# Patient Record
Sex: Female | Born: 1970 | Race: White | Hispanic: No | Smoking: Current every day smoker
Health system: Southern US, Community
[De-identification: ages and names within clinical notes are randomized; demographics above are authoritative.]

## PROBLEM LIST (undated history)

## (undated) DIAGNOSIS — I10 Essential (primary) hypertension: Secondary | ICD-10-CM

## (undated) DIAGNOSIS — Z973 Presence of spectacles and contact lenses: Secondary | ICD-10-CM

## (undated) DIAGNOSIS — F1021 Alcohol dependence, in remission: Secondary | ICD-10-CM

## (undated) DIAGNOSIS — Z872 Personal history of diseases of the skin and subcutaneous tissue: Secondary | ICD-10-CM

## (undated) DIAGNOSIS — H53009 Unspecified amblyopia, unspecified eye: Secondary | ICD-10-CM

## (undated) DIAGNOSIS — M1990 Unspecified osteoarthritis, unspecified site: Secondary | ICD-10-CM

## (undated) DIAGNOSIS — E46 Unspecified protein-calorie malnutrition: Secondary | ICD-10-CM

## (undated) DIAGNOSIS — F32A Depression, unspecified: Secondary | ICD-10-CM

## (undated) DIAGNOSIS — F419 Anxiety disorder, unspecified: Secondary | ICD-10-CM

## (undated) DIAGNOSIS — Z972 Presence of dental prosthetic device (complete) (partial): Secondary | ICD-10-CM

## (undated) DIAGNOSIS — K219 Gastro-esophageal reflux disease without esophagitis: Secondary | ICD-10-CM

## (undated) DIAGNOSIS — L308 Other specified dermatitis: Secondary | ICD-10-CM

## (undated) DIAGNOSIS — N83209 Unspecified ovarian cyst, unspecified side: Secondary | ICD-10-CM

## (undated) DIAGNOSIS — G629 Polyneuropathy, unspecified: Secondary | ICD-10-CM

## (undated) DIAGNOSIS — M792 Neuralgia and neuritis, unspecified: Secondary | ICD-10-CM

## (undated) DIAGNOSIS — K7460 Unspecified cirrhosis of liver: Secondary | ICD-10-CM

## (undated) DIAGNOSIS — S92309A Fracture of unspecified metatarsal bone(s), unspecified foot, initial encounter for closed fracture: Secondary | ICD-10-CM

## (undated) HISTORY — DX: Unspecified protein-calorie malnutrition: E46

## (undated) HISTORY — DX: Anxiety disorder, unspecified: F41.9

## (undated) HISTORY — PX: ORIF FOOT FRACTURE: SHX2123

## (undated) HISTORY — PX: BREAST BIOPSY: SHX20

## (undated) HISTORY — DX: Unspecified ovarian cyst, unspecified side: N83.209

## (undated) HISTORY — PX: TUBAL LIGATION: SHX77

## (undated) HISTORY — DX: Unspecified amblyopia, unspecified eye: H53.009

## (undated) HISTORY — DX: Other specified dermatitis: L30.8

## (undated) HISTORY — DX: Essential (primary) hypertension: I10

## (undated) HISTORY — DX: Neuralgia and neuritis, unspecified: M79.2

## (undated) HISTORY — DX: Alcohol dependence, in remission: F10.21

## (undated) HISTORY — DX: Unspecified cirrhosis of liver: K74.60

---

## 2000-09-12 HISTORY — PX: ABDOMINAL HYSTERECTOMY: SHX81

## 2000-09-12 HISTORY — PX: TUBAL LIGATION: SHX77

## 2006-07-19 ENCOUNTER — Emergency Department (HOSPITAL_COMMUNITY): Admission: EM | Admit: 2006-07-19 | Discharge: 2006-07-20 | Payer: Self-pay | Admitting: Emergency Medicine

## 2006-07-28 ENCOUNTER — Emergency Department (HOSPITAL_COMMUNITY): Admission: EM | Admit: 2006-07-28 | Discharge: 2006-07-28 | Payer: Self-pay | Admitting: Emergency Medicine

## 2008-03-07 ENCOUNTER — Emergency Department (HOSPITAL_COMMUNITY): Admission: EM | Admit: 2008-03-07 | Discharge: 2008-03-07 | Payer: Self-pay | Admitting: Emergency Medicine

## 2008-03-25 ENCOUNTER — Encounter: Admission: RE | Admit: 2008-03-25 | Discharge: 2008-03-25 | Payer: Self-pay | Admitting: General Surgery

## 2013-11-19 ENCOUNTER — Emergency Department (HOSPITAL_COMMUNITY): Payer: Medicaid Other

## 2013-11-19 ENCOUNTER — Emergency Department (HOSPITAL_COMMUNITY)
Admission: EM | Admit: 2013-11-19 | Discharge: 2013-11-19 | Disposition: A | Discharge status: Home / Self Care | Payer: Medicaid Other | Attending: Emergency Medicine | Admitting: Emergency Medicine

## 2013-11-19 ENCOUNTER — Encounter (HOSPITAL_COMMUNITY): Payer: Self-pay | Admitting: Emergency Medicine

## 2013-11-19 DIAGNOSIS — M79609 Pain in unspecified limb: Secondary | ICD-10-CM | POA: Insufficient documentation

## 2013-11-19 DIAGNOSIS — M7989 Other specified soft tissue disorders: Secondary | ICD-10-CM | POA: Insufficient documentation

## 2013-11-19 DIAGNOSIS — R7309 Other abnormal glucose: Secondary | ICD-10-CM | POA: Insufficient documentation

## 2013-11-19 DIAGNOSIS — R0602 Shortness of breath: Secondary | ICD-10-CM | POA: Insufficient documentation

## 2013-11-19 DIAGNOSIS — Z79899 Other long term (current) drug therapy: Secondary | ICD-10-CM | POA: Insufficient documentation

## 2013-11-19 DIAGNOSIS — R079 Chest pain, unspecified: Secondary | ICD-10-CM | POA: Insufficient documentation

## 2013-11-19 DIAGNOSIS — R030 Elevated blood-pressure reading, without diagnosis of hypertension: Secondary | ICD-10-CM | POA: Insufficient documentation

## 2013-11-19 DIAGNOSIS — R200 Anesthesia of skin: Secondary | ICD-10-CM

## 2013-11-19 DIAGNOSIS — Z3202 Encounter for pregnancy test, result negative: Secondary | ICD-10-CM | POA: Insufficient documentation

## 2013-11-19 DIAGNOSIS — R739 Hyperglycemia, unspecified: Secondary | ICD-10-CM

## 2013-11-19 DIAGNOSIS — R209 Unspecified disturbances of skin sensation: Secondary | ICD-10-CM | POA: Insufficient documentation

## 2013-11-19 DIAGNOSIS — F17200 Nicotine dependence, unspecified, uncomplicated: Secondary | ICD-10-CM | POA: Insufficient documentation

## 2013-11-19 DIAGNOSIS — IMO0001 Reserved for inherently not codable concepts without codable children: Secondary | ICD-10-CM | POA: Insufficient documentation

## 2013-11-19 DIAGNOSIS — E876 Hypokalemia: Secondary | ICD-10-CM | POA: Insufficient documentation

## 2013-11-19 LAB — URINALYSIS, ROUTINE W REFLEX MICROSCOPIC
Bilirubin Urine: NEGATIVE
Glucose, UA: NEGATIVE mg/dL
Hgb urine dipstick: NEGATIVE
Ketones, ur: 15 mg/dL — AB
Nitrite: NEGATIVE
Protein, ur: NEGATIVE mg/dL
Specific Gravity, Urine: 1.033 — ABNORMAL HIGH (ref 1.005–1.030)
Urobilinogen, UA: 1 mg/dL (ref 0.0–1.0)
pH: 6 (ref 5.0–8.0)

## 2013-11-19 LAB — PREGNANCY, URINE: Preg Test, Ur: NEGATIVE

## 2013-11-19 LAB — BASIC METABOLIC PANEL
BUN: 9 mg/dL (ref 6–23)
CO2: 27 mEq/L (ref 19–32)
Calcium: 9 mg/dL (ref 8.4–10.5)
Chloride: 84 mEq/L — ABNORMAL LOW (ref 96–112)
Creatinine, Ser: 0.49 mg/dL — ABNORMAL LOW (ref 0.50–1.10)
GFR calc Af Amer: >90 mL/min (ref >90)
GFR calc non Af Amer: >90 mL/min (ref >90)
Glucose, Bld: 160 mg/dL — ABNORMAL HIGH (ref 70–99)
Potassium: 3.1 mEq/L — ABNORMAL LOW (ref 3.7–5.3)
Sodium: 132 mEq/L — ABNORMAL LOW (ref 137–147)

## 2013-11-19 LAB — D-DIMER, QUANTITATIVE (NOT AT ARMC): D-Dimer, Quant: 0.37 ug/mL-FEU (ref 0.00–0.48)

## 2013-11-19 LAB — URINE MICROSCOPIC-ADD ON

## 2013-11-19 LAB — CBC
HCT: 34.3 % — ABNORMAL LOW (ref 36.0–46.0)
Hemoglobin: 12 g/dL (ref 12.0–15.0)
MCH: 35.9 pg — ABNORMAL HIGH (ref 26.0–34.0)
MCHC: 35 g/dL (ref 30.0–36.0)
MCV: 102.7 fL — ABNORMAL HIGH (ref 78.0–100.0)
Platelets: 257 10*3/uL (ref 150–400)
RBC: 3.34 MIL/uL — ABNORMAL LOW (ref 3.87–5.11)
RDW: 15.6 % — ABNORMAL HIGH (ref 11.5–15.5)
WBC: 17.6 10*3/uL — ABNORMAL HIGH (ref 4.0–10.5)

## 2013-11-19 LAB — I-STAT TROPONIN, ED: Troponin i, poc: 0.01 ng/mL (ref 0.00–0.08)

## 2013-11-19 MED ORDER — GABAPENTIN (ONCE-DAILY) 300 MG PO TABS: 300.0000 mg | ORAL_TABLET | Freq: Once | ORAL | Status: DC

## 2013-11-19 MED ORDER — TRAMADOL HCL 50 MG PO TABS: 50.0000 mg | ORAL_TABLET | Freq: Four times a day (QID) | ORAL | Status: DC | PRN

## 2013-11-19 MED ORDER — POTASSIUM CHLORIDE CRYS ER 20 MEQ PO TBCR
40.0000 meq | EXTENDED_RELEASE_TABLET | Freq: Two times a day (BID) | ORAL | Status: DC
  Administered 2013-11-19: 40 meq via ORAL
  Filled 2013-11-19: qty 2

## 2013-11-19 MED ORDER — MORPHINE SULFATE 4 MG/ML IJ SOLN
6.0000 mg | Freq: Once | INTRAMUSCULAR | Status: AC
  Administered 2013-11-19: 6 mg via INTRAVENOUS
  Filled 2013-11-19: qty 2

## 2013-11-19 NOTE — ED Notes (Signed)
Pt presents with intermittent mid-sternum chest pain x2 weeks, SOB and bilateral leg and foot burning x3 weeks, increase SOB with activity

## 2013-11-19 NOTE — ED Provider Notes (Signed)
CSN: [id number]     Arrival date & time 11/19/13  1225 History   First MD Initiated Contact with Patient 11/19/13 1520     Chief Complaint  Patient presents with  . Chest Pain  . Leg Pain  . Shortness of Breath     (Consider location/radiation/quality/duration/timing/severity/associated sxs/prior Treatment) HPI Pt is a 43yo female with no known significant PMH, however, pt has not seen PCP in 10 years, presenting to ED c/o chest pain, leg pain, and SOB.  Reports leg pain is constant, burning, numb aching sensation, 10/10 in severity, in both feet.  Reports associated swelling of ankles bilaterally over last 2 weeks. Pt also c/o centralized chest pain that is sharp in nature, worse with deep breathing, 6/10, but only when she takes a deep breath.  Pt has also noticed increased SOB over last week with exertion. Denies hx of same. Pt states she is a smoker, 1ppd.  She has tried Aleve for leg pain that provided minimal relief. Denies SOB at rest. Denies hx of asthma as a child. Denies diaphoresis, fever, n/v/d. Denies abdominal pain, urinary or vaginal symptoms. Reports having her tubes tied, pt is not on birth control. Denies hx of blood clots or long travel. Denies unilateral leg pain or swelling, symptoms are equal in both legs.  Reports family hx of heart disease, her uncle had an MI before age of 37yo.  No hx of DM. Pt does not take any daily medications or supplements.   History reviewed. No pertinent past medical history. History reviewed. No pertinent past surgical history. History reviewed. No pertinent family history. History  Substance Use Topics  . Smoking status: Current Every Day Smoker  . Smokeless tobacco: Not on file  . Alcohol Use: Yes   OB History   Grav Para Term Preterm Abortions TAB SAB Ect Mult Living                 Review of Systems  Constitutional: Negative for fever and chills.  Respiratory: Positive for shortness of breath. Negative for wheezing and stridor.    Cardiovascular: Positive for chest pain and leg swelling. Negative for palpitations.  Gastrointestinal: Negative for nausea, vomiting and abdominal pain.  Musculoskeletal: Positive for myalgias. Negative for back pain, neck pain and neck stiffness.  Skin: Negative for color change.  Neurological: Positive for numbness ( bottom of both feet).  All other systems reviewed and are negative.      Allergies  Review of patient's allergies indicates no known allergies.  Home Medications   Current Outpatient Rx  Name  Route  Sig  Dispense  Refill  . Multiple Vitamins-Minerals (MULTIVITAMIN WITH MINERALS) tablet   Oral   Take 1 tablet by mouth daily.         . naproxen sodium (ANAPROX) 220 MG tablet   Oral   Take 220 mg by mouth 2 (two) times daily as needed (for pain).         . Gabapentin, PHN, 300 MG TABS   Oral   Take 300 mg by mouth once. Day 1: 300 mg (1 tab), Day 2: 300 mg twice daily, Day 3: 300 mg 3 times daily; dose may be titrated as needed for pain relief (range: 1,800 to 3,600 mg/day)   20 tablet   0   . traMADol (ULTRAM) 50 MG tablet   Oral   Take 1 tablet (50 mg total) by mouth every 6 (six) hours as needed.   15 tablet   0  BP 163/102  Pulse 89  Temp(Src) 97.7 F (36.5 C) (Oral)  Resp 13  Wt 123 lb 1.6 oz (55.838 kg)  SpO2 100%  LMP 10/22/2013 Physical Exam  Nursing note and vitals reviewed. Constitutional: She is oriented to person, place, and time. She appears well-developed and well-nourished. No distress.  Pt lying comfortably in exam bed, NAD.   HENT:  Head: Normocephalic and atraumatic.  Eyes: Conjunctivae and EOM are normal. Pupils are equal, round, and reactive to light. No scleral icterus.  Neck: Normal range of motion. Neck supple.  Cardiovascular: Normal rate, regular rhythm and normal heart sounds.   Pedal pulse 2+   Pulmonary/Chest: Effort normal and breath sounds normal. No respiratory distress. She has no wheezes. She has no rales.  She exhibits no tenderness.  No respiratory distress, able to speak in full sentences w/o difficulty. Lungs: CTAB. No chest wall tenderness  Abdominal: Soft. Bowel sounds are normal. She exhibits no distension and no mass. There is no tenderness. There is no rebound and no guarding.  Musculoskeletal: Normal range of motion. She exhibits tenderness. She exhibits no edema.  Neurological: She is alert and oriented to person, place, and time. She has normal strength. No cranial nerve deficit. She displays a negative Romberg sign. Coordination normal.  CN II-XII grossly in tact, negative romberg. Normal sensation to light and sharp touch on face.  Feet-hypersensitivity to bottom of feet bilaterally.  No erythema, warmth, or edema.   Skin: Skin is warm and dry. She is not diaphoretic. No erythema.    ED Course  Procedures (including critical care time) Labs Review Labs Reviewed  CBC - Abnormal; Notable for the following:    WBC 17.6 (*)    RBC 3.34 (*)    HCT 34.3 (*)    MCV 102.7 (*)    MCH 35.9 (*)    RDW 15.6 (*)    All other components within normal limits  BASIC METABOLIC PANEL - Abnormal; Notable for the following:    Sodium 132 (*)    Potassium 3.1 (*)    Chloride 84 (*)    Glucose, Bld 160 (*)    Creatinine, Ser 0.49 (*)    All other components within normal limits  URINALYSIS, ROUTINE W REFLEX MICROSCOPIC - Abnormal; Notable for the following:    Color, Urine AMBER (*)    APPearance TURBID (*)    Specific Gravity, Urine 1.033 (*)    Ketones, ur 15 (*)    Leukocytes, UA SMALL (*)    All other components within normal limits  URINE MICROSCOPIC-ADD ON - Abnormal; Notable for the following:    Squamous Epithelial / LPF FEW (*)    All other components within normal limits  D-DIMER, QUANTITATIVE  PREGNANCY, URINE  I-STAT TROPOININ, ED   Imaging Review Dg Chest 2 View  11/19/2013   CLINICAL DATA Chest pain.  EXAM CHEST  2 VIEW  COMPARISON None.  FINDINGS No cardiomegaly.  Unremarkable mediastinal contours. No infiltrate, edema, effusion, or pneumothorax. There is an apparent 22 mm long lucency in the posterior right fifth rib. This could be related to a true expansile bone lesion or summation of lung markings.  IMPRESSION 1. No active cardiopulmonary disease. 2. Questionable expansile bone lesion in the posterior right fifth rib. Recommend dedicated rib series for confirmation (would like to exclude CT radiation due to young age).  SIGNATURE  Electronically Signed   By: Jorje Guild M.D.   On: 11/19/2013 13:27     EKG  Interpretation None      MDM   Final diagnoses:  Elevated blood pressure  Hyperglycemia  Numbness in feet  Chest pain  SOB (shortness of breath)  Hypokalemia    Pt is a 43yo female presenting with multiple complaints, specifically chest pain, SOB, and bilateral leg pain x2-3 weeks. Pt states she has not seen a PCP in 10 years. Denies known significant PMH. Does not take any daily medication. Denies recent illness including fever, n/v/d. Pt is a smoker, denies hx of asthma.  DDx: ACS, PE, Pneumonia, HTN, hyperglycemia due to c/o bilateral foot numbness. Vitals: afebrile, elevated BP, O2-100% on RA, HR and RR normal.  Labs-CBC: elevated WBC-17.6 BMP-hypokalemia: 3.1, hyperglycemia-160 (pt has not eaten today) UA: unremarkable CXR: no active cardiopulmonary disease  D-dimer: unremarkable.  Do not believe emergent process taking place at this time. Discussed importance of f/u with PCP, resource guide provided.  Advised pt needs to discuss with PCP elevated BP and hyperglycemia.  All labs/imaging/findings discussed with patient. All questions answered and concerns addressed. Will discharge pt home and have pt f/u with PCP. Return precautions given. Rx: gabapentin and tramadol given for foot pain. Pt verbalized understanding and agreement with tx plan. Vitals: unremarkable. Discharged in stable condition.    Discussed pt with attending during  ED encounter and agrees with plan.        Noland Fordyce, PA-C 11/20/13 [phone]

## 2013-11-19 NOTE — ED Notes (Signed)
Patient transported to X-ray 

## 2013-11-19 NOTE — Discharge Instructions (Signed)
Emergency Department Resource Guide 1) Find a Doctor and Pay Out of Pocket Although you won't have to find out who is covered by your insurance plan, it is a good idea to ask around and get recommendations. You will then need to call the office and see if the doctor you have chosen will accept you as a new patient and what types of options they offer for patients who are self-pay. Some doctors offer discounts or will set up payment plans for their patients who do not have insurance, but you will need to ask so you aren't surprised when you get to your appointment.  2) Contact Your Local Health Department Not all health departments have doctors that can see patients for sick visits, but many do, so it is worth a call to see if yours does. If you don't know where your local health department is, you can check in your phone book. The CDC also has a tool to help you locate your state's health department, and many state websites also have listings of all of their local health departments.  3) Find a [facility] If your illness is not likely to be very severe or complicated, you may want to try a walk in clinic. These are popping up all over the country in pharmacies, drugstores, and shopping centers. They're usually staffed by nurse practitioners or physician assistants that have been trained to treat common illnesses and complaints. They're usually fairly quick and inexpensive. However, if you have serious medical issues or chronic medical problems, these are probably not your best option.  No Primary Care Doctor: - Call Health Connect at  [phone] - they can help you locate a primary care doctor that  accepts your insurance, provides certain services, etc. - Physician Referral Service- [phone]  Chronic Pain Problems: Organization         Address  Phone   Notes  [facility]  [phone] Patients need to be referred by their primary care doctor.   Medication  Assistance: Organization         Address  Phone   Notes  [facility] Medication [facility] [address] [place], [place] [address] [phone] --Must be a resident of [facility] -- Must have NO insurance coverage whatsoever (no Medicaid/ Medicare, etc.) -- The pt. MUST have a primary care doctor that directs their care regularly and follows them in the community   [facility]  [phone]   [employer]  [phone]    Agencies that provide inexpensive medical care: Organization         Address                                                       Phone                                                                            Notes  [place]  [phone]   [facility]    [phone])  [facility] [place], [place] [address] [phone]   [place]. [address], [place] [phone]   [facility]    [phone]   [facility]    [phone]   [place] and [place] [address], [place] Phone:  [phone], Fax:  [phone] Hours of Operation:  9 am - 6 pm, M-F.  Also accepts Medicaid/Medicare and self-pay.  [facility] for [address] [place], [address], [place] Phone: [phone], Fax: [phone]. Hours of Operation:  8:30 am - 5:30 pm, M-F.  Also accepts Medicaid and self-pay.  [facility] [address], [place] Phone: [phone]   [place], [place], [place] [phone], Ext. [phone] Mondays & Thursdays: 7-9 AM.  First 15 patients are seen on a first come, first serve basis.    [place] Providers:  Organization         Address                                                                       Phone                               Notes  [place] [facility] [address], [place] [phone] Also accepts self-pay patients.  [facility] [address], [place]  [phone]   [place], [address], [place] [phone]   [facility] [address] [place] [phone]   Lucianne Lei [address], [place]   [phone] Only accepts [place] Access [place] patients after they have their name applied to their card.   Self-Pay (no insurance) in [facility]:   Organization         Address                                                     Phone               Notes  Sickle Cell Patients, [facility] [place] [phone]   [facility] [place] [phone]   [facility] [place]  [place], [place], [place] [phone]   [facility]/Dr. Osei-Bonsu  [address] [place] or [address], [place] [phone] Phone number for both [place] and [place] locations is the same.  [facility]gent [facility] and [facility] [address] [place] [phone]   [facility] [address] [place] or [address] [phone]   [facility] [place], [place] [phone], phone; [phone], fax Sees patients 1st and 3rd Saturday of  every month.  Must not qualify for public or private insurance (i.e. Medicaid, Medicare, [place] Health Choice, Veterans' Benefits)  Household income should be no more than 200% of the poverty level The clinic cannot treat you if you are pregnant or think you are pregnant  Sexually transmitted diseases are not treated at the clinic.    Dental Care: Organization         Address                                  Phone                       Notes  [facility] Department of [facility] [place] [phone] Accepts children up to age 9 who are enrolled in [place] or [place]; pregnant women with a Medicaid card; and children who have applied for Medicaid or [place] Health Choice, but were declined, whose parents can pay a reduced fee at time of service.  [facility] Department of [facility]  [address], [place] [phone] Accepts children up to age 24 who are enrolled in [place] or [place]; pregnant women with a Medicaid card; and children who have applied for Medicaid or [place] Health Choice, but were declined, whose parents can pay a reduced fee at time of service.  [place] [facility] Access PRO[facility]  [place] [phone] Patients are seen by appointment only. Walk-ins are not accepted. [place] will see patients 45 years of age and older. Monday - Tuesday (8am-5pm) Most Wednesdays (8:30-5pm) $30 per visit, cash only  [facility] Adult Dental Access PROGRAM  [address], [facility] [phone] Patients are seen by appointment only. Walk-ins are not accepted. [place] will see patients 27 years of age and older. One Wednesday Evening (Monthly: Volunteer Based).  $30 per visit, cash only  [place]  [phone] for adults; Children under age 15, call Graduate Pediatric Dentistry at [phone]. Children aged 75-14, please call [phone] to request a pediatric application.  Dental services are provided in all areas of dental care including fillings, crowns and bridges, complete and partial dentures, implants, gum treatment, root canals, and extractions. Preventive care is also provided. Treatment is provided to both adults and children. Patients are selected via a lottery and there is often a waiting list.   [facility] [address], [place]  [phone] [email or web address]   [facility] [address] [place], [place] [phone], Ext. [phone] Second and Fourth Thursday of each month, opens at 6:30 AM; Clinic ends at 9 AM.  Patients are seen on a first-come first-served basis, and a limited number are seen during each clinic.   [facility]  [address] [place], [place] [phone]   Eligibility Requirements You must have lived in [place], [place], or [place] counties for at least the last three months.   You cannot be eligible for state or federal sponsored [employer], including [employer], [place], or [employer].   You generally cannot be eligible for healthcare insurance through your employer.    How to apply: Eligibility screenings are held every Tuesday and Wednesday afternoon from 1:00 pm until 4:00 pm. You do not need an appointment for the interview!  [facility] [address], [place], [place] [phone]   °[facility]  [phone]   °[facility]  [phone]   ° [facility]  [phone]   ° °

## 2013-11-19 NOTE — ED Notes (Signed)
Erin, PA at bedside. 

## 2013-11-24 NOTE — ED Provider Notes (Signed)
Medical screening examination/treatment/procedure(s) were performed by non-physician practitioner and as supervising physician I was immediately available for consultation/collaboration.   EKG Interpretation   Date/Time:  Tuesday November 19 2013 12:34:00 EDT Ventricular Rate:  89 PR Interval:  114 QRS Duration: 86 QT Interval:  404 QTC Calculation: 491 R Axis:   95 Text Interpretation:  Normal sinus rhythm Rightward axis ST \\T \ T wave  abnormality, consider inferior ischemia ST \\T \ T wave abnormality,  consider anterolateral ischemia Prolonged QT Reconfirmed by Alvino Chapel  MD,  Evolet Salminen [phone]) on 11/23/2013 7:05:36 AM       Jasper Riling. Alvino Chapel, MD 11/24/13 1517

## 2013-11-29 ENCOUNTER — Encounter (HOSPITAL_COMMUNITY): Payer: Self-pay | Admitting: Emergency Medicine

## 2013-11-29 ENCOUNTER — Emergency Department (HOSPITAL_COMMUNITY)
Admission: EM | Admit: 2013-11-29 | Discharge: 2013-11-29 | Disposition: A | Discharge status: Home / Self Care | Payer: No Typology Code available for payment source | Source: Home / Self Care | Attending: Family Medicine | Admitting: Family Medicine

## 2013-11-29 DIAGNOSIS — M79609 Pain in unspecified limb: Secondary | ICD-10-CM

## 2013-11-29 DIAGNOSIS — M79606 Pain in leg, unspecified: Secondary | ICD-10-CM

## 2013-11-29 LAB — POCT I-STAT, CHEM 8
BUN: 4 mg/dL — ABNORMAL LOW (ref 6–23)
Calcium, Ion: 1.12 mmol/L (ref 1.12–1.23)
Chloride: 89 mEq/L — ABNORMAL LOW (ref 96–112)
Creatinine, Ser: 0.6 mg/dL (ref 0.50–1.10)
Glucose, Bld: 116 mg/dL — ABNORMAL HIGH (ref 70–99)
HCT: 43 % (ref 36.0–46.0)
Hemoglobin: 14.6 g/dL (ref 12.0–15.0)
Potassium: 3.7 mEq/L (ref 3.7–5.3)
Sodium: 132 mEq/L — ABNORMAL LOW (ref 137–147)
TCO2: 30 mmol/L (ref 0–100)

## 2013-11-29 MED ORDER — HYDROCODONE-ACETAMINOPHEN 5-325 MG PO TABS: 2.0000 | ORAL_TABLET | ORAL | Status: DC | PRN

## 2013-11-29 NOTE — ED Provider Notes (Signed)
CSN: [id number]     Arrival date & time 11/29/13  1516 History   First MD Initiated Contact with Patient 11/29/13 1714     Chief Complaint  Patient presents with  . Leg Pain   (Consider location/radiation/quality/duration/timing/severity/associated sxs/prior Treatment) Patient is a 43 y.o. female presenting with leg pain. The history is provided by the patient. No language interpreter was used.  Leg Pain Location:  Leg Injury: no   Leg location:  L leg and R leg Pain details:    Quality:  Aching   Radiates to:  Does not radiate   Severity:  No pain   Onset quality:  Gradual   Duration:  3 weeks   Timing:  Constant   Progression:  Worsening Chronicity:  New Dislocation: no   Tetanus status:  Out of date Prior injury to area:  No Worsened by:  Nothing tried Ineffective treatments:  None tried Associated symptoms: swelling and tingling     History reviewed. No pertinent past medical history. History reviewed. No pertinent past surgical history. History reviewed. No pertinent family history. History  Substance Use Topics  . Smoking status: Current Every Day Smoker  . Smokeless tobacco: Not on file  . Alcohol Use: Yes   OB History   Grav Para Term Preterm Abortions TAB SAB Ect Mult Living                 Review of Systems  Musculoskeletal: Positive for joint swelling and myalgias.  All other systems reviewed and are negative.    Allergies  Review of patient's allergies indicates no known allergies.  Home Medications   Current Outpatient Rx  Name  Route  Sig  Dispense  Refill  . Gabapentin, PHN, 300 MG TABS   Oral   Take 300 mg by mouth once. Day 1: 300 mg (1 tab), Day 2: 300 mg twice daily, Day 3: 300 mg 3 times daily; dose may be titrated as needed for pain relief (range: 1,800 to 3,600 mg/day)   20 tablet   0   . HYDROcodone-acetaminophen (NORCO/VICODIN) 5-325 MG per tablet   Oral   Take 2 tablets by mouth every 4 (four) hours as needed for moderate  pain.   20 tablet   0   . Multiple Vitamins-Minerals (MULTIVITAMIN WITH MINERALS) tablet   Oral   Take 1 tablet by mouth daily.         . naproxen sodium (ANAPROX) 220 MG tablet   Oral   Take 220 mg by mouth 2 (two) times daily as needed (for pain).         . traMADol (ULTRAM) 50 MG tablet   Oral   Take 1 tablet (50 mg total) by mouth every 6 (six) hours as needed.   15 tablet   0    BP 171/101  Pulse 95  Temp(Src) 98.2 F (36.8 C) (Oral)  Resp 16  SpO2 100%  LMP 11/03/2013 Physical Exam  Constitutional: She appears well-developed and well-nourished.  HENT:  Head: Normocephalic and atraumatic.  Eyes: Pupils are equal, round, and reactive to light.  Neck: Normal range of motion.  Cardiovascular: Normal rate and normal heart sounds.   Pulmonary/Chest: Effort normal.  Abdominal: Soft.  Musculoskeletal: She exhibits tenderness.  Neurological: She is alert.  Skin: Skin is warm.    ED Course  Procedures (including critical care time) Labs Review Labs Reviewed  POCT I-STAT, CHEM 8 - Abnormal; Notable for the following:    Sodium 132 (*)  Chloride 89 (*)    BUN 4 (*)    Glucose, Bld 116 (*)    All other components within normal limits   Imaging Review No results found.   MDM   1. Leg pain    Pt given rx for hydrocodone for pain.   Pt advised to follow up with Wellness center    Fransico Meadow, [place] 11/29/13 1827   Medical screening examination/treatment/procedure(s) were performed by a resident physician or non-physician practitioner and as the supervising physician I was immediately available for consultation/collaboration.  Lynne Leader, MD    Gregor Hams, MD 11/30/13 [phone]

## 2013-11-29 NOTE — ED Notes (Signed)
Pt  Reports  Symptoms  Of  Leg    Pain         And   Swelling  Of  Both feet  As  Well   As  Pain  When  She  Bears  Weight            She  Was  Seen  Er  10  Days  Ago  And  Had  A    Work  Up         Pt  Reports  Feet  Continue  To        Cause  Her  Pain pt  States  She  Received  ornga  Card    Today  And   Her plan is  To  followup  With  [employer]

## 2013-11-29 NOTE — Discharge Instructions (Signed)

## 2013-12-18 ENCOUNTER — Inpatient Hospital Stay (HOSPITAL_COMMUNITY): Payer: Medicaid Other

## 2013-12-18 ENCOUNTER — Inpatient Hospital Stay (HOSPITAL_COMMUNITY)
Admission: EM | Admit: 2013-12-18 | Discharge: 2013-12-27 | DRG: 041 | Disposition: A | Discharge status: Skilled Nursing Facility | Payer: Medicaid Other | Attending: Internal Medicine | Admitting: Internal Medicine

## 2013-12-18 ENCOUNTER — Encounter: Payer: Self-pay | Admitting: Internal Medicine

## 2013-12-18 ENCOUNTER — Ambulatory Visit: Payer: No Typology Code available for payment source | Attending: Internal Medicine | Admitting: Internal Medicine

## 2013-12-18 ENCOUNTER — Encounter (HOSPITAL_COMMUNITY): Payer: Self-pay | Admitting: Emergency Medicine

## 2013-12-18 VITALS — BP 160/103 | HR 88 | Temp 97.5°F | Resp 16

## 2013-12-18 DIAGNOSIS — G629 Polyneuropathy, unspecified: Secondary | ICD-10-CM

## 2013-12-18 DIAGNOSIS — I252 Old myocardial infarction: Secondary | ICD-10-CM

## 2013-12-18 DIAGNOSIS — K0530 Chronic periodontitis, unspecified: Secondary | ICD-10-CM | POA: Diagnosis present

## 2013-12-18 DIAGNOSIS — M79604 Pain in right leg: Secondary | ICD-10-CM

## 2013-12-18 DIAGNOSIS — M270 Developmental disorders of jaws: Secondary | ICD-10-CM | POA: Diagnosis present

## 2013-12-18 DIAGNOSIS — I2789 Other specified pulmonary heart diseases: Secondary | ICD-10-CM | POA: Diagnosis present

## 2013-12-18 DIAGNOSIS — L0201 Cutaneous abscess of face: Secondary | ICD-10-CM | POA: Diagnosis present

## 2013-12-18 DIAGNOSIS — K0401 Reversible pulpitis: Secondary | ICD-10-CM | POA: Diagnosis present

## 2013-12-18 DIAGNOSIS — M79605 Pain in left leg: Secondary | ICD-10-CM

## 2013-12-18 DIAGNOSIS — K045 Chronic apical periodontitis: Secondary | ICD-10-CM | POA: Diagnosis present

## 2013-12-18 DIAGNOSIS — D72829 Elevated white blood cell count, unspecified: Secondary | ICD-10-CM | POA: Diagnosis present

## 2013-12-18 DIAGNOSIS — I10 Essential (primary) hypertension: Secondary | ICD-10-CM | POA: Diagnosis present

## 2013-12-18 DIAGNOSIS — G609 Hereditary and idiopathic neuropathy, unspecified: Secondary | ICD-10-CM

## 2013-12-18 DIAGNOSIS — G608 Other hereditary and idiopathic neuropathies: Secondary | ICD-10-CM | POA: Insufficient documentation

## 2013-12-18 DIAGNOSIS — K029 Dental caries, unspecified: Secondary | ICD-10-CM | POA: Diagnosis present

## 2013-12-18 DIAGNOSIS — Z79899 Other long term (current) drug therapy: Secondary | ICD-10-CM

## 2013-12-18 DIAGNOSIS — M79609 Pain in unspecified limb: Secondary | ICD-10-CM

## 2013-12-18 DIAGNOSIS — R6510 Systemic inflammatory response syndrome (SIRS) of non-infectious origin without acute organ dysfunction: Secondary | ICD-10-CM | POA: Diagnosis present

## 2013-12-18 DIAGNOSIS — E860 Dehydration: Secondary | ICD-10-CM

## 2013-12-18 DIAGNOSIS — M264 Malocclusion, unspecified: Secondary | ICD-10-CM | POA: Diagnosis present

## 2013-12-18 DIAGNOSIS — E871 Hypo-osmolality and hyponatremia: Secondary | ICD-10-CM

## 2013-12-18 DIAGNOSIS — F17200 Nicotine dependence, unspecified, uncomplicated: Secondary | ICD-10-CM | POA: Diagnosis present

## 2013-12-18 DIAGNOSIS — L03211 Cellulitis of face: Secondary | ICD-10-CM

## 2013-12-18 DIAGNOSIS — K7689 Other specified diseases of liver: Secondary | ICD-10-CM | POA: Diagnosis present

## 2013-12-18 DIAGNOSIS — F1010 Alcohol abuse, uncomplicated: Secondary | ICD-10-CM | POA: Diagnosis present

## 2013-12-18 DIAGNOSIS — K083 Retained dental root: Secondary | ICD-10-CM | POA: Diagnosis present

## 2013-12-18 DIAGNOSIS — Z23 Encounter for immunization: Secondary | ICD-10-CM

## 2013-12-18 DIAGNOSIS — I509 Heart failure, unspecified: Secondary | ICD-10-CM | POA: Diagnosis present

## 2013-12-18 DIAGNOSIS — K047 Periapical abscess without sinus: Secondary | ICD-10-CM | POA: Diagnosis present

## 2013-12-18 DIAGNOSIS — D638 Anemia in other chronic diseases classified elsewhere: Secondary | ICD-10-CM | POA: Diagnosis present

## 2013-12-18 LAB — CBC
HCT: 25.8 % — ABNORMAL LOW (ref 36.0–46.0)
Hemoglobin: 8.8 g/dL — ABNORMAL LOW (ref 12.0–15.0)
MCH: 33.7 pg (ref 26.0–34.0)
MCHC: 34.1 g/dL (ref 30.0–36.0)
MCV: 98.9 fL (ref 78.0–100.0)
Platelets: 366 10*3/uL (ref 150–400)
RBC: 2.61 MIL/uL — ABNORMAL LOW (ref 3.87–5.11)
RDW: 15.3 % (ref 11.5–15.5)
WBC: 22.2 10*3/uL — ABNORMAL HIGH (ref 4.0–10.5)

## 2013-12-18 LAB — COMPREHENSIVE METABOLIC PANEL
ALT: 8 U/L (ref 0–35)
AST: 49 U/L — ABNORMAL HIGH (ref 0–37)
Albumin: 2.6 g/dL — ABNORMAL LOW (ref 3.5–5.2)
Alkaline Phosphatase: 183 U/L — ABNORMAL HIGH (ref 39–117)
BUN: 9 mg/dL (ref 6–23)
CO2: 24 mEq/L (ref 19–32)
Calcium: 8.4 mg/dL (ref 8.4–10.5)
Chloride: 86 mEq/L — ABNORMAL LOW (ref 96–112)
Creatinine, Ser: 0.67 mg/dL (ref 0.50–1.10)
GFR calc Af Amer: >90 mL/min (ref >90)
GFR calc non Af Amer: >90 mL/min (ref >90)
Glucose, Bld: 104 mg/dL — ABNORMAL HIGH (ref 70–99)
Potassium: 3.7 mEq/L (ref 3.7–5.3)
Sodium: 129 mEq/L — ABNORMAL LOW (ref 137–147)
Total Bilirubin: 0.6 mg/dL (ref 0.3–1.2)
Total Protein: 6.1 g/dL (ref 6.0–8.3)

## 2013-12-18 LAB — CBC WITH DIFFERENTIAL/PLATELET
Basophils Absolute: 0.1 10*3/uL (ref 0.0–0.1)
Basophils Relative: 0 % (ref 0–1)
Eosinophils Absolute: 0.1 10*3/uL (ref 0.0–0.7)
Eosinophils Relative: 0 % (ref 0–5)
HCT: 28.7 % — ABNORMAL LOW (ref 36.0–46.0)
Hemoglobin: 10.2 g/dL — ABNORMAL LOW (ref 12.0–15.0)
Lymphocytes Relative: 11 % — ABNORMAL LOW (ref 12–46)
Lymphs Abs: 2.8 10*3/uL (ref 0.7–4.0)
MCH: 35.1 pg — ABNORMAL HIGH (ref 26.0–34.0)
MCHC: 35.5 g/dL (ref 30.0–36.0)
MCV: 98.6 fL (ref 78.0–100.0)
Monocytes Absolute: 2.1 10*3/uL — ABNORMAL HIGH (ref 0.1–1.0)
Monocytes Relative: 9 % (ref 3–12)
Neutro Abs: 19.2 10*3/uL — ABNORMAL HIGH (ref 1.7–7.7)
Neutrophils Relative %: 80 % — ABNORMAL HIGH (ref 43–77)
Platelets: 397 10*3/uL (ref 150–400)
RBC: 2.91 MIL/uL — ABNORMAL LOW (ref 3.87–5.11)
RDW: 15.4 % (ref 11.5–15.5)
WBC: 24.2 10*3/uL — ABNORMAL HIGH (ref 4.0–10.5)

## 2013-12-18 LAB — SEDIMENTATION RATE: Sed Rate: 5 mm/hr (ref 0–22)

## 2013-12-18 MED ORDER — CLINDAMYCIN PHOSPHATE 600 MG/50ML IV SOLN
600.0000 mg | Freq: Once | INTRAVENOUS | Status: AC
  Administered 2013-12-18: 600 mg via INTRAVENOUS
  Filled 2013-12-18: qty 50

## 2013-12-18 MED ORDER — HEPARIN SODIUM (PORCINE) 5000 UNIT/ML IJ SOLN
5000.0000 [IU] | Freq: Three times a day (TID) | INTRAMUSCULAR | Status: DC
  Administered 2013-12-18 – 2013-12-22 (×12): 5000 [IU] via SUBCUTANEOUS
  Filled 2013-12-18 (×14): qty 1

## 2013-12-18 MED ORDER — PNEUMOCOCCAL VAC POLYVALENT 25 MCG/0.5ML IJ INJ
0.5000 mL | INJECTION | INTRAMUSCULAR | Status: AC
  Administered 2013-12-19: 0.5 mL via INTRAMUSCULAR
  Filled 2013-12-18: qty 0.5

## 2013-12-18 MED ORDER — SODIUM CHLORIDE 0.9 % IV SOLN
INTRAVENOUS | Status: AC
  Administered 2013-12-18 – 2013-12-19 (×2): via INTRAVENOUS

## 2013-12-18 MED ORDER — IOHEXOL 300 MG/ML  SOLN
80.0000 mL | Freq: Once | INTRAMUSCULAR | Status: AC | PRN
  Administered 2013-12-18: 80 mL via INTRAVENOUS

## 2013-12-18 MED ORDER — MORPHINE SULFATE 2 MG/ML IJ SOLN
2.0000 mg | INTRAMUSCULAR | Status: DC | PRN
  Administered 2013-12-18 – 2013-12-22 (×19): 2 mg via INTRAVENOUS
  Filled 2013-12-18 (×19): qty 1

## 2013-12-18 MED ORDER — HYDROCODONE-ACETAMINOPHEN 5-325 MG PO TABS
2.0000 | ORAL_TABLET | ORAL | Status: DC | PRN
  Administered 2013-12-18 – 2013-12-27 (×27): 2 via ORAL
  Filled 2013-12-18 (×29): qty 2

## 2013-12-18 MED ORDER — ONDANSETRON HCL 4 MG/2ML IJ SOLN: 4.0000 mg | Freq: Four times a day (QID) | INTRAMUSCULAR | Status: DC | PRN

## 2013-12-18 MED ORDER — POLYETHYLENE GLYCOL 3350 17 G PO PACK
17.0000 g | PACK | Freq: Every day | ORAL | Status: DC | PRN
  Filled 2013-12-18: qty 1

## 2013-12-18 MED ORDER — VANCOMYCIN HCL IN DEXTROSE 750-5 MG/150ML-% IV SOLN
750.0000 mg | Freq: Two times a day (BID) | INTRAVENOUS | Status: DC
  Administered 2013-12-19 – 2013-12-22 (×6): 750 mg via INTRAVENOUS
  Filled 2013-12-18 (×9): qty 150

## 2013-12-18 MED ORDER — ONDANSETRON HCL 4 MG/2ML IJ SOLN
4.0000 mg | Freq: Once | INTRAMUSCULAR | Status: AC
  Administered 2013-12-18: 4 mg via INTRAVENOUS
  Filled 2013-12-18: qty 2

## 2013-12-18 MED ORDER — WHITE PETROLATUM GEL
Status: AC
  Administered 2013-12-18: 0.2
  Filled 2013-12-18: qty 5

## 2013-12-18 MED ORDER — GABAPENTIN 300 MG PO CAPS
300.0000 mg | ORAL_CAPSULE | Freq: Once | ORAL | Status: AC
Start: 2013-12-18 — End: 2013-12-18
  Administered 2013-12-18: 300 mg via ORAL
  Filled 2013-12-18: qty 1

## 2013-12-18 MED ORDER — ONDANSETRON HCL 4 MG PO TABS
4.0000 mg | ORAL_TABLET | Freq: Four times a day (QID) | ORAL | Status: DC | PRN
  Administered 2013-12-19: 4 mg via ORAL
  Filled 2013-12-18: qty 1

## 2013-12-18 MED ORDER — ACETAMINOPHEN 650 MG RE SUPP: 650.0000 mg | Freq: Four times a day (QID) | RECTAL | Status: DC | PRN

## 2013-12-18 MED ORDER — SODIUM CHLORIDE 0.9 % IV BOLUS (SEPSIS)
1000.0000 mL | Freq: Once | INTRAVENOUS | Status: AC
  Administered 2013-12-18: 1000 mL via INTRAVENOUS

## 2013-12-18 MED ORDER — MORPHINE SULFATE 4 MG/ML IJ SOLN
6.0000 mg | Freq: Once | INTRAMUSCULAR | Status: AC
  Administered 2013-12-18: 6 mg via INTRAVENOUS
  Filled 2013-12-18: qty 2

## 2013-12-18 MED ORDER — VANCOMYCIN HCL IN DEXTROSE 1-5 GM/200ML-% IV SOLN
1000.0000 mg | INTRAVENOUS | Status: AC
  Administered 2013-12-18: 1000 mg via INTRAVENOUS
  Filled 2013-12-18: qty 200

## 2013-12-18 MED ORDER — ACETAMINOPHEN 325 MG PO TABS: 650.0000 mg | ORAL_TABLET | Freq: Four times a day (QID) | ORAL | Status: DC | PRN

## 2013-12-18 NOTE — Patient Instructions (Signed)
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

## 2013-12-18 NOTE — Progress Notes (Signed)
Pt is here to establish care. Pt was admitted to the hospital last month with swelling in her legs and feet causing her to be unable to walk.

## 2013-12-18 NOTE — ED Provider Notes (Signed)
CSN: [id number]     Arrival date & time 12/18/13  1248 History   First MD Initiated Contact with Patient 12/18/13 1514     Chief Complaint  Patient presents with  . Abscess  . Leg Swelling     (Consider location/radiation/quality/duration/timing/severity/associated sxs/prior Treatment) HPI Comments: Pt with 2 complaints. Abscess on R cheek started 3 days ago, worsening. Tender, swollen, red. Denies f/c, but has some nausea  Also complaining of b/l foot pain. States has foot burning, feels "like I have no skin on my feet." Denies numbness, weakness. Pain is so bad that she cannot walk. Has been going on for a month and progressing. Denies abd pain, chest pain, SOB, HA, numbness, weakenss, back pain, b/b incontinence.  Pt saw PCP today who recommend she get admitted  Patient is a 43 y.o. female presenting with abscess. The history is provided by the patient.  Abscess Location:  Face Facial abscess location:  R cheek Size:  2 cm Abscess quality: painful and redness   Red streaking: yes   Duration:  3 days Progression:  Worsening Pain details:    Quality:  Dull   Severity:  Mild   Duration:  3 days   Timing:  Constant   Progression:  Worsening Chronicity:  New Context: not diabetes and not insect bite/sting   Relieved by:  Nothing Worsened by:  Draining/squeezing Ineffective treatments:  None tried Associated symptoms: nausea   Associated symptoms: no fatigue, no fever, no headaches and no vomiting   Risk factors: no prior abscess     History reviewed. No pertinent past medical history. Past Surgical History  Procedure Laterality Date  . Tubal ligation     Family History  Problem Relation Age of Onset  . Diabetes Father   . Diabetes Maternal Aunt   . Heart disease Maternal Aunt   . Diabetes Maternal Uncle   . Heart disease Maternal Uncle   . Drug abuse Maternal Grandmother   . Cancer Maternal Grandfather   . Hypertension Mother   . Raynaud syndrome Mother     History  Substance Use Topics  . Smoking status: Current Every Day Smoker  . Smokeless tobacco: Not on file  . Alcohol Use: Yes   OB History   Grav Para Term Preterm Abortions TAB SAB Ect Mult Living                 Review of Systems  Constitutional: Negative for fever, activity change, appetite change and fatigue.  HENT: Negative for congestion and rhinorrhea.   Eyes: Negative for discharge, redness and itching.  Respiratory: Negative for cough, shortness of breath and wheezing.   Cardiovascular: Negative for chest pain and palpitations.  Gastrointestinal: Positive for nausea. Negative for vomiting.  Genitourinary: Negative for dysuria and decreased urine volume.  Musculoskeletal: Positive for gait problem.  Skin: Positive for wound. Negative for rash.  Neurological: Negative for dizziness, syncope, weakness, numbness and headaches.      Allergies  Review of patient's allergies indicates no known allergies.  Home Medications   No current outpatient prescriptions on file. BP 130/80  Pulse 95  Temp(Src) 98.3 F (36.8 C) (Oral)  Resp 16  Ht 5\' 5"  (1.651 m)  Wt 127 lb 4.8 oz (57.743 kg)  BMI 21.18 kg/m2  SpO2 99%  LMP 11/03/2013 Physical Exam  Constitutional: She is oriented to person, place, and time. She appears well-developed and well-nourished. No distress.  HENT:  Head: Normocephalic and atraumatic.  Mouth/Throat: Oropharynx is clear and  moist.  2 cm abscess just lateral to R corner of mouth with erythema, ttp, mild fluctuance, some red streaking laterally  Poor dentition throughout with no ttp along gingiva or alveolar ridge  Eyes: Conjunctivae and EOM are normal. Pupils are equal, round, and reactive to light. Right eye exhibits no discharge. Left eye exhibits no discharge. No scleral icterus.  Neck: Normal range of motion. Neck supple.  Cardiovascular: Normal rate, regular rhythm and intact distal pulses.  Exam reveals no gallop and no friction rub.   No  murmur heard. Pulmonary/Chest: Effort normal and breath sounds normal. No respiratory distress. She has no wheezes. She has no rales.  Abdominal: Soft. She exhibits no distension and no mass. There is no tenderness.  Musculoskeletal: Normal range of motion.  Neurological: She is alert and oriented to person, place, and time. She has normal reflexes. No cranial nerve deficit. She exhibits normal muscle tone. Coordination normal.  5/5 strength in all exts, sensation present in all extremities, 2+ DTRs patella and brachioradilias, CN II-XII intact, hypersthesia in b/l feet. Cannot ambulate d/t pain  Skin: She is not diaphoretic.    ED Course  Procedures (including critical care time) Labs Review Labs Reviewed  CBC WITH DIFFERENTIAL - Abnormal; Notable for the following:    WBC 24.2 (*)    RBC 2.91 (*)    Hemoglobin 10.2 (*)    HCT 28.7 (*)    MCH 35.1 (*)    Neutrophils Relative % 80 (*)    Neutro Abs 19.2 (*)    Lymphocytes Relative 11 (*)    Monocytes Absolute 2.1 (*)    All other components within normal limits  COMPREHENSIVE METABOLIC PANEL - Abnormal; Notable for the following:    Sodium 129 (*)    Chloride 86 (*)    Glucose, Bld 104 (*)    Albumin 2.6 (*)    AST 49 (*)    Alkaline Phosphatase 183 (*)    All other components within normal limits  CBC - Abnormal; Notable for the following:    WBC 22.2 (*)    RBC 2.61 (*)    Hemoglobin 8.8 (*)    HCT 25.8 (*)    All other components within normal limits  CULTURE, BLOOD (ROUTINE X 2)  CULTURE, BLOOD (ROUTINE X 2)  RHEUMATOID FACTOR  SEDIMENTATION RATE  RPR  HIV ANTIBODY (ROUTINE TESTING)  CREATININE, SERUM  URINE RAPID DRUG SCREEN (HOSP PERFORMED)  SODIUM, URINE, RANDOM  ANA  VITAMIN B12  COMPREHENSIVE METABOLIC PANEL  CBC  PROTIME-INR  CREATININE, URINE, RANDOM   Imaging Review Ct Maxillofacial W/cm  12/18/2013   CLINICAL DATA:  Right-sided facial pain and swelling.  EXAM: CT MAXILLOFACIAL WITH CONTRAST   TECHNIQUE: Multidetector CT imaging of the maxillofacial structures was performed with intravenous contrast. Multiplanar CT image reconstructions were also generated. A small metallic BB was placed on the right temple in order to reliably differentiate right from left.  CONTRAST:  14mL OMNIPAQUE IOHEXOL 300 MG/ML  SOLN  COMPARISON:  None.  FINDINGS: There is a focal area of subcutaneous soft tissue swelling/ edema involving the right facial area overlying the right mandible and maxilla. No discrete drainable soft tissue abscess.  There is extensive dental disease with dental caries and right-sided periapical lucency involving the mandibular molars. No abnormality in the floor of the mouth. The tongue base is normal.  The parotid and submandibular glands are normal. The epiglottis is normal. The paraglottic fat planes are maintained. The piriform sinus and  vallecular air spaces are normal.  Small scattered bilateral neck nodes but no mass or adenopathy.  The visualized portion of the brain is unremarkable. The paranasal sinuses and mastoid air cells are clear. Globes are intact.  IMPRESSION: Right facial cellulitis without discrete abscess. This is due to periapical abscess involving the right mandibular molar.   Electronically Signed   By: Kalman Jewels M.D.   On: 12/18/2013 18:03     EKG Interpretation None      MDM   MDM: 43 y.o. WF w/ no PMHx, w/ 2 complaints, facial abscess and b/l leg pain. Pt with facial abscess for 3 days, Corner of R mouth. Appears superficial but is TTP, mildly fluctuant, has some associated redness. Leg pain is localized to feet. Has hypersthesias of feet, no neuro deficits. Initially reluctant to ambulate but eventually does but appears to be unsteady. Pt HDS, no neuro deficits on exam. Does not appear septic from abscess. No systemic signs. Will give dose of IV Clinda. Labs checked show leukocytosis, no significant lyte abnormalities. Will admit to hospitalist for further  evaluation and treatment.  Final diagnoses:  Neuropathy  Facial cellulitis  Dehydration   Admit   Sol Passer, MD 12/19/13 [phone]

## 2013-12-18 NOTE — ED Provider Notes (Signed)
Patient seen/examined in the Emergency Department in conjunction with Resident Physician Provider [facility] Patient reports facial pain from abscess and bilateral LE pain for over a month Exam : awake/alert, facial abscess noted, pt with difficulty walking due to significant pain in her feet Plan: labs/imaging ordered.  IV clindamycin ordered   Sharyon Cable, MD 12/18/13 [phone]

## 2013-12-18 NOTE — ED Notes (Signed)
MD got pt to stand. Pt reported that she broke out in a sweat. IV dressing reinforced afterward.

## 2013-12-18 NOTE — ED Notes (Signed)
Pt sent from [facility] for further eval of facial abscess that started Monday and R foot swelling that started 1 1/2-2 months ago.  Pt rates pain 10/10.  Pt alert and oriented.

## 2013-12-18 NOTE — H&P (Addendum)
[facility] History and Physical  Orville Widmann KGM:[id number] DOB: 02-01-1971 DOA: 12/18/2013  Referring physician: Dr. Manuella Ghazi PCP: No PCP Per Patient   Chief Complaint: Neuropathic pain  HPI: [name] is a 43 y.o. female  With no significant past medical history that comes in for lower extremity pain this started 2 months prior to admission progressively getting worst as its ascending. She relates some lower extremity weakness. No recent viral infections, no fever chills nausea vomiting or diarrhea. No new medications. No recent travel history. She relates she's had unprotected sex with only one partner. No recent travel history. No IV drug abuse Or drug use at all. She also started developing some numbness in her upper extremities phalanges. And some numbness in the mouth. She said that 3 days prior to admission she also developed like redness in her face and the small pimple directed causing facial swelling redness and no tenderness.  In the ED: She is hyponatremic hypochloremic with a white count of 24 and left shift, Chest x-ray is most we are consulted for further evaluation.   Review of Systems:  Constitutional:  No weight loss, night sweats, Fevers, chills, fatigue.  HEENT:  No headaches, Difficulty swallowing,Tooth/dental problems,Sore throat,  No sneezing, itching, ear ache, nasal congestion, post nasal drip,  Cardio-vascular:  No chest pain, Orthopnea, PND, swelling in lower extremities, anasarca, dizziness, palpitations  GI:  No heartburn, indigestion, abdominal pain, nausea, vomiting, diarrhea, change in bowel habits, loss of appetite  Resp:  No shortness of breath with exertion or at rest. No excess mucus, no productive cough, No non-productive cough, No coughing up of blood.No change in color of mucus.No wheezing.No chest wall deformity  GU:  no dysuria, change in color of urine, no urgency or frequency. No flank pain.  Musculoskeletal:  No joint pain or  swelling. No decreased range of motion. No back pain.  Psych:  No change in mood or affect. No depression or anxiety. No memory loss.   History reviewed. No pertinent past medical history. Past Surgical History  Procedure Laterality Date  . Tubal ligation     Social History:  reports that she has been smoking.  She does not have any smokeless tobacco history on file. She reports that she drinks alcohol. She reports that she does not use illicit drugs.  No Known Allergies  Family History  Problem Relation Age of Onset  . Diabetes Father   . Diabetes Maternal Aunt   . Heart disease Maternal Aunt   . Diabetes Maternal Uncle   . Heart disease Maternal Uncle   . Drug abuse Maternal Grandmother   . Cancer Maternal Grandfather   . Hypertension Mother   . Raynaud syndrome Mother      Prior to Admission medications   Medication Sig Start Date End Date Taking? Authorizing Provider  Multiple Vitamins-Minerals (MULTIVITAMIN WITH MINERALS) tablet Take 1 tablet by mouth daily.   Yes Historical Provider, MD  Gabapentin, PHN, 300 MG TABS Take 300 mg by mouth once. Day 1: 300 mg (1 tab), Day 2: 300 mg twice daily, Day 3: 300 mg 3 times daily; dose may be titrated as needed for pain relief (range: 1,800 to 3,600 mg/day) 11/19/13   Noland Fordyce, PA-C  HYDROcodone-acetaminophen (NORCO/VICODIN) 5-325 MG per tablet Take 2 tablets by mouth every 4 (four) hours as needed for moderate pain. 11/29/13   Fransico Meadow, PA-C  traMADol (ULTRAM) 50 MG tablet Take 1 tablet (50 mg total) by mouth every 6 (six) hours  as needed. 11/19/13   Noland Fordyce, PA-C   Physical Exam: Filed Vitals:   12/18/13 1715  BP: 146/80  Pulse: 92  Temp:   Resp:     BP 146/80  Pulse 92  Temp(Src) 97.9 F (36.6 C) (Oral)  Resp 18  SpO2 100%  LMP 11/03/2013  General:  Appears calm and comfortable. Eyes: PERRL, normal lids, irises & conjunctiva. ENT: She has erythema over per labial fold with a pustule not tender to touch  but swelling of the right side of her face. Neck: no LAD, masses or thyromegaly Cardiovascular: RRR, no m/r/g. Lower extremity edema. Respiratory: CTA bilaterally, no w/r/r. Normal respiratory effort. Abdomen: soft, ntnd Skin: Livedo reticularis on her lower extremities. Psychiatric: grossly normal mood and affect, speech fluent and appropriate Neurologic: 3-12 are grossly intact sensation is intact in her upper extremity she does have hyperesthesia of her lower extremities I could not elicit deep tendon reflexes.Muscle strength is 5 over 5 in upper extremities and in her lower extremities is 4/5- due to some restriction secondary to pain.          Labs on Admission:  Basic Metabolic Panel:  Recent Labs Lab 12/18/13 1540  NA 129*  K 3.7  CL 86*  CO2 24  GLUCOSE 104*  BUN 9  CREATININE 0.67  CALCIUM 8.4   Liver Function Tests:  Recent Labs Lab 12/18/13 1540  AST 49*  ALT 8  ALKPHOS 183*  BILITOT 0.6  PROT 6.1  ALBUMIN 2.6*   No results found for this basename: LIPASE, AMYLASE,  in the last 168 hours No results found for this basename: AMMONIA,  in the last 168 hours CBC:  Recent Labs Lab 12/18/13 1540  WBC 24.2*  NEUTROABS 19.2*  HGB 10.2*  HCT 28.7*  MCV 98.6  PLT 397   Cardiac Enzymes: No results found for this basename: CKTOTAL, CKMB, CKMBINDEX, TROPONINI,  in the last 168 hours  BNP (last 3 results) No results found for this basename: PROBNP,  in the last 8760 hours CBG: No results found for this basename: GLUCAP,  in the last 168 hours  Radiological Exams on Admission: Ct Maxillofacial W/cm  12/18/2013   CLINICAL DATA:  Right-sided facial pain and swelling.  EXAM: CT MAXILLOFACIAL WITH CONTRAST  TECHNIQUE: Multidetector CT imaging of the maxillofacial structures was performed with intravenous contrast. Multiplanar CT image reconstructions were also generated. A small metallic BB was placed on the right temple in order to reliably differentiate right  from left.  CONTRAST:  78m OMNIPAQUE IOHEXOL 300 MG/ML  SOLN  COMPARISON:  None.  FINDINGS: There is a focal area of subcutaneous soft tissue swelling/ edema involving the right facial area overlying the right mandible and maxilla. No discrete drainable soft tissue abscess.  There is extensive dental disease with dental caries and right-sided periapical lucency involving the mandibular molars. No abnormality in the floor of the mouth. The tongue base is normal.  The parotid and submandibular glands are normal. The epiglottis is normal. The paraglottic fat planes are maintained. The piriform sinus and vallecular air spaces are normal.  Small scattered bilateral neck nodes but no mass or adenopathy.  The visualized portion of the brain is unremarkable. The paranasal sinuses and mastoid air cells are clear. Globes are intact.  IMPRESSION: Right facial cellulitis without discrete abscess. This is due to periapical abscess involving the right mandibular molar.   Electronically Signed   By: MKalman JewelsM.D.   On: 12/18/2013 18:03  EKG: Independently reviewed.  Assessment/Plan  Facial cellulitis; - Admit to med-surg. - Get blood cultures x2 start IV vancomycin.  - CT scan of her face pending at this point.  Lower extremity pain, bilateral(neuropatic pain): - Check an MRI of the cervical C-spine with contrast however a consult to neurology. - Get a TSH, HIV, RPR, ANA, ESR and B12. - Check UDS. She denies any IV drugs abuse. No track marks. - Also in the differential to include a due to her age Leukemia and lymphoma she does have a leukocytosis but this probably was related to her facial cellulitis.  Hyponatremia: - Check urinary sodium urinary creatinine her specific gravity is elevated started on IV fluids for 24 hours recheck a basic metabolic panel in the morning.   Code Status: full Family Communication: mother Disposition Plan: inpatient  Time spent: 8 minutes  [facility] Pager [phone]

## 2013-12-18 NOTE — Progress Notes (Signed)
Patient ID: [name], female   DOB: [birth year], 43 y.o.   MRN: [id number]   CC: numbness and tingling   HPI: Pt is 43 yo female who comes to clinic with main concern of 2 months duration of progressively worsening LE swelling, associated with constant and throbbing pain, numbness and tingling, pain is rated 10/10 in severity wit no specific alleviating factors, worse with standing up and weight bearing. Pt explains that currently she is wheelchair bound as she is unable to stand up. Further history reveals that pt's neuropathy started in the feet and has been slowly and progressively ascending to bilateral thigh area. She also reports right facial swelling and has small open blister. It is painful and red, she has difficulty eating and chewing. She reports subjective fevers, chills, malaise, poor oral intake.    No Known Allergies No known specific past medical history   Current Outpatient Prescriptions on File Prior to Visit  Medication Sig Dispense Refill  . Multiple Vitamins-Minerals (MULTIVITAMIN WITH MINERALS) tablet Take 1 tablet by mouth daily.      . naproxen sodium (ANAPROX) 220 MG tablet Take 220 mg by mouth 2 (two) times daily as needed (for pain).      . Gabapentin, PHN, 300 MG TABS Take 300 mg by mouth once. Day 1: 300 mg (1 tab), Day 2: 300 mg twice daily, Day 3: 300 mg 3 times daily; dose may be titrated as needed for pain relief (range: 1,800 to 3,600 mg/day)  20 tablet  0  . HYDROcodone-acetaminophen (NORCO/VICODIN) 5-325 MG per tablet Take 2 tablets by mouth every 4 (four) hours as needed for moderate pain.  20 tablet  0  . traMADol (ULTRAM) 50 MG tablet Take 1 tablet (50 mg total) by mouth every 6 (six) hours as needed.  15 tablet  0   No current facility-administered medications on file prior to visit.   Family History  Problem Relation Age of Onset  . Diabetes Father   . Diabetes Maternal Aunt   . Heart disease Maternal Aunt   . Diabetes Maternal Uncle   . Heart  disease Maternal Uncle   . Drug abuse Maternal Grandmother   . Cancer Maternal Grandfather    History   Social History  . Marital Status: Single    Spouse Name: N/A    Number of Children: N/A  . Years of Education: N/A   Occupational History  . Not on file.   Social History Main Topics  . Smoking status: Current Every Day Smoker  . Smokeless tobacco: Not on file  . Alcohol Use: Yes  . Drug Use: No  . Sexual Activity: No   Other Topics Concern  . Not on file   Social History Narrative  . No narrative on file    Review of Systems  Constitutional: Per HPI.  HENT: Negative for ear pain, nosebleeds, rhinorrhea, neck pain, neck stiffness and ear discharge.   Eyes: Negative for pain, discharge, redness, itching and visual disturbance.  Respiratory: Negative for cough, choking, chest tightness, shortness of breath, wheezing and stridor.   Cardiovascular: Negative for chest pain, palpitations.  Gastrointestinal: Negative for abdominal distention.  Genitourinary: Negative for dysuria, urgency, frequency, hematuria, flank pain, decreased urine volume, difficulty urinating and dyspareunia.  Musculoskeletal: Negative for back pain, joint swelling. Neurological: Negative for dizziness, tremors, seizures, syncope, facial asymmetry, and headaches.  Hematological: Negative for adenopathy. Does not bruise/bleed easily.  Psychiatric/Behavioral: Negative for hallucinations, behavioral problems, confusion, dysphoric mood, decreased concentration and agitation.  Objective:   Filed Vitals:   12/18/13 1122  BP: 160/103  Pulse: 88  Temp: 97.5 F (36.4 C)  Resp: 16    Physical Exam  Constitutional: Appears well-developed and well-nourished. In mild distress due to pain.  HENT: Normocephalic. External right and left ear normal. Oropharynx is clear and moist. right facial area swelling 1 cm in size, very TTP, erythematous and with ? Underlying abscess formation  Eyes: Conjunctivae and  EOM are normal. PERRLA, no scleral icterus.  Neck: Normal ROM. Neck supple. No JVD. No tracheal deviation. No thyromegaly.  CVS: RRR, S1/S2 +, no murmurs, no gallops, no carotid bruit.  Pulmonary: Effort and breath sounds normal, no stridor, rhonchi, wheezes, rales.  Abdominal: Soft. BS +,  no distension, tenderness, rebound or guarding.  Musculoskeletal: Normal range of motion. Trace bilateral LE edema with significant hyperesthesia and TTP. Extending to bilateral upper thigh area.  Lymphadenopathy: No lymphadenopathy noted, cervical, inguinal. Neuro: Alert. Significant hyperesthesia on exam, unable to touch the skin due to sensitivity and starting form bilateral feet and up to inner thigh area bilaterally   Skin: Skin is warm and dry. No rash noted. Not diaphoretic. No erythema. No pallor.  Psychiatric: Normal mood and affect. Behavior, judgment, thought content normal.   Lab Results  Component Value Date   WBC 17.6* 11/19/2013   HGB 14.6 11/29/2013   HCT 43.0 11/29/2013   MCV 102.7* 11/19/2013   PLT 257 11/19/2013   Lab Results  Component Value Date   CREATININE 0.60 11/29/2013   BUN 4* 11/29/2013   NA 132* 11/29/2013   K 3.7 11/29/2013   CL 89* 11/29/2013   CO2 27 11/19/2013    No results found for this basename: HGBA1C   Lipid Panel  No results found for this basename: chol, trig, hdl, cholhdl, vldl, ldlcalc       Assessment and plan:   Facial abscess - not sure that it can be drained but pt would benefit from IV ABX and possibly needs an admission overnight - she also needs xary or CT scan of the area to ensure no underlying abscess - need repeat CBC as well due to recent leukocytosis and to ensure WBC is trending down - Wound start with IV Vancomycin  - due to complexity and necessity of medical care, will refer pt to the ED for further evaluation and management  Lower extremity swelling and numbness, tingling - significant tenderness on exam, unable to stand up without  assistance, witness by myself  - per pts reports appears to be an ascending neuropathy, started in feet and now up to bilateral thighs  - etiology unclear - will need neurology consultation to evaluate etiology of this acute ascending neuropathy

## 2013-12-18 NOTE — Progress Notes (Signed)
ANTIBIOTIC CONSULT NOTE - INITIAL  Pharmacy Consult for Vancomycin Indication: R cheek abscess  No Known Allergies  Patient Measurements:    Ht: 65 in   Wt 123 lb (stated)  Vital Signs: Temp: 97.9 F (36.6 C) (04/08 1659) Temp src: Oral (04/08 1659) BP: 146/80 mmHg (04/08 1715) Pulse Rate: 92 (04/08 1715) Intake/Output from previous day:   Intake/Output from this shift:    Labs:  Recent Labs  12/18/13 1540  WBC 24.2*  HGB 10.2*  PLT 397  CREATININE 0.67   CrCl is unknown because there is no height on file for the current visit. No results found for this basename: VANCOTROUGH, VANCOPEAK, VANCORANDOM, GENTTROUGH, GENTPEAK, GENTRANDOM, TOBRATROUGH, TOBRAPEAK, TOBRARND, AMIKACINPEAK, AMIKACINTROU, AMIKACIN,  in the last 72 hours   Microbiology: No results found for this or any previous visit (from the past 720 hour(s)).  Medical History: History reviewed. No pertinent past medical history.  Medications:  See electronic med rec  Assessment: 43 y.o. presents with R cheek abscess and b/l foot pain. To begin Vancomycin for abscess. Estimated CrCl 80 ml/min. Clindamycin 600mg  given in ED ~1600  Goal of Therapy:  Vancomycin trough level 10-15 mcg/ml  Plan:  1. Vancomycin 1gm now then 750mg  IV q12h. 2. Will f/u renal function, pt's clinical condition, micro data, trough prn 3. F/u wt - pt thinks she may have lost wt since she weighed herself ~2 wks ago  Sherlon Handing, PharmD, BCPS Clinical pharmacist, pager [phone] 12/18/2013,5:35 PM

## 2013-12-18 NOTE — Hospital Discharge Follow-Up (Signed)
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 Patient will be connected with  case management services through [facility]. My contact information was provided for any future questions or concerns.

## 2013-12-18 NOTE — Progress Notes (Signed)
Pt admitted to the unit. Pt is alert and oriented. Pt oriented to room, staff, and call bell. Bed in lowest position. Full assessment to Epic. Call bell with in reach. Told to call for assists. Will continue to monitor.

## 2013-12-19 ENCOUNTER — Inpatient Hospital Stay (HOSPITAL_COMMUNITY): Payer: Medicaid Other

## 2013-12-19 DIAGNOSIS — E871 Hypo-osmolality and hyponatremia: Secondary | ICD-10-CM

## 2013-12-19 LAB — COMPREHENSIVE METABOLIC PANEL
ALT: 5 U/L (ref 0–35)
AST: 44 U/L — ABNORMAL HIGH (ref 0–37)
Albumin: 2.2 g/dL — ABNORMAL LOW (ref 3.5–5.2)
Alkaline Phosphatase: 150 U/L — ABNORMAL HIGH (ref 39–117)
BUN: 8 mg/dL (ref 6–23)
CO2: 21 mEq/L (ref 19–32)
Calcium: 7.8 mg/dL — ABNORMAL LOW (ref 8.4–10.5)
Chloride: 92 mEq/L — ABNORMAL LOW (ref 96–112)
Creatinine, Ser: 0.59 mg/dL (ref 0.50–1.10)
GFR calc Af Amer: >90 mL/min (ref >90)
GFR calc non Af Amer: >90 mL/min (ref >90)
Glucose, Bld: 90 mg/dL (ref 70–99)
Potassium: 3.9 mEq/L (ref 3.7–5.3)
Sodium: 133 mEq/L — ABNORMAL LOW (ref 137–147)
Total Bilirubin: 0.4 mg/dL (ref 0.3–1.2)
Total Protein: 5.4 g/dL — ABNORMAL LOW (ref 6.0–8.3)

## 2013-12-19 LAB — CBC
HCT: 24.1 % — ABNORMAL LOW (ref 36.0–46.0)
Hemoglobin: 8.3 g/dL — ABNORMAL LOW (ref 12.0–15.0)
MCH: 33.9 pg (ref 26.0–34.0)
MCHC: 34.4 g/dL (ref 30.0–36.0)
MCV: 98.4 fL (ref 78.0–100.0)
Platelets: 336 10*3/uL (ref 150–400)
RBC: 2.45 MIL/uL — ABNORMAL LOW (ref 3.87–5.11)
RDW: 15.2 % (ref 11.5–15.5)
WBC: 19.3 10*3/uL — ABNORMAL HIGH (ref 4.0–10.5)

## 2013-12-19 LAB — HIV ANTIBODY (ROUTINE TESTING W REFLEX): HIV 1&2 Ab, 4th Generation: NONREACTIVE

## 2013-12-19 LAB — RAPID URINE DRUG SCREEN, HOSP PERFORMED
Amphetamines: NOT DETECTED
Barbiturates: NOT DETECTED
Benzodiazepines: NOT DETECTED
Cocaine: NOT DETECTED
Opiates: POSITIVE — AB
Tetrahydrocannabinol: POSITIVE — AB

## 2013-12-19 LAB — SODIUM, URINE, RANDOM: Sodium, Ur: <20 mEq/L

## 2013-12-19 LAB — RHEUMATOID FACTOR: Rhuematoid fact SerPl-aCnc: <10 IU/mL (ref <=14)

## 2013-12-19 LAB — VITAMIN B12: Vitamin B-12: 381 pg/mL (ref 211–911)

## 2013-12-19 LAB — CREATININE, SERUM
Creatinine, Ser: 0.59 mg/dL (ref 0.50–1.10)
GFR calc Af Amer: >90 mL/min (ref >90)
GFR calc non Af Amer: >90 mL/min (ref >90)

## 2013-12-19 LAB — RPR

## 2013-12-19 LAB — CREATININE, URINE, RANDOM: Creatinine, Urine: 69.7 mg/dL

## 2013-12-19 LAB — PROTIME-INR
INR: 1.06 (ref 0.00–1.49)
Prothrombin Time: 13.6 seconds (ref 11.6–15.2)

## 2013-12-19 MED ORDER — SODIUM CHLORIDE 0.9 % IV SOLN
3.0000 g | Freq: Four times a day (QID) | INTRAVENOUS | Status: DC
  Administered 2013-12-19 – 2013-12-26 (×28): 3 g via INTRAVENOUS
  Filled 2013-12-19 (×33): qty 3

## 2013-12-19 MED ORDER — VITAMIN B-1 100 MG PO TABS
100.0000 mg | ORAL_TABLET | Freq: Every day | ORAL | Status: DC
Start: 2013-12-19 — End: 2013-12-27
  Administered 2013-12-20 – 2013-12-27 (×7): 100 mg via ORAL
  Filled 2013-12-19 (×9): qty 1

## 2013-12-19 MED ORDER — LORAZEPAM 2 MG/ML IJ SOLN: 1.0000 mg | Freq: Four times a day (QID) | INTRAMUSCULAR | Status: AC | PRN

## 2013-12-19 MED ORDER — LORAZEPAM 1 MG PO TABS
1.0000 mg | ORAL_TABLET | Freq: Four times a day (QID) | ORAL | Status: AC | PRN
  Administered 2013-12-22: 1 mg via ORAL
  Filled 2013-12-19: qty 1

## 2013-12-19 MED ORDER — THIAMINE HCL 100 MG/ML IJ SOLN
100.0000 mg | Freq: Every day | INTRAMUSCULAR | Status: DC
  Administered 2013-12-19: 100 mg via INTRAVENOUS
  Filled 2013-12-19 (×5): qty 1

## 2013-12-19 MED ORDER — FOLIC ACID 1 MG PO TABS
1.0000 mg | ORAL_TABLET | Freq: Every day | ORAL | Status: DC
  Administered 2013-12-19 – 2013-12-27 (×8): 1 mg via ORAL
  Filled 2013-12-19 (×9): qty 1

## 2013-12-19 MED ORDER — GABAPENTIN 300 MG PO CAPS
300.0000 mg | ORAL_CAPSULE | Freq: Two times a day (BID) | ORAL | Status: DC
  Administered 2013-12-19 (×2): 300 mg via ORAL
  Filled 2013-12-19 (×4): qty 1

## 2013-12-19 MED ORDER — ADULT MULTIVITAMIN W/MINERALS CH
1.0000 | ORAL_TABLET | Freq: Every day | ORAL | Status: DC
  Administered 2013-12-19 – 2013-12-27 (×8): 1 via ORAL
  Filled 2013-12-19 (×9): qty 1

## 2013-12-19 NOTE — Progress Notes (Signed)
ANTIBIOTIC CONSULT NOTE - INITIAL  Pharmacy Consult for Vancomycin Indication: R cheek abscess  No Known Allergies  Patient Measurements: Height: 5\' 5"  (165.1 cm) Weight: 127 lb 4.8 oz (57.743 kg) IBW/kg (Calculated) : 57  Ht: 65 in   Wt 123 lb (stated)  Vital Signs: Temp: 98 F (36.7 C) (04/09 0611) Temp src: Oral (04/09 0611) BP: 153/87 mmHg (04/09 0611) Pulse Rate: 94 (04/09 0611) Intake/Output from previous day: 04/08 0701 - 04/09 0700 In: 120 [P.O.:120] Out: 300 [Urine:300] Intake/Output from this shift:    Labs:  Recent Labs  12/18/13 1540 12/18/13 2331 12/19/13 0530 12/19/13 0625  WBC 24.2* 22.2*  --   --   HGB 10.2* 8.8*  --   --   PLT 397 366  --   --   LABCREA  --   --   --  69.70  CREATININE 0.67 0.59 0.59  --    Estimated Creatinine Clearance: 82.4 ml/min (by C-G formula based on Cr of 0.59). No results found for this basename: VANCOTROUGH, VANCOPEAK, VANCORANDOM, GENTTROUGH, GENTPEAK, GENTRANDOM, TOBRATROUGH, TOBRAPEAK, TOBRARND, AMIKACINPEAK, AMIKACINTROU, AMIKACIN,  in the last 72 hours   Microbiology: No results found for this or any previous visit (from the past 720 hour(s)).  Medical History: History reviewed. No pertinent past medical history.  Medications:  See electronic med rec  Assessment: 43 y.o. presents with R cheek abscess and b/l foot pain. To begin Vancomycin for abscess. Estimated CrCl 80 ml/min. Clindamycin 600mg  given in ED ~1600  Goal of Therapy:  Vancomycin trough level 10-15 mcg/ml  Plan:  - Continue vancomycin 750mg  IV q12h. - Will f/u renal function, pt's clinical condition, micro data, trough prn - F/u wt - pt thinks she may have lost wt since she weighed herself ~2 wks ago - Start Unasyn 3 g IV q6h   Hughes Better, PharmD, BCPS Clinical Pharmacist Pager: [phone] 12/19/2013 8:27 AM

## 2013-12-19 NOTE — Clinical Social Work Psychosocial (Signed)
Clinical Social Work Department BRIEF PSYCHOSOCIAL ASSESSMENT 12/19/2013  Patient:  [name], [name]     Account Number:  [id number]     Admit date:  12/18/2013  Clinical Social Worker:  Lovey Newcomer  Date/Time:  12/19/2013 02:00 PM  Referred by:  Physician  Date Referred:  12/19/2013 Referred for  SNF Placement   Other Referral:   Interview type:  Patient Other interview type:   Patient alert and oriented at time of assessment.    PSYCHOSOCIAL DATA Living Status:  FAMILY Admitted from facility:   Level of care:   Primary support name:  Santiago Glad (mother) Primary support relationship to patient:  PARENT Degree of support available:   Support is adequate. Patient has a "friend" that she lives with and her 43 year old daughter.    CURRENT CONCERNS Current Concerns  Post-Acute Placement   Other Concerns:    SOCIAL WORK ASSESSMENT / PLAN CSW met with patient and patient's mother at bedside to complete assessment. At first patient was apprehensive about going to SNF at discharge as she has responsibility for her 34 year old daughter. Patient and her mother discussed the patient's daughter possibly staying with another family member if she has to go to SNF. CSW explained the SNF search process and explained that finding an LOG bed cannot be guaranteed as many facilities do not accept LOGs. Patient understands this. Patient states that she lives with a "friend" and her 43 year old daughter. CSW inquired about patient's ability to manage at home. Patient states, "Well I can't walk right now so I don't think I would really be able to get around at home, unless that changes before I discharge." Patient states that she would prefer a SNF in [place] if possible.   Assessment/plan status:  Psychosocial Support/Ongoing Assessment of Needs Other assessment/ plan:   Complete FL2, Fax, PASRR   Information/referral to community resources:   CSW contact information and SNF list  given to patient.    PATIENT'S/FAMILY'S RESPONSE TO PLAN OF CARE: Patient is agreeable to SNF placement if necessary, but CSW senses that patient would like to return home if at [name] possbile. Patient states, "This is [name] new to me, but I'm willing to do what it takes to get better." Patient and mother were pleasant, appropriate, and appreciative of CSW visit. CSW will assist with DC if appropriate.       Liz Beach MSW, [place], [place], [phone]

## 2013-12-19 NOTE — Progress Notes (Signed)
PATIENT DETAILS Name: [name] Age: 43 y.o. Sex: female Date of Birth: Oct 17, 1970 Admit Date: 12/18/2013 Admitting Physician Charlynne Cousins, MD PCP:No PCP Per Patient  Subjective: Minimal decrease in left facial swelling.  Assessment/Plan: Active Problems:   Facial cellulitis - Admitted, and started on vancomycin on 4/8, since foci is likely dental caries, would add Unasyn for anaerobic coverage. - Still with significant leukocytosis, however slightly better than on admission. However is afebrile. CT of the facial region.admit on admission was negative for abscess   Systemic inflammatory response syndrome - Secondary to above, treated with antibiotics.  Bilateral lower extremity neuropathy - Neurology consulted, workup in progress. Await MRI of the cervical/thoracic spine, ANA. Etiology unknown, however suspicion for alcoholic neuropathy. - RPR nonreactive, HIV nonreactive, ESR only at 5, rheumatoid factor negative. Vitamin B12 at 381. - Will start Neurontin - PT evaluation  Hyponatremia - Likely secondary to dehydration, better with IV fluids.  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    MEDICATIONS: Scheduled Meds: . ampicillin-sulbactam (UNASYN) IV  3 g Intravenous Q6H  . heparin  5,000 Units Subcutaneous 3 times per day  . vancomycin  750 mg Intravenous Q12H   Continuous Infusions: . sodium chloride 100 mL/hr at 12/19/13 0331   PRN Meds:.acetaminophen, acetaminophen, HYDROcodone-acetaminophen, morphine injection, ondansetron (ZOFRAN) IV,  ondansetron, polyethylene glycol  Antibiotics: Anti-infectives   Start     Dose/Rate Route Frequency Ordered Stop   12/19/13 0930  Ampicillin-Sulbactam (UNASYN) 3 g in sodium chloride 0.9 % 100 mL IVPB     3 g 100 mL/hr over 60 Minutes Intravenous Every 6 hours 12/19/13 0826     12/19/13 0600  vancomycin (VANCOCIN) IVPB 750 mg/150 ml premix     750 mg 150 mL/hr over 60 Minutes Intravenous Every 12 hours 12/18/13 1808     12/18/13 1815  vancomycin (VANCOCIN) IVPB 1000 mg/200 mL premix     1,000 mg 200 mL/hr over 60 Minutes Intravenous NOW 12/18/13 1807 12/18/13 2006   12/18/13 1545  clindamycin (CLEOCIN) IVPB 600 mg     600 mg 100 mL/hr over 30 Minutes Intravenous  Once 12/18/13 1539 12/18/13 1632       PHYSICAL EXAM: Vital signs in last 24 hours: Filed Vitals:   12/18/13 1853 12/18/13 1930 12/18/13 2016 12/19/13 0611  BP: 115/63 118/68 130/80 153/87  Pulse: 92 86 95 94  Temp:   98.3 F (36.8 C) 98 F (36.7 C)  TempSrc:   Oral Oral  Resp: $Remo'16  16 20  'cGBet$ Height:   '5\' 5"'$  (1.651 m)   Weight:   57.743 kg (127 lb 4.8 oz)   SpO2: 100% 97% 99% 99%    Weight change:  Filed Weights   12/18/13 2016  Weight: 57.743 kg (127 lb 4.8 oz)   Body mass index is 21.18 kg/(m^2).   Gen Exam: Awake and alert with clear speech.  Neck: Supple, No JVD.   Chest: B/L Clear.   CVS: S1 S2 Regular, no murmurs.  Abdomen: soft, BS +, non tender, non distended.  Extremities: no edema, lower extremities warm  to touch. Neurologic: Non Focal.   Skin: No Rash.  Wounds: N/A.    Intake/Output from previous day:  Intake/Output Summary (Last 24 hours) at 12/19/13 1159 Last data filed at 12/19/13 1011  Gross per 24 hour  Intake    320 ml  Output    300 ml  Net     20 ml     LAB RESULTS: CBC  Recent Labs Lab 12/18/13 1540 12/18/13 2331 12/19/13 0530  WBC 24.2* 22.2* 19.3*  HGB 10.2* 8.8* 8.3*  HCT 28.7* 25.8* 24.1*  PLT 397 366 336  MCV 98.6 98.9 98.4  MCH 35.1* 33.7 33.9  MCHC 35.5  34.1 34.4  RDW 15.4 15.3 15.2  LYMPHSABS 2.8  --   --   MONOABS 2.1*  --   --   EOSABS 0.1  --   --   BASOSABS 0.1  --   --     Chemistries   Recent Labs Lab 12/18/13 1540 12/18/13 2331 12/19/13 0530  NA 129*  --  133*  K 3.7  --  3.9  CL 86*  --  92*  CO2 24  --  21  GLUCOSE 104*  --  90  BUN 9  --  8  CREATININE 0.67 0.59 0.59  CALCIUM 8.4  --  7.8*    CBG: No results found for this basename: GLUCAP,  in the last 168 hours  GFR Estimated Creatinine Clearance: 82.4 ml/min (by C-G formula based on Cr of 0.59).  Coagulation profile  Recent Labs Lab 12/19/13 0530  INR 1.06    Cardiac Enzymes No results found for this basename: CK, CKMB, TROPONINI, MYOGLOBIN,  in the last 168 hours  No components found with this basename: POCBNP,  No results found for this basename: DDIMER,  in the last 72 hours No results found for this basename: HGBA1C,  in the last 72 hours No results found for this basename: CHOL, HDL, LDLCALC, TRIG, CHOLHDL, LDLDIRECT,  in the last 72 hours No results found for this basename: TSH, T4TOTAL, FREET3, T3FREE, THYROIDAB,  in the last 72 hours  Recent Labs  12/18/13 2331  VITAMINB12 381   No results found for this basename: LIPASE, AMYLASE,  in the last 72 hours  Urine Studies No results found for this basename: UACOL, UAPR, USPG, UPH, UTP, UGL, UKET, UBIL, UHGB, UNIT, UROB, ULEU, UEPI, UWBC, URBC, UBAC, CAST, CRYS, UCOM, BILUA,  in the last 72 hours  MICROBIOLOGY: No results found for this or any previous visit (from the past 240 hour(s)).  RADIOLOGY STUDIES/RESULTS: Dg Orthopantogram  12/19/2013   CLINICAL DATA:  Facial cellulitis  EXAM: ORTHOPANTOGRAM/PANORAMIC  COMPARISON:  CT 12/18/2013  FINDINGS: Multiple caries especially right lower molars and left upper third molar.  Periapical lucency around two right lower molars consistent with infection. This is noted on the CT.  Negative for fracture or mass.  IMPRESSION: Dental caries.   Periapical lucency around two right lower molars consistent with infection.   Electronically Signed   By: Franchot Gallo M.D.   On: 12/19/2013 09:09   Dg Chest 2 View  11/19/2013   CLINICAL DATA Chest pain.  EXAM CHEST  2 VIEW  COMPARISON None.  FINDINGS No cardiomegaly. Unremarkable mediastinal contours. No infiltrate, edema, effusion, or pneumothorax. There is an apparent 22 mm long lucency in the posterior right fifth rib. This could be related to a true expansile bone lesion or summation of lung markings.  IMPRESSION 1. No active cardiopulmonary disease. 2. Questionable  expansile bone lesion in the posterior right fifth rib. Recommend dedicated rib series for confirmation (would like to exclude CT radiation due to young age).  SIGNATURE  Electronically Signed   By: Jorje Guild M.D.   On: 11/19/2013 13:27   Dg Ribs Unilateral Right  11/19/2013   CLINICAL DATA Lower rib pain, fall 3 weeks ago  EXAM RIGHT RIBS - 2 VIEW  COMPARISON 11/19/2013  FINDINGS Radiopaque marker over the right lower ribs. No displaced fracture or definite focal rib abnormality. Inferior right ribs appear intact. Right lung clear. No effusion or pneumothorax. No chest wall asymmetry or subcutaneous emphysema.  IMPRESSION No acute osseous finding  SIGNATURE  Electronically Signed   By: Daryll Brod M.D.   On: 11/19/2013 16:57   Ct Maxillofacial W/cm  12/18/2013   CLINICAL DATA:  Right-sided facial pain and swelling.  EXAM: CT MAXILLOFACIAL WITH CONTRAST  TECHNIQUE: Multidetector CT imaging of the maxillofacial structures was performed with intravenous contrast. Multiplanar CT image reconstructions were also generated. A small metallic BB was placed on the right temple in order to reliably differentiate right from left.  CONTRAST:  18mL OMNIPAQUE IOHEXOL 300 MG/ML  SOLN  COMPARISON:  None.  FINDINGS: There is a focal area of subcutaneous soft tissue swelling/ edema involving the right facial area overlying the right mandible and  maxilla. No discrete drainable soft tissue abscess.  There is extensive dental disease with dental caries and right-sided periapical lucency involving the mandibular molars. No abnormality in the floor of the mouth. The tongue base is normal.  The parotid and submandibular glands are normal. The epiglottis is normal. The paraglottic fat planes are maintained. The piriform sinus and vallecular air spaces are normal.  Small scattered bilateral neck nodes but no mass or adenopathy.  The visualized portion of the brain is unremarkable. The paranasal sinuses and mastoid air cells are clear. Globes are intact.  IMPRESSION: Right facial cellulitis without discrete abscess. This is due to periapical abscess involving the right mandibular molar.   Electronically Signed   By: Kalman Jewels M.D.   On: 12/18/2013 18:03    Shanker Kristeen Mans, MD  [facility] Pager:[phone]  If 7PM-7AM, please contact night-coverage [email or web address] Password [email or web address] 12/19/2013, 11:59 AM   LOS: 1 day

## 2013-12-19 NOTE — Consult Note (Addendum)
NEURO HOSPITALIST CONSULT NOTE    Reason for Consult: BLE weakness/pain/numbness.   HPI:                                                                                                                                          [name] is an 43 y.o. female with a past medical history significant for heavy smoking, daily ETOH consumption, who provides a history of gradual but worsening paresthesias/dysesthesias, and weakness of her bilateral lower extremities since approximately 2 months ago. She recalls having an initial sensation of tingling in her feet going to below her knees, followed by constant sharp-burning pain in her feet, numbness traveling up to her chest and mouth, as well as weakness in her legs. Some tingling and numbness also started in her arm few days ago.  Stated that the above described symptoms have progressed to the point that she can not stand up and walk. Anything that touches her feet and legs provokes significant pain. She thinks she is losing a lot of muscle bulk in her legs. Denies bladder or bowel impairment. No lower back or neck pain. In addition, denies recent fever, chills, but said that 3 days prior to admission she also developed like redness in her face and the small pimple directed causing facial swelling redness and no tenderness. 10-15 pounds weight loss in the past couple of months. Denies HA, vertigo, double vision, difficulty swallowing, slurred speech, language or visual disturbances. Work up so far significant for leukocytosis >24,000 with a left shift. Hyponatremia 129.  History reviewed. No pertinent past medical history.  Past Surgical History  Procedure Laterality Date  . Tubal ligation      Family History  Problem Relation Age of Onset  . Diabetes Father   . Diabetes Maternal Aunt   . Heart disease Maternal Aunt   . Diabetes Maternal Uncle   . Heart disease Maternal Uncle   . Drug abuse Maternal Grandmother   .  Cancer Maternal Grandfather   . Hypertension Mother   . Raynaud syndrome Mother     Family History: no neurological disease.   Social History:  reports that she has been smoking.  She does not have any smokeless tobacco history on file. She reports that she drinks alcohol. She reports that she does not use illicit drugs.  No Known Allergies  MEDICATIONS:  I have reviewed the patient's current medications.   ROS:                                                                                                                                       History obtained from the patient and chart review.  General ROS: significant for weight loss Psychological ROS: negative for - behavioral disorder, hallucinations, memory difficulties, mood swings or suicidal ideation Ophthalmic ROS: negative for - blurry vision, double vision, eye pain or loss of vision ENT ROS: negative for - epistaxis, nasal discharge, oral lesions, sore throat, tinnitus or vertigo Allergy and Immunology ROS: negative for - hives or itchy/watery eyes Hematological and Lymphatic ROS: negative for - bleeding problems, bruising or swollen lymph nodes Endocrine ROS: negative for - galactorrhea, hair pattern changes, polydipsia/polyuria or temperature intolerance Respiratory ROS: negative for - cough, hemoptysis, shortness of breath or wheezing Cardiovascular ROS: negative for - chest pain, dyspnea on exertion, edema or irregular heartbeat Gastrointestinal ROS: negative for - abdominal pain, diarrhea, hematemesis, nausea/vomiting or stool incontinence Genito-Urinary ROS: negative for - dysuria, hematuria, incontinence or urinary frequency/urgency Musculoskeletal ROS: negative for - joint swelling or muscular weakness Neurological ROS: as noted in HPI Dermatological ROS: significant for rash and skin lesion right  lower face  Physical exam: pleasant female in no distress. Blood pressure 130/80, pulse 95, temperature 98.3 F (36.8 C), temperature source Oral, resp. rate 16, height 5' 5" (1.651 m), weight 57.743 kg (127 lb 4.8 oz), last menstrual period 11/03/2013, SpO2 99.00%. Head: normocephalic. Neck: supple, no bruits, no JVD. Cardiac: no murmurs. Lungs: clear. Abdomen: soft, no tender, no mass. Extremities: mild edema right foot. Has bilateral pes cavus. CV: pulses palpable throughout  Neurologic Examination:                                                                                                      Mental Status: Alert, oriented, thought content appropriate.  Speech fluent without evidence of aphasia.  Able to follow 3 step commands without difficulty. Cranial Nerves: II: Discs flat bilaterally; Visual fields grossly normal, pupils equal, round, reactive to light and accommodation III,IV, VI: ptosis not present, extra-ocular motions intact bilaterally V,VII: smile symmetric, facial light touch sensation normal bilaterally VIII: hearing normal bilaterally IX,X: gag reflex present XI: bilateral shoulder shrug XII: midline tongue extension without atrophy or fasciculations  Motor: There is pain involved which precludes comprehensive and reliable muscle testing, but she seems to have some degree of weakness distally  greater than proximal bilateral LE. Tone: normal tone throughout; no atrophy noted Sensory: Joint position sense, vibratory, pinprick, and light touch seem to be impaired bilaterally. A probable sensory level at T6-T7?. Prominent allodynia feet>legs. Deep Tendon Reflexes:  Couldn't elicit knee and ankle jerks bilaterally. 1+ upper extremities. Plantars: Right: downgoing   Left: downgoing Cerebellar: normal finger-to-nose,  normal heel-to-shin test Gait:  Unable to test.    No results found for this basename: cbc, bmp, coags, chol, tri, ldl, hga1c    Results for  orders placed during the hospital encounter of 12/18/13 (from the past 48 hour(s))  CBC WITH DIFFERENTIAL     Status: Abnormal   Collection Time    12/18/13  3:40 PM      Result Value Ref Range   WBC 24.2 (*) 4.0 - 10.5 K/uL   RBC 2.91 (*) 3.87 - 5.11 MIL/uL   Hemoglobin 10.2 (*) 12.0 - 15.0 g/dL   HCT 28.7 (*) 36.0 - 46.0 %   MCV 98.6  78.0 - 100.0 fL   MCH 35.1 (*) 26.0 - 34.0 pg   MCHC 35.5  30.0 - 36.0 g/dL   RDW 15.4  11.5 - 15.5 %   Platelets 397  150 - 400 K/uL   Neutrophils Relative % 80 (*) 43 - 77 %   Neutro Abs 19.2 (*) 1.7 - 7.7 K/uL   Lymphocytes Relative 11 (*) 12 - 46 %   Lymphs Abs 2.8  0.7 - 4.0 K/uL   Monocytes Relative 9  3 - 12 %   Monocytes Absolute 2.1 (*) 0.1 - 1.0 K/uL   Eosinophils Relative 0  0 - 5 %   Eosinophils Absolute 0.1  0.0 - 0.7 K/uL   Basophils Relative 0  0 - 1 %   Basophils Absolute 0.1  0.0 - 0.1 K/uL  COMPREHENSIVE METABOLIC PANEL     Status: Abnormal   Collection Time    12/18/13  3:40 PM      Result Value Ref Range   Sodium 129 (*) 137 - 147 mEq/L   Potassium 3.7  3.7 - 5.3 mEq/L   Chloride 86 (*) 96 - 112 mEq/L   CO2 24  19 - 32 mEq/L   Glucose, Bld 104 (*) 70 - 99 mg/dL   BUN 9  6 - 23 mg/dL   Creatinine, Ser 0.67  0.50 - 1.10 mg/dL   Calcium 8.4  8.4 - 10.5 mg/dL   Total Protein 6.1  6.0 - 8.3 g/dL   Albumin 2.6 (*) 3.5 - 5.2 g/dL   AST 49 (*) 0 - 37 U/L   ALT 8  0 - 35 U/L   Alkaline Phosphatase 183 (*) 39 - 117 U/L   Total Bilirubin 0.6  0.3 - 1.2 mg/dL   GFR calc non Af Amer >90  >90 mL/min   GFR calc Af Amer >90  >90 mL/min   Comment: (NOTE)     The eGFR has been calculated using the CKD EPI equation.     This calculation has not been validated in all clinical situations.     eGFR's persistently <90 mL/min signify possible Chronic Kidney     Disease.  SEDIMENTATION RATE     Status: None   Collection Time    12/18/13  7:15 PM      Result Value Ref Range   Sed Rate 5  0 - 22 mm/hr  CBC     Status: Abnormal    Collection Time      12/18/13 11:31 PM      Result Value Ref Range   WBC 22.2 (*) 4.0 - 10.5 K/uL   RBC 2.61 (*) 3.87 - 5.11 MIL/uL   Hemoglobin 8.8 (*) 12.0 - 15.0 g/dL   HCT 25.8 (*) 36.0 - 46.0 %   MCV 98.9  78.0 - 100.0 fL   MCH 33.7  26.0 - 34.0 pg   MCHC 34.1  30.0 - 36.0 g/dL   RDW 15.3  11.5 - 15.5 %   Platelets 366  150 - 400 K/uL    Ct Maxillofacial W/cm  12/18/2013   CLINICAL DATA:  Right-sided facial pain and swelling.  EXAM: CT MAXILLOFACIAL WITH CONTRAST  TECHNIQUE: Multidetector CT imaging of the maxillofacial structures was performed with intravenous contrast. Multiplanar CT image reconstructions were also generated. A small metallic BB was placed on the right temple in order to reliably differentiate right from left.  CONTRAST:  79m OMNIPAQUE IOHEXOL 300 MG/ML  SOLN  COMPARISON:  None.  FINDINGS: There is a focal area of subcutaneous soft tissue swelling/ edema involving the right facial area overlying the right mandible and maxilla. No discrete drainable soft tissue abscess.  There is extensive dental disease with dental caries and right-sided periapical lucency involving the mandibular molars. No abnormality in the floor of the mouth. The tongue base is normal.  The parotid and submandibular glands are normal. The epiglottis is normal. The paraglottic fat planes are maintained. The piriform sinus and vallecular air spaces are normal.  Small scattered bilateral neck nodes but no mass or adenopathy.  The visualized portion of the brain is unremarkable. The paranasal sinuses and mastoid air cells are clear. Globes are intact.  IMPRESSION: Right facial cellulitis without discrete abscess. This is due to periapical abscess involving the right mandibular molar.   Electronically Signed   By: MKalman JewelsM.D.   On: 12/18/2013 18:03   Assessment/Plan: 43y/o with a history of heavy smoking and daily ETOH consumption, admitted with right face cellulitis and progressive bilateral LE  paresthesias/dysethesias/weakness over the past 2 months. Neuro-exam is compromised by severe pain and allodynia BLE. Differential includes a painful symmetrical distal sensory-motor symmetrical verus a subacute myelopathic process (seems to have a sensory level at T6-7 but preserved bladder-bowel function and lack of corticospinal signs on exam speak against it). She has right facial cellulitis with elevated white count that did not precede patient's neurological syndrome.' Will suggest starting the work up with MRI cervical-thoracic spine with and without contrast. If negative, will necessitate extensive testing for painful neuropathy including serologies, NCS/EMG, and quite likely LP. Neurology will follow up   ODorian Pod MD 12/19/2013, 12:15 AM [facility]

## 2013-12-19 NOTE — Progress Notes (Signed)
Utilization review completed. Rakin Lemelle, RN, BSN. 

## 2013-12-19 NOTE — Progress Notes (Signed)
Nutrition Brief Note  Patient identified on the Malnutrition Screening Tool (MST) Report. Pt reports that "several months ago" she weighed 135 lb. She states that she has had unintentional weight loss 2/2 decreased oral intake from mouth pain. She is currently eating 100% of meals and denies any need for softer foods. Denies any nutrition questions/concerns at this time. Appears well-nourished.  Wt Readings from Last 15 Encounters:  12/18/13 127 lb 4.8 oz (57.743 kg)  11/19/13 123 lb 1.6 oz (55.838 kg)    Body mass index is 21.18 kg/(m^2). Patient meets criteria for WNL based on current BMI.   Current diet order is Regular, patient is consuming approximately 100% of meals at this time. Labs and medications reviewed.   No nutrition interventions warranted at this time. If nutrition issues arise, please consult RD.   Inda Coke MS, RD, LDN Inpatient Registered Dietitian Pager: [phone] After-hours pager: [phone]

## 2013-12-19 NOTE — ED Provider Notes (Signed)
I have personally seen and examined the patient.  I have discussed the plan of care with the resident.  I have reviewed the documentation on PMH/FH/Soc. History.  I have reviewed the documentation of the resident and agree.   Sharyon Cable, MD 12/19/13 2026

## 2013-12-19 NOTE — Clinical Social Work Placement (Signed)
Clinical Social Work Department CLINICAL SOCIAL WORK PLACEMENT NOTE 12/19/2013  Patient:  ALERIA, MAHEU  Account Number:  [id number] Admit date:  12/18/2013  Clinical Social Worker:  Kemper Durie, [place]  Date/time:  12/19/2013 03:40 PM  Clinical Social Work is seeking post-discharge placement for this patient at the following level of care:   SKILLED NURSING   (*CSW will update this form in Epic as items are completed)   12/19/2013  Patient/family provided with [facility] Department of Clinical Social Work's list of facilities offering this level of care within the geographic area requested by the patient (or if unable, by the patient's family).  12/19/2013  Patient/family informed of their freedom to choose among providers that offer the needed level of care, that participate in Medicare, Medicaid or managed care program needed by the patient, have an available bed and are willing to accept the patient.  12/19/2013  Patient/family informed of [facility]' ownership interest in [facility], as well as of the fact that they are under no obligation to receive care at this facility.  PASARR submitted to EDS on 12/19/2013 PASARR number received from EDS on 12/19/2013  FL2 transmitted to all facilities in geographic area requested by pt/family on  12/19/2013 FL2 transmitted to all facilities within larger geographic area on 12/19/2013  Patient informed that his/her managed care company has contracts with or will negotiate with  certain facilities, including the following:     Patient/family informed of bed offers received:   Patient chooses bed at  Physician recommends and patient chooses bed at    Patient to be transferred to  on   Patient to be transferred to facility by   The following physician request were entered in Epic:   Additional Comments:    Liz Beach MSW, [place], [place], [phone]

## 2013-12-20 ENCOUNTER — Inpatient Hospital Stay (HOSPITAL_COMMUNITY): Payer: Medicaid Other

## 2013-12-20 LAB — BASIC METABOLIC PANEL
BUN: 5 mg/dL — ABNORMAL LOW (ref 6–23)
CO2: 26 mEq/L (ref 19–32)
Calcium: 7.9 mg/dL — ABNORMAL LOW (ref 8.4–10.5)
Chloride: 95 mEq/L — ABNORMAL LOW (ref 96–112)
Creatinine, Ser: 0.54 mg/dL (ref 0.50–1.10)
GFR calc Af Amer: >90 mL/min (ref >90)
GFR calc non Af Amer: >90 mL/min (ref >90)
Glucose, Bld: 93 mg/dL (ref 70–99)
Potassium: 3.2 mEq/L — ABNORMAL LOW (ref 3.7–5.3)
Sodium: 136 mEq/L — ABNORMAL LOW (ref 137–147)

## 2013-12-20 LAB — VITAMIN B12: Vitamin B-12: 394 pg/mL (ref 211–911)

## 2013-12-20 LAB — IRON AND TIBC
Iron: 79 ug/dL (ref 42–135)
Saturation Ratios: 51 % (ref 20–55)
TIBC: 155 ug/dL — ABNORMAL LOW (ref 250–470)
UIBC: 76 ug/dL — ABNORMAL LOW (ref 125–400)

## 2013-12-20 LAB — ANA: Anti Nuclear Antibody(ANA): NEGATIVE

## 2013-12-20 LAB — RETICULOCYTES
RBC.: 2.43 MIL/uL — ABNORMAL LOW (ref 3.87–5.11)
Retic Count, Absolute: 150.7 10*3/uL (ref 19.0–186.0)
Retic Ct Pct: 6.2 % — ABNORMAL HIGH (ref 0.4–3.1)

## 2013-12-20 LAB — CBC
HCT: 24.9 % — ABNORMAL LOW (ref 36.0–46.0)
Hemoglobin: 8.5 g/dL — ABNORMAL LOW (ref 12.0–15.0)
MCH: 34.4 pg — ABNORMAL HIGH (ref 26.0–34.0)
MCHC: 34.1 g/dL (ref 30.0–36.0)
MCV: 100.8 fL — ABNORMAL HIGH (ref 78.0–100.0)
Platelets: 309 10*3/uL (ref 150–400)
RBC: 2.47 MIL/uL — ABNORMAL LOW (ref 3.87–5.11)
RDW: 15.6 % — ABNORMAL HIGH (ref 11.5–15.5)
WBC: 15.1 10*3/uL — ABNORMAL HIGH (ref 4.0–10.5)

## 2013-12-20 LAB — FERRITIN: Ferritin: 656 ng/mL — ABNORMAL HIGH (ref 10–291)

## 2013-12-20 LAB — HEMOGLOBIN A1C
Hgb A1c MFr Bld: 4.8 % (ref <5.7)
Mean Plasma Glucose: 91 mg/dL (ref <117)

## 2013-12-20 LAB — TSH: TSH: 3.57 u[IU]/mL (ref 0.350–4.500)

## 2013-12-20 LAB — MAGNESIUM: Magnesium: 1.6 mg/dL (ref 1.5–2.5)

## 2013-12-20 LAB — FOLATE: Folate: >20 ng/mL

## 2013-12-20 LAB — RPR

## 2013-12-20 MED ORDER — POLYETHYLENE GLYCOL 3350 17 G PO PACK
17.0000 g | PACK | Freq: Every day | ORAL | Status: DC
  Administered 2013-12-20 – 2013-12-26 (×2): 17 g via ORAL
  Filled 2013-12-20 (×8): qty 1

## 2013-12-20 MED ORDER — LORAZEPAM 0.5 MG PO TABS
0.5000 mg | ORAL_TABLET | Freq: Once | ORAL | Status: AC
  Administered 2013-12-20: 0.5 mg via ORAL
  Filled 2013-12-20: qty 1

## 2013-12-20 MED ORDER — GADOBENATE DIMEGLUMINE 529 MG/ML IV SOLN
15.0000 mL | Freq: Once | INTRAVENOUS | Status: AC | PRN
  Administered 2013-12-20: 12 mL via INTRAVENOUS

## 2013-12-20 MED ORDER — POTASSIUM CHLORIDE CRYS ER 20 MEQ PO TBCR
40.0000 meq | EXTENDED_RELEASE_TABLET | Freq: Once | ORAL | Status: AC
  Administered 2013-12-20: 40 meq via ORAL
  Filled 2013-12-20: qty 2

## 2013-12-20 MED ORDER — GABAPENTIN 300 MG PO CAPS
300.0000 mg | ORAL_CAPSULE | Freq: Three times a day (TID) | ORAL | Status: DC
  Administered 2013-12-20 – 2013-12-21 (×5): 300 mg via ORAL
  Filled 2013-12-20 (×8): qty 1

## 2013-12-20 MED ORDER — DULOXETINE HCL 30 MG PO CPEP
30.0000 mg | ORAL_CAPSULE | Freq: Every day | ORAL | Status: DC
  Administered 2013-12-20 – 2013-12-27 (×7): 30 mg via ORAL
  Filled 2013-12-20 (×8): qty 1

## 2013-12-20 NOTE — Progress Notes (Signed)
PT Cancellation Note  Patient Details Name: [name] MRN: [id number] DOB: 1971-08-18   Cancelled Treatment:    Reason Eval/Treat Not Completed: Patient at procedure or test/unavailable;Other (comment)  Attempted x 3 this am--pt at MRI most of morning and now just beginning to eat lunch. Will attemt PT eval again as schedule permits   Neil Crouch 12/20/2013, 10:54 AM

## 2013-12-20 NOTE — Progress Notes (Signed)
Rehab Admissions Coordinator Note:  Patient was screened by Cleatrice Burke for appropriateness for an Inpatient Acute Rehab Consult per PT recommendations. At this time, we are recommending Inpatient Rehab consult. Please place order if you feel appropriate.  Audelia Acton [facility] 12/20/2013, 4:02 PM  I can be reached at [phone].

## 2013-12-20 NOTE — Evaluation (Signed)
Physical Therapy Evaluation Patient Details Name: [name] MRN: [id number] DOB: 08/23/1971 Today's Date: 12/20/2013   History of Present Illness  Pt adm with facial cellulitis and bil lower extremity neuropathy. Work up of neuropathy in progress.  Clinical Impression  Pt admitted with above. Pt currently with functional limitations due to the deficits listed below (see PT Problem List).  Pt will benefit from skilled PT to increase their independence and safety with mobility to allow discharge to the venue listed below. Feel pt can benefit from CIR in order to be able to return home.      Follow Up Recommendations CIR    Equipment Recommendations  Rolling walker with 5" wheels    Recommendations for Other Services       Precautions / Restrictions Precautions Precautions: Fall      Mobility  Bed Mobility Overal bed mobility: Needs Assistance Bed Mobility: Supine to Sit;Sit to Supine     Supine to sit: Supervision Sit to supine: Min assist   General bed mobility comments: Incr time to come to sitting and assist to bring legs back up into bed to return to supine.  Transfers Overall transfer level: Needs assistance Equipment used: Rolling walker (2 wheeled) Transfers: Sit to/from Stand Sit to Stand: Mod assist;+2 safety/equipment         General transfer comment: Assist to bring hips up.  Ambulation/Gait Ambulation/Gait assistance: +2 physical assistance;Mod assist Ambulation Distance (Feet): 2 Feet (forward and backward) Assistive device: Rolling walker (2 wheeled) Gait Pattern/deviations: Ataxic   Gait velocity interpretation: Below normal speed for age/gender General Gait Details: Assist for balance and to control ataxia. Pt with heavy reliance on arms due to pain in feet.  Stairs            Wheelchair Mobility    Modified Rankin (Stroke Patients Only)       Balance Overall balance assessment: Needs assistance Sitting-balance support: No upper  extremity supported;Feet unsupported Sitting balance-Leahy Scale: Good     Standing balance support: Bilateral upper extremity supported Standing balance-Leahy Scale: Poor                               Pertinent Vitals/Pain Severe pain in feet especially with change in position. Pt reports pain as burning.     Home Living Family/patient expects to be discharged to:: Private residence Living Arrangements: Children Available Help at Discharge: Family;Available PRN/intermittently         Home Layout: Two level;Able to live on main level with bedroom/bathroom Home Equipment: Wheelchair - manual      Prior Function Level of Independence: Independent         Comments: Until feet began bothering her 2 months ago pt independent. Mobility has been declining as pain incr and just prior to hospitalization pt using w/c to get from her bed to the bathroom.     Hand Dominance        Extremity/Trunk Assessment   Upper Extremity Assessment: Overall WFL for tasks assessed           Lower Extremity Assessment: RLE deficits/detail;LLE deficits/detail RLE Deficits / Details: Pt at least 3/5 in hip and knee but unable to put resistance due to pain. Dorsiflexion limited by pain. LLE Deficits / Details: Pt at least 3/5 in hip and knee but unable to put resistance due to pain. Dorsiflexion limited by pain.     Communication   Communication: No difficulties  Cognition  Arousal/Alertness: Awake/alert Behavior During Therapy: WFL for tasks assessed/performed Overall Cognitive Status: Within Functional Limits for tasks assessed                      General Comments      Exercises        Assessment/Plan    PT Assessment Patient needs continued PT services  PT Diagnosis Difficulty walking;Abnormality of gait   PT Problem List Decreased activity tolerance;Decreased balance;Decreased mobility;Decreased coordination;Decreased knowledge of use of DME;Impaired  sensation;Pain  PT Treatment Interventions DME instruction;Gait training;Functional mobility training;Therapeutic activities;Therapeutic exercise;Balance training;Patient/family education   PT Goals (Current goals can be found in the Care Plan section) Acute Rehab PT Goals Patient Stated Goal: to be able to walk PT Goal Formulation: With patient Time For Goal Achievement: 12/27/13 Potential to Achieve Goals: Good    Frequency Min 3X/week   Barriers to discharge Decreased caregiver support      Co-evaluation               End of Session   Activity Tolerance: Patient limited by pain Patient left: in bed;with call bell/phone within reach;with nursing/sitter in room Nurse Communication: Mobility status         Time: 1445-1500 PT Time Calculation (min): 15 min   Charges:   PT Evaluation $Initial PT Evaluation Tier I: 1 Procedure PT Treatments $Gait Training: 8-22 mins   PT G CodesShary Decamp Chancy Smigiel 12/20/2013, 3:46 PM  [employer] PT [phone]

## 2013-12-20 NOTE — Progress Notes (Signed)
Subjective: No changes over night with sensation or pain.  She does not that the inside of her mouth is "numb" but feels it was that way when she was seen by first neurologist.   Objective: Current vital signs: BP 145/91  Pulse 95  Temp(Src) 98.4 F (36.9 C) (Oral)  Resp 18  Ht $R'5\' 5"'es$  (1.651 m)  Wt 57.743 kg (127 lb 4.8 oz)  BMI 21.18 kg/m2  SpO2 96%  LMP 11/03/2013 Vital signs in last 24 hours: Temp:  [97.7 F (36.5 C)-98.4 F (36.9 C)] 98.4 F (36.9 C) (04/10 0524) Pulse Rate:  [95] 95 (04/10 0524) Resp:  [18-20] 18 (04/10 0524) BP: (145-155)/(91-94) 145/91 mmHg (04/10 0524) SpO2:  [95 %-96 %] 96 % (04/10 0524)  Intake/Output from previous day: 04/09 0701 - 04/10 0700 In: 200 [P.O.:200] Out: -  Intake/Output this shift:   Nutritional status: General  Neurologic Exam: Mental Status: Alert, oriented, thought content appropriate.  Speech fluent without evidence of aphasia.  Able to follow 3 step commands without difficulty. Cranial Nerves: II:  Visual fields grossly normal, pupils equal, round, reactive to light and accommodation III,IV, VI: ptosis not present, extra-ocular motions intact bilaterally V,VII: smile asymmetric on the right due to welling of face secondary to infection, facial light touch sensation normal bilaterally VIII: hearing normal bilaterally IX,X: gag reflex present XI: bilateral shoulder shrug XII: midline tongue extension without atrophy or fasciculations  Motor: Right : Upper extremity   5/5    Left:     Upper extremity   5/5  Lower extremity   4/5     Lower extremity   5/5 --distal leg strength difficult to test as patient is in significant pain. Allodynia is present in both feet up to ankle.   Tone and bulk:normal tone throughout; no atrophy noted Sensory: continues to have decreased sensation up to T4 in the anterior trunk but when testing sensation on her back I did not get a level  Deep Tendon Reflexes:  Right: Upper Extremity   Left:  Upper extremity   biceps (C-5 to C-6) 2/4   biceps (C-5 to C-6) 2/4 tricep (C7) 2/4    triceps (C7) 2/4 Brachioradialis (C6) 2/4  Brachioradialis (C6) 2/4  Lower Extremity Lower Extremity  quadriceps (L-2 to L-4) 0/4   quadriceps (L-2 to L-4) 0/4 Achilles (S1) 0/4   Achilles (S1) 0/4  Plantars: Right: downgoing   Left: downgoing Cerebellar: normal finger-to-nose,  Unable to test heel-to-shin due to pain    Lab Results: Basic Metabolic Panel:  Recent Labs Lab 12/18/13 1540 12/18/13 2331 12/19/13 0530 12/20/13 0653  NA 129*  --  133* 136*  K 3.7  --  3.9 3.2*  CL 86*  --  92* 95*  CO2 24  --  21 26  GLUCOSE 104*  --  90 93  BUN 9  --  8 5*  CREATININE 0.67 0.59 0.59 0.54  CALCIUM 8.4  --  7.8* 7.9*    Liver Function Tests:  Recent Labs Lab 12/18/13 1540 12/19/13 0530  AST 49* 44*  ALT 8 5  ALKPHOS 183* 150*  BILITOT 0.6 0.4  PROT 6.1 5.4*  ALBUMIN 2.6* 2.2*   No results found for this basename: LIPASE, AMYLASE,  in the last 168 hours No results found for this basename: AMMONIA,  in the last 168 hours  CBC:  Recent Labs Lab 12/18/13 1540 12/18/13 2331 12/19/13 0530 12/20/13 0653  WBC 24.2* 22.2* 19.3* 15.1*  NEUTROABS 19.2*  --   --   --  HGB 10.2* 8.8* 8.3* 8.5*  HCT 28.7* 25.8* 24.1* 24.9*  MCV 98.6 98.9 98.4 100.8*  PLT 397 366 336 309    Cardiac Enzymes: No results found for this basename: CKTOTAL, CKMB, CKMBINDEX, TROPONINI,  in the last 168 hours  Lipid Panel: No results found for this basename: CHOL, TRIG, HDL, CHOLHDL, VLDL, LDLCALC,  in the last 168 hours  CBG: No results found for this basename: GLUCAP,  in the last 168 hours  Microbiology: Results for orders placed during the hospital encounter of 12/18/13  CULTURE, BLOOD (ROUTINE X 2)     Status: None   Collection Time    12/18/13  7:48 PM      Result Value Ref Range Status   Specimen Description BLOOD ARM LEFT   Final   Special Requests BOTTLES DRAWN AEROBIC AND ANAEROBIC  10CC   Final   Culture  Setup Time     Final   Value: 12/19/2013 00:57     Performed at [facility]   Culture     Final   Value:        BLOOD CULTURE RECEIVED NO GROWTH TO DATE CULTURE WILL BE HELD FOR 5 DAYS BEFORE ISSUING A FINAL NEGATIVE REPORT     Performed at [facility]   Report Status PENDING   Incomplete  CULTURE, BLOOD (ROUTINE X 2)     Status: None   Collection Time    12/18/13  7:53 PM      Result Value Ref Range Status   Specimen Description BLOOD HAND LEFT   Final   Special Requests BOTTLES DRAWN AEROBIC ONLY 10CC   Final   Culture  Setup Time     Final   Value: 12/19/2013 00:58     Performed at [facility]   Culture     Final   Value:        BLOOD CULTURE RECEIVED NO GROWTH TO DATE CULTURE WILL BE HELD FOR 5 DAYS BEFORE ISSUING A FINAL NEGATIVE REPORT     Performed at [facility]   Report Status PENDING   Incomplete    Coagulation Studies:  Recent Labs  12/19/13 0530  LABPROT 13.6  INR 1.06    Imaging: Dg Orthopantogram  12/19/2013   CLINICAL DATA:  Facial cellulitis  EXAM: ORTHOPANTOGRAM/PANORAMIC  COMPARISON:  CT 12/18/2013  FINDINGS: Multiple caries especially right lower molars and left upper third molar.  Periapical lucency around two right lower molars consistent with infection. This is noted on the CT.  Negative for fracture or mass.  IMPRESSION: Dental caries.  Periapical lucency around two right lower molars consistent with infection.   Electronically Signed   By: Franchot Gallo M.D.   On: 12/19/2013 09:09   Mr Cervical Spine W Wo Contrast  12/20/2013   CLINICAL DATA:  Lower extremity pain this started 2 months prior to admission progressively getting worse. She relates some lower extremity weakness. No recent viral infections, no fever chills nausea vomiting or diarrhea. She also started developing some numbness in her upper extremities phalanges. And some numbness in the mouth. She said that 3 days prior to admission she  also developed redness in her face and the small pimple directed causing facial swelling redness and no tenderness.  EXAM: MRI CERVICAL SPINE WITHOUT AND WITH CONTRAST  TECHNIQUE: Multiplanar and multiecho pulse sequences of the cervical spine, to include the craniocervical junction and cervicothoracic junction, were obtained according to standard protocol without and with intravenous contrast.  CONTRAST:  21mL MULTIHANCE GADOBENATE DIMEGLUMINE 529 MG/ML IV SOLN  COMPARISON:  None.  FINDINGS: There is significant patient motion degrading image quality limiting evaluation.  The cervical cord is normal in size and signal. Vertebral body heights are maintained. The disc spaces are preserved. The cervical spine is normal in lordotic alignment. No static listhesis. Bone marrow signal is normal. Cerebellar tonsils are normal in position. No areas of abnormal enhancement.  C2-3: No significant disc bulge. No neural foraminal stenosis. No central canal stenosis.  C3-4: No significant disc bulge. No neural foraminal stenosis. No central canal stenosis.  C4-5: No significant disc bulge. No neural foraminal stenosis. No central canal stenosis.  C5-6: No significant disc bulge. No neural foraminal stenosis. No central canal stenosis.  C6-7: No significant disc bulge. No neural foraminal stenosis. No central canal stenosis.  C7-T1: No significant disc bulge. No neural foraminal stenosis. No central canal stenosis.  IMPRESSION: Normal MRI of the cervical spine.   Electronically Signed   By: Kathreen Devoid   On: 12/20/2013 12:42   Mr Thoracic Spine W Wo Contrast  12/20/2013   CLINICAL DATA:  Lower extremity pain this started 2 months prior to admission progressively getting worse. She relates some lower extremity weakness. No recent viral infections, no fever chills nausea vomiting or diarrhea. She also started developing some numbness in her upper extremities phalanges. And some numbness in the mouth. She said that 3 days prior  to admission she also developed redness in her face and the small pimple directed causing facial swelling redness and no tenderness.  EXAM: MRI THORACIC SPINE WITHOUT AND WITH CONTRAST  TECHNIQUE: Multiplanar and multiecho pulse sequences of the thoracic spine were obtained without and with intravenous contrast.  CONTRAST:  47mL MULTIHANCE GADOBENATE DIMEGLUMINE 529 MG/ML IV SOLN  COMPARISON:  None.  FINDINGS: The vertebral body heights are maintained. The alignment is anatomic. There is no vertebral body compression fracture. The thoracic spinal cord is normal in size and signal. The conus medullaris terminates at T12. There are no areas of abnormal enhancement. The disc spaces are maintained. There is no disc protrusion or foraminal stenosis. There is no central canal stenosis. There is a mild right paracentral broad-based disc bulge at T8-9.  IMPRESSION: 1. The thoracic spinal cord is normal in size and signal. No abnormal enhancement 2. Mild right paracentral disc bulge at T8-9.   Electronically Signed   By: Kathreen Devoid   On: 12/20/2013 12:46   Ct Maxillofacial W/cm  12/18/2013   CLINICAL DATA:  Right-sided facial pain and swelling.  EXAM: CT MAXILLOFACIAL WITH CONTRAST  TECHNIQUE: Multidetector CT imaging of the maxillofacial structures was performed with intravenous contrast. Multiplanar CT image reconstructions were also generated. A small metallic BB was placed on the right temple in order to reliably differentiate right from left.  CONTRAST:  25mL OMNIPAQUE IOHEXOL 300 MG/ML  SOLN  COMPARISON:  None.  FINDINGS: There is a focal area of subcutaneous soft tissue swelling/ edema involving the right facial area overlying the right mandible and maxilla. No discrete drainable soft tissue abscess.  There is extensive dental disease with dental caries and right-sided periapical lucency involving the mandibular molars. No abnormality in the floor of the mouth. The tongue base is normal.  The parotid and  submandibular glands are normal. The epiglottis is normal. The paraglottic fat planes are maintained. The piriform sinus and vallecular air spaces are normal.  Small scattered bilateral neck nodes but no mass or adenopathy.  The  visualized portion of the brain is unremarkable. The paranasal sinuses and mastoid air cells are clear. Globes are intact.  IMPRESSION: Right facial cellulitis without discrete abscess. This is due to periapical abscess involving the right mandibular molar.   Electronically Signed   By: Kalman Jewels M.D.   On: 12/18/2013 18:03    Medications:  Scheduled: . ampicillin-sulbactam (UNASYN) IV  3 g Intravenous Q6H  . DULoxetine  30 mg Oral Daily  . folic acid  1 mg Oral Daily  . gabapentin  300 mg Oral TID  . heparin  5,000 Units Subcutaneous 3 times per day  . multivitamin with minerals  1 tablet Oral Daily  . polyethylene glycol  17 g Oral Daily  . thiamine  100 mg Oral Daily   Or  . thiamine  100 mg Intravenous Daily  . vancomycin  750 mg Intravenous Q12H    Assessment/Plan: Patient has remained stable.  MRI of the cervical and thoracic spine is unremarkable.  Patient also describes a change in taste though and perioral numbness.  Has had no brain imaging to rule out possibilities such as MS.  Further metabolic causes should be considered as well.  ESR, B12, TSH are unremarkable.    Recommendations: 1.  MRI of the brain with and without contrast 2.  A1c, SPEP, anti-Hu, heavy metal screen, RPR, B1.   3.  Muscle and nerve biopsy  Case discussed with Dr. Sloan Leiter    LOS: 2 days   Alexis Goodell, MD [facility] [phone]  12/20/2013  1:35 PM

## 2013-12-20 NOTE — Progress Notes (Addendum)
PATIENT DETAILS Name: [name] Age: 43 y.o. Sex: female Date of Birth: 08/06/1971 Admit Date: 12/18/2013 Admitting Physician Charlynne Cousins, MD PCP:No PCP Per Patient  Subjective: Left facial swelling has decreased.  Assessment/Plan: Active Problems:   Facial cellulitis - Admitted, and started on vancomycin on 4/8, since foci is likely dental caries, added Unasyn for anaerobic coverage on 4/9-continue -Leukocytosis improving,however is afebrile. CT of the facial region.admit on admission was negative for abscess. Suspect will need to see a dentist to have some of her culprit tooth removed. Since clincally improving, will hold off on consulting oral surgery at present   Systemic inflammatory response syndrome - Secondary to above, treated with antibiotics.  Bilateral lower extremity neuropathy - Neurology consulted, workup in progress. Await MRI of the cervical/thoracic spine, ANA. Etiology unknown, however suspicion for alcoholic neuropathy. - RPR nonreactive, HIV nonreactive, ESR only at 5, rheumatoid factor negative. Vitamin B12 at 381. - Increase Neurontin to $RemoveBefo'300mg'fyxNLTOyhEz$  TID and start Cymbalta, will need to minimize/taper narcotics slowly - PT evaluation  Hyponatremia - Likely secondary to dehydration, better with IV fluids.  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 Claims to drink on weekends and one or twice in the weekdays  Anemia -check Anemia panel  Disposition: Remain inpatient  DVT Prophylaxis: Prophylactic Heparin  Code Status: Full code   Family Communication Mother at bedside  Procedures:  None  CONSULTS:  neurology  Time spent 40 minutes-which includes 50% of the time with face-to-face with patient/ family and coordinating care related to the above assessment and plan.  MEDICATIONS: Scheduled Meds: . ampicillin-sulbactam (UNASYN) IV  3 g Intravenous X8V  . folic acid  1 mg  Oral Daily  . gabapentin  300 mg Oral BID  . heparin  5,000 Units Subcutaneous 3 times per day  . multivitamin with minerals  1 tablet Oral Daily  . potassium chloride  40 mEq Oral Once  . thiamine  100 mg Oral Daily   Or  . thiamine  100 mg Intravenous Daily  . vancomycin  750 mg Intravenous Q12H   Continuous Infusions:   PRN Meds:.acetaminophen, acetaminophen, HYDROcodone-acetaminophen, LORazepam, LORazepam, morphine injection, ondansetron (ZOFRAN) IV, ondansetron, polyethylene glycol  Antibiotics: Anti-infectives   Start     Dose/Rate Route Frequency Ordered Stop   12/19/13 0930  Ampicillin-Sulbactam (UNASYN) 3 g in sodium chloride 0.9 % 100 mL IVPB     3 g 100 mL/hr over 60 Minutes Intravenous Every 6 hours 12/19/13 0826     12/19/13 0600  vancomycin (VANCOCIN) IVPB 750 mg/150 ml premix     750 mg 150 mL/hr over 60 Minutes Intravenous Every 12 hours 12/18/13 1808     12/18/13 1815  vancomycin (VANCOCIN) IVPB 1000 mg/200 mL premix     1,000 mg 200 mL/hr over 60 Minutes Intravenous NOW 12/18/13 1807 12/18/13 2006   12/18/13 1545  clindamycin (CLEOCIN) IVPB 600 mg     600 mg 100 mL/hr over 30 Minutes Intravenous  Once 12/18/13 1539 12/18/13 1632       PHYSICAL EXAM: Vital signs in last 24 hours: Filed Vitals:   12/19/13 0611 12/19/13 1337 12/20/13 0050 12/20/13 0524  BP: 153/87 148/94 155/94 145/91  Pulse: 94 95 95 95  Temp: 98 F (36.7 C) 97.7 F (36.5 C) 97.7 F (36.5 C) 98.4 F (36.9 C)  TempSrc: Oral Oral Oral Oral  Resp: $Remo'20 20 18 'VgwKo$ 18  Height:      Weight:      SpO2: 99% 95% 95% 96%    Weight change:  Filed Weights   12/18/13 2016  Weight: 57.743 kg (127 lb 4.8 oz)   Body mass index is 21.18 kg/(m^2).   Gen Exam: Awake and alert with clear speech. Right facial swelling significantly decreased, +dental carries Neck: Supple, No JVD.   Chest: B/L Clear.   CVS: S1 S2 Regular, no murmurs.  Abdomen: soft, BS +, non tender, non distended.  Extremities: no  edema, lower extremities warm to touch. Neurologic: Non Focal.   Skin: No Rash.  Wounds: N/A.    Intake/Output from previous day: No intake or output data in the 24 hours ending 12/20/13 1013   LAB RESULTS: CBC  Recent Labs Lab 12/18/13 1540 12/18/13 2331 12/19/13 0530 12/20/13 0653  WBC 24.2* 22.2* 19.3* 15.1*  HGB 10.2* 8.8* 8.3* 8.5*  HCT 28.7* 25.8* 24.1* 24.9*  PLT 397 366 336 309  MCV 98.6 98.9 98.4 100.8*  MCH 35.1* 33.7 33.9 34.4*  MCHC 35.5 34.1 34.4 34.1  RDW 15.4 15.3 15.2 15.6*  LYMPHSABS 2.8  --   --   --   MONOABS 2.1*  --   --   --   EOSABS 0.1  --   --   --   BASOSABS 0.1  --   --   --     Chemistries   Recent Labs Lab 12/18/13 1540 12/18/13 2331 12/19/13 0530 12/20/13 0653  NA 129*  --  133* 136*  K 3.7  --  3.9 3.2*  CL 86*  --  92* 95*  CO2 24  --  21 26  GLUCOSE 104*  --  90 93  BUN 9  --  8 5*  CREATININE 0.67 0.59 0.59 0.54  CALCIUM 8.4  --  7.8* 7.9*    CBG: No results found for this basename: GLUCAP,  in the last 168 hours  GFR Estimated Creatinine Clearance: 82.4 ml/min (by C-G formula based on Cr of 0.54).  Coagulation profile  Recent Labs Lab 12/19/13 0530  INR 1.06    Cardiac Enzymes No results found for this basename: CK, CKMB, TROPONINI, MYOGLOBIN,  in the last 168 hours  No components found with this basename: POCBNP,  No results found for this basename: DDIMER,  in the last 72 hours No results found for this basename: HGBA1C,  in the last 72 hours No results found for this basename: CHOL, HDL, LDLCALC, TRIG, CHOLHDL, LDLDIRECT,  in the last 72 hours No results found for this basename: TSH, T4TOTAL, FREET3, T3FREE, THYROIDAB,  in the last 72 hours  Recent Labs  12/18/13 2331  VITAMINB12 381   No results found for this basename: LIPASE, AMYLASE,  in the last 72 hours  Urine Studies No results found for this basename: UACOL, UAPR, USPG, UPH, UTP, UGL, UKET, UBIL, UHGB, UNIT, UROB, ULEU, UEPI, UWBC, URBC,  UBAC, CAST, CRYS, UCOM, BILUA,  in the last 72 hours  MICROBIOLOGY: Recent Results (from the past 240 hour(s))  CULTURE, BLOOD (ROUTINE X 2)     Status: None   Collection Time    12/18/13  7:48 PM      Result Value Ref Range Status   Specimen Description BLOOD ARM LEFT   Final   Special Requests BOTTLES DRAWN AEROBIC AND ANAEROBIC 10CC   Final   Culture  Setup Time     Final   Value: 12/19/2013 00:57  Performed at [facility]     Final   Value:        BLOOD CULTURE RECEIVED NO GROWTH TO DATE CULTURE WILL BE HELD FOR 5 DAYS BEFORE ISSUING A FINAL NEGATIVE REPORT     Performed at [facility]   Report Status PENDING   Incomplete  CULTURE, BLOOD (ROUTINE X 2)     Status: None   Collection Time    12/18/13  7:53 PM      Result Value Ref Range Status   Specimen Description BLOOD HAND LEFT   Final   Special Requests BOTTLES DRAWN AEROBIC ONLY 10CC   Final   Culture  Setup Time     Final   Value: 12/19/2013 00:58     Performed at [facility]   Culture     Final   Value:        BLOOD CULTURE RECEIVED NO GROWTH TO DATE CULTURE WILL BE HELD FOR 5 DAYS BEFORE ISSUING A FINAL NEGATIVE REPORT     Performed at [facility]   Report Status PENDING   Incomplete    RADIOLOGY STUDIES/RESULTS: Dg Orthopantogram  12/19/2013   CLINICAL DATA:  Facial cellulitis  EXAM: ORTHOPANTOGRAM/PANORAMIC  COMPARISON:  CT 12/18/2013  FINDINGS: Multiple caries especially right lower molars and left upper third molar.  Periapical lucency around two right lower molars consistent with infection. This is noted on the CT.  Negative for fracture or mass.  IMPRESSION: Dental caries.  Periapical lucency around two right lower molars consistent with infection.   Electronically Signed   By: Franchot Gallo M.D.   On: 12/19/2013 09:09   Dg Chest 2 View  11/19/2013   CLINICAL DATA Chest pain.  EXAM CHEST  2 VIEW  COMPARISON None.  FINDINGS No cardiomegaly. Unremarkable mediastinal  contours. No infiltrate, edema, effusion, or pneumothorax. There is an apparent 22 mm long lucency in the posterior right fifth rib. This could be related to a true expansile bone lesion or summation of lung markings.  IMPRESSION 1. No active cardiopulmonary disease. 2. Questionable expansile bone lesion in the posterior right fifth rib. Recommend dedicated rib series for confirmation (would like to exclude CT radiation due to young age).  SIGNATURE  Electronically Signed   By: Jorje Guild M.D.   On: 11/19/2013 13:27   Dg Ribs Unilateral Right  11/19/2013   CLINICAL DATA Lower rib pain, fall 3 weeks ago  EXAM RIGHT RIBS - 2 VIEW  COMPARISON 11/19/2013  FINDINGS Radiopaque marker over the right lower ribs. No displaced fracture or definite focal rib abnormality. Inferior right ribs appear intact. Right lung clear. No effusion or pneumothorax. No chest wall asymmetry or subcutaneous emphysema.  IMPRESSION No acute osseous finding  SIGNATURE  Electronically Signed   By: Daryll Brod M.D.   On: 11/19/2013 16:57   Ct Maxillofacial W/cm  12/18/2013   CLINICAL DATA:  Right-sided facial pain and swelling.  EXAM: CT MAXILLOFACIAL WITH CONTRAST  TECHNIQUE: Multidetector CT imaging of the maxillofacial structures was performed with intravenous contrast. Multiplanar CT image reconstructions were also generated. A small metallic BB was placed on the right temple in order to reliably differentiate right from left.  CONTRAST:  50mL OMNIPAQUE IOHEXOL 300 MG/ML  SOLN  COMPARISON:  None.  FINDINGS: There is a focal area of subcutaneous soft tissue swelling/ edema involving the right facial area overlying the right mandible and maxilla. No discrete drainable soft tissue abscess.  There is  extensive dental disease with dental caries and right-sided periapical lucency involving the mandibular molars. No abnormality in the floor of the mouth. The tongue base is normal.  The parotid and submandibular glands are normal. The  epiglottis is normal. The paraglottic fat planes are maintained. The piriform sinus and vallecular air spaces are normal.  Small scattered bilateral neck nodes but no mass or adenopathy.  The visualized portion of the brain is unremarkable. The paranasal sinuses and mastoid air cells are clear. Globes are intact.  IMPRESSION: Right facial cellulitis without discrete abscess. This is due to periapical abscess involving the right mandibular molar.   Electronically Signed   By: Kalman Jewels M.D.   On: 12/18/2013 18:03    Christoffer Currier Kristeen Mans, MD  [facility] Pager:[phone]  If 7PM-7AM, please contact night-coverage [email or web address] Password [email or web address] 12/20/2013, 10:13 AM   LOS: 2 days

## 2013-12-20 NOTE — Clinical Social Work Note (Signed)
CSW met with patient at bedside to update on LOG bed search. CSW informed patient that [facility] in [place] is strongly considering patient for placement. Facility states that they will want to "cost out" the patient's medications with their pharmacy. CSW has updated MD.  Liz Beach MSW, [place], [place], [phone]

## 2013-12-20 NOTE — Progress Notes (Signed)
Pt had a dime size blood clot come out of nose while blowing her nose. MD aware.

## 2013-12-21 DIAGNOSIS — F1010 Alcohol abuse, uncomplicated: Secondary | ICD-10-CM

## 2013-12-21 LAB — BASIC METABOLIC PANEL
BUN: 3 mg/dL — ABNORMAL LOW (ref 6–23)
CO2: 27 mEq/L (ref 19–32)
Calcium: 8.1 mg/dL — ABNORMAL LOW (ref 8.4–10.5)
Chloride: 95 mEq/L — ABNORMAL LOW (ref 96–112)
Creatinine, Ser: 0.45 mg/dL — ABNORMAL LOW (ref 0.50–1.10)
GFR calc Af Amer: >90 mL/min (ref >90)
GFR calc non Af Amer: >90 mL/min (ref >90)
Glucose, Bld: 89 mg/dL (ref 70–99)
Potassium: 3.2 mEq/L — ABNORMAL LOW (ref 3.7–5.3)
Sodium: 135 mEq/L — ABNORMAL LOW (ref 137–147)

## 2013-12-21 LAB — CBC
HCT: 24.1 % — ABNORMAL LOW (ref 36.0–46.0)
Hemoglobin: 8.1 g/dL — ABNORMAL LOW (ref 12.0–15.0)
MCH: 34 pg (ref 26.0–34.0)
MCHC: 33.6 g/dL (ref 30.0–36.0)
MCV: 101.3 fL — ABNORMAL HIGH (ref 78.0–100.0)
Platelets: 304 10*3/uL (ref 150–400)
RBC: 2.38 MIL/uL — ABNORMAL LOW (ref 3.87–5.11)
RDW: 15.6 % — ABNORMAL HIGH (ref 11.5–15.5)
WBC: 13.7 10*3/uL — ABNORMAL HIGH (ref 4.0–10.5)

## 2013-12-21 MED ORDER — POTASSIUM CHLORIDE CRYS ER 20 MEQ PO TBCR
40.0000 meq | EXTENDED_RELEASE_TABLET | Freq: Once | ORAL | Status: AC
  Administered 2013-12-21: 40 meq via ORAL
  Filled 2013-12-21: qty 2

## 2013-12-21 MED ORDER — VITAMIN B-12 1000 MCG PO TABS
1000.0000 ug | ORAL_TABLET | Freq: Every day | ORAL | Status: DC
  Administered 2013-12-21 – 2013-12-27 (×6): 1000 ug via ORAL
  Filled 2013-12-21 (×7): qty 1

## 2013-12-21 NOTE — Progress Notes (Signed)
While RN helping pt to [facility], pt lost feeling in legs and legs gave out. Pt was slowly lowered to knees to floor by RN at 0430. NT, CN, and another RN called to help assist pt off knees and to [facility]. After pt finished using bathroom, 3 RNs helped pt back to bed. VS taken & on call physician Donnal Debar) notified of [phone]. Pt reported no injury and felt that there was nothing that could have been done to prevent the fall. Pt was reeducated on fall prevention. Pt currently resting comfortably in bed.

## 2013-12-21 NOTE — Progress Notes (Signed)
Aware of patient. Unfortunately, we do not have time for a biopsy this weekend.  We will see later this weekend or Monday and try to schedule something then.  [name] 10:54 AM 12/21/2013

## 2013-12-21 NOTE — Progress Notes (Signed)
Physical Therapy Treatment Patient Details Name: [name] MRN: [id number] DOB: 07-Aug-1971 Today's Date: 12/21/2013    History of Present Illness Pt adm with facial cellulitis and bil lower extremity neuropathy. Work up of neuropathy in progress.    PT Comments    Pt very motivated to progress mobility but is greatly limited due to pain and numbness in bil feet. Pt continues to be great candidate for CIR to maximize independence with mobility prior to returning home. Surgery planning to consult at next available time.   Follow Up Recommendations  CIR     Equipment Recommendations  Rolling walker with 5" wheels    Recommendations for Other Services OT consult     Precautions / Restrictions Precautions Precautions: Fall Restrictions Weight Bearing Restrictions: No    Mobility  Bed Mobility Overal bed mobility: Modified Independent Bed Mobility: Supine to Sit     Supine to sit: Modified independent (Device/Increase time);HOB elevated     General bed mobility comments: relied on handrails; required incr time due to pain   Transfers Overall transfer level: Needs assistance Equipment used: Rolling walker (2 wheeled) Transfers: Sit to/from Stand Sit to Stand: Min assist;+2 physical assistance         General transfer comment: 2 person (A) for safety and to maintain balance; cues for hand placment and sequencing with RW  Ambulation/Gait Ambulation/Gait assistance: +2 physical assistance;Min assist Ambulation Distance (Feet): 16 Feet (4 feet forward and backward; then 8 feet after rest) Assistive device: Rolling walker (2 wheeled) Gait Pattern/deviations: Step-to pattern;Ataxic;Narrow base of support;Trunk flexed (decr foot flat on Rt LE ) Gait velocity: very decr due to pain Gait velocity interpretation: Below normal speed for age/gender General Gait Details: pt requires 2 person (A) to maintain balance and manage RW; pt with decr foot flat on Rt LE and ataxic  secondary to numbness and decreased sensation on bil feet; able to rest and increase ambulation this session    Stairs            Wheelchair Mobility    Modified Rankin (Stroke Patients Only)       Balance Overall balance assessment: Needs assistance Sitting-balance support: Feet supported;No upper extremity supported Sitting balance-Leahy Scale: Good     Standing balance support: During functional activity;Bilateral upper extremity supported Standing balance-Leahy Scale: Poor Standing balance comment: pt requires bil UE support frow RW              High level balance activites: Direction changes High Level Balance Comments: requires (A) to manage RW     Cognition Arousal/Alertness: Awake/alert Behavior During Therapy: WFL for tasks assessed/performed Overall Cognitive Status: Within Functional Limits for tasks assessed                      Exercises      General Comments        Pertinent Vitals/Pain Denies any pain; c/o complete numbness in bil feet.     Home Living                      Prior Function            PT Goals (current goals can now be found in the care plan section) Acute Rehab PT Goals Patient Stated Goal: to be able to walk PT Goal Formulation: With patient Time For Goal Achievement: 12/27/13 Potential to Achieve Goals: Good Progress towards PT goals: Progressing toward goals    Frequency  Min 3X/week  PT Plan Current plan remains appropriate    Co-evaluation             End of Session Equipment Utilized During Treatment: Gait belt Activity Tolerance: Patient limited by pain Patient left: in chair;with call bell/phone within reach     Time: 1041-1058 PT Time Calculation (min): 17 min  Charges:  $Gait Training: 8-22 mins                    G CodesKennis Carina [place], [place] [phone] 12/21/2013, 1:05 PM

## 2013-12-21 NOTE — Progress Notes (Signed)
PATIENT DETAILS Name: [name] Age: 43 y.o. Sex: female Date of Birth: [birth year] Admit Date: 12/18/2013 Admitting Physician Charlynne Cousins, MD PCP:No PCP Per Patient  Subjective: Left facial swelling has decreased.  Assessment/Plan: Active Problems:   Facial cellulitis- Right - Admitted, and started on vancomycin on 4/8, since foci is likely dental caries, added Unasyn for anaerobic coverage on 4/9-continue. Clinically much improved, leukocytoosis is improving, and patient is afebrile. Significantly decreased in the right sided swelling and erythema, now has to small indurated areas in the right upper lip, that is intermittently draining purulent material. Curbsided ENT-Dr Janace Hoard on 4/11, who quickly eyeballed the patient, and suggested that we c/w Abx, he did not think a I&D was warranted at this time.CT of the facial region done on admission was negative for abscess -. Suspect will need to see a dentist to have some of her culprit tooth removed. Since clincally improving, will hold off on consulting oral surgery at present   Systemic inflammatory response syndrome - Secondary to above, treated with antibiotics.  Bilateral lower extremity neuropathy - Neurology consulted, workup in progress.Etiology unknown, MRI of the cervical/thoracic spine negative for acute abnormalities, ANA negative.  RPR nonreactive, HIV nonreactive, ESR only at 5, rheumatoid factor negative. Vitamin B12 at 381. - Increased Neurontin to $RemoveBefo'300mg'sLnlnOnTfgC$  TID and started Cymbalta, will need to minimize/taper narcotics slowly - PT evaluation done-CIR recommended-consultation placed. -Neuro now recommending Muscle/Nerve Bx-will consult [facility]  Hyponatremia - Likely secondary to dehydration, resolved with IV fluids.  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 Claims to drink on weekends and one or twice in the weekdays  Anemia -Anemia panel consistent  with Anemia of Chronic disease,likely worsened by acute illness -No indication for transfusion at this time  Disposition: Remain inpatient  DVT Prophylaxis: Prophylactic Heparin  Code Status: Full code   Family Communication Mother at bedside  Procedures:  None  CONSULTS:  neurology  Time spent 40 minutes-which includes 50% of the time with face-to-face with patient/ family and coordinating care related to the above assessment and plan.  MEDICATIONS: Scheduled Meds: . ampicillin-sulbactam (UNASYN) IV  3 g Intravenous Q6H  . DULoxetine  30 mg Oral Daily  . folic acid  1 mg Oral Daily  . gabapentin  300 mg Oral TID  . heparin  5,000 Units Subcutaneous 3 times per day  . multivitamin with minerals  1 tablet Oral Daily  . polyethylene glycol  17 g Oral Daily  . potassium chloride  40 mEq Oral Once  . thiamine  100 mg Oral Daily   Or  . thiamine  100 mg Intravenous Daily  . vancomycin  750 mg Intravenous Q12H   Continuous Infusions:   PRN Meds:.acetaminophen, acetaminophen, HYDROcodone-acetaminophen, LORazepam, LORazepam, morphine injection, ondansetron (ZOFRAN) IV, ondansetron  Antibiotics: Anti-infectives   Start     Dose/Rate Route Frequency Ordered Stop   12/19/13 0930  Ampicillin-Sulbactam (UNASYN) 3 g in sodium chloride 0.9 % 100 mL IVPB     3 g 100 mL/hr over 60 Minutes Intravenous Every 6 hours 12/19/13 0826     12/19/13 0600  vancomycin (VANCOCIN) IVPB 750 mg/150 ml premix     750 mg 150 mL/hr over 60 Minutes Intravenous Every 12 hours 12/18/13 1808     12/18/13 1815  vancomycin (VANCOCIN) IVPB 1000 mg/200 mL premix     1,000 mg 200 mL/hr over 60 Minutes Intravenous NOW  12/18/13 1807 12/18/13 2006   12/18/13 1545  clindamycin (CLEOCIN) IVPB 600 mg     600 mg 100 mL/hr over 30 Minutes Intravenous  Once 12/18/13 1539 12/18/13 1632       PHYSICAL EXAM: Vital signs in last 24 hours: Filed Vitals:   12/20/13 1512 12/20/13 2340 12/21/13 0435 12/21/13  0637  BP: 155/90 150/94 182/106 159/91  Pulse:  84 88 81  Temp:  97.6 F (36.4 C) 97.5 F (36.4 C) 97.4 F (36.3 C)  TempSrc:  Oral Oral Oral  Resp:  $Remo'18 16 18  'wwaoU$ Height:      Weight:      SpO2:  96% 96% 96%    Weight change:  Filed Weights   12/18/13 2016  Weight: 57.743 kg (127 lb 4.8 oz)   Body mass index is 21.18 kg/(m^2).   Gen Exam: Awake and alert with clear speech. Right facial swelling significantly decreased, +dental carries Neck: Supple, No JVD.   Chest: B/L Clear.   CVS: S1 S2 Regular, no murmurs.  Abdomen: soft, BS +, non tender, non distended.  Extremities: no edema, lower extremities warm to touch. Neurologic: Non Focal.   Skin: No Rash.  Wounds: N/A.    Intake/Output from previous day:  Intake/Output Summary (Last 24 hours) at 12/21/13 0949 Last data filed at 12/20/13 1357  Gross per 24 hour  Intake    340 ml  Output      0 ml  Net    340 ml     LAB RESULTS: CBC  Recent Labs Lab 12/18/13 1540 12/18/13 2331 12/19/13 0530 12/20/13 0653 12/21/13 0750  WBC 24.2* 22.2* 19.3* 15.1* 13.7*  HGB 10.2* 8.8* 8.3* 8.5* 8.1*  HCT 28.7* 25.8* 24.1* 24.9* 24.1*  PLT 397 366 336 309 304  MCV 98.6 98.9 98.4 100.8* 101.3*  MCH 35.1* 33.7 33.9 34.4* 34.0  MCHC 35.5 34.1 34.4 34.1 33.6  RDW 15.4 15.3 15.2 15.6* 15.6*  LYMPHSABS 2.8  --   --   --   --   MONOABS 2.1*  --   --   --   --   EOSABS 0.1  --   --   --   --   BASOSABS 0.1  --   --   --   --     Chemistries   Recent Labs Lab 12/18/13 1540 12/18/13 2331 12/19/13 0530 12/20/13 0653 12/20/13 1238 12/21/13 0750  NA 129*  --  133* 136*  --  135*  K 3.7  --  3.9 3.2*  --  3.2*  CL 86*  --  92* 95*  --  95*  CO2 24  --  21 26  --  27  GLUCOSE 104*  --  90 93  --  89  BUN 9  --  8 5*  --  3*  CREATININE 0.67 0.59 0.59 0.54  --  0.45*  CALCIUM 8.4  --  7.8* 7.9*  --  8.1*  MG  --   --   --   --  1.6  --     CBG: No results found for this basename: GLUCAP,  in the last 168  hours  GFR Estimated Creatinine Clearance: 82.4 ml/min (by C-G formula based on Cr of 0.45).  Coagulation profile  Recent Labs Lab 12/19/13 0530  INR 1.06    Cardiac Enzymes No results found for this basename: CK, CKMB, TROPONINI, MYOGLOBIN,  in the last 168 hours  No components found with this basename:  POCBNP,  No results found for this basename: DDIMER,  in the last 72 hours  Recent Labs  12/20/13 1238  HGBA1C 4.8   No results found for this basename: CHOL, HDL, LDLCALC, TRIG, CHOLHDL, LDLDIRECT,  in the last 72 hours  Recent Labs  12/20/13 1238  TSH 3.570    Recent Labs  12/18/13 2331 12/20/13 1238  VITAMINB12 381 394  FOLATE  --  >20.0  FERRITIN  --  656*  TIBC  --  155*  IRON  --  79  RETICCTPCT  --  6.2*   No results found for this basename: LIPASE, AMYLASE,  in the last 72 hours  Urine Studies No results found for this basename: UACOL, UAPR, USPG, UPH, UTP, UGL, UKET, UBIL, UHGB, UNIT, UROB, ULEU, UEPI, UWBC, URBC, UBAC, CAST, CRYS, UCOM, BILUA,  in the last 72 hours  MICROBIOLOGY: Recent Results (from the past 240 hour(s))  CULTURE, BLOOD (ROUTINE X 2)     Status: None   Collection Time    12/18/13  7:48 PM      Result Value Ref Range Status   Specimen Description BLOOD ARM LEFT   Final   Special Requests BOTTLES DRAWN AEROBIC AND ANAEROBIC 10CC   Final   Culture  Setup Time     Final   Value: 12/19/2013 00:57     Performed at [facility]   Culture     Final   Value:        BLOOD CULTURE RECEIVED NO GROWTH TO DATE CULTURE WILL BE HELD FOR 5 DAYS BEFORE ISSUING A FINAL NEGATIVE REPORT     Performed at [facility]   Report Status PENDING   Incomplete  CULTURE, BLOOD (ROUTINE X 2)     Status: None   Collection Time    12/18/13  7:53 PM      Result Value Ref Range Status   Specimen Description BLOOD HAND LEFT   Final   Special Requests BOTTLES DRAWN AEROBIC ONLY 10CC   Final   Culture  Setup Time     Final   Value:  12/19/2013 00:58     Performed at [facility]   Culture     Final   Value:        BLOOD CULTURE RECEIVED NO GROWTH TO DATE CULTURE WILL BE HELD FOR 5 DAYS BEFORE ISSUING A FINAL NEGATIVE REPORT     Performed at [facility]   Report Status PENDING   Incomplete    RADIOLOGY STUDIES/RESULTS: Dg Orthopantogram  12/19/2013   CLINICAL DATA:  Facial cellulitis  EXAM: ORTHOPANTOGRAM/PANORAMIC  COMPARISON:  CT 12/18/2013  FINDINGS: Multiple caries especially right lower molars and left upper third molar.  Periapical lucency around two right lower molars consistent with infection. This is noted on the CT.  Negative for fracture or mass.  IMPRESSION: Dental caries.  Periapical lucency around two right lower molars consistent with infection.   Electronically Signed   By: Franchot Gallo M.D.   On: 12/19/2013 09:09   Dg Chest 2 View  11/19/2013   CLINICAL DATA Chest pain.  EXAM CHEST  2 VIEW  COMPARISON None.  FINDINGS No cardiomegaly. Unremarkable mediastinal contours. No infiltrate, edema, effusion, or pneumothorax. There is an apparent 22 mm long lucency in the posterior right fifth rib. This could be related to a true expansile bone lesion or summation of lung markings.  IMPRESSION 1. No active cardiopulmonary disease. 2. Questionable expansile bone lesion in the posterior  right fifth rib. Recommend dedicated rib series for confirmation (would like to exclude CT radiation due to young age).  SIGNATURE  Electronically Signed   By: Jorje Guild M.D.   On: 11/19/2013 13:27   Dg Ribs Unilateral Right  11/19/2013   CLINICAL DATA Lower rib pain, fall 3 weeks ago  EXAM RIGHT RIBS - 2 VIEW  COMPARISON 11/19/2013  FINDINGS Radiopaque marker over the right lower ribs. No displaced fracture or definite focal rib abnormality. Inferior right ribs appear intact. Right lung clear. No effusion or pneumothorax. No chest wall asymmetry or subcutaneous emphysema.  IMPRESSION No acute osseous finding   SIGNATURE  Electronically Signed   By: Daryll Brod M.D.   On: 11/19/2013 16:57   Ct Maxillofacial W/cm  12/18/2013   CLINICAL DATA:  Right-sided facial pain and swelling.  EXAM: CT MAXILLOFACIAL WITH CONTRAST  TECHNIQUE: Multidetector CT imaging of the maxillofacial structures was performed with intravenous contrast. Multiplanar CT image reconstructions were also generated. A small metallic BB was placed on the right temple in order to reliably differentiate right from left.  CONTRAST:  72mL OMNIPAQUE IOHEXOL 300 MG/ML  SOLN  COMPARISON:  None.  FINDINGS: There is a focal area of subcutaneous soft tissue swelling/ edema involving the right facial area overlying the right mandible and maxilla. No discrete drainable soft tissue abscess.  There is extensive dental disease with dental caries and right-sided periapical lucency involving the mandibular molars. No abnormality in the floor of the mouth. The tongue base is normal.  The parotid and submandibular glands are normal. The epiglottis is normal. The paraglottic fat planes are maintained. The piriform sinus and vallecular air spaces are normal.  Small scattered bilateral neck nodes but no mass or adenopathy.  The visualized portion of the brain is unremarkable. The paranasal sinuses and mastoid air cells are clear. Globes are intact.  IMPRESSION: Right facial cellulitis without discrete abscess. This is due to periapical abscess involving the right mandibular molar.   Electronically Signed   By: Kalman Jewels M.D.   On: 12/18/2013 18:03    Myanna Ziesmer Kristeen Mans, MD  [facility] Pager:[phone]  If 7PM-7AM, please contact night-coverage [email or web address] Password [email or web address] 12/21/2013, 9:49 AM   LOS: 3 days

## 2013-12-22 ENCOUNTER — Inpatient Hospital Stay (HOSPITAL_COMMUNITY): Payer: Medicaid Other

## 2013-12-22 LAB — BASIC METABOLIC PANEL
BUN: <3 mg/dL — ABNORMAL LOW (ref 6–23)
CO2: 26 mEq/L (ref 19–32)
Calcium: 8.3 mg/dL — ABNORMAL LOW (ref 8.4–10.5)
Chloride: 100 mEq/L (ref 96–112)
Creatinine, Ser: 0.43 mg/dL — ABNORMAL LOW (ref 0.50–1.10)
GFR calc Af Amer: >90 mL/min (ref >90)
GFR calc non Af Amer: >90 mL/min (ref >90)
Glucose, Bld: 100 mg/dL — ABNORMAL HIGH (ref 70–99)
Potassium: 3.3 mEq/L — ABNORMAL LOW (ref 3.7–5.3)
Sodium: 139 mEq/L (ref 137–147)

## 2013-12-22 LAB — CBC
HCT: 23.7 % — ABNORMAL LOW (ref 36.0–46.0)
Hemoglobin: 7.8 g/dL — ABNORMAL LOW (ref 12.0–15.0)
MCH: 33.6 pg (ref 26.0–34.0)
MCHC: 32.9 g/dL (ref 30.0–36.0)
MCV: 102.2 fL — ABNORMAL HIGH (ref 78.0–100.0)
Platelets: 309 10*3/uL (ref 150–400)
RBC: 2.32 MIL/uL — ABNORMAL LOW (ref 3.87–5.11)
RDW: 15.4 % (ref 11.5–15.5)
WBC: 16.7 10*3/uL — ABNORMAL HIGH (ref 4.0–10.5)

## 2013-12-22 MED ORDER — GABAPENTIN 400 MG PO CAPS
400.0000 mg | ORAL_CAPSULE | Freq: Three times a day (TID) | ORAL | Status: DC
  Administered 2013-12-22 – 2013-12-27 (×15): 400 mg via ORAL
  Filled 2013-12-22 (×18): qty 1

## 2013-12-22 MED ORDER — POTASSIUM CHLORIDE CRYS ER 20 MEQ PO TBCR
20.0000 meq | EXTENDED_RELEASE_TABLET | Freq: Every day | ORAL | Status: DC
  Administered 2013-12-22 – 2013-12-27 (×5): 20 meq via ORAL
  Filled 2013-12-22 (×6): qty 1

## 2013-12-22 MED ORDER — GADOBENATE DIMEGLUMINE 529 MG/ML IV SOLN
10.0000 mL | Freq: Once | INTRAVENOUS | Status: AC | PRN
  Administered 2013-12-22: 10 mL via INTRAVENOUS

## 2013-12-22 MED ORDER — VANCOMYCIN HCL IN DEXTROSE 750-5 MG/150ML-% IV SOLN
750.0000 mg | Freq: Two times a day (BID) | INTRAVENOUS | Status: DC
Start: 2013-12-22 — End: 2013-12-23
  Administered 2013-12-22 – 2013-12-23 (×2): 750 mg via INTRAVENOUS
  Filled 2013-12-22 (×3): qty 150

## 2013-12-22 MED ORDER — FUROSEMIDE 40 MG PO TABS
40.0000 mg | ORAL_TABLET | Freq: Every day | ORAL | Status: DC
  Administered 2013-12-22 – 2013-12-26 (×4): 40 mg via ORAL
  Filled 2013-12-22 (×5): qty 1

## 2013-12-22 MED ORDER — MORPHINE SULFATE 2 MG/ML IJ SOLN
1.0000 mg | INTRAMUSCULAR | Status: DC | PRN
  Administered 2013-12-22 – 2013-12-25 (×20): 1 mg via INTRAVENOUS
  Filled 2013-12-22 (×22): qty 1

## 2013-12-22 MED ORDER — HEPARIN SODIUM (PORCINE) 5000 UNIT/ML IJ SOLN
5000.0000 [IU] | Freq: Three times a day (TID) | INTRAMUSCULAR | Status: AC
  Administered 2013-12-22: 5000 [IU] via SUBCUTANEOUS
  Filled 2013-12-22 (×2): qty 1

## 2013-12-22 MED ORDER — HYDRALAZINE HCL 20 MG/ML IJ SOLN
10.0000 mg | Freq: Once | INTRAMUSCULAR | Status: AC
  Administered 2013-12-22: 10 mg via INTRAVENOUS
  Filled 2013-12-22: qty 1

## 2013-12-22 MED ORDER — HEPARIN SODIUM (PORCINE) 5000 UNIT/ML IJ SOLN
5000.0000 [IU] | Freq: Three times a day (TID) | INTRAMUSCULAR | Status: DC
  Filled 2013-12-22: qty 1

## 2013-12-22 NOTE — Progress Notes (Addendum)
ANTIBIOTIC CONSULT NOTE - INITIAL  Pharmacy Consult for Vancomycin Indication: facial cellulitis  No Known Allergies  Patient Measurements: Height: 5\' 5"  (165.1 cm) Weight: 127 lb 4.8 oz (57.743 kg) IBW/kg (Calculated) : 57  Ht: 65 in   Wt 123 lb (stated)  Vital Signs: Temp: 97.4 F (36.3 C) (04/12 0416) Temp src: Oral (04/12 0416) BP: 150/95 mmHg (04/12 1209) Pulse Rate: 87 (04/12 0416) Intake/Output from previous day: 04/11 0701 - 04/12 0700 In: 1580 [P.O.:480; IV Piggyback:1100] Out: -  Intake/Output from this shift: Total I/O In: 240 [P.O.:240] Out: -   Labs:  Recent Labs  12/20/13 0653 12/21/13 0750 12/22/13 0650  WBC 15.1* 13.7* 16.7*  HGB 8.5* 8.1* 7.8*  PLT 309 304 309  CREATININE 0.54 0.45* 0.43*   Medical History: History reviewed. No pertinent past medical history.  Medications:  See electronic med rec  Assessment: 43 y.o. presents with R facial cellulitis and b/l foot pain. To begin Vancomycin for abscess. Estimated CrCl 80 ml/min. WBC continue to be elevated at 16.7.  Pt is afebrile.  Blood cx x2 ngtd.  Today is day #5 Vanc, day #4 Unasyn.    Goal of Therapy:  Vancomycin trough level 10-15 mcg/ml  Plan:  - Continue Vancomycin 750mg  IV q12h. - Continue Unasyn 3 g IV q6h - Vanc trough as inidicated - F/u transition to oral abx  Hughes Better, PharmD, BCPS Clinical Pharmacist Pager: [phone] 12/22/2013 2:47 PM

## 2013-12-22 NOTE — Progress Notes (Signed)
PATIENT DETAILS Name: [name] Age: 43 y.o. Sex: female Date of Birth: 05/29/1971 Admit Date: 12/18/2013 Admitting Physician Charlynne Cousins, MD PCP:No PCP Per Patient  Subjective: Left facial swelling has decreased.  Assessment/Plan: Active Problems:   Facial cellulitis- Right - Admitted, and started on vancomycin on 4/8, since foci is likely dental caries, added Unasyn for anaerobic coverage on 4/9-continue. Clinically much improved, leukocytoosis is improving, and patient is afebrile. Significantly decreased in the right sided swelling and erythema, now has to small indurated areas in the right upper lip, that is intermittently draining purulent material. Curbsided ENT-Dr Janace Hoard on 4/11, who quickly eyeballed the patient, and suggested that we c/w Abx, he did not think a I&D was warranted at this time.CT of the facial region done on admission was negative for abscess - Suspect will need to see a dentist to have some of her culprit tooth removed.   Systemic inflammatory response syndrome - Secondary to above, treated with antibiotics.  Bilateral lower extremity neuropathy - Neurology consulted, workup in progress.Etiology unknown, MRI of the cervical/thoracic spine negative for acute abnormalities, ANA negative.  RPR nonreactive, HIV nonreactive, ESR only at 5, rheumatoid factor negative. Vitamin B12 at 381. -Heavy metal screen, SPEP pending - Increased Neurontin to $RemoveBefo'400mg'seGlpYNifsf$  TID and started Cymbalta, will need to minimize/taper narcotics slowly - PT evaluation done-CIR recommended-consultation placed. -Neuro now recommending Muscle/Nerve Bx-have consulted [facility]  Hyponatremia - Likely secondary to dehydration, resolved with IV fluids.  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 Claims to drink on weekends and one or twice in the weekdays  Anemia -Anemia panel consistent with Anemia of Chronic disease,likely  worsened by acute illness -No indication for transfusion at this time  B/L lower extremity swelling -?secondary to hypoalbuminemia-has hx of ETOH use-but LFT's not that deranged. UA negative for Proteinuria, will check Liver Ultrasound and Echo. Will also get a doppler ultrasound of lower legs. Could be from the neuropathy as well. -start lasix, with K.  Disposition: Remain inpatient  DVT Prophylaxis: Prophylactic Heparin  Code Status: Full code   Family Communication Mother at bedside  Procedures:  None  CONSULTS:  neurology  MEDICATIONS: Scheduled Meds: . ampicillin-sulbactam (UNASYN) IV  3 g Intravenous Q6H  . DULoxetine  30 mg Oral Daily  . folic acid  1 mg Oral Daily  . furosemide  40 mg Oral Daily  . gabapentin  400 mg Oral TID  . heparin  5,000 Units Subcutaneous 3 times per day  . multivitamin with minerals  1 tablet Oral Daily  . polyethylene glycol  17 g Oral Daily  . potassium chloride  20 mEq Oral Daily  . thiamine  100 mg Oral Daily   Or  . thiamine  100 mg Intravenous Daily  . vancomycin  750 mg Intravenous Q12H  . vitamin B-12  1,000 mcg Oral Daily   Continuous Infusions:   PRN Meds:.acetaminophen, acetaminophen, HYDROcodone-acetaminophen, LORazepam, LORazepam, morphine injection, ondansetron (ZOFRAN) IV, ondansetron  Antibiotics: Anti-infectives   Start     Dose/Rate Route Frequency Ordered Stop   12/19/13 0930  Ampicillin-Sulbactam (UNASYN) 3 g in sodium chloride 0.9 % 100 mL IVPB     3 g 100 mL/hr over 60 Minutes Intravenous Every 6 hours 12/19/13 0826     12/19/13 0600  vancomycin (VANCOCIN) IVPB 750 mg/150 ml premix     750 mg 150 mL/hr over 60 Minutes Intravenous Every  12 hours 12/18/13 1808     12/18/13 1815  vancomycin (VANCOCIN) IVPB 1000 mg/200 mL premix     1,000 mg 200 mL/hr over 60 Minutes Intravenous NOW 12/18/13 1807 12/18/13 2006   12/18/13 1545  clindamycin (CLEOCIN) IVPB 600 mg     600 mg 100 mL/hr over 30 Minutes  Intravenous  Once 12/18/13 1539 12/18/13 1632       PHYSICAL EXAM: Vital signs in last 24 hours: Filed Vitals:   12/21/13 2113 12/22/13 0013 12/22/13 0024 12/22/13 0416  BP: 170/103 193/118 165/100 164/101  Pulse: 90 84  87  Temp: 98.2 F (36.8 C) 97.4 F (36.3 C)  97.4 F (36.3 C)  TempSrc: Oral Oral  Oral  Resp: $Remo'18 18  18  'eXZAx$ Height:      Weight:      SpO2: 94% 94%  93%    Weight change:  Filed Weights   12/18/13 2016  Weight: 57.743 kg (127 lb 4.8 oz)   Body mass index is 21.18 kg/(m^2).   Gen Exam: Awake and alert with clear speech. Right facial swelling significantly decreased, 2 small indurated areas in the right upper lip-not draining this am, +dental carries Neck: Supple, No JVD.   Chest: B/L Clear.   CVS: S1 S2 Regular, no murmurs.  Abdomen: soft, BS +, non tender, non distended.  Extremities: + edema, lower extremities warm to touch. Neurologic: Non Focal.   Skin: No Rash.  Wounds: N/A.    Intake/Output from previous day:  Intake/Output Summary (Last 24 hours) at 12/22/13 0945 Last data filed at 12/22/13 0601  Gross per 24 hour  Intake   1580 ml  Output      0 ml  Net   1580 ml     LAB RESULTS: CBC  Recent Labs Lab 12/18/13 1540 12/18/13 2331 12/19/13 0530 12/20/13 0653 12/21/13 0750 12/22/13 0650  WBC 24.2* 22.2* 19.3* 15.1* 13.7* 16.7*  HGB 10.2* 8.8* 8.3* 8.5* 8.1* 7.8*  HCT 28.7* 25.8* 24.1* 24.9* 24.1* 23.7*  PLT 397 366 336 309 304 309  MCV 98.6 98.9 98.4 100.8* 101.3* 102.2*  MCH 35.1* 33.7 33.9 34.4* 34.0 33.6  MCHC 35.5 34.1 34.4 34.1 33.6 32.9  RDW 15.4 15.3 15.2 15.6* 15.6* 15.4  LYMPHSABS 2.8  --   --   --   --   --   MONOABS 2.1*  --   --   --   --   --   EOSABS 0.1  --   --   --   --   --   BASOSABS 0.1  --   --   --   --   --     Chemistries   Recent Labs Lab 12/18/13 1540 12/18/13 2331 12/19/13 0530 12/20/13 0653 12/20/13 1238 12/21/13 0750 12/22/13 0650  NA 129*  --  133* 136*  --  135* 139  K 3.7  --  3.9  3.2*  --  3.2* 3.3*  CL 86*  --  92* 95*  --  95* 100  CO2 24  --  21 26  --  27 26  GLUCOSE 104*  --  90 93  --  89 100*  BUN 9  --  8 5*  --  3* <3*  CREATININE 0.67 0.59 0.59 0.54  --  0.45* 0.43*  CALCIUM 8.4  --  7.8* 7.9*  --  8.1* 8.3*  MG  --   --   --   --  1.6  --   --  CBG: No results found for this basename: GLUCAP,  in the last 168 hours  GFR Estimated Creatinine Clearance: 82.4 ml/min (by C-G formula based on Cr of 0.43).  Coagulation profile  Recent Labs Lab 12/19/13 0530  INR 1.06    Cardiac Enzymes No results found for this basename: CK, CKMB, TROPONINI, MYOGLOBIN,  in the last 168 hours  No components found with this basename: POCBNP,  No results found for this basename: DDIMER,  in the last 72 hours  Recent Labs  12/20/13 1238  HGBA1C 4.8   No results found for this basename: CHOL, HDL, LDLCALC, TRIG, CHOLHDL, LDLDIRECT,  in the last 72 hours  Recent Labs  12/20/13 1238  TSH 3.570    Recent Labs  12/20/13 1238  VITAMINB12 394  FOLATE >20.0  FERRITIN 656*  TIBC 155*  IRON 79  RETICCTPCT 6.2*   No results found for this basename: LIPASE, AMYLASE,  in the last 72 hours  Urine Studies No results found for this basename: UACOL, UAPR, USPG, UPH, UTP, UGL, UKET, UBIL, UHGB, UNIT, UROB, ULEU, UEPI, UWBC, URBC, UBAC, CAST, CRYS, UCOM, BILUA,  in the last 72 hours  MICROBIOLOGY: Recent Results (from the past 240 hour(s))  CULTURE, BLOOD (ROUTINE X 2)     Status: None   Collection Time    12/18/13  7:48 PM      Result Value Ref Range Status   Specimen Description BLOOD ARM LEFT   Final   Special Requests BOTTLES DRAWN AEROBIC AND ANAEROBIC 10CC   Final   Culture  Setup Time     Final   Value: 12/19/2013 00:57     Performed at [facility]   Culture     Final   Value:        BLOOD CULTURE RECEIVED NO GROWTH TO DATE CULTURE WILL BE HELD FOR 5 DAYS BEFORE ISSUING A FINAL NEGATIVE REPORT     Performed at [facility]    Report Status PENDING   Incomplete  CULTURE, BLOOD (ROUTINE X 2)     Status: None   Collection Time    12/18/13  7:53 PM      Result Value Ref Range Status   Specimen Description BLOOD HAND LEFT   Final   Special Requests BOTTLES DRAWN AEROBIC ONLY 10CC   Final   Culture  Setup Time     Final   Value: 12/19/2013 00:58     Performed at [facility]   Culture     Final   Value:        BLOOD CULTURE RECEIVED NO GROWTH TO DATE CULTURE WILL BE HELD FOR 5 DAYS BEFORE ISSUING A FINAL NEGATIVE REPORT     Performed at [facility]   Report Status PENDING   Incomplete    RADIOLOGY STUDIES/RESULTS: Dg Orthopantogram  12/19/2013   CLINICAL DATA:  Facial cellulitis  EXAM: ORTHOPANTOGRAM/PANORAMIC  COMPARISON:  CT 12/18/2013  FINDINGS: Multiple caries especially right lower molars and left upper third molar.  Periapical lucency around two right lower molars consistent with infection. This is noted on the CT.  Negative for fracture or mass.  IMPRESSION: Dental caries.  Periapical lucency around two right lower molars consistent with infection.   Electronically Signed   By: Marlan Palau M.D.   On: 12/19/2013 09:09   Dg Chest 2 View  11/19/2013   CLINICAL DATA Chest pain.  EXAM CHEST  2 VIEW  COMPARISON None.  FINDINGS No cardiomegaly. Unremarkable mediastinal  contours. No infiltrate, edema, effusion, or pneumothorax. There is an apparent 22 mm long lucency in the posterior right fifth rib. This could be related to a true expansile bone lesion or summation of lung markings.  IMPRESSION 1. No active cardiopulmonary disease. 2. Questionable expansile bone lesion in the posterior right fifth rib. Recommend dedicated rib series for confirmation (would like to exclude CT radiation due to young age).  SIGNATURE  Electronically Signed   By: Jorje Guild M.D.   On: 11/19/2013 13:27   Dg Ribs Unilateral Right  11/19/2013   CLINICAL DATA Lower rib pain, fall 3 weeks ago  EXAM RIGHT RIBS - 2 VIEW   COMPARISON 11/19/2013  FINDINGS Radiopaque marker over the right lower ribs. No displaced fracture or definite focal rib abnormality. Inferior right ribs appear intact. Right lung clear. No effusion or pneumothorax. No chest wall asymmetry or subcutaneous emphysema.  IMPRESSION No acute osseous finding  SIGNATURE  Electronically Signed   By: Daryll Brod M.D.   On: 11/19/2013 16:57   Ct Maxillofacial W/cm  12/18/2013   CLINICAL DATA:  Right-sided facial pain and swelling.  EXAM: CT MAXILLOFACIAL WITH CONTRAST  TECHNIQUE: Multidetector CT imaging of the maxillofacial structures was performed with intravenous contrast. Multiplanar CT image reconstructions were also generated. A small metallic BB was placed on the right temple in order to reliably differentiate right from left.  CONTRAST:  43mL OMNIPAQUE IOHEXOL 300 MG/ML  SOLN  COMPARISON:  None.  FINDINGS: There is a focal area of subcutaneous soft tissue swelling/ edema involving the right facial area overlying the right mandible and maxilla. No discrete drainable soft tissue abscess.  There is extensive dental disease with dental caries and right-sided periapical lucency involving the mandibular molars. No abnormality in the floor of the mouth. The tongue base is normal.  The parotid and submandibular glands are normal. The epiglottis is normal. The paraglottic fat planes are maintained. The piriform sinus and vallecular air spaces are normal.  Small scattered bilateral neck nodes but no mass or adenopathy.  The visualized portion of the brain is unremarkable. The paranasal sinuses and mastoid air cells are clear. Globes are intact.  IMPRESSION: Right facial cellulitis without discrete abscess. This is due to periapical abscess involving the right mandibular molar.   Electronically Signed   By: Kalman Jewels M.D.   On: 12/18/2013 18:03    Shanker Kristeen Mans, MD  [facility] Pager:[phone]  If 7PM-7AM, please contact  night-coverage [email or web address] Password [email or web address] 12/22/2013, 9:45 AM   LOS: 4 days

## 2013-12-23 ENCOUNTER — Encounter (HOSPITAL_COMMUNITY): Payer: Self-pay | Admitting: Dentistry

## 2013-12-23 ENCOUNTER — Inpatient Hospital Stay (HOSPITAL_COMMUNITY): Payer: Medicaid Other

## 2013-12-23 ENCOUNTER — Encounter (HOSPITAL_COMMUNITY)
Admission: EM | Disposition: A | Discharge status: Skilled Nursing Facility | Payer: Self-pay | Source: Home / Self Care | Attending: Internal Medicine

## 2013-12-23 DIAGNOSIS — R221 Localized swelling, mass and lump, neck: Secondary | ICD-10-CM

## 2013-12-23 DIAGNOSIS — M7989 Other specified soft tissue disorders: Secondary | ICD-10-CM

## 2013-12-23 DIAGNOSIS — K0401 Reversible pulpitis: Secondary | ICD-10-CM

## 2013-12-23 DIAGNOSIS — R220 Localized swelling, mass and lump, head: Secondary | ICD-10-CM

## 2013-12-23 DIAGNOSIS — K045 Chronic apical periodontitis: Secondary | ICD-10-CM

## 2013-12-23 DIAGNOSIS — I369 Nonrheumatic tricuspid valve disorder, unspecified: Secondary | ICD-10-CM

## 2013-12-23 LAB — COMPREHENSIVE METABOLIC PANEL
ALT: 13 U/L (ref 0–35)
AST: 80 U/L — ABNORMAL HIGH (ref 0–37)
Albumin: 2.7 g/dL — ABNORMAL LOW (ref 3.5–5.2)
Alkaline Phosphatase: 197 U/L — ABNORMAL HIGH (ref 39–117)
BUN: <3 mg/dL — ABNORMAL LOW (ref 6–23)
CO2: 25 mEq/L (ref 19–32)
Calcium: 8.4 mg/dL (ref 8.4–10.5)
Chloride: 97 mEq/L (ref 96–112)
Creatinine, Ser: 0.47 mg/dL — ABNORMAL LOW (ref 0.50–1.10)
GFR calc Af Amer: >90 mL/min (ref >90)
GFR calc non Af Amer: >90 mL/min (ref >90)
Glucose, Bld: 93 mg/dL (ref 70–99)
Potassium: 3.5 mEq/L — ABNORMAL LOW (ref 3.7–5.3)
Sodium: 139 mEq/L (ref 137–147)
Total Bilirubin: 0.4 mg/dL (ref 0.3–1.2)
Total Protein: 6.2 g/dL (ref 6.0–8.3)

## 2013-12-23 LAB — CBC
HCT: 23.9 % — ABNORMAL LOW (ref 36.0–46.0)
Hemoglobin: 8 g/dL — ABNORMAL LOW (ref 12.0–15.0)
MCH: 34.2 pg — ABNORMAL HIGH (ref 26.0–34.0)
MCHC: 33.5 g/dL (ref 30.0–36.0)
MCV: 102.1 fL — ABNORMAL HIGH (ref 78.0–100.0)
Platelets: 333 10*3/uL (ref 150–400)
RBC: 2.34 MIL/uL — ABNORMAL LOW (ref 3.87–5.11)
RDW: 15.8 % — ABNORMAL HIGH (ref 11.5–15.5)
WBC: 19.5 10*3/uL — ABNORMAL HIGH (ref 4.0–10.5)

## 2013-12-23 LAB — PRO B NATRIURETIC PEPTIDE: Pro B Natriuretic peptide (BNP): 4017 pg/mL — ABNORMAL HIGH (ref 0–125)

## 2013-12-23 SURGERY — MUSCLE BIOPSY
Anesthesia: General

## 2013-12-23 MED ORDER — HYDRALAZINE HCL 20 MG/ML IJ SOLN
10.0000 mg | Freq: Once | INTRAMUSCULAR | Status: AC
  Administered 2013-12-23: 10 mg via INTRAVENOUS
  Filled 2013-12-23: qty 1

## 2013-12-23 MED ORDER — CARVEDILOL 6.25 MG PO TABS
6.2500 mg | ORAL_TABLET | Freq: Two times a day (BID) | ORAL | Status: DC
  Administered 2013-12-23: 6.25 mg via ORAL
  Filled 2013-12-23 (×4): qty 1

## 2013-12-23 MED ORDER — MORPHINE SULFATE 2 MG/ML IJ SOLN
2.0000 mg | Freq: Once | INTRAMUSCULAR | Status: AC
  Administered 2013-12-23: 2 mg via INTRAVENOUS

## 2013-12-23 MED ORDER — OXYMETAZOLINE HCL 0.05 % NA SOLN
2.0000 | Freq: Two times a day (BID) | NASAL | Status: DC | PRN
  Filled 2013-12-23: qty 15

## 2013-12-23 MED ORDER — BIOTENE DRY MOUTH MT LIQD
15.0000 mL | Freq: Two times a day (BID) | OROMUCOSAL | Status: DC
  Administered 2013-12-23 – 2013-12-24 (×2): 15 mL via OROMUCOSAL

## 2013-12-23 MED ORDER — VANCOMYCIN HCL IN DEXTROSE 750-5 MG/150ML-% IV SOLN
750.0000 mg | Freq: Two times a day (BID) | INTRAVENOUS | Status: DC
  Administered 2013-12-23 – 2013-12-26 (×7): 750 mg via INTRAVENOUS
  Filled 2013-12-23 (×7): qty 150

## 2013-12-23 NOTE — Progress Notes (Signed)
PATIENT DETAILS Name: [name] Age: 43 y.o. Sex: female Date of Birth: 10-13-1970 Admit Date: 12/18/2013 Admitting Physician Charlynne Cousins, MD PCP:No PCP Per Patient  Subjective: Left facial swelling continues to decrease  Assessment/Plan: Active Problems:   Facial cellulitis- Right - Admitted, and started on vancomycin on 4/8, since foci is likely dental caries, added Unasyn for anaerobic coverage on 4/9-continue. Clinically much improved,  and patient is afebrile. Significantly decreased in the right sided swelling and erythema, now has to small indurated areas in the right upper lip, that is intermittently draining purulent material. Curbsided ENT-Dr Janace Hoard on 4/11, who quickly eyeballed the patient, and suggested that we c/w Abx, he did not think a I&D was warranted at this time.CT of the facial region done on admission was negative for abscess. Although clinically improved, continues to have leukocytosis. Will stop Vanco on 4/13, c/w Unasyn - Have consulted Dentistry to evaluate her teeth.   Systemic inflammatory response syndrome - Secondary to above, treated with antibiotics.  Bilateral lower extremity neuropathy - Neurology consulted, workup in progress.Etiology unknown, MRI of the cervical/thoracic spine negative for acute abnormalities, ANA negative.  RPR nonreactive, HIV nonreactive, ESR only at 5, rheumatoid factor negative. Vitamin B12 at 381. -Heavy metal screen, SPEP pending - Increased Neurontin to $RemoveBefo'400mg'qRLwYLwFbcd$  TID and started Cymbalta, will need to minimize/taper narcotics slowly - PT evaluation done-CIR recommended-recommended HHPT -Neuro now recommending Muscle/Nerve Bx-have consulted [facility], and LP as well.  Hyponatremia - Likely secondary to dehydration, resolved with IV fluids.  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 Claims to drink on weekends and one or twice in the weekdays  Anemia -Anemia  panel consistent with Anemia of Chronic disease,likely worsened by acute illness -No indication for transfusion at this time -continue to monitor Hb periodically  B/L lower extremity swelling -?secondary to hypoalbuminemia-has hx of ETOH use-but LFT's not that deranged. UA negative for Proteinuria, will check Liver Ultrasound and Echo. Will also get a doppler ultrasound of lower legs. Could be from the neuropathy as well. -start lasix, with K.  Disposition: Remain inpatient  DVT Prophylaxis: Prophylactic Heparin  Code Status: Full code   Family Communication Mother at bedside  Procedures:  None  CONSULTS:  neurology  MEDICATIONS: Scheduled Meds: . ampicillin-sulbactam (UNASYN) IV  3 g Intravenous Q6H  . antiseptic oral rinse  15 mL Mouth Rinse BID  . DULoxetine  30 mg Oral Daily  . folic acid  1 mg Oral Daily  . furosemide  40 mg Oral Daily  . gabapentin  400 mg Oral TID  . multivitamin with minerals  1 tablet Oral Daily  . polyethylene glycol  17 g Oral Daily  . potassium chloride  20 mEq Oral Daily  . thiamine  100 mg Oral Daily   Or  . thiamine  100 mg Intravenous Daily  . vitamin B-12  1,000 mcg Oral Daily   Continuous Infusions:   PRN Meds:.acetaminophen, acetaminophen, HYDROcodone-acetaminophen, morphine injection, ondansetron (ZOFRAN) IV, ondansetron, oxymetazoline  Antibiotics: Anti-infectives   Start     Dose/Rate Route Frequency Ordered Stop   12/22/13 1930  vancomycin (VANCOCIN) IVPB 750 mg/150 ml premix  Status:  Discontinued     750 mg 150 mL/hr over 60 Minutes Intravenous Every 12 hours 12/22/13 1856 12/23/13 0909   12/19/13 0930  Ampicillin-Sulbactam (UNASYN) 3 g in sodium chloride 0.9 % 100 mL IVPB  3 g 100 mL/hr over 60 Minutes Intravenous Every 6 hours 12/19/13 0826     12/19/13 0600  vancomycin (VANCOCIN) IVPB 750 mg/150 ml premix  Status:  Discontinued     750 mg 150 mL/hr over 60 Minutes Intravenous Every 12 hours 12/18/13 1808  12/22/13 1856   12/18/13 1815  vancomycin (VANCOCIN) IVPB 1000 mg/200 mL premix     1,000 mg 200 mL/hr over 60 Minutes Intravenous NOW 12/18/13 1807 12/18/13 2006   12/18/13 1545  clindamycin (CLEOCIN) IVPB 600 mg     600 mg 100 mL/hr over 30 Minutes Intravenous  Once 12/18/13 1539 12/18/13 1632       PHYSICAL EXAM: Vital signs in last 24 hours: Filed Vitals:   12/23/13 0100 12/23/13 0207 12/23/13 0322 12/23/13 0452  BP: 178/102 178/114 160/97 164/102  Pulse: 98 103 98 106  Temp:    98.3 F (36.8 C)  TempSrc:    Oral  Resp:    18  Height:      Weight:    58.922 kg (129 lb 14.4 oz)  SpO2:    92%    Weight change:  Filed Weights   12/18/13 2016 12/23/13 0452  Weight: 57.743 kg (127 lb 4.8 oz) 58.922 kg (129 lb 14.4 oz)   Body mass index is 21.62 kg/(m^2).   Gen Exam: Awake and alert with clear speech. Right facial swelling significantly decreased, 2 small indurated areas in the right upper lip-not draining this am, +dental carries Neck: Supple, No JVD.   Chest: B/L Clear.   CVS: S1 S2 Regular, no murmurs.  Abdomen: soft, BS +, non tender, non distended.  Extremities: + edema, lower extremities warm to touch. Neurologic: Non Focal.   Skin: No Rash.  Wounds: N/A.    Intake/Output from previous day:  Intake/Output Summary (Last 24 hours) at 12/23/13 1034 Last data filed at 12/23/13 0516  Gross per 24 hour  Intake    972 ml  Output      0 ml  Net    972 ml     LAB RESULTS: CBC  Recent Labs Lab 12/18/13 1540  12/19/13 0530 12/20/13 0653 12/21/13 0750 12/22/13 0650 12/23/13 0715  WBC 24.2*  < > 19.3* 15.1* 13.7* 16.7* 19.5*  HGB 10.2*  < > 8.3* 8.5* 8.1* 7.8* 8.0*  HCT 28.7*  < > 24.1* 24.9* 24.1* 23.7* 23.9*  PLT 397  < > 336 309 304 309 333  MCV 98.6  < > 98.4 100.8* 101.3* 102.2* 102.1*  MCH 35.1*  < > 33.9 34.4* 34.0 33.6 34.2*  MCHC 35.5  < > 34.4 34.1 33.6 32.9 33.5  RDW 15.4  < > 15.2 15.6* 15.6* 15.4 15.8*  LYMPHSABS 2.8  --   --   --   --   --    --   MONOABS 2.1*  --   --   --   --   --   --   EOSABS 0.1  --   --   --   --   --   --   BASOSABS 0.1  --   --   --   --   --   --   < > = values in this interval not displayed.  Chemistries   Recent Labs Lab 12/19/13 0530 12/20/13 0653 12/20/13 1238 12/21/13 0750 12/22/13 0650 12/23/13 0715  NA 133* 136*  --  135* 139 139  K 3.9 3.2*  --  3.2* 3.3* 3.5*  CL 92*  95*  --  95* 100 97  CO2 21 26  --  $R'27 26 25  'us$ GLUCOSE 90 93  --  89 100* 93  BUN 8 5*  --  3* <3* <3*  CREATININE 0.59 0.54  --  0.45* 0.43* 0.47*  CALCIUM 7.8* 7.9*  --  8.1* 8.3* 8.4  MG  --   --  1.6  --   --   --     CBG: No results found for this basename: GLUCAP,  in the last 168 hours  GFR Estimated Creatinine Clearance: 82.4 ml/min (by C-G formula based on Cr of 0.47).  Coagulation profile  Recent Labs Lab 12/19/13 0530  INR 1.06    Cardiac Enzymes No results found for this basename: CK, CKMB, TROPONINI, MYOGLOBIN,  in the last 168 hours  No components found with this basename: POCBNP,  No results found for this basename: DDIMER,  in the last 72 hours  Recent Labs  12/20/13 1238  HGBA1C 4.8   No results found for this basename: CHOL, HDL, LDLCALC, TRIG, CHOLHDL, LDLDIRECT,  in the last 72 hours  Recent Labs  12/20/13 1238  TSH 3.570    Recent Labs  12/20/13 1238  VITAMINB12 394  FOLATE >20.0  FERRITIN 656*  TIBC 155*  IRON 79  RETICCTPCT 6.2*   No results found for this basename: LIPASE, AMYLASE,  in the last 72 hours  Urine Studies No results found for this basename: UACOL, UAPR, USPG, UPH, UTP, UGL, UKET, UBIL, UHGB, UNIT, UROB, ULEU, UEPI, UWBC, URBC, UBAC, CAST, CRYS, UCOM, BILUA,  in the last 72 hours  MICROBIOLOGY: Recent Results (from the past 240 hour(s))  CULTURE, BLOOD (ROUTINE X 2)     Status: None   Collection Time    12/18/13  7:48 PM      Result Value Ref Range Status   Specimen Description BLOOD ARM LEFT   Final   Special Requests BOTTLES DRAWN  AEROBIC AND ANAEROBIC 10CC   Final   Culture  Setup Time     Final   Value: 12/19/2013 00:57     Performed at [facility]   Culture     Final   Value:        BLOOD CULTURE RECEIVED NO GROWTH TO DATE CULTURE WILL BE HELD FOR 5 DAYS BEFORE ISSUING A FINAL NEGATIVE REPORT     Performed at [facility]   Report Status PENDING   Incomplete  CULTURE, BLOOD (ROUTINE X 2)     Status: None   Collection Time    12/18/13  7:53 PM      Result Value Ref Range Status   Specimen Description BLOOD HAND LEFT   Final   Special Requests BOTTLES DRAWN AEROBIC ONLY 10CC   Final   Culture  Setup Time     Final   Value: 12/19/2013 00:58     Performed at [facility]   Culture     Final   Value:        BLOOD CULTURE RECEIVED NO GROWTH TO DATE CULTURE WILL BE HELD FOR 5 DAYS BEFORE ISSUING A FINAL NEGATIVE REPORT     Performed at [facility]   Report Status PENDING   Incomplete    RADIOLOGY STUDIES/RESULTS: Dg Orthopantogram  12/19/2013   CLINICAL DATA:  Facial cellulitis  EXAM: ORTHOPANTOGRAM/PANORAMIC  COMPARISON:  CT 12/18/2013  FINDINGS: Multiple caries especially right lower molars and left upper third molar.  Periapical lucency around two  right lower molars consistent with infection. This is noted on the CT.  Negative for fracture or mass.  IMPRESSION: Dental caries.  Periapical lucency around two right lower molars consistent with infection.   Electronically Signed   By: Franchot Gallo M.D.   On: 12/19/2013 09:09   Dg Chest 2 View  11/19/2013   CLINICAL DATA Chest pain.  EXAM CHEST  2 VIEW  COMPARISON None.  FINDINGS No cardiomegaly. Unremarkable mediastinal contours. No infiltrate, edema, effusion, or pneumothorax. There is an apparent 22 mm long lucency in the posterior right fifth rib. This could be related to a true expansile bone lesion or summation of lung markings.  IMPRESSION 1. No active cardiopulmonary disease. 2. Questionable expansile bone lesion in the  posterior right fifth rib. Recommend dedicated rib series for confirmation (would like to exclude CT radiation due to young age).  SIGNATURE  Electronically Signed   By: Jorje Guild M.D.   On: 11/19/2013 13:27   Dg Ribs Unilateral Right  11/19/2013   CLINICAL DATA Lower rib pain, fall 3 weeks ago  EXAM RIGHT RIBS - 2 VIEW  COMPARISON 11/19/2013  FINDINGS Radiopaque marker over the right lower ribs. No displaced fracture or definite focal rib abnormality. Inferior right ribs appear intact. Right lung clear. No effusion or pneumothorax. No chest wall asymmetry or subcutaneous emphysema.  IMPRESSION No acute osseous finding  SIGNATURE  Electronically Signed   By: Daryll Brod M.D.   On: 11/19/2013 16:57   Ct Maxillofacial W/cm  12/18/2013   CLINICAL DATA:  Right-sided facial pain and swelling.  EXAM: CT MAXILLOFACIAL WITH CONTRAST  TECHNIQUE: Multidetector CT imaging of the maxillofacial structures was performed with intravenous contrast. Multiplanar CT image reconstructions were also generated. A small metallic BB was placed on the right temple in order to reliably differentiate right from left.  CONTRAST:  58mL OMNIPAQUE IOHEXOL 300 MG/ML  SOLN  COMPARISON:  None.  FINDINGS: There is a focal area of subcutaneous soft tissue swelling/ edema involving the right facial area overlying the right mandible and maxilla. No discrete drainable soft tissue abscess.  There is extensive dental disease with dental caries and right-sided periapical lucency involving the mandibular molars. No abnormality in the floor of the mouth. The tongue base is normal.  The parotid and submandibular glands are normal. The epiglottis is normal. The paraglottic fat planes are maintained. The piriform sinus and vallecular air spaces are normal.  Small scattered bilateral neck nodes but no mass or adenopathy.  The visualized portion of the brain is unremarkable. The paranasal sinuses and mastoid air cells are clear. Globes are intact.   IMPRESSION: Right facial cellulitis without discrete abscess. This is due to periapical abscess involving the right mandibular molar.   Electronically Signed   By: Kalman Jewels M.D.   On: 12/18/2013 18:03    Muranda Coye Kristeen Mans, MD  [facility] Pager:[phone]  If 7PM-7AM, please contact night-coverage [email or web address] Password [email or web address] 12/23/2013, 10:34 AM   LOS: 5 days

## 2013-12-23 NOTE — Consult Note (Signed)
DENTAL CONSULTATION  Date of Consultation:  12/23/2013 Patient Name:   [name] Date of Birth:   September 02, 1971 Medical Record Number: [id number]  VITALS: BP 164/102  Pulse 106  Temp(Src) 98.3 F (36.8 C) (Oral)  Resp 18  Ht 5\' 5"  (1.651 m)  Wt 129 lb 14.4 oz (58.922 kg)  BMI 21.62 kg/m2  SpO2 92%  LMP 11/03/2013   CHIEF COMPLAINT: Patient was referred for evaluation of right facial swelling.  HPI: [name] is a 43 year old female with history of right facial swelling. Patient presented the emergency room and was admitted and placed on IV antibiotic therapy. Patient has had some significant resolution of the right facial swelling. A dental consultation was requested to evaluate right facial swelling, to rule out dental etiology, and provide treatment is indicated.    The patient has a history of intermittent toothache symptoms involving the lower right quadrant and upper left quadrant. The patient describes the pain as being dull at times and sharp at times. Patient indicates that she did have some significant facial swelling that led her to seek treatment at the emergency room. She has not seen a dentist in over 10 years by her report. Patient does not have a regular primary dentist.   PROBLEM LIST: Patient Active Problem List   Diagnosis Date Noted  . Facial cellulitis 12/18/2013  . Lower extremity pain, bilateral 12/18/2013    PMH: History reviewed. No pertinent past medical history.  PSH: Past Surgical History  Procedure Laterality Date  . Tubal ligation      ALLERGIES: No Known Allergies  MEDICATIONS: Current Facility-Administered Medications  Medication Dose Route Frequency Provider Last Rate Last Dose  . acetaminophen (TYLENOL) tablet 650 mg  650 mg Oral Q6H PRN Charlynne Cousins, MD       Or  . acetaminophen (TYLENOL) suppository 650 mg  650 mg Rectal Q6H PRN Charlynne Cousins, MD      . Ampicillin-Sulbactam (UNASYN) 3 g in sodium chloride 0.9 % 100 mL  IVPB  3 g Intravenous Q6H Shanker Kristeen Mans, MD   3 g at 12/23/13 1146  . antiseptic oral rinse (BIOTENE) solution 15 mL  15 mL Mouth Rinse BID Jonetta Osgood, MD      . DULoxetine (CYMBALTA) DR capsule 30 mg  30 mg Oral Daily Jonetta Osgood, MD   30 mg at 12/23/13 1012  . folic acid (FOLVITE) tablet 1 mg  1 mg Oral Daily Jonetta Osgood, MD   1 mg at 12/23/13 1013  . furosemide (LASIX) tablet 40 mg  40 mg Oral Daily Jonetta Osgood, MD   40 mg at 12/23/13 1012  . gabapentin (NEURONTIN) capsule 400 mg  400 mg Oral TID Jonetta Osgood, MD   400 mg at 12/23/13 1012  . HYDROcodone-acetaminophen (NORCO/VICODIN) 5-325 MG per tablet 2 tablet  2 tablet Oral Q4H PRN Charlynne Cousins, MD   2 tablet at 12/23/13 1012  . morphine 2 MG/ML injection 1 mg  1 mg Intravenous Q2H PRN Jonetta Osgood, MD   1 mg at 12/23/13 1215  . multivitamin with minerals tablet 1 tablet  1 tablet Oral Daily Jonetta Osgood, MD   1 tablet at 12/23/13 1012  . ondansetron (ZOFRAN) tablet 4 mg  4 mg Oral Q6H PRN Charlynne Cousins, MD   4 mg at 12/19/13 9357   Or  . ondansetron [facility]) injection 4 mg  4 mg Intravenous Q6H PRN Charlynne Cousins, MD      .  oxymetazoline (AFRIN) 0.05 % nasal spray 2 spray  2 spray Each Nare BID PRN Rhetta Mura Schorr, NP      . polyethylene glycol (MIRALAX / GLYCOLAX) packet 17 g  17 g Oral Daily Jonetta Osgood, MD   17 g at 12/20/13 1115  . potassium chloride SA (K-DUR,KLOR-CON) CR tablet 20 mEq  20 mEq Oral Daily Jonetta Osgood, MD   20 mEq at 12/23/13 1013  . thiamine (VITAMIN B-1) tablet 100 mg  100 mg Oral Daily Jonetta Osgood, MD   100 mg at 12/23/13 1012  . vitamin B-12 (CYANOCOBALAMIN) tablet 1,000 mcg  1,000 mcg Oral Daily Jonetta Osgood, MD   1,000 mcg at 12/23/13 1012    LABS: Lab Results  Component Value Date   WBC 19.5* 12/23/2013   HGB 8.0* 12/23/2013   HCT 23.9* 12/23/2013   MCV 102.1* 12/23/2013   PLT 333 12/23/2013      Component Value Date/Time    NA 139 12/23/2013 0715   K 3.5* 12/23/2013 0715   CL 97 12/23/2013 0715   CO2 25 12/23/2013 0715   GLUCOSE 93 12/23/2013 0715   BUN <3* 12/23/2013 0715   CREATININE 0.47* 12/23/2013 0715   CALCIUM 8.4 12/23/2013 0715   GFRNONAA >90 12/23/2013 0715   GFRAA >90 12/23/2013 0715   Lab Results  Component Value Date   INR 1.06 12/19/2013   No results found for this basename: PTT    SOCIAL HISTORY: History   Social History  . Marital Status: Single    Spouse Name: N/A    Number of Children: N/A  . Years of Education: N/A   Occupational History  . Not on file.   Social History Main Topics  . Smoking status: Current Every Day Smoker  . Smokeless tobacco: Not on file  . Alcohol Use: Yes  . Drug Use: No  . Sexual Activity: No   Other Topics Concern  . Not on file   Social History Narrative  . No narrative on file    FAMILY HISTORY: Family History  Problem Relation Age of Onset  . Diabetes Father   . Diabetes Maternal Aunt   . Heart disease Maternal Aunt   . Diabetes Maternal Uncle   . Heart disease Maternal Uncle   . Drug abuse Maternal Grandmother   . Cancer Maternal Grandfather   . Hypertension Mother   . Raynaud syndrome Mother      REVIEW OF SYSTEMS: Reviewed from chart for this admission.  DENTAL HISTORY: CHIEF COMPLAINT: Patient was referred for evaluation of right facial swelling.  HPI: [name] is a 43 year old female with history of right facial swelling. Patient presented the emergency room and was admitted and placed on IV antibiotic therapy. Patient has had some significant resolution of the right facial swelling. A dental consultation was requested to evaluate right facial swelling, to rule out dental etiology, and provide treatment is indicated.    The patient has a history of intermittent toothache symptoms involving the lower right quadrant and upper left quadrant. The patient describes the pain as being dull at times and sharp at times. Patient  indicates that she did have some significant facial swelling that led her to seek treatment at the emergency room. She has not seen a dentist in over 10 years by her report. Patient does not have a regular primary dentist.  DENTAL EXAMINATION:  GENERAL: The patient is a well-developed, slightly built female in no acute distress HEAD AND NECK:  There is right submandibular lymphadenopathy. This is tender to palpation. There is no left neck lymphadenopathy. Patient does have some areas of redness and swelling and  "pimple-like" lesions involving the right cheek and right upper lip. This lesion appears to resemble a skin lesion of dermatological origin versus dental origin. INTRAORAL EXAM: The patient has normal saliva. I do not see any evidence of intraoral abscess formation. DENTITION: The patient is missing tooth numbers 1, 16, 17, 18, 19, and 32.  Tooth numbers 2, 4, 13, 15, 30, and 31 are present as retained root segments. Multiple malpositioned and rotated teeth are noted. PERIODONTAL: Patient has chronic periodontitis with plaque and calculus relations, is gingival recession, and tooth mobility. There is moderate bone loss noted. DENTAL CARIES/SUBOPTIMAL RESTORATIONS: Patient has multiple dental caries noted. ENDODONTIC: Patient with a history of intermittent toothache symptoms. Patient has multiple areas of periapical pathology and radiolucency. I do not see any evidence of intraoral abscess formation. Patient has had previous root canal therapy associated with tooth #9. CROWN AND BRIDGE: There is a crown on tooth #20. PROSTHODONTIC: Patient denies having any partial dentures. OCCLUSION: Patient has a poor occlusal scheme secondary to multiple missing teeth, multiple retained root segments, multiple rotated teeth, supra-eruption and drifting of the unopposed teeth into the edentulous areas, and lack of replacement of the missing teeth with dental prostheses.  RADIOGRAPHIC INTERPRETATION: An  orthopantogram was taken on 12/23/2013. There are multiple missing teeth. There are multiple retained root segments. There is moderate bone loss. There are multiple areas of periapical pathology and radiolucency. Dental caries are noted. There are multiple dental restorations noted. Multiple malposition teeth are noted. There is supra-eruption and drifting of the unopposed teeth into the edentulous areas.   ASSESSMENTS: 1. History of right facial swelling with dental etiology 2. Multiple skin lesions involving the right upper lip and right cheek most likely related to either bacterial or possible viral etiology. Consider infectious disease input or dermatology input as indicated. 3. Chronic apical periodontitis 4. History of acute pulpitis symptoms 5. Multiple dental caries 6. Chronic periodontitis with bone loss 7. Multiple malpositioned teeth 8. Multiple missing teeth 9. Multiple retained root segments 10. History of root canal therapy #9. 11. Supra-eruption and drifting of the unopposed teeth into the edentulous areas 12. No history of partial dentures 13. History of oral neglect 14. Malocclusion   PLAN/RECOMMENDATIONS: 1. I discussed the risks, benefits, and complications of various treatment options with the patient in relationship to her medical and dental conditions. We discussed various treatment options to include no treatment, multiple extractions with alveoloplasty, pre-prosthetic surgery as indicated, periodontal therapy, dental restorations, root canal therapy, crown and bridge therapy, implant therapy, and replacement of missing teeth as indicated. The patient currently wishes to proceed with extraction of indicated teeth with alveoloplasty as needed, but is also considering extraction of all remaining teeth with alveoloplasty and pre-prosthetic surgery as indicated in the operating room with general anesthesia.  The operating room procedure has been scheduled for Wednesday,  12/25/2013 at 8:30 AM and patient will provide decision on teeth that she wants extracted prior to that time. Patient was informed that multiple teeth could be salvaged at this time, but would require additional dental followup to restore and maintain the dental health of these teeth. Due to the economic concerns, the patient is considering extraction of all remaining teeth at this time with subsequent fabrication of upper and lower complete dentures after adequate healing.  I did discuss the presence of the  skin lesions involving the right cheek and right upper lip with Dr. Nigel Bridgeman.  He will continue vancomycin IV antibiotic therapy and consider infectious disease or dermatology input as indicated.   2. Discussion of findings with medical team and coordination of future medical and dental care as needed.   Lenn Cal, DDS

## 2013-12-23 NOTE — Progress Notes (Signed)
VASCULAR LAB PRELIMINARY  PRELIMINARY  PRELIMINARY  PRELIMINARY  Bilateral lower extremity venous duplex completed.    Preliminary report:  Bilateral:  No evidence of DVT, superficial thrombosis, or Baker's Cyst.   Elody Kleinsasser D Rickardo Brinegar, RVS 12/23/2013, 1:40 PM

## 2013-12-23 NOTE — Progress Notes (Signed)
Patients BP 182/102 manually checked. MD notified. Orders recieved

## 2013-12-23 NOTE — Progress Notes (Signed)
Patient not a candidate for an inpt rehab admission. See Dr. Charm Barges consult today.[phone]

## 2013-12-23 NOTE — Progress Notes (Addendum)
Subjective: Symptoms continue.  Patient complains of significant pain this morning.  Now has edema in both lower extremities.    Objective: Current vital signs: BP 164/102  Pulse 106  Temp(Src) 98.3 F (36.8 C) (Oral)  Resp 18  Ht 5\' 5"  (1.651 m)  Wt 58.922 kg (129 lb 14.4 oz)  BMI 21.62 kg/m2  SpO2 92%  LMP 11/03/2013 Vital signs in last 24 hours: Temp:  [98.1 F (36.7 C)-98.3 F (36.8 C)] 98.3 F (36.8 C) (04/13 0452) Pulse Rate:  [92-106] 106 (04/13 0452) Resp:  [18] 18 (04/13 0452) BP: (150-198)/(95-118) 164/102 mmHg (04/13 0452) SpO2:  [92 %-97 %] 92 % (04/13 0452) Weight:  [58.922 kg (129 lb 14.4 oz)] 58.922 kg (129 lb 14.4 oz) (04/13 0452)  Intake/Output from previous day: 04/12 0701 - 04/13 0700 In: 1212 [P.O.:462; IV Piggyback:750] Out: -  Intake/Output this shift:   Nutritional status: General  Neurologic Exam: Mental Status:  Alert, oriented, thought content appropriate. Speech fluent without evidence of aphasia. Able to follow 3 step commands without difficulty.  Cranial Nerves:  II: Visual fields grossly normal, pupils equal, round, reactive to light and accommodation  III,IV, VI: ptosis not present, extra-ocular motions intact bilaterally  V,VII: smile asymmetric on the right due to welling of face secondary to infection, facial light touch sensation normal bilaterally  VIII: hearing normal bilaterally  IX,X: gag reflex present  XI: bilateral shoulder shrug  XII: midline tongue extension without atrophy or fasciculations  Motor:  Right : Upper extremity 5/5        Left: Upper extremity 5/5   Lower extremity 4/5     Lower extremity 5/5  --distal leg strength difficult to test as patient is in significant pain. Allodynia is present in both feet up to ankle.  Tone and bulk:normal tone throughout; no atrophy noted  Sensory: continues to have decreased sensation up to T4 in the anterior trunk but when testing sensation on her back I did not get a level   Deep Tendon Reflexes:  2+ in the upper extremities and absent in lower extremities Plantars:  Right: downgoing   Left: downgoing  Cerebellar:  normal finger-to-nose, Unable to test heel-to-shin due to pain   Lab Results: Basic Metabolic Panel:  Recent Labs Lab 12/19/13 0530 12/20/13 0653 12/20/13 1238 12/21/13 0750 12/22/13 0650 12/23/13 0715  NA 133* 136*  --  135* 139 139  K 3.9 3.2*  --  3.2* 3.3* 3.5*  CL 92* 95*  --  95* 100 97  CO2 21 26  --  27 26 25   GLUCOSE 90 93  --  89 100* 93  BUN 8 5*  --  3* <3* <3*  CREATININE 0.59 0.54  --  0.45* 0.43* 0.47*  CALCIUM 7.8* 7.9*  --  8.1* 8.3* 8.4  MG  --   --  1.6  --   --   --     Liver Function Tests:  Recent Labs Lab 12/18/13 1540 12/19/13 0530 12/23/13 0715  AST 49* 44* 80*  ALT 8 5 13   ALKPHOS 183* 150* 197*  BILITOT 0.6 0.4 0.4  PROT 6.1 5.4* 6.2  ALBUMIN 2.6* 2.2* 2.7*   No results found for this basename: LIPASE, AMYLASE,  in the last 168 hours No results found for this basename: AMMONIA,  in the last 168 hours  CBC:  Recent Labs Lab 12/18/13 1540  12/19/13 0530 12/20/13 0653 12/21/13 0750 12/22/13 0650 12/23/13 0715  WBC 24.2*  < > 19.3*  15.1* 13.7* 16.7* 19.5*  NEUTROABS 19.2*  --   --   --   --   --   --   HGB 10.2*  < > 8.3* 8.5* 8.1* 7.8* 8.0*  HCT 28.7*  < > 24.1* 24.9* 24.1* 23.7* 23.9*  MCV 98.6  < > 98.4 100.8* 101.3* 102.2* 102.1*  PLT 397  < > 336 309 304 309 333  < > = values in this interval not displayed.  Cardiac Enzymes: No results found for this basename: CKTOTAL, CKMB, CKMBINDEX, TROPONINI,  in the last 168 hours  Lipid Panel: No results found for this basename: CHOL, TRIG, HDL, CHOLHDL, VLDL, LDLCALC,  in the last 168 hours  CBG: No results found for this basename: GLUCAP,  in the last 168 hours  Microbiology: Results for orders placed during the hospital encounter of 12/18/13  CULTURE, BLOOD (ROUTINE X 2)     Status: None   Collection Time    12/18/13  7:48 PM       Result Value Ref Range Status   Specimen Description BLOOD ARM LEFT   Final   Special Requests BOTTLES DRAWN AEROBIC AND ANAEROBIC 10CC   Final   Culture  Setup Time     Final   Value: 12/19/2013 00:57     Performed at [facility]   Culture     Final   Value:        BLOOD CULTURE RECEIVED NO GROWTH TO DATE CULTURE WILL BE HELD FOR 5 DAYS BEFORE ISSUING A FINAL NEGATIVE REPORT     Performed at [facility]   Report Status PENDING   Incomplete  CULTURE, BLOOD (ROUTINE X 2)     Status: None   Collection Time    12/18/13  7:53 PM      Result Value Ref Range Status   Specimen Description BLOOD HAND LEFT   Final   Special Requests BOTTLES DRAWN AEROBIC ONLY 10CC   Final   Culture  Setup Time     Final   Value: 12/19/2013 00:58     Performed at [facility]   Culture     Final   Value:        BLOOD CULTURE RECEIVED NO GROWTH TO DATE CULTURE WILL BE HELD FOR 5 DAYS BEFORE ISSUING A FINAL NEGATIVE REPORT     Performed at [facility]   Report Status PENDING   Incomplete    Coagulation Studies: No results found for this basename: LABPROT, INR,  in the last 72 hours  Imaging: Mr Jeri Cos Wo Contrast  12/22/2013   CLINICAL DATA:  43 year old female with history of heavy smoking and alcohol use presenting with history of gradual but worsening paresthesias and weakness in lower extremities over the past 2 months. Right face cellulitis.  EXAM: MRI HEAD WITHOUT AND WITH CONTRAST  TECHNIQUE: Multiplanar, multiecho pulse sequences of the brain and surrounding structures were obtained without and with intravenous contrast.  CONTRAST:  42mL MULTIHANCE GADOBENATE DIMEGLUMINE 529 MG/ML IV SOLN  COMPARISON:  12/20/2013 cervical spine MR. 12/18/2013 facial CT. 07/19/2006 head CT. No comparison brain MR.  FINDINGS: Exam is motion degraded.  No acute infarct.  No intracranial hemorrhage.  No intracranial mass or abnormal enhancement.  No hydrocephalus.  No white matter  abnormality noted to suggest presence of underlying demyelinating process.  Decreased signal intensity of bone marrow may be related to patient's anemia.  Cervical medullary junction, pituitary region, pineal region and orbital structures unremarkable.  Patient's right facial cellulitis not as well delineated on the present MR. Scattered lymph nodes in the neck are noted. These may be reactive in origin.  IMPRESSION: Exam is motion degraded.  No acute infarct.  No intracranial mass or abnormal enhancement.  No white matter abnormality noted to suggest presence of underlying demyelinating process.  Decreased signal intensity of bone marrow may be related to patient's anemia.  Patient's right facial cellulitis not as well delineated on the present MR. Scattered lymph nodes in the neck are noted. These may be reactive in origin.   Electronically Signed   By: Chauncey Cruel M.D.   On: 12/22/2013 10:03   US Abdomen Limited  12/23/2013   CLINICAL DATA:  Lower extremity swelling. The patient is currently undergoing treatment for facial cellulitis.  EXAM: US ABDOMEN LIMITED - RIGHT UPPER QUADRANT  COMPARISON:  No similar prior exam is available at this institution for comparison or on [facility] PACS.  FINDINGS: Gallbladder:  Tiny dependent mobile stones or sludge noted without gallbladder wall thickening, pericholecystic fluid, or sonographic Murphy sign.  Common bile duct:  Diameter: 2 mm  Liver:  Diffusely inhomogeneously echogenic without focal mass.  Small amount of ascites.  IMPRESSION: Tiny gallstones or sludge without other sonographic evidence for acute cholecystitis.  Inhomogeneous liver echotexture, globally increased, likely due to steatosis. Given the degree of inhomogeneity, if further evaluation for possible underlying hepatic pathology is needed, outpatient nonemergent abdominal MRI with contrast could be helpful.   Electronically Signed   By: Conchita Paris M.D.   On: 12/23/2013 08:59    Medications:  I  have reviewed the patient's current medications. Scheduled: . ampicillin-sulbactam (UNASYN) IV  3 g Intravenous Q6H  . antiseptic oral rinse  15 mL Mouth Rinse BID  . DULoxetine  30 mg Oral Daily  . folic acid  1 mg Oral Daily  . furosemide  40 mg Oral Daily  . gabapentin  400 mg Oral TID  . multivitamin with minerals  1 tablet Oral Daily  . polyethylene glycol  17 g Oral Daily  . potassium chloride  20 mEq Oral Daily  . thiamine  100 mg Oral Daily   Or  . thiamine  100 mg Intravenous Daily  . vitamin B-12  1,000 mcg Oral Daily    Assessment/Plan: Patient without improvement.  Heavy metal screen, B1, SPEP and anti-Hu remain pending.  MRI of the brain reviewed and shows no evidence of demyelinating disease.  Recommendations: 1.  LP in the AM to rule out a paraneoplastic process.  Procedure explained and consent signed.  SQ heparin discontinued.  SCD's to be placed.  LP tray to the room. 2.  Will continue to follow up lab work 3.  Sural nerve biopsy pending    LOS: 5 days   Alexis Goodell, MD [facility] [phone] 12/23/2013  9:36 AM

## 2013-12-23 NOTE — Consult Note (Signed)
Physical Medicine and Rehabilitation Consult  Reason for Consult: Paraesthesias BLE as well as facial cellulitis.  Referring Physician: Dr. Sloan Leiter   HPI: [name] is a 43 y.o. female with history of tobacco use, daily ETOH use; who was admitted on 12/18/13 with 2 month history of ascending BLE pain with weakness with numbness BUE as well as redness of face three days PTA. Patient with decline in mobility with inability to walk and 10/10 pain. Patient noted to have leucocytosis with WBC 24,000 as well as electrolyte abnormalities. CT maxillofacial area with right facial cellulitis and periapical abscess involving right mandibular molar. She was started on IV antibiotics and neurology consulted for input on paraesthesias. MRI cervical spine, thoracic spine as well as brain negative for acute changes or pathology. UDS positive for opiates and THC. Hgb A1c-4.8. Vit B 12-381. Urine for heavy metals pending. BC x 2 negative so far. Has had a drop in H/H from 10.2-->7.8.  CCS consulted for muscle and nerve biopsy.  PT evaluation done and patient limited by pain. Therapy team and MD recommending CIR.    ROS  History reviewed. No pertinent past medical history.  Past Surgical History  Procedure Laterality Date  . Tubal ligation     Family History  Problem Relation Age of Onset  . Diabetes Father   . Diabetes Maternal Aunt   . Heart disease Maternal Aunt   . Diabetes Maternal Uncle   . Heart disease Maternal Uncle   . Drug abuse Maternal Grandmother   . Cancer Maternal Grandfather   . Hypertension Mother   . Raynaud syndrome Mother    Social History:  reports that she has been smoking.  She does not have any smokeless tobacco history on file. She reports that she drinks alcohol. She reports that she does not use illicit drugs.  Allergies: No Known Allergies  Medications Prior to Admission  Medication Sig Dispense Refill  . Multiple Vitamins-Minerals (MULTIVITAMIN WITH MINERALS)  tablet Take 1 tablet by mouth daily.      . Gabapentin, PHN, 300 MG TABS Take 300 mg by mouth once. Day 1: 300 mg (1 tab), Day 2: 300 mg twice daily, Day 3: 300 mg 3 times daily; dose may be titrated as needed for pain relief (range: 1,800 to 3,600 mg/day)  20 tablet  0  . HYDROcodone-acetaminophen (NORCO/VICODIN) 5-325 MG per tablet Take 2 tablets by mouth every 4 (four) hours as needed for moderate pain.  20 tablet  0  . traMADol (ULTRAM) 50 MG tablet Take 1 tablet (50 mg total) by mouth every 6 (six) hours as needed.  15 tablet  0    Home: Home Living Family/patient expects to be discharged to:: Private residence Living Arrangements: Children Available Help at Discharge: Family;Available PRN/intermittently Home Layout: Two level;Able to live on main level with bedroom/bathroom Home Equipment: Wheelchair - manual  Functional History: Prior Function Level of Independence: Independent Comments: Until feet began bothering her 2 months ago pt independent. Mobility has been declining as pain incr and just prior to hospitalization pt using w/c to get from her bed to the bathroom. Functional Status:  Mobility: Bed Mobility Overal bed mobility: Modified Independent Bed Mobility: Supine to Sit Supine to sit: Modified independent (Device/Increase time);HOB elevated Sit to supine: Min assist General bed mobility comments: relied on handrails; required incr time due to pain  Transfers Overall transfer level: Needs assistance Equipment used: Rolling walker (2 wheeled) Transfers: Sit to/from Stand Sit to Stand:  Min assist;+2 physical assistance General transfer comment: 2 person (A) for safety and to maintain balance; cues for hand placment and sequencing with RW Ambulation/Gait Ambulation/Gait assistance: +2 physical assistance;Min assist Ambulation Distance (Feet): 16 Feet (4 feet forward and backward; then 8 feet after rest) Assistive device: Rolling walker (2 wheeled) Gait  Pattern/deviations: Step-to pattern;Ataxic;Narrow base of support;Trunk flexed (decr foot flat on Rt LE ) Gait velocity: very decr due to pain Gait velocity interpretation: Below normal speed for age/gender General Gait Details: pt requires 2 person (A) to maintain balance and manage RW; pt with decr foot flat on Rt LE and ataxic secondary to numbness and decreased sensation on bil feet; able to rest and increase ambulation this session     ADL:    Cognition: Cognition Overall Cognitive Status: Within Functional Limits for tasks assessed Orientation Level: Oriented X4 Cognition Arousal/Alertness: Awake/alert Behavior During Therapy: WFL for tasks assessed/performed Overall Cognitive Status: Within Functional Limits for tasks assessed  Blood pressure 164/102, pulse 106, temperature 98.3 F (36.8 C), temperature source Oral, resp. rate 18, height 5\' 5"  (1.651 m), weight 58.922 kg (129 lb 14.4 oz), last menstrual period 11/03/2013, SpO2 92.00%. Physical Exam  Nursing note and vitals reviewed.   No results found for this or any previous visit (from the past 24 hour(s)). Mr Jeri Cos Wo Contrast  12/22/2013   CLINICAL DATA:  43 year old female with history of heavy smoking and alcohol use presenting with history of gradual but worsening paresthesias and weakness in lower extremities over the past 2 months. Right face cellulitis.  EXAM: MRI HEAD WITHOUT AND WITH CONTRAST  TECHNIQUE: Multiplanar, multiecho pulse sequences of the brain and surrounding structures were obtained without and with intravenous contrast.  CONTRAST:  10mL MULTIHANCE GADOBENATE DIMEGLUMINE 529 MG/ML IV SOLN  COMPARISON:  12/20/2013 cervical spine MR. 12/18/2013 facial CT. 07/19/2006 head CT. No comparison brain MR.  FINDINGS: Exam is motion degraded.  No acute infarct.  No intracranial hemorrhage.  No intracranial mass or abnormal enhancement.  No hydrocephalus.  No white matter abnormality noted to suggest presence of  underlying demyelinating process.  Decreased signal intensity of bone marrow may be related to patient's anemia.  Cervical medullary junction, pituitary region, pineal region and orbital structures unremarkable.  Patient's right facial cellulitis not as well delineated on the present MR. Scattered lymph nodes in the neck are noted. These may be reactive in origin.  IMPRESSION: Exam is motion degraded.  No acute infarct.  No intracranial mass or abnormal enhancement.  No white matter abnormality noted to suggest presence of underlying demyelinating process.  Decreased signal intensity of bone marrow may be related to patient's anemia.  Patient's right facial cellulitis not as well delineated on the present MR. Scattered lymph nodes in the neck are noted. These may be reactive in origin.   Electronically Signed   By: Chauncey Cruel M.D.   On: 12/22/2013 10:03    Assessment/Plan: 1. Diagnosis: lower extremity paresthesias  RECOMMENDATIONS: This patient's condition is appropriate for continued rehabilitative care in the following setting: HH Therapy  Comment: No medical necessity for inpatient rehab. At min assist level already as well.  Meredith Staggers, MD, [facility]     12/23/2013

## 2013-12-23 NOTE — Progress Notes (Signed)
  Echocardiogram 2D Echocardiogram has been performed.  Donata Clay 12/23/2013, 10:17 AM

## 2013-12-23 NOTE — Consult Note (Addendum)
Reason for Consult: Nerve biopsy Referring Physician: Ziya Coonrod is an 43 y.o. female.  HPI: Patient is a 43 year old individual who's had a progressive history of numbness and with burning dysesthesias in lower extremities combined with weakness she presented with a high white count 24,000 and hyponatremia and hypochloremia. No etiology can be identified further difficulties with her some burning dysesthesias and weakness and a sural nerve biopsy is now being requested.  History reviewed. No pertinent past medical history.  Past Surgical History  Procedure Laterality Date  . Tubal ligation      Family History  Problem Relation Age of Onset  . Diabetes Father   . Diabetes Maternal Aunt   . Heart disease Maternal Aunt   . Diabetes Maternal Uncle   . Heart disease Maternal Uncle   . Drug abuse Maternal Grandmother   . Cancer Maternal Grandfather   . Hypertension Mother   . Raynaud syndrome Mother     Social History:  reports that she has been smoking.  She does not have any smokeless tobacco history on file. She reports that she drinks alcohol. She reports that she does not use illicit drugs.  Allergies: No Known Allergies  Medications: I have reviewed the patient's current medications.  Results for orders placed during the hospital encounter of 12/18/13 (from the past 48 hour(s))  CBC     Status: Abnormal   Collection Time    12/22/13  6:50 AM      Result Value Ref Range   WBC 16.7 (*) 4.0 - 10.5 K/uL   RBC 2.32 (*) 3.87 - 5.11 MIL/uL   Hemoglobin 7.8 (*) 12.0 - 15.0 g/dL   HCT 23.7 (*) 36.0 - 46.0 %   MCV 102.2 (*) 78.0 - 100.0 fL   MCH 33.6  26.0 - 34.0 pg   MCHC 32.9  30.0 - 36.0 g/dL   RDW 15.4  11.5 - 15.5 %   Platelets 309  150 - 400 K/uL  BASIC METABOLIC PANEL     Status: Abnormal   Collection Time    12/22/13  6:50 AM      Result Value Ref Range   Sodium 139  137 - 147 mEq/L   Potassium 3.3 (*) 3.7 - 5.3 mEq/L   Chloride 100  96 - 112 mEq/L   CO2  26  19 - 32 mEq/L   Glucose, Bld 100 (*) 70 - 99 mg/dL   BUN <3 (*) 6 - 23 mg/dL   Creatinine, Ser 0.43 (*) 0.50 - 1.10 mg/dL   Calcium 8.3 (*) 8.4 - 10.5 mg/dL   GFR calc non Af Amer >90  >90 mL/min   GFR calc Af Amer >90  >90 mL/min   Comment: (NOTE)     The eGFR has been calculated using the CKD EPI equation.     This calculation has not been validated in all clinical situations.     eGFR's persistently <90 mL/min signify possible Chronic Kidney     Disease.  CBC     Status: Abnormal   Collection Time    12/23/13  7:15 AM      Result Value Ref Range   WBC 19.5 (*) 4.0 - 10.5 K/uL   RBC 2.34 (*) 3.87 - 5.11 MIL/uL   Hemoglobin 8.0 (*) 12.0 - 15.0 g/dL   HCT 23.9 (*) 36.0 - 46.0 %   MCV 102.1 (*) 78.0 - 100.0 fL   MCH 34.2 (*) 26.0 - 34.0 pg   MCHC  33.5  30.0 - 36.0 g/dL   RDW 15.8 (*) 11.5 - 15.5 %   Platelets 333  150 - 400 K/uL  COMPREHENSIVE METABOLIC PANEL     Status: Abnormal   Collection Time    12/23/13  7:15 AM      Result Value Ref Range   Sodium 139  137 - 147 mEq/L   Potassium 3.5 (*) 3.7 - 5.3 mEq/L   Chloride 97  96 - 112 mEq/L   CO2 25  19 - 32 mEq/L   Glucose, Bld 93  70 - 99 mg/dL   BUN <3 (*) 6 - 23 mg/dL   Creatinine, Ser 0.47 (*) 0.50 - 1.10 mg/dL   Calcium 8.4  8.4 - 10.5 mg/dL   Total Protein 6.2  6.0 - 8.3 g/dL   Albumin 2.7 (*) 3.5 - 5.2 g/dL   AST 80 (*) 0 - 37 U/L   ALT 13  0 - 35 U/L   Alkaline Phosphatase 197 (*) 39 - 117 U/L   Total Bilirubin 0.4  0.3 - 1.2 mg/dL   GFR calc non Af Amer >90  >90 mL/min   GFR calc Af Amer >90  >90 mL/min   Comment: (NOTE)     The eGFR has been calculated using the CKD EPI equation.     This calculation has not been validated in all clinical situations.     eGFR's persistently <90 mL/min signify possible Chronic Kidney     Disease.  PRO B NATRIURETIC PEPTIDE     Status: Abnormal   Collection Time    12/23/13  7:15 AM      Result Value Ref Range   Pro B Natriuretic peptide (BNP) 4017.0 (*) 0 - 125 pg/mL     Mr Jeri Cos Wo Contrast  12/22/2013   CLINICAL DATA:  43 year old female with history of heavy smoking and alcohol use presenting with history of gradual but worsening paresthesias and weakness in lower extremities over the past 2 months. Right face cellulitis.  EXAM: MRI HEAD WITHOUT AND WITH CONTRAST  TECHNIQUE: Multiplanar, multiecho pulse sequences of the brain and surrounding structures were obtained without and with intravenous contrast.  CONTRAST:  73mL MULTIHANCE GADOBENATE DIMEGLUMINE 529 MG/ML IV SOLN  COMPARISON:  12/20/2013 cervical spine MR. 12/18/2013 facial CT. 07/19/2006 head CT. No comparison brain MR.  FINDINGS: Exam is motion degraded.  No acute infarct.  No intracranial hemorrhage.  No intracranial mass or abnormal enhancement.  No hydrocephalus.  No white matter abnormality noted to suggest presence of underlying demyelinating process.  Decreased signal intensity of bone marrow may be related to patient's anemia.  Cervical medullary junction, pituitary region, pineal region and orbital structures unremarkable.  Patient's right facial cellulitis not as well delineated on the present MR. Scattered lymph nodes in the neck are noted. These may be reactive in origin.  IMPRESSION: Exam is motion degraded.  No acute infarct.  No intracranial mass or abnormal enhancement.  No white matter abnormality noted to suggest presence of underlying demyelinating process.  Decreased signal intensity of bone marrow may be related to patient's anemia.  Patient's right facial cellulitis not as well delineated on the present MR. Scattered lymph nodes in the neck are noted. These may be reactive in origin.   Electronically Signed   By: Chauncey Cruel M.D.   On: 12/22/2013 10:03   US Abdomen Limited  12/23/2013   CLINICAL DATA:  Lower extremity swelling. The patient is currently undergoing treatment for facial cellulitis.  EXAM: US ABDOMEN LIMITED - RIGHT UPPER QUADRANT  COMPARISON:  No similar prior exam is  available at this institution for comparison or on [facility] PACS.  FINDINGS: Gallbladder:  Tiny dependent mobile stones or sludge noted without gallbladder wall thickening, pericholecystic fluid, or sonographic Murphy sign.  Common bile duct:  Diameter: 2 mm  Liver:  Diffusely inhomogeneously echogenic without focal mass.  Small amount of ascites.  IMPRESSION: Tiny gallstones or sludge without other sonographic evidence for acute cholecystitis.  Inhomogeneous liver echotexture, globally increased, likely due to steatosis. Given the degree of inhomogeneity, if further evaluation for possible underlying hepatic pathology is needed, outpatient nonemergent abdominal MRI with contrast could be helpful.   Electronically Signed   By: Conchita Paris M.D.   On: 12/23/2013 08:59    Review of Systems  Constitutional: Positive for malaise/fatigue.  Neurological: Positive for tingling, sensory change and weakness.   Blood pressure 164/102, pulse 106, temperature 98.3 F (36.8 C), temperature source Oral, resp. rate 18, height $RemoveBe'5\' 5"'ypPmkVvCR$  (1.651 m), weight 58.922 kg (129 lb 14.4 oz), last menstrual period 11/03/2013, SpO2 92.00%. Physical Exam  Constitutional: She appears well-developed and well-nourished.  HENT:  Head: Normocephalic and atraumatic.  Eyes: Conjunctivae and EOM are normal. Pupils are equal, round, and reactive to light.  Neck: Normal range of motion. Neck supple.  Musculoskeletal:  Discretely tender regions on the legs and the feet particularly. Very tender to palpation with burning dysesthetic pain with light touch. There is 3+ pitting edema at both ankles. Her function appears to be 4/5 in iliopsoas quadricep tibialis anterior and gastrocs with difficulty testing secondary to the pain component the reflexes cannot be elicited in the patellae are the Achilles.  Skin: Skin is dry.  Psychiatric: She has a normal mood and affect. Her behavior is normal. Judgment and thought content normal.     Assessment/Plan: Neuropathic deterioration of function.  Plan: Sural nerve biopsy Kristeen Miss 12/23/2013, 12:42 PM

## 2013-12-23 NOTE — Progress Notes (Signed)
ANTIBIOTIC CONSULT NOTE - INITIAL  Pharmacy Consult for vancomycin Indication: facial cellutlits  No Known Allergies  Patient Measurements: Height: 5\' 5"  (165.1 cm) Weight: 129 lb 14.4 oz (58.922 kg) IBW/kg (Calculated) : 57 Adjusted Body Weight:   Vital Signs: Temp: 98 F (36.7 C) (04/13 1431) Temp src: Oral (04/13 1431) BP: 180/100 mmHg (04/13 1530) Pulse Rate: 99 (04/13 1431) Intake/Output from previous day: 04/12 0701 - 04/13 0700 In: 1212 [P.O.:462; IV Piggyback:750] Out: -  Intake/Output from this shift: Total I/O In: 544 [P.O.:444; IV Piggyback:100] Out: -   Labs:  Recent Labs  12/21/13 0750 12/22/13 0650 12/23/13 0715  WBC 13.7* 16.7* 19.5*  HGB 8.1* 7.8* 8.0*  PLT 304 309 333  CREATININE 0.45* 0.43* 0.47*   Estimated Creatinine Clearance: 82.4 ml/min (by C-G formula based on Cr of 0.47). No results found for this basename: VANCOTROUGH, VANCOPEAK, VANCORANDOM, [place], GENTPEAK, GENTRANDOM, TOBRATROUGH, TOBRAPEAK, TOBRARND, AMIKACINPEAK, AMIKACINTROU, AMIKACIN,  in the last 72 hours   Microbiology: Recent Results (from the past 720 hour(s))  CULTURE, BLOOD (ROUTINE X 2)     Status: None   Collection Time    12/18/13  7:48 PM      Result Value Ref Range Status   Specimen Description BLOOD ARM LEFT   Final   Special Requests BOTTLES DRAWN AEROBIC AND ANAEROBIC 10CC   Final   Culture  Setup Time     Final   Value: 12/19/2013 00:57     Performed at [facility]   Culture     Final   Value:        BLOOD CULTURE RECEIVED NO GROWTH TO DATE CULTURE WILL BE HELD FOR 5 DAYS BEFORE ISSUING A FINAL NEGATIVE REPORT     Performed at [facility]   Report Status PENDING   Incomplete  CULTURE, BLOOD (ROUTINE X 2)     Status: None   Collection Time    12/18/13  7:53 PM      Result Value Ref Range Status   Specimen Description BLOOD HAND LEFT   Final   Special Requests BOTTLES DRAWN AEROBIC ONLY 10CC   Final   Culture  Setup Time     Final   Value: 12/19/2013 00:58     Performed at [facility]   Culture     Final   Value:        BLOOD CULTURE RECEIVED NO GROWTH TO DATE CULTURE WILL BE HELD FOR 5 DAYS BEFORE ISSUING A FINAL NEGATIVE REPORT     Performed at [facility]   Report Status PENDING   Incomplete    Medical History: History reviewed. No pertinent past medical history.  Medications:  Scheduled:  . ampicillin-sulbactam (UNASYN) IV  3 g Intravenous Q6H  . antiseptic oral rinse  15 mL Mouth Rinse BID  . carvedilol  6.25 mg Oral BID WC  . DULoxetine  30 mg Oral Daily  . folic acid  1 mg Oral Daily  . furosemide  40 mg Oral Daily  . gabapentin  400 mg Oral TID  . multivitamin with minerals  1 tablet Oral Daily  . polyethylene glycol  17 g Oral Daily  . potassium chloride  20 mEq Oral Daily  . thiamine  100 mg Oral Daily  . vitamin B-12  1,000 mcg Oral Daily   Infusions:   Assessment: 43 yo female with facial cellulitis who was on vancomcyin at one point will be put back on this antibiotic per MD's  request.  Patient's last vancomycin dose was 750mg  at 0711 on 12/23/13  Goal of Therapy:  Vancomycin trough level 10-15 mcg/ml  Plan:  1) Continue vancomycin 750mg  iv q12h, next dose at 1900 tonight  Tsz-Yin Madisen Ludvigsen 12/23/2013,3:41 PM

## 2013-12-23 NOTE — Progress Notes (Signed)
Physical Therapy Treatment Patient Details Name: [name] MRN: [id number] DOB: Feb 01, 1971 Today's Date: 12/23/2013    History of Present Illness Pt adm with facial cellulitis and bil lower extremity neuropathy. Work up of neuropathy in progress.  No significant PMHx listed in chart.     PT Comments    Pt is progressing very slowly with mobility.  She is very painful in standing and has quite a bit of edema in bil legs.  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  She is unable to walk unassisted and has had an acute fall.    Follow Up Recommendations  SNF ([facility] did not accept and pt does not have 24/7 assist)     Equipment Recommendations  Rolling walker with 5" wheels    Recommendations for Other Services   None     Precautions / Restrictions Precautions Precautions: Fall Precaution Comments: h/o falls    Mobility  Bed Mobility Overal bed mobility: Modified Independent             General bed mobility comments: uses railing for leverage, sat up from flat bed.   Transfers Overall transfer level: Needs assistance Equipment used: Rolling walker (2 wheeled) Transfers: Sit to/from Stand Sit to Stand: Min assist         General transfer comment: Min assist to stand from bed to support trunk during transitions.  Verbal cues for safe hand placement.   Ambulation/Gait Ambulation/Gait assistance: +2 safety/equipment;Mod assist Ambulation Distance (Feet): 30 Feet (15' x 2) Assistive device: Rolling walker (2 wheeled) Gait Pattern/deviations: Step-to pattern;Decreased stride length;Antalgic Gait velocity: decreased Gait velocity interpretation: <1.8 ft/sec, indicative of risk for recurrent falls General Gait Details: Pt with very painful gait pattern.  Pt unable to wear socks due to pain, so stayed in room.  Chair to follow for safety.  Up to mod assist to  support trunk as pain increased with each step.  Pt watching her feet because she cannot tell where they are unless she looks down at them.  Continues significant bil leg edema (thigh down to ankles)       Balance Overall balance assessment: Needs assistance Sitting-balance support: Feet supported Sitting balance-Leahy Scale: Good     Standing balance support: Bilateral upper extremity supported Standing balance-Leahy Scale: Poor Standing balance comment: needs assist to stand due to painful feet                    Cognition Arousal/Alertness: Awake/alert Behavior During Therapy: WFL for tasks assessed/performed Overall Cognitive Status: Within Functional Limits for tasks assessed                      Exercises General Exercises - Lower Extremity Ankle Circles/Pumps: AROM;Both;10 reps;Seated Long Arc Quad: AROM;Both;10 reps;Seated Hip ABduction/ADduction: AROM;Both;10 reps;Seated (adduction only against pillow for resistance.) Hip Flexion/Marching: AROM;Both;10 reps;Seated Other Exercises Other Exercises: HEP handout given on above.          Pertinent Vitals/Pain See vitals flow sheet.            PT Goals (current goals can now be found in the care plan section) Acute Rehab PT Goals Patient Stated Goal: to be able to walk Progress towards PT goals: Progressing toward goals    Frequency  Min 2X/week    PT Plan Discharge plan needs to be updated;Frequency needs  to be updated       End of Session Equipment Utilized During Treatment: Gait belt Activity Tolerance: Patient limited by pain Patient left: in chair;with call bell/phone within reach     Time: 1124-1149 PT Time Calculation (min): 25 min  Charges:  $Gait Training: 8-22 mins $Therapeutic Exercise: 8-22 mins                      Sanai Frick B. [place], [place], DPT [phone]   12/23/2013, 2:31 PM

## 2013-12-24 ENCOUNTER — Inpatient Hospital Stay (HOSPITAL_COMMUNITY): Payer: Medicaid Other | Admitting: Certified Registered Nurse Anesthetist

## 2013-12-24 ENCOUNTER — Encounter (HOSPITAL_COMMUNITY)
Admission: EM | Disposition: A | Discharge status: Skilled Nursing Facility | Payer: Medicaid Other | Source: Home / Self Care | Attending: Internal Medicine

## 2013-12-24 ENCOUNTER — Encounter (HOSPITAL_COMMUNITY): Payer: Medicaid Other | Admitting: Certified Registered Nurse Anesthetist

## 2013-12-24 DIAGNOSIS — K045 Chronic apical periodontitis: Secondary | ICD-10-CM

## 2013-12-24 DIAGNOSIS — L0201 Cutaneous abscess of face: Secondary | ICD-10-CM | POA: Diagnosis not present

## 2013-12-24 DIAGNOSIS — R6510 Systemic inflammatory response syndrome (SIRS) of non-infectious origin without acute organ dysfunction: Secondary | ICD-10-CM | POA: Diagnosis not present

## 2013-12-24 DIAGNOSIS — I2789 Other specified pulmonary heart diseases: Secondary | ICD-10-CM | POA: Diagnosis not present

## 2013-12-24 DIAGNOSIS — G629 Polyneuropathy, unspecified: Secondary | ICD-10-CM | POA: Diagnosis present

## 2013-12-24 DIAGNOSIS — R220 Localized swelling, mass and lump, head: Secondary | ICD-10-CM

## 2013-12-24 DIAGNOSIS — G609 Hereditary and idiopathic neuropathy, unspecified: Secondary | ICD-10-CM | POA: Diagnosis not present

## 2013-12-24 DIAGNOSIS — L03211 Cellulitis of face: Secondary | ICD-10-CM | POA: Diagnosis not present

## 2013-12-24 DIAGNOSIS — K0401 Reversible pulpitis: Secondary | ICD-10-CM

## 2013-12-24 DIAGNOSIS — R221 Localized swelling, mass and lump, neck: Secondary | ICD-10-CM

## 2013-12-24 HISTORY — PX: SURAL NERVE BX: SHX2476

## 2013-12-24 LAB — BASIC METABOLIC PANEL
BUN: 3 mg/dL — ABNORMAL LOW (ref 6–23)
CO2: 25 mEq/L (ref 19–32)
Calcium: 8.4 mg/dL (ref 8.4–10.5)
Chloride: 97 mEq/L (ref 96–112)
Creatinine, Ser: 0.47 mg/dL — ABNORMAL LOW (ref 0.50–1.10)
GFR calc Af Amer: >90 mL/min (ref >90)
GFR calc non Af Amer: >90 mL/min (ref >90)
Glucose, Bld: 108 mg/dL — ABNORMAL HIGH (ref 70–99)
Potassium: 3.7 mEq/L (ref 3.7–5.3)
Sodium: 138 mEq/L (ref 137–147)

## 2013-12-24 LAB — CSF CELL COUNT WITH DIFFERENTIAL
RBC Count, CSF: 1 /mm3 — ABNORMAL HIGH
Tube #: 1
WBC, CSF: 0 /mm3 (ref 0–5)

## 2013-12-24 LAB — CBC
HCT: 23.9 % — ABNORMAL LOW (ref 36.0–46.0)
Hemoglobin: 8.1 g/dL — ABNORMAL LOW (ref 12.0–15.0)
MCH: 34.6 pg — ABNORMAL HIGH (ref 26.0–34.0)
MCHC: 33.9 g/dL (ref 30.0–36.0)
MCV: 102.1 fL — ABNORMAL HIGH (ref 78.0–100.0)
Platelets: 332 10*3/uL (ref 150–400)
RBC: 2.34 MIL/uL — ABNORMAL LOW (ref 3.87–5.11)
RDW: 16.4 % — ABNORMAL HIGH (ref 11.5–15.5)
WBC: 18.9 10*3/uL — ABNORMAL HIGH (ref 4.0–10.5)

## 2013-12-24 LAB — PROTEIN ELECTROPHORESIS, SERUM
Albumin ELP: 49.6 % — ABNORMAL LOW (ref 55.8–66.1)
Alpha-1-Globulin: 8.4 % — ABNORMAL HIGH (ref 2.9–4.9)
Alpha-2-Globulin: 13.2 % — ABNORMAL HIGH (ref 7.1–11.8)
Beta 2: 7.2 % — ABNORMAL HIGH (ref 3.2–6.5)
Beta Globulin: 6.5 % (ref 4.7–7.2)
Gamma Globulin: 15.1 % (ref 11.1–18.8)
M-Spike, %: NOT DETECTED g/dL
Total Protein ELP: 5.4 g/dL — ABNORMAL LOW (ref 6.0–8.3)

## 2013-12-24 LAB — HEPATITIS PANEL, ACUTE
HCV Ab: NEGATIVE
Hep A IgM: NONREACTIVE
Hep B C IgM: NONREACTIVE
Hepatitis B Surface Ag: NEGATIVE

## 2013-12-24 LAB — GRAM STAIN

## 2013-12-24 LAB — PROTEIN AND GLUCOSE, CSF
Glucose, CSF: 60 mg/dL (ref 43–76)
Total  Protein, CSF: 34 mg/dL (ref 15–45)

## 2013-12-24 LAB — SURGICAL PCR SCREEN
MRSA, PCR: NEGATIVE
Staphylococcus aureus: NEGATIVE

## 2013-12-24 SURGERY — SURAL NERVE BIOPSY
Anesthesia: Monitor Anesthesia Care | Site: Leg Lower | Laterality: Right

## 2013-12-24 SURGERY — SURAL NERVE BIOPSY
Anesthesia: General | Laterality: Right

## 2013-12-24 MED ORDER — FENTANYL CITRATE 0.05 MG/ML IJ SOLN
INTRAMUSCULAR | Status: AC
  Filled 2013-12-24: qty 5

## 2013-12-24 MED ORDER — OXYCODONE HCL 5 MG PO TABS: 5.0000 mg | ORAL_TABLET | Freq: Once | ORAL | Status: DC | PRN

## 2013-12-24 MED ORDER — HYDROMORPHONE HCL PF 1 MG/ML IJ SOLN
0.5000 mg | INTRAMUSCULAR | Status: DC | PRN
  Administered 2013-12-25: 0.5 mg via INTRAVENOUS
  Administered 2013-12-25 – 2013-12-27 (×16): 1 mg via INTRAVENOUS
  Filled 2013-12-24 (×16): qty 1

## 2013-12-24 MED ORDER — MIDAZOLAM HCL 5 MG/5ML IJ SOLN
INTRAMUSCULAR | Status: DC | PRN
  Administered 2013-12-24: 1 mg via INTRAVENOUS
  Administered 2013-12-24: 2 mg via INTRAVENOUS
  Administered 2013-12-24: 1 mg via INTRAVENOUS

## 2013-12-24 MED ORDER — FENTANYL CITRATE 0.05 MG/ML IJ SOLN
INTRAMUSCULAR | Status: DC | PRN
  Administered 2013-12-24: 100 ug via INTRAVENOUS
  Administered 2013-12-24 (×3): 50 ug via INTRAVENOUS

## 2013-12-24 MED ORDER — ARTIFICIAL TEARS OP OINT
TOPICAL_OINTMENT | OPHTHALMIC | Status: AC
  Filled 2013-12-24: qty 3.5

## 2013-12-24 MED ORDER — CARVEDILOL 12.5 MG PO TABS
12.5000 mg | ORAL_TABLET | Freq: Two times a day (BID) | ORAL | Status: DC
  Administered 2013-12-24 – 2013-12-27 (×6): 12.5 mg via ORAL
  Filled 2013-12-24 (×9): qty 1

## 2013-12-24 MED ORDER — SODIUM CHLORIDE 0.9 % IJ SOLN
3.0000 mL | Freq: Two times a day (BID) | INTRAMUSCULAR | Status: DC
  Administered 2013-12-24 – 2013-12-26 (×2): 3 mL via INTRAVENOUS

## 2013-12-24 MED ORDER — MEPERIDINE HCL 25 MG/ML IJ SOLN: 6.2500 mg | INTRAMUSCULAR | Status: DC | PRN

## 2013-12-24 MED ORDER — ACETAMINOPHEN 650 MG RE SUPP: 650.0000 mg | RECTAL | Status: DC | PRN

## 2013-12-24 MED ORDER — ONDANSETRON HCL 4 MG/2ML IJ SOLN
INTRAMUSCULAR | Status: AC
  Filled 2013-12-24: qty 2

## 2013-12-24 MED ORDER — LIDOCAINE HCL (CARDIAC) 20 MG/ML IV SOLN
INTRAVENOUS | Status: AC
  Filled 2013-12-24: qty 5

## 2013-12-24 MED ORDER — PROPOFOL 10 MG/ML IV BOLUS
INTRAVENOUS | Status: AC
  Filled 2013-12-24: qty 20

## 2013-12-24 MED ORDER — ACETAMINOPHEN 325 MG PO TABS: 650.0000 mg | ORAL_TABLET | ORAL | Status: DC | PRN

## 2013-12-24 MED ORDER — PHENOL 1.4 % MT LIQD
1.0000 | OROMUCOSAL | Status: DC | PRN
  Filled 2013-12-24: qty 177

## 2013-12-24 MED ORDER — ESMOLOL HCL 10 MG/ML IV SOLN
INTRAVENOUS | Status: AC
  Filled 2013-12-24: qty 10

## 2013-12-24 MED ORDER — LIDOCAINE-EPINEPHRINE 1 %-1:100000 IJ SOLN
INTRAMUSCULAR | Status: DC | PRN
  Administered 2013-12-24: 9 mL

## 2013-12-24 MED ORDER — HYDRALAZINE HCL 20 MG/ML IJ SOLN: 20.0000 mg | Freq: Once | INTRAMUSCULAR | Status: DC

## 2013-12-24 MED ORDER — SODIUM CHLORIDE 0.9 % IV SOLN
250.0000 mL | INTRAVENOUS | Status: DC
  Administered 2013-12-25 (×3): via INTRAVENOUS

## 2013-12-24 MED ORDER — ALUM & MAG HYDROXIDE-SIMETH 200-200-20 MG/5ML PO SUSP: 30.0000 mL | Freq: Four times a day (QID) | ORAL | Status: DC | PRN

## 2013-12-24 MED ORDER — HYDROMORPHONE HCL PF 1 MG/ML IJ SOLN: 0.2500 mg | INTRAMUSCULAR | Status: DC | PRN

## 2013-12-24 MED ORDER — HYDRALAZINE HCL 20 MG/ML IJ SOLN
10.0000 mg | INTRAMUSCULAR | Status: DC | PRN
  Administered 2013-12-24 (×2): 10 mg via INTRAVENOUS
  Filled 2013-12-24 (×2): qty 1

## 2013-12-24 MED ORDER — MENTHOL 3 MG MT LOZG
1.0000 | LOZENGE | OROMUCOSAL | Status: DC | PRN
  Filled 2013-12-24: qty 9

## 2013-12-24 MED ORDER — ONDANSETRON HCL 4 MG/2ML IJ SOLN: 4.0000 mg | INTRAMUSCULAR | Status: DC | PRN

## 2013-12-24 MED ORDER — OXYCODONE HCL 5 MG/5ML PO SOLN: 5.0000 mg | Freq: Once | ORAL | Status: DC | PRN

## 2013-12-24 MED ORDER — PROMETHAZINE HCL 25 MG/ML IJ SOLN: 6.2500 mg | INTRAMUSCULAR | Status: DC | PRN

## 2013-12-24 MED ORDER — LACTATED RINGERS IV SOLN
INTRAVENOUS | Status: DC | PRN
  Administered 2013-12-24: 19:00:00 via INTRAVENOUS

## 2013-12-24 MED ORDER — LIDOCAINE HCL (CARDIAC) 20 MG/ML IV SOLN
INTRAVENOUS | Status: DC | PRN
  Administered 2013-12-24: 30 mg via INTRAVENOUS

## 2013-12-24 MED ORDER — BACITRACIN ZINC 500 UNIT/GM EX OINT
TOPICAL_OINTMENT | CUTANEOUS | Status: DC | PRN
  Administered 2013-12-24: 1 via TOPICAL

## 2013-12-24 MED ORDER — BUPIVACAINE HCL (PF) 0.25 % IJ SOLN
INTRAMUSCULAR | Status: DC | PRN
  Administered 2013-12-24: 9 mL

## 2013-12-24 MED ORDER — ROCURONIUM BROMIDE 50 MG/5ML IV SOLN
INTRAVENOUS | Status: AC
  Filled 2013-12-24: qty 1

## 2013-12-24 MED ORDER — PROPOFOL 10 MG/ML IV BOLUS
INTRAVENOUS | Status: DC | PRN
  Administered 2013-12-24 (×3): 20 mg via INTRAVENOUS
  Administered 2013-12-24: 30 mg via INTRAVENOUS
  Administered 2013-12-24 (×4): 10 mg via INTRAVENOUS
  Administered 2013-12-24: 50 mg via INTRAVENOUS
  Administered 2013-12-24: 20 mg via INTRAVENOUS

## 2013-12-24 MED ORDER — MIDAZOLAM HCL 2 MG/2ML IJ SOLN
INTRAMUSCULAR | Status: AC
  Filled 2013-12-24: qty 2

## 2013-12-24 MED ORDER — 0.9 % SODIUM CHLORIDE (POUR BTL) OPTIME
TOPICAL | Status: DC | PRN
  Administered 2013-12-24: 1000 mL

## 2013-12-24 MED ORDER — MIDAZOLAM HCL 2 MG/2ML IJ SOLN: 0.5000 mg | Freq: Once | INTRAMUSCULAR | Status: DC | PRN

## 2013-12-24 MED ORDER — SODIUM CHLORIDE 0.9 % IJ SOLN: 3.0000 mL | INTRAMUSCULAR | Status: DC | PRN

## 2013-12-24 SURGICAL SUPPLY — 61 items
BAG DECANTER FOR FLEXI CONT (MISCELLANEOUS) IMPLANT
BANDAGE ELASTIC 3 VELCRO ST LF (GAUZE/BANDAGES/DRESSINGS) ×2 IMPLANT
BENZOIN TINCTURE PRP APPL 2/3 (GAUZE/BANDAGES/DRESSINGS) IMPLANT
BLADE SURG 10 STRL SS (BLADE) ×4 IMPLANT
BLADE SURG 11 STRL SS (BLADE) IMPLANT
BLADE SURG 15 STRL LF DISP TIS (BLADE) ×1 IMPLANT
BLADE SURG 15 STRL SS (BLADE) ×1
BLADE SURG ROTATE 9660 (MISCELLANEOUS) IMPLANT
BNDG GAUZE ELAST 4 BULKY (GAUZE/BANDAGES/DRESSINGS) ×2 IMPLANT
CANISTER SUCT 3000ML (MISCELLANEOUS) ×2 IMPLANT
CONT SPEC 4OZ CLIKSEAL STRL BL (MISCELLANEOUS) ×2 IMPLANT
CORDS BIPOLAR (ELECTRODE) ×2 IMPLANT
DEPRESSOR TONGUE BLADE STERILE (MISCELLANEOUS) IMPLANT
DERMABOND ADVANCED (GAUZE/BANDAGES/DRESSINGS)
DERMABOND ADVANCED .7 DNX12 (GAUZE/BANDAGES/DRESSINGS) IMPLANT
DRAPE EXTREMITY T 121X128X90 (DRAPE) ×2 IMPLANT
DRAPE INCISE IOBAN 66X45 STRL (DRAPES) IMPLANT
ELECT REM PT RETURN 9FT ADLT (ELECTROSURGICAL) ×2
ELECTRODE REM PT RTRN 9FT ADLT (ELECTROSURGICAL) ×1 IMPLANT
GAUZE SPONGE 4X4 16PLY XRAY LF (GAUZE/BANDAGES/DRESSINGS) ×2 IMPLANT
GLOVE BIO SURGEON STRL SZ7 (GLOVE) ×2 IMPLANT
GLOVE BIOGEL PI IND STRL 7.0 (GLOVE) ×1 IMPLANT
GLOVE BIOGEL PI IND STRL 7.5 (GLOVE) ×1 IMPLANT
GLOVE BIOGEL PI IND STRL 8.5 (GLOVE) ×1 IMPLANT
GLOVE BIOGEL PI INDICATOR 7.0 (GLOVE) ×1
GLOVE BIOGEL PI INDICATOR 7.5 (GLOVE) ×1
GLOVE BIOGEL PI INDICATOR 8.5 (GLOVE) ×1
GLOVE ECLIPSE 8.0 STRL XLNG CF (GLOVE) ×2 IMPLANT
GLOVE EXAM NITRILE LRG STRL (GLOVE) IMPLANT
GLOVE EXAM NITRILE MD LF STRL (GLOVE) ×2 IMPLANT
GLOVE EXAM NITRILE XL STR (GLOVE) IMPLANT
GLOVE EXAM NITRILE XS STR PU (GLOVE) IMPLANT
GLOVE OPTIFIT SS 6.5 STRL BRWN (GLOVE) ×2 IMPLANT
GOWN BRE IMP SLV AUR LG STRL (GOWN DISPOSABLE) IMPLANT
GOWN BRE IMP SLV AUR XL STRL (GOWN DISPOSABLE) IMPLANT
GOWN STRL REIN 2XL LVL4 (GOWN DISPOSABLE) IMPLANT
GOWN STRL REUS W/ TWL LRG LVL3 (GOWN DISPOSABLE) ×2 IMPLANT
GOWN STRL REUS W/TWL 2XL LVL3 (GOWN DISPOSABLE) ×2 IMPLANT
GOWN STRL REUS W/TWL LRG LVL3 (GOWN DISPOSABLE) ×2
KIT BASIN OR (CUSTOM PROCEDURE TRAY) ×2 IMPLANT
KIT ROOM TURNOVER OR (KITS) ×2 IMPLANT
LOCATOR NERVE 3 VOLT (DISPOSABLE) IMPLANT
LOOP VESSEL MAXI BLUE (MISCELLANEOUS) ×2 IMPLANT
MARKER SKIN DUAL TIP RULER LAB (MISCELLANEOUS) ×2 IMPLANT
NEEDLE HYPO 25X1 1.5 SAFETY (NEEDLE) ×2 IMPLANT
NS IRRIG 1000ML POUR BTL (IV SOLUTION) ×2 IMPLANT
PACK SURGICAL SETUP 50X90 (CUSTOM PROCEDURE TRAY) ×2 IMPLANT
SPECIMEN JAR SMALL (MISCELLANEOUS) IMPLANT
SPONGE GAUZE 4X4 12PLY (GAUZE/BANDAGES/DRESSINGS) ×2 IMPLANT
STOCKINETTE 4X48 STRL (DRAPES) ×2 IMPLANT
STRIP CLOSURE SKIN 1/4X4 (GAUZE/BANDAGES/DRESSINGS) IMPLANT
SUT ETHILON 4 0 PS 2 18 (SUTURE) ×4 IMPLANT
SUT VIC AB 2-0 CP2 18 (SUTURE) ×2 IMPLANT
SUT VIC AB 3-0 SH 8-18 (SUTURE) ×2 IMPLANT
SYR BULB 3OZ (MISCELLANEOUS) ×2 IMPLANT
SYR CONTROL 10ML LL (SYRINGE) ×2 IMPLANT
SYR TB 1ML 25GX5/8 (SYRINGE) IMPLANT
TOWEL OR 17X24 6PK STRL BLUE (TOWEL DISPOSABLE) ×2 IMPLANT
TOWEL OR 17X26 10 PK STRL BLUE (TOWEL DISPOSABLE) ×2 IMPLANT
TUBE CONNECTING 12X1/4 (SUCTIONS) ×2 IMPLANT
WATER STERILE IRR 1000ML POUR (IV SOLUTION) IMPLANT

## 2013-12-24 NOTE — Procedures (Addendum)
LP Procedure Note:  Patient has been seen and examined.  Neurological examination unchanged. Chart has been reviewed.  LP is being performed to rule out a paraneoplastic syndrome .  Procedure has been explained to patient/family including risks and benefits.  Consent has been signed by patient and witnessed.   Blood pressure 184/111, pulse 100, temperature 98.1 F (36.7 C), temperature source Oral, resp. rate 18, height 5\' 5"  (1.651 m), weight 58.922 kg (129 lb 14.4 oz), last menstrual period 11/03/2013, SpO2 94.00%.  Current facility-administered medications:acetaminophen (TYLENOL) suppository 650 mg, 650 mg, Rectal, Q6H PRN, Charlynne Cousins, MD;  acetaminophen (TYLENOL) tablet 650 mg, 650 mg, Oral, Q6H PRN, Charlynne Cousins, MD;  Ampicillin-Sulbactam (UNASYN) 3 g in sodium chloride 0.9 % 100 mL IVPB, 3 g, Intravenous, Q6H, Jonetta Osgood, MD, 3 g at 12/24/13 0510 antiseptic oral rinse (BIOTENE) solution 15 mL, 15 mL, Mouth Rinse, BID, Jonetta Osgood, MD, 15 mL at 12/23/13 1939;  carvedilol (COREG) tablet 12.5 mg, 12.5 mg, Oral, BID WC, Shanker Kristeen Mans, MD;  DULoxetine (CYMBALTA) DR capsule 30 mg, 30 mg, Oral, Daily, Jonetta Osgood, MD, 30 mg at 84/13/24 4010;  folic acid (FOLVITE) tablet 1 mg, 1 mg, Oral, Daily, Jonetta Osgood, MD, 1 mg at 12/23/13 1013 furosemide (LASIX) tablet 40 mg, 40 mg, Oral, Daily, Jonetta Osgood, MD, 40 mg at 12/23/13 1012;  gabapentin (NEURONTIN) capsule 400 mg, 400 mg, Oral, TID, Jonetta Osgood, MD, 400 mg at 12/23/13 2146;  hydrALAZINE (APRESOLINE) injection 10 mg, 10 mg, Intravenous, Q4H PRN, Gardiner Barefoot, NP, 10 mg at 12/24/13 0645 HYDROcodone-acetaminophen (NORCO/VICODIN) 5-325 MG per tablet 2 tablet, 2 tablet, Oral, Q4H PRN, Charlynne Cousins, MD, 2 tablet at 12/23/13 2259;  morphine 2 MG/ML injection 1 mg, 1 mg, Intravenous, Q2H PRN, Jonetta Osgood, MD, 1 mg at 12/24/13 0709;  multivitamin with minerals tablet 1 tablet, 1 tablet,  Oral, Daily, Jonetta Osgood, MD, 1 tablet at 12/23/13 1012 ondansetron [facility]) injection 4 mg, 4 mg, Intravenous, Q6H PRN, Charlynne Cousins, MD;  ondansetron [facility]) tablet 4 mg, 4 mg, Oral, Q6H PRN, Charlynne Cousins, MD, 4 mg at 12/19/13 2725;  oxymetazoline (AFRIN) 0.05 % nasal spray 2 spray, 2 spray, Each Nare, BID PRN, Rhetta Mura Schorr, NP;  polyethylene glycol (MIRALAX / GLYCOLAX) packet 17 g, 17 g, Oral, Daily, Jonetta Osgood, MD, 17 g at 12/20/13 1115 potassium chloride SA (K-DUR,KLOR-CON) CR tablet 20 mEq, 20 mEq, Oral, Daily, Jonetta Osgood, MD, 20 mEq at 12/23/13 1013;  thiamine (VITAMIN B-1) tablet 100 mg, 100 mg, Oral, Daily, Jonetta Osgood, MD, 100 mg at 12/23/13 1012;  vancomycin (VANCOCIN) IVPB 750 mg/150 ml premix, 750 mg, Intravenous, Q12H, Jonetta Osgood, MD, 750 mg at 12/24/13 3664 vitamin B-12 (CYANOCOBALAMIN) tablet 1,000 mcg, 1,000 mcg, Oral, Daily, Jonetta Osgood, MD, 1,000 mcg at 12/23/13 1012   Recent Labs  12/23/13 0715 12/24/13 0708  WBC 19.5* 18.9*  HGB 8.0* 8.1*  HCT 23.9* 23.9*  PLT 333 332    MRI of brain (12/22/2013):No acute infarct. No intracranial mass or abnormal enhancement.    Patient was placed in the lateral decub position.  Area was cleaned with betadine and anesthetized with lidocaine.  Under sterile conditions 20G LP needle was placed at approximately L3-4 without difficulty.  Opening pressure was documented at 25.  Approximately 20cc of clear and colorless fluid was obtained and sent for studies.  No complications were noted.  Alexis Goodell, MD [facility] [phone] 12/24/2013  8:12 AM

## 2013-12-24 NOTE — Anesthesia Postprocedure Evaluation (Signed)
  Anesthesia Post-op Note  Patient: [name]  Procedure(s) Performed: Procedure(s): SURAL NERVE BIOPSY (Right)  Patient Location: PACU  Anesthesia Type:MAC  Level of Consciousness: awake, alert , oriented and patient cooperative  Airway and Oxygen Therapy: Patient Spontanous Breathing  Post-op Pain: none  Post-op Assessment: Post-op Vital signs reviewed, Patient's Cardiovascular Status Stable, Respiratory Function Stable, Patent Airway, No signs of Nausea or vomiting and Pain level controlled  Post-op Vital Signs: Reviewed and stable  Last Vitals:  Filed Vitals:   12/24/13 2050  BP: 134/69  Pulse: 89  Temp: 36.6 C  Resp: 17    Complications: No apparent anesthesia complications

## 2013-12-24 NOTE — Anesthesia Preprocedure Evaluation (Addendum)
Anesthesia Evaluation  Patient identified by MRN, date of birth, ID band Patient awake    Reviewed: Allergy & Precautions, H&P , NPO status , Patient's Chart, lab work & pertinent test results  History of Anesthesia Complications Negative for: history of anesthetic complications  Airway Mallampati: II TM Distance: >3 FB Neck ROM: Full    Dental  (+) Poor Dentition, Loose   Pulmonary neg shortness of breath, neg sleep apnea, neg COPDneg recent URI, Current Smoker,  breath sounds clear to auscultation        Cardiovascular hypertension, - angina- Past MI and - CHF - dysrhythmias Rhythm:Regular Rate:Normal  Mod PHtn, Mos TR, normal ef   Neuro/Psych Difficulty with ambulation, underwent sural nerve biopsy last night  Neuromuscular disease negative psych ROS   GI/Hepatic negative GI ROS, Neg liver ROS,   Endo/Other  negative endocrine ROS  Renal/GU negative Renal ROS     Musculoskeletal   Abdominal   Peds  Hematology  (+) anemia ,   Anesthesia Other Findings   Reproductive/Obstetrics                          Anesthesia Physical Anesthesia Plan  ASA: III  Anesthesia Plan: General   Post-op Pain Management:    Induction: Intravenous  Airway Management Planned: Nasal ETT  Additional Equipment: None  Intra-op Plan:   Post-operative Plan: Extubation in OR  Informed Consent: I have reviewed the patients History and Physical, chart, labs and discussed the procedure including the risks, benefits and alternatives for the proposed anesthesia with the patient or authorized representative who has indicated his/her understanding and acceptance.   Dental advisory given  Plan Discussed with: CRNA and Surgeon  Anesthesia Plan Comments:         Anesthesia Quick Evaluation

## 2013-12-24 NOTE — Op Note (Signed)
Date of surgery: 12/24/2013  Preoperative diagnosis: Peripheral neuropathy Postoperative diagnosis: Peripheral neuropathy Procedure: Right sural nerve biopsy Surgeon: Kristeen Miss Anesthesia: IV sedation plus local MAC Indications: Ms. [name] is a 43 year old individual who presented with febrile illness is had progressive neurologic deterioration with peripheral nerve phenomenon including weakness numbness and severe burning dysesthesias after workup by neurology was felt that him nerve biopsy should be performed.  Procedure: Patient was brought to the operating room and placed on the table in supine position. After some induction of analgesia using propofol the patient's right leg was prepped with alcohol and DuraPrep and then draped in a stockinette. The leg was flexed slightly so as to expose the right lateral portion leg in the region of the lateral malleolus. The patient had 3+ pitting edema in this region. The landmarks between the posterior border of the lateral malleolus and the Achilles tendon was marked. An incision measuring 8 cm was created in this region. This was after infiltrating with 10 cc of 1% lidocaine mixed 50-50 with half percent Marcaine and 1-100,000 epinephrine. A vertical incision was carried down through the superficial tissue and the nerve was identified. It was dissected free of some surrounding vessels and a roller tissue both proximally and distally when it is felt that an adequate length was secured after inducing some further analgesia with propofol the proximal end was cut. The distal end was then traced as far distal possible and it was cut. The nerve was then cut into pieces and sent as 2 separate specimens. He was placed in the fixative is provided in the kit for pathologic processing. The wound was inspected. Significant edema continued to ooze throughout the case hemostasis was meticulously obtained and the skin was closed with interrupted vertical mattress sutures  of 4-0 nylon. A compression dressing of a number of gauze Kerlix and then a light Ace wrap was placed over the foot and ankle. Blood loss was none.

## 2013-12-24 NOTE — Anesthesia Preprocedure Evaluation (Addendum)
Anesthesia Evaluation  Patient identified by MRN, date of birth, ID band Patient awake    Reviewed: Allergy & Precautions, H&P , NPO status , Patient's Chart, lab work & pertinent test results  History of Anesthesia Complications Negative for: history of anesthetic complications  Airway Mallampati: II TM Distance: >3 FB Neck ROM: Full    Dental  (+) Poor Dentition, Loose, Chipped, Dental Advisory Given, Missing   Pulmonary Current Smoker,  breath sounds clear to auscultation  Pulmonary exam normal       Cardiovascular negative cardio ROS  Rhythm:Regular Rate:Normal     Neuro/Psych Ascending lower extremity neuropathy: undiagnosed, narcotic dependent    GI/Hepatic negative GI ROS, (+)     substance abuse (heavy alcohol use on weeends)  alcohol use, LFTs elevated   Endo/Other  negative endocrine ROS  Renal/GU negative Renal ROS     Musculoskeletal   Abdominal   Peds  Hematology  (+) Blood dyscrasia (Hb 8.1), anemia ,   Anesthesia Other Findings   Reproductive/Obstetrics S/p BTL                         Anesthesia Physical Anesthesia Plan  ASA: III  Anesthesia Plan: MAC   Post-op Pain Management:    Induction:   Airway Management Planned: Natural Airway and Simple Face Mask  Additional Equipment:   Intra-op Plan:   Post-operative Plan:   Informed Consent: I have reviewed the patients History and Physical, chart, labs and discussed the procedure including the risks, benefits and alternatives for the proposed anesthesia with the patient or authorized representative who has indicated his/her understanding and acceptance.   Dental advisory given  Plan Discussed with: CRNA and Surgeon  Anesthesia Plan Comments: (Plan routine monitors, MAC)        Anesthesia Quick Evaluation

## 2013-12-24 NOTE — Progress Notes (Signed)
Patient is back on floor. Awake and alert. Will continue to monitor

## 2013-12-24 NOTE — Transfer of Care (Signed)
Immediate Anesthesia Transfer of Care Note  Patient: [name]  Procedure(s) Performed: Procedure(s): SURAL NERVE BIOPSY (Right)  Patient Location: PACU  Anesthesia Type:MAC  Level of Consciousness: awake, alert  and oriented  Airway & Oxygen Therapy: Patient Spontanous Breathing and Patient connected to nasal cannula oxygen  Post-op Assessment: Report given to PACU RN, Post -op Vital signs reviewed and stable and Patient moving all extremities X 4  Post vital signs: Reviewed and stable  Complications: No apparent anesthesia complications

## 2013-12-24 NOTE — Progress Notes (Signed)
Patient ID: [name], female   DOB: 05-01-1971, 43 y.o.   MRN: [id number] Patient underwent lumbar puncture today. At this time after discussion with Dr. Doy Mince it is still advised that we should obtain a sural nerve biopsy. This will be done from the right sternal nerve. I discussed the procedure with the patient's mother and patient plan would be to do this this afternoon. She'll now be made n.p.o.

## 2013-12-24 NOTE — Progress Notes (Signed)
PATIENT DETAILS Name: [name] Age: 43 y.o. Sex: female Date of Birth: Jan 26, 1971 Admit Date: 12/18/2013 Admitting Physician Charlynne Cousins, MD PCP:No PCP Per Patient  Brief summary: Patient is a 43 year old female with a history of alcohol use who presented to the hospital with right facial swelling and cellulitis. A CT of the face on admission was negative for abscess, she was then admitted for further evaluation and treatment, and started on empiric antibiotic therapy. She also has a 2 month history of lower extremity weakness, paresthesias and significant hyperesthesia. Hospital course has been uneventful, cellulitis of the face is almost resolved, she is currently undergoing elaborate workup at the discretion of neurology for lower extremity neuropathy.  Subjective: Left facial swelling continues to decrease  Assessment/Plan: Active Problems:   Facial cellulitis- Right - Admitted, and started on vancomycin on 4/8, since foci is likely dental caries, added Unasyn for anaerobic coverage on 4/9-continue. Clinically much improved, is afebrile, leukocytosis persistent but stable. Significantly decreased in the right sided swelling and erythema, now has to small indurated areas in the right upper lip, that is intermittently draining purulent material. Curbsided ENT-Dr Janace Hoard on 4/11, who quickly eyeballed the patient, and suggested that we c/w Abx, he did not think a I&D was warranted at this time.CT of the facial region done on admission was negative for abscess. Continue with IV vancomycin and Unasyn.  -  Dentistry on board, for teeth extraction on 4/15.  Systemic inflammatory response syndrome - Secondary to above, treated with antibiotics.  Bilateral lower extremity neuropathy - Neurology consulted, workup in progress.Etiology unknown, MRI of the cervical/thoracic spine negative for acute abnormalities, ANA negative.  RPR nonreactive, HIV nonreactive, ESR only at 5,  rheumatoid factor negative. Vitamin B12 at 381.Heavy metal screen, SPEP pending - Increased Neurontin to $RemoveBefo'400mg'SXONNLmDjku$  TID and started Cymbalta, will need to minimize/taper narcotics slowly - PT evaluation done-CIR recommended-recommended HHPT, but suspect will need SNF on discharge  -Neuro now recommending Sural Nerve Bx-have consulted neurosurgery  - LP done by neurology on 4/14, pending results  Hyponatremia - Likely secondary to dehydration, resolved with IV fluids.  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 Claims to drink on weekends and one or twice in the weekdays  Anemia -Anemia panel consistent with Anemia of Chronic disease,likely worsened by acute illness -No indication for transfusion at this time -continue to monitor Hb periodically  B/L lower extremity swelling -?secondary to hypoalbuminemia-has hx of ETOH use-but LFT's not that deranged. UA negative for Proteinuria, will check Liver Ultrasound and Echo. Will also get a doppler ultrasound of lower legs. Could be from the neuropathy as well. -start lasix, with K.  Hypertension - Likely worsened by peripheral neuropathy/pain - Continue Coreg, increased to 25 mg twice a day. Continue Lasix. We'll continue to monitor and optimize antihypertensive regimen.  Disposition: Remain inpatient  DVT Prophylaxis: Prophylactic Heparin  Code Status: Full code   Family Communication Mother at bedside  Procedures:   lumbar puncture- 4/14  CONSULTS:  neurology  Neurosurgery  Dentistry  MEDICATIONS: Scheduled Meds: . ampicillin-sulbactam (UNASYN) IV  3 g Intravenous Q6H  . antiseptic oral rinse  15 mL Mouth Rinse BID  . carvedilol  12.5 mg Oral BID WC  . DULoxetine  30 mg Oral Daily  . folic acid  1 mg Oral Daily  . furosemide  40 mg Oral Daily  . gabapentin  400 mg  Oral TID  . multivitamin with minerals  1 tablet Oral Daily  . polyethylene glycol  17 g Oral Daily    . potassium chloride  20 mEq Oral Daily  . thiamine  100 mg Oral Daily  . vancomycin  750 mg Intravenous Q12H  . vitamin B-12  1,000 mcg Oral Daily   Continuous Infusions:   PRN Meds:.acetaminophen, acetaminophen, hydrALAZINE, HYDROcodone-acetaminophen, morphine injection, ondansetron (ZOFRAN) IV, ondansetron, oxymetazoline  Antibiotics: Anti-infectives   Start     Dose/Rate Route Frequency Ordered Stop   12/23/13 1900  vancomycin (VANCOCIN) IVPB 750 mg/150 ml premix     750 mg 150 mL/hr over 60 Minutes Intravenous Every 12 hours 12/23/13 1543     12/22/13 1930  vancomycin (VANCOCIN) IVPB 750 mg/150 ml premix  Status:  Discontinued     750 mg 150 mL/hr over 60 Minutes Intravenous Every 12 hours 12/22/13 1856 12/23/13 0909   12/19/13 0930  Ampicillin-Sulbactam (UNASYN) 3 g in sodium chloride 0.9 % 100 mL IVPB     3 g 100 mL/hr over 60 Minutes Intravenous Every 6 hours 12/19/13 0826     12/19/13 0600  vancomycin (VANCOCIN) IVPB 750 mg/150 ml premix  Status:  Discontinued     750 mg 150 mL/hr over 60 Minutes Intravenous Every 12 hours 12/18/13 1808 12/22/13 1856   12/18/13 1815  vancomycin (VANCOCIN) IVPB 1000 mg/200 mL premix     1,000 mg 200 mL/hr over 60 Minutes Intravenous NOW 12/18/13 1807 12/18/13 2006   12/18/13 1545  clindamycin (CLEOCIN) IVPB 600 mg     600 mg 100 mL/hr over 30 Minutes Intravenous  Once 12/18/13 1539 12/18/13 1632       PHYSICAL EXAM: Vital signs in last 24 hours: Filed Vitals:   12/23/13 1938 12/24/13 0458 12/24/13 0552 12/24/13 0708  BP: 150/91  184/111 140/92  Pulse: 79  100   Temp: 98.1 F (36.7 C) 98.1 F (36.7 C)    TempSrc: Oral Oral    Resp: 18 18    Height:      Weight:  58.922 kg (129 lb 14.4 oz)    SpO2: 95% 94%      Weight change: 0 kg (0 lb) Filed Weights   12/18/13 2016 12/23/13 0452 12/24/13 0458  Weight: 57.743 kg (127 lb 4.8 oz) 58.922 kg (129 lb 14.4 oz) 58.922 kg (129 lb 14.4 oz)   Body mass index is 21.62 kg/(m^2).    Gen Exam: Awake and alert with clear speech. Right facial swelling significantly decreased, 2 small indurated areas in the right upper lip-not draining this am, +dental carries Neck: Supple, No JVD.   Chest: B/L Clear.   CVS: S1 S2 Regular, no murmurs.  Abdomen: soft, BS +, non tender, non distended.  Extremities: + edema, lower extremities warm to touch. Neurologic: Non Focal.   Skin: No Rash.  Wounds: N/A.    Intake/Output from previous day:  Intake/Output Summary (Last 24 hours) at 12/24/13 1033 Last data filed at 12/23/13 1600  Gross per 24 hour  Intake    766 ml  Output      0 ml  Net    766 ml     LAB RESULTS: CBC  Recent Labs Lab 12/18/13 1540  12/20/13 0653 12/21/13 0750 12/22/13 0650 12/23/13 0715 12/24/13 0708  WBC 24.2*  < > 15.1* 13.7* 16.7* 19.5* 18.9*  HGB 10.2*  < > 8.5* 8.1* 7.8* 8.0* 8.1*  HCT 28.7*  < > 24.9* 24.1* 23.7* 23.9* 23.9*  PLT 397  < > 309 304 309 333 332  MCV 98.6  < > 100.8* 101.3* 102.2* 102.1* 102.1*  MCH 35.1*  < > 34.4* 34.0 33.6 34.2* 34.6*  MCHC 35.5  < > 34.1 33.6 32.9 33.5 33.9  RDW 15.4  < > 15.6* 15.6* 15.4 15.8* 16.4*  LYMPHSABS 2.8  --   --   --   --   --   --   MONOABS 2.1*  --   --   --   --   --   --   EOSABS 0.1  --   --   --   --   --   --   BASOSABS 0.1  --   --   --   --   --   --   < > = values in this interval not displayed.  Chemistries   Recent Labs Lab 12/20/13 0653 12/20/13 1238 12/21/13 0750 12/22/13 0650 12/23/13 0715 12/24/13 0708  NA 136*  --  135* 139 139 138  K 3.2*  --  3.2* 3.3* 3.5* 3.7  CL 95*  --  95* 100 97 97  CO2 26  --  $R'27 26 25 25  'Bb$ GLUCOSE 93  --  89 100* 93 108*  BUN 5*  --  3* <3* <3* 3*  CREATININE 0.54  --  0.45* 0.43* 0.47* 0.47*  CALCIUM 7.9*  --  8.1* 8.3* 8.4 8.4  MG  --  1.6  --   --   --   --     CBG: No results found for this basename: GLUCAP,  in the last 168 hours  GFR Estimated Creatinine Clearance: 82.4 ml/min (by C-G formula based on Cr of  0.47).  Coagulation profile  Recent Labs Lab 12/19/13 0530  INR 1.06    Cardiac Enzymes No results found for this basename: CK, CKMB, TROPONINI, MYOGLOBIN,  in the last 168 hours  No components found with this basename: POCBNP,  No results found for this basename: DDIMER,  in the last 72 hours No results found for this basename: HGBA1C,  in the last 72 hours No results found for this basename: CHOL, HDL, LDLCALC, TRIG, CHOLHDL, LDLDIRECT,  in the last 72 hours No results found for this basename: TSH, T4TOTAL, FREET3, T3FREE, THYROIDAB,  in the last 72 hours No results found for this basename: VITAMINB12, FOLATE, FERRITIN, TIBC, IRON, RETICCTPCT,  in the last 72 hours No results found for this basename: LIPASE, AMYLASE,  in the last 72 hours  Urine Studies No results found for this basename: UACOL, UAPR, USPG, UPH, UTP, UGL, UKET, UBIL, UHGB, UNIT, UROB, ULEU, UEPI, UWBC, URBC, UBAC, CAST, CRYS, UCOM, BILUA,  in the last 72 hours  MICROBIOLOGY: Recent Results (from the past 240 hour(s))  CULTURE, BLOOD (ROUTINE X 2)     Status: None   Collection Time    12/18/13  7:48 PM      Result Value Ref Range Status   Specimen Description BLOOD ARM LEFT   Final   Special Requests BOTTLES DRAWN AEROBIC AND ANAEROBIC 10CC   Final   Culture  Setup Time     Final   Value: 12/19/2013 00:57     Performed at [facility]   Culture     Final   Value:        BLOOD CULTURE RECEIVED NO GROWTH TO DATE CULTURE WILL BE HELD FOR 5 DAYS BEFORE ISSUING A FINAL NEGATIVE REPORT  Performed at [facility]   Report Status PENDING   Incomplete  CULTURE, BLOOD (ROUTINE X 2)     Status: None   Collection Time    12/18/13  7:53 PM      Result Value Ref Range Status   Specimen Description BLOOD HAND LEFT   Final   Special Requests BOTTLES DRAWN AEROBIC ONLY 10CC   Final   Culture  Setup Time     Final   Value: 12/19/2013 00:58     Performed at [facility]   Culture     Final    Value:        BLOOD CULTURE RECEIVED NO GROWTH TO DATE CULTURE WILL BE HELD FOR 5 DAYS BEFORE ISSUING A FINAL NEGATIVE REPORT     Performed at [facility]   Report Status PENDING   Incomplete  GRAM STAIN     Status: None   Collection Time    12/24/13  8:23 AM      Result Value Ref Range Status   Specimen Description CSF   Final   Special Requests CSF FLUID NO 4 8CC   Final   Gram Stain     Final   Value: CSF CYTOSPIN     NO WBC SEEN     NO ORGANISMS SEEN   Report Status 12/24/2013 FINAL   Final    RADIOLOGY STUDIES/RESULTS: Dg Orthopantogram  12/19/2013   CLINICAL DATA:  Facial cellulitis  EXAM: ORTHOPANTOGRAM/PANORAMIC  COMPARISON:  CT 12/18/2013  FINDINGS: Multiple caries especially right lower molars and left upper third molar.  Periapical lucency around two right lower molars consistent with infection. This is noted on the CT.  Negative for fracture or mass.  IMPRESSION: Dental caries.  Periapical lucency around two right lower molars consistent with infection.   Electronically Signed   By: Franchot Gallo M.D.   On: 12/19/2013 09:09   Dg Chest 2 View  11/19/2013   CLINICAL DATA Chest pain.  EXAM CHEST  2 VIEW  COMPARISON None.  FINDINGS No cardiomegaly. Unremarkable mediastinal contours. No infiltrate, edema, effusion, or pneumothorax. There is an apparent 22 mm long lucency in the posterior right fifth rib. This could be related to a true expansile bone lesion or summation of lung markings.  IMPRESSION 1. No active cardiopulmonary disease. 2. Questionable expansile bone lesion in the posterior right fifth rib. Recommend dedicated rib series for confirmation (would like to exclude CT radiation due to young age).  SIGNATURE  Electronically Signed   By: Jorje Guild M.D.   On: 11/19/2013 13:27   Dg Ribs Unilateral Right  11/19/2013   CLINICAL DATA Lower rib pain, fall 3 weeks ago  EXAM RIGHT RIBS - 2 VIEW  COMPARISON 11/19/2013  FINDINGS Radiopaque marker over the right lower  ribs. No displaced fracture or definite focal rib abnormality. Inferior right ribs appear intact. Right lung clear. No effusion or pneumothorax. No chest wall asymmetry or subcutaneous emphysema.  IMPRESSION No acute osseous finding  SIGNATURE  Electronically Signed   By: Daryll Brod M.D.   On: 11/19/2013 16:57   Ct Maxillofacial W/cm  12/18/2013   CLINICAL DATA:  Right-sided facial pain and swelling.  EXAM: CT MAXILLOFACIAL WITH CONTRAST  TECHNIQUE: Multidetector CT imaging of the maxillofacial structures was performed with intravenous contrast. Multiplanar CT image reconstructions were also generated. A small metallic BB was placed on the right temple in order to reliably differentiate right from left.  CONTRAST:  74mL OMNIPAQUE IOHEXOL  300 MG/ML  SOLN  COMPARISON:  None.  FINDINGS: There is a focal area of subcutaneous soft tissue swelling/ edema involving the right facial area overlying the right mandible and maxilla. No discrete drainable soft tissue abscess.  There is extensive dental disease with dental caries and right-sided periapical lucency involving the mandibular molars. No abnormality in the floor of the mouth. The tongue base is normal.  The parotid and submandibular glands are normal. The epiglottis is normal. The paraglottic fat planes are maintained. The piriform sinus and vallecular air spaces are normal.  Small scattered bilateral neck nodes but no mass or adenopathy.  The visualized portion of the brain is unremarkable. The paranasal sinuses and mastoid air cells are clear. Globes are intact.  IMPRESSION: Right facial cellulitis without discrete abscess. This is due to periapical abscess involving the right mandibular molar.   Electronically Signed   By: Kalman Jewels M.D.   On: 12/18/2013 18:03    Yechezkel Fertig Kristeen Mans, MD  [facility] Pager:[phone]  If 7PM-7AM, please contact night-coverage [email or web address] Password [facility] 12/24/2013, 10:33 AM   LOS: 6 days

## 2013-12-24 NOTE — Progress Notes (Signed)
12/24/2013  Patient:            [name] Date of Birth:  07/06/1971 MRN:                [id number]  BP 140/92  Pulse 100  Temp(Src) 98.1 F (36.7 C) (Oral)  Resp 18  Ht 5\' 5"  (1.651 m)  Wt 129 lb 14.4 oz (58.922 kg)  BMI 21.62 kg/m2  SpO2 94%  LMP 11/03/2013  Lab Results  Component Value Date   WBC 18.9* 12/24/2013   HGB 8.1* 12/24/2013   HCT 23.9* 12/24/2013   MCV 102.1* 12/24/2013   PLT 332 12/24/2013   BMET    Component Value Date/Time   NA 138 12/24/2013 0708   K 3.7 12/24/2013 0708   CL 97 12/24/2013 0708   CO2 25 12/24/2013 0708   GLUCOSE 108* 12/24/2013 0708   BUN 3* 12/24/2013 0708   CREATININE 0.47* 12/24/2013 0708   CALCIUM 8.4 12/24/2013 0708   GFRNONAA >90 12/24/2013 0708   GFRAA >90 12/24/2013 0708    [name] is a 43 year old female with history of right facial swelling. Patient with poor dentition and multiple sources of dental infection. Patient was provided multiple treatment options yesterday. I have returned this morning to discuss the risks, benefits, competitions of the treatment options again the patient and her mother.   SUBJECTIVE: Patient denies having any significant dental discomfort at this time. Patient denies having any acute dental changes.  Exam: Dental condition as before. The right facial swelling has further resolved. The areas of dermatological lesions involving the right upper lip and right cheek have resolved significantly since yesterday.   I again discussed the potential treatment options of total or subtotal extractions with alveoloplasty and pre-prosthetic surgery as indicated with general anesthesia. I discussed potential complications for infection, swelling, bleeding, bruising, sinus involvement, root tip fracture, mandible fracture, nerve damage, and other complications related to anesthesia. We also discussed the potential complications not necessarily mentioned above.  The patient currently wished to proceed with extraction of all  remaining teeth with alveoloplasty and pre-prosthetic surgery as indicated the operating or general anesthesia. The patient will then followup with a dentist of her choice for fabrication of upper and lower complete dentures after adequate healing.  Plan: The patient is currently scheduled for her operating room procedure tomorrow at 8:30 AM in the operating room at [facility]  Lenn Cal, DDS

## 2013-12-25 ENCOUNTER — Inpatient Hospital Stay (HOSPITAL_COMMUNITY): Payer: Medicaid Other | Admitting: Anesthesiology

## 2013-12-25 ENCOUNTER — Encounter (HOSPITAL_COMMUNITY)
Admission: EM | Disposition: A | Discharge status: Skilled Nursing Facility | Payer: Self-pay | Source: Home / Self Care | Attending: Internal Medicine

## 2013-12-25 ENCOUNTER — Encounter (HOSPITAL_COMMUNITY): Payer: Medicaid Other | Admitting: Anesthesiology

## 2013-12-25 ENCOUNTER — Encounter (HOSPITAL_COMMUNITY): Payer: Self-pay | Admitting: Neurological Surgery

## 2013-12-25 DIAGNOSIS — K0530 Chronic periodontitis, unspecified: Secondary | ICD-10-CM | POA: Diagnosis present

## 2013-12-25 DIAGNOSIS — E860 Dehydration: Secondary | ICD-10-CM

## 2013-12-25 DIAGNOSIS — M270 Developmental disorders of jaws: Secondary | ICD-10-CM | POA: Diagnosis present

## 2013-12-25 DIAGNOSIS — K029 Dental caries, unspecified: Secondary | ICD-10-CM | POA: Diagnosis present

## 2013-12-25 DIAGNOSIS — K083 Retained dental root: Secondary | ICD-10-CM | POA: Diagnosis present

## 2013-12-25 DIAGNOSIS — K045 Chronic apical periodontitis: Secondary | ICD-10-CM | POA: Diagnosis present

## 2013-12-25 DIAGNOSIS — K0401 Reversible pulpitis: Secondary | ICD-10-CM | POA: Diagnosis present

## 2013-12-25 HISTORY — PX: MULTIPLE EXTRACTIONS WITH ALVEOLOPLASTY: SHX5342

## 2013-12-25 LAB — CULTURE, BLOOD (ROUTINE X 2)
Culture: NO GROWTH
Culture: NO GROWTH

## 2013-12-25 LAB — CBC
HCT: 26 % — ABNORMAL LOW (ref 36.0–46.0)
Hemoglobin: 8.4 g/dL — ABNORMAL LOW (ref 12.0–15.0)
MCH: 33.5 pg (ref 26.0–34.0)
MCHC: 32.3 g/dL (ref 30.0–36.0)
MCV: 103.6 fL — ABNORMAL HIGH (ref 78.0–100.0)
Platelets: 379 10*3/uL (ref 150–400)
RBC: 2.51 MIL/uL — ABNORMAL LOW (ref 3.87–5.11)
RDW: 17 % — ABNORMAL HIGH (ref 11.5–15.5)
WBC: 17.5 10*3/uL — ABNORMAL HIGH (ref 4.0–10.5)

## 2013-12-25 LAB — FUNGAL STAIN: Fungal Smear: NONE SEEN

## 2013-12-25 LAB — BASIC METABOLIC PANEL
BUN: 3 mg/dL — ABNORMAL LOW (ref 6–23)
CO2: 27 mEq/L (ref 19–32)
Calcium: 8.8 mg/dL (ref 8.4–10.5)
Chloride: 98 mEq/L (ref 96–112)
Creatinine, Ser: 0.46 mg/dL — ABNORMAL LOW (ref 0.50–1.10)
GFR calc Af Amer: >90 mL/min (ref >90)
GFR calc non Af Amer: >90 mL/min (ref >90)
Glucose, Bld: 87 mg/dL (ref 70–99)
Potassium: 3.6 mEq/L — ABNORMAL LOW (ref 3.7–5.3)
Sodium: 139 mEq/L (ref 137–147)

## 2013-12-25 LAB — VANCOMYCIN, TROUGH: Vancomycin Tr: 13.7 ug/mL (ref 10.0–20.0)

## 2013-12-25 SURGERY — MULTIPLE EXTRACTION WITH ALVEOLOPLASTY
Anesthesia: General | Site: Mouth

## 2013-12-25 MED ORDER — NEOSTIGMINE METHYLSULFATE 1 MG/ML IJ SOLN
INTRAMUSCULAR | Status: DC | PRN
  Administered 2013-12-25: 4 mg via INTRAVENOUS

## 2013-12-25 MED ORDER — ONDANSETRON HCL 4 MG/2ML IJ SOLN
INTRAMUSCULAR | Status: AC
  Filled 2013-12-25: qty 2

## 2013-12-25 MED ORDER — ROCURONIUM BROMIDE 50 MG/5ML IV SOLN
INTRAVENOUS | Status: AC
  Filled 2013-12-25: qty 1

## 2013-12-25 MED ORDER — EPHEDRINE SULFATE 50 MG/ML IJ SOLN
INTRAMUSCULAR | Status: AC
  Filled 2013-12-25: qty 1

## 2013-12-25 MED ORDER — BUPIVACAINE-EPINEPHRINE (PF) 0.5% -1:200000 IJ SOLN
INTRAMUSCULAR | Status: AC
  Filled 2013-12-25: qty 3.6

## 2013-12-25 MED ORDER — PHENYLEPHRINE 40 MCG/ML (10ML) SYRINGE FOR IV PUSH (FOR BLOOD PRESSURE SUPPORT)
PREFILLED_SYRINGE | INTRAVENOUS | Status: AC
  Filled 2013-12-25: qty 10

## 2013-12-25 MED ORDER — POTASSIUM CHLORIDE CRYS ER 20 MEQ PO TBCR: 60.0000 meq | EXTENDED_RELEASE_TABLET | Freq: Once | ORAL | Status: DC

## 2013-12-25 MED ORDER — HYDROMORPHONE HCL PF 1 MG/ML IJ SOLN
0.2500 mg | INTRAMUSCULAR | Status: DC | PRN
  Administered 2013-12-25: 0.5 mg via INTRAVENOUS

## 2013-12-25 MED ORDER — DEXAMETHASONE SODIUM PHOSPHATE 4 MG/ML IJ SOLN
INTRAMUSCULAR | Status: DC | PRN
  Administered 2013-12-25: 8 mg via INTRAVENOUS

## 2013-12-25 MED ORDER — FENTANYL CITRATE 0.05 MG/ML IJ SOLN
INTRAMUSCULAR | Status: AC
  Filled 2013-12-25: qty 5

## 2013-12-25 MED ORDER — LABETALOL HCL 5 MG/ML IV SOLN
INTRAVENOUS | Status: AC
  Administered 2013-12-25: 10 mg
  Filled 2013-12-25: qty 4

## 2013-12-25 MED ORDER — BUPIVACAINE-EPINEPHRINE 0.5% -1:200000 IJ SOLN
INTRAMUSCULAR | Status: DC | PRN
  Administered 2013-12-25: 3.6 mL

## 2013-12-25 MED ORDER — NEOSTIGMINE METHYLSULFATE 1 MG/ML IJ SOLN
INTRAMUSCULAR | Status: AC
  Filled 2013-12-25: qty 10

## 2013-12-25 MED ORDER — LIDOCAINE-EPINEPHRINE 2 %-1:100000 IJ SOLN
INTRAMUSCULAR | Status: DC | PRN
  Administered 2013-12-25: 6.8 mL via INTRADERMAL

## 2013-12-25 MED ORDER — STERILE WATER FOR IRRIGATION IR SOLN
Status: DC | PRN
  Administered 2013-12-25: 1000 mL

## 2013-12-25 MED ORDER — HEMOSTATIC AGENTS (NO CHARGE) OPTIME
TOPICAL | Status: DC | PRN
  Administered 2013-12-25: 1 via TOPICAL

## 2013-12-25 MED ORDER — 0.9 % SODIUM CHLORIDE (POUR BTL) OPTIME
TOPICAL | Status: DC | PRN
  Administered 2013-12-25: 1000 mL

## 2013-12-25 MED ORDER — PROPOFOL 10 MG/ML IV BOLUS
INTRAVENOUS | Status: DC | PRN
  Administered 2013-12-25: 60 mg via INTRAVENOUS
  Administered 2013-12-25: 40 mg via INTRAVENOUS

## 2013-12-25 MED ORDER — EPHEDRINE SULFATE 50 MG/ML IJ SOLN
INTRAMUSCULAR | Status: DC | PRN
  Administered 2013-12-25: 10 mg via INTRAVENOUS

## 2013-12-25 MED ORDER — PHENYLEPHRINE HCL 10 MG/ML IJ SOLN
INTRAMUSCULAR | Status: DC | PRN
  Administered 2013-12-25 (×3): 80 ug via INTRAVENOUS
  Administered 2013-12-25 (×2): 120 ug via INTRAVENOUS
  Administered 2013-12-25: 80 ug via INTRAVENOUS
  Administered 2013-12-25: 120 ug via INTRAVENOUS
  Administered 2013-12-25: 80 ug via INTRAVENOUS

## 2013-12-25 MED ORDER — SODIUM CHLORIDE 0.9 % IR SOLN
200.0000 mL | Status: DC
  Administered 2013-12-26: 200 mL

## 2013-12-25 MED ORDER — LIDOCAINE HCL (CARDIAC) 20 MG/ML IV SOLN
INTRAVENOUS | Status: AC
  Filled 2013-12-25: qty 5

## 2013-12-25 MED ORDER — LIDOCAINE-EPINEPHRINE 2 %-1:100000 IJ SOLN
INTRAMUSCULAR | Status: AC
  Filled 2013-12-25: qty 10.2

## 2013-12-25 MED ORDER — PROPOFOL 10 MG/ML IV BOLUS
INTRAVENOUS | Status: AC
  Filled 2013-12-25: qty 20

## 2013-12-25 MED ORDER — ISOPROPYL ALCOHOL 70 % SOLN
Status: DC | PRN
  Administered 2013-12-25: 1 via TOPICAL

## 2013-12-25 MED ORDER — BOOST / RESOURCE BREEZE PO LIQD
1.0000 | Freq: Two times a day (BID) | ORAL | Status: DC
  Administered 2013-12-26 – 2013-12-27 (×4): 1 via ORAL

## 2013-12-25 MED ORDER — GLYCOPYRROLATE 0.2 MG/ML IJ SOLN
INTRAMUSCULAR | Status: AC
  Filled 2013-12-25: qty 3

## 2013-12-25 MED ORDER — SODIUM CHLORIDE 0.9 % IJ SOLN
INTRAMUSCULAR | Status: AC
  Filled 2013-12-25: qty 10

## 2013-12-25 MED ORDER — POTASSIUM CHLORIDE 20 MEQ/15ML (10%) PO LIQD
60.0000 meq | Freq: Once | ORAL | Status: AC
  Administered 2013-12-25: 60 meq via ORAL
  Filled 2013-12-25: qty 45

## 2013-12-25 MED ORDER — GLYCOPYRROLATE 0.2 MG/ML IJ SOLN
INTRAMUSCULAR | Status: DC | PRN
  Administered 2013-12-25: 0.6 mg via INTRAVENOUS

## 2013-12-25 MED ORDER — PROMETHAZINE HCL 25 MG/ML IJ SOLN: 6.2500 mg | INTRAMUSCULAR | Status: DC | PRN

## 2013-12-25 MED ORDER — SODIUM CHLORIDE 0.9 % IV SOLN
INTRAVENOUS | Status: DC
  Administered 2013-12-26: 1000 mL via INTRAVENOUS

## 2013-12-25 MED ORDER — MIDAZOLAM HCL 2 MG/2ML IJ SOLN
INTRAMUSCULAR | Status: AC
  Filled 2013-12-25: qty 2

## 2013-12-25 MED ORDER — HYDROMORPHONE HCL PF 1 MG/ML IJ SOLN
INTRAMUSCULAR | Status: AC
  Administered 2013-12-25: 0.25 mg
  Filled 2013-12-25: qty 1

## 2013-12-25 MED ORDER — ONDANSETRON HCL 4 MG/2ML IJ SOLN
INTRAMUSCULAR | Status: DC | PRN
  Administered 2013-12-25: 4 mg via INTRAVENOUS

## 2013-12-25 MED ORDER — LIDOCAINE HCL (CARDIAC) 20 MG/ML IV SOLN
INTRAVENOUS | Status: DC | PRN
  Administered 2013-12-25: 60 mg via INTRAVENOUS

## 2013-12-25 MED ORDER — ROCURONIUM BROMIDE 100 MG/10ML IV SOLN
INTRAVENOUS | Status: DC | PRN
  Administered 2013-12-25: 30 mg via INTRAVENOUS

## 2013-12-25 MED ORDER — FENTANYL CITRATE 0.05 MG/ML IJ SOLN
INTRAMUSCULAR | Status: DC | PRN
  Administered 2013-12-25: 100 ug via INTRAVENOUS
  Administered 2013-12-25: 150 ug via INTRAVENOUS
  Administered 2013-12-25: 100 ug via INTRAVENOUS

## 2013-12-25 MED ORDER — DEXAMETHASONE SODIUM PHOSPHATE 4 MG/ML IJ SOLN
INTRAMUSCULAR | Status: AC
  Filled 2013-12-25: qty 2

## 2013-12-25 MED ORDER — LABETALOL HCL 5 MG/ML IV SOLN
5.0000 mg | INTRAVENOUS | Status: DC | PRN
  Administered 2013-12-25: 5 mg via INTRAVENOUS

## 2013-12-25 MED ORDER — OXYMETAZOLINE HCL 0.05 % NA SOLN
NASAL | Status: DC | PRN
  Administered 2013-12-25: 4 via NASAL

## 2013-12-25 SURGICAL SUPPLY — 35 items
ALCOHOL 70% 16 OZ (MISCELLANEOUS) ×2 IMPLANT
ATTRACTOMAT 16X20 MAGNETIC DRP (DRAPES) ×2 IMPLANT
BLADE SURG 15 STRL LF DISP TIS (BLADE) ×2 IMPLANT
BLADE SURG 15 STRL SS (BLADE) ×2
COVER SURGICAL LIGHT HANDLE (MISCELLANEOUS) ×2 IMPLANT
CRADLE DONUT ADULT HEAD (MISCELLANEOUS) IMPLANT
GAUZE PACKING FOLDED 2  STR (GAUZE/BANDAGES/DRESSINGS) ×1
GAUZE PACKING FOLDED 2 STR (GAUZE/BANDAGES/DRESSINGS) ×1 IMPLANT
GAUZE SPONGE 4X4 16PLY XRAY LF (GAUZE/BANDAGES/DRESSINGS) ×4 IMPLANT
GLOVE BIOGEL PI IND STRL 6 (GLOVE) ×1 IMPLANT
GLOVE BIOGEL PI INDICATOR 6 (GLOVE) ×1
GLOVE SURG ORTHO 8.0 STRL STRW (GLOVE) ×2 IMPLANT
GLOVE SURG SS PI 6.0 STRL IVOR (GLOVE) ×2 IMPLANT
GOWN STRL REUS W/ TWL LRG LVL3 (GOWN DISPOSABLE) ×1 IMPLANT
GOWN STRL REUS W/TWL 2XL LVL3 (GOWN DISPOSABLE) ×2 IMPLANT
GOWN STRL REUS W/TWL LRG LVL3 (GOWN DISPOSABLE) ×1
HEMOSTAT SURGICEL .5X2 ABSORB (HEMOSTASIS) IMPLANT
KIT BASIN OR (CUSTOM PROCEDURE TRAY) ×2 IMPLANT
KIT ROOM TURNOVER OR (KITS) ×2 IMPLANT
MANIFOLD NEPTUNE WASTE (CANNULA) ×2 IMPLANT
NEEDLE BLUNT 16X1.5 OR ONLY (NEEDLE) ×2 IMPLANT
NEEDLE DENTAL 27 LONG (NEEDLE) ×4 IMPLANT
NS IRRIG 1000ML POUR BTL (IV SOLUTION) ×2 IMPLANT
PACK EENT II TURBAN DRAPE (CUSTOM PROCEDURE TRAY) ×2 IMPLANT
PAD ARMBOARD 7.5X6 YLW CONV (MISCELLANEOUS) ×2 IMPLANT
SPONGE SURGIFOAM ABS GEL 100 (HEMOSTASIS) ×2 IMPLANT
SPONGE SURGIFOAM ABS GEL 12-7 (HEMOSTASIS) IMPLANT
SPONGE SURGIFOAM ABS GEL SZ50 (HEMOSTASIS) IMPLANT
SUCTION FRAZIER TIP 10 FR DISP (SUCTIONS) ×2 IMPLANT
SUT CHROMIC 3 0 PS 2 (SUTURE) ×4 IMPLANT
SUT CHROMIC 4 0 P 3 18 (SUTURE) ×4 IMPLANT
SYR 50ML SLIP (SYRINGE) ×2 IMPLANT
TOWEL OR 17X26 10 PK STRL BLUE (TOWEL DISPOSABLE) ×2 IMPLANT
TUBE CONNECTING 12X1/4 (SUCTIONS) ×2 IMPLANT
YANKAUER SUCT BULB TIP NO VENT (SUCTIONS) ×2 IMPLANT

## 2013-12-25 NOTE — Anesthesia Procedure Notes (Signed)
Procedure Name: Intubation Date/Time: 12/25/2013 8:48 AM Performed by: Amier Hoyt, Sheron Nightingale Pre-anesthesia Checklist: Patient identified, Timeout performed, Emergency Drugs available, Suction available and Patient being monitored Patient Re-evaluated:Patient Re-evaluated prior to inductionOxygen Delivery Method: Circle system utilized Preoxygenation: Pre-oxygenation with 100% oxygen Intubation Type: IV induction Ventilation: Mask ventilation without difficulty Laryngoscope Size: Miller and 3 Nasal Tubes: Left, Magill forceps - small, utilized and Nasal prep performed Number of attempts: 2 Placement Confirmation: ETT inserted through vocal cords under direct vision and positive ETCO2 Dental Injury: Teeth and Oropharynx as per pre-operative assessment

## 2013-12-25 NOTE — Anesthesia Postprocedure Evaluation (Signed)
  Anesthesia Post-op Note  Patient: [name]  Procedure(s) Performed: Procedure(s): Extraction of tooth #'s 2,3,4,5,6,8,9,10,11,12,13,14,15,20,21,22,23,24,25,26,27, [phone] with alveoloplasty and bilateral mandibular tori reductions (N/A)  Patient Location: PACU  Anesthesia Type:General  Level of Consciousness: awake and alert   Airway and Oxygen Therapy: Patient Spontanous Breathing  Post-op Pain: mild  Post-op Assessment: Post-op Vital signs reviewed, Patient's Cardiovascular Status Stable, Respiratory Function Stable, Patent Airway, No signs of Nausea or vomiting and Pain level controlled  Post-op Vital Signs: Reviewed and stable  Last Vitals:  Filed Vitals:   12/25/13 1200  BP: 188/120  Pulse: 94  Temp:   Resp: 18    Complications: No apparent anesthesia complications

## 2013-12-25 NOTE — Addendum Note (Signed)
Addendum created 12/25/13 1214 by Laurie Panda, MD   Modules edited: Orders

## 2013-12-25 NOTE — Progress Notes (Signed)
OT Cancellation Note  Patient Details Name: [name] MRN: [id number] DOB: Jan 01, 1971   Cancelled Treatment:    Reason Eval/Treat Not Completed: Patient at procedure or test/ unavailable;Other (comment). Pt off floor for surgery, will follow up next available/appropriate time  Mosetta Putt 12/25/2013, 10:31 AM

## 2013-12-25 NOTE — Transfer of Care (Signed)
Immediate Anesthesia Transfer of Care Note  Patient: [name]  Procedure(s) Performed: Procedure(s): Extraction of tooth #'s 2,3,4,5,6,8,9,10,11,12,13,14,15,20,21,22,23,24,25,26,27, [phone] with alveoloplasty and bilateral mandibular tori reductions (N/A)  Patient Location: PACU  Anesthesia Type:General  Level of Consciousness: awake, alert  and oriented  Airway & Oxygen Therapy: Patient Spontanous Breathing and Patient connected to face mask oxygen  Post-op Assessment: Report given to PACU RN and Post -op Vital signs reviewed and stable  Post vital signs: Reviewed and stable  Complications: No apparent anesthesia complications

## 2013-12-25 NOTE — Progress Notes (Signed)
INITIAL NUTRITION ASSESSMENT  DOCUMENTATION CODES Per approved criteria  -Not Applicable   INTERVENTION: - Diet advancement per MD - Resource Breeze BID - Recommend Ensure Complete BID when diet advanced - Had long discussion with pt and mother about what foods to eat after surgery with an emphasis on moistened texture and consistency from all food groups. Emphasized no straws.  - RD to continue to monitor  NUTRITION DIAGNOSIS: Inadequate oral intake related to clear liquid diet as evidenced by diet order.   Goal: Advance diet as tolerated to puree diet  Monitor:  Weights, labs, diet advancement  Reason for Assessment: Consult for assessment   43 y.o. female  Admitting Dx: Neuropathic pain  ASSESSMENT: Admitted with lower extremity pain this started 2 months prior to admission progressively getting worst as its ascending. Pt with history significant for heavy smoking and daily ETOH consumption. Had facial cellulitis, seen by dentist, and had 25 of her teeth extracted today.   Seen by inpatient RD 4/9 earlier in admission who noted that pt reported "several months ago" she weighed 135 lb. She states that she has had unintentional weight loss 2/2 decreased oral intake from mouth pain. She was eating well prior to surgery, 100% of meals since admission.   Pt and mother in room. Had long discussion about diet therapy after teeth extraction. Consistency and texture of recommended foods discussed as well as nutritional supplements to get additional protein.   Height: Ht Readings from Last 1 Encounters:  12/18/13 5\' 5"  (1.651 m)    Weight: Wt Readings from Last 1 Encounters:  12/25/13 134 lb 11.2 oz (61.1 kg)    Ideal Body Weight: 125 lbs  % Ideal Body Weight: 107%  Wt Readings from Last 10 Encounters:  12/25/13 134 lb 11.2 oz (61.1 kg)  12/25/13 134 lb 11.2 oz (61.1 kg)  12/25/13 134 lb 11.2 oz (61.1 kg)  12/25/13 134 lb 11.2 oz (61.1 kg)  12/25/13 134 lb 11.2 oz (61.1  kg)  11/19/13 123 lb 1.6 oz (55.838 kg)    Usual Body Weight: 135 lbs per pt  % Usual Body Weight: 99%  BMI:  Body mass index is 22.42 kg/(m^2).  Estimated Nutritional Needs: Kcal: 1600-1800 Protein: 60-75g Fluid: 1.6-1.8L/day  Skin: Non-pitting RUE, LUE edema, +3 RLE, LLE edema  Diet Order: Clear Liquid  EDUCATION NEEDS: -Education needs addressed - had long discussion with pt and mother about diet therapy after having teeth removed   Intake/Output Summary (Last 24 hours) at 12/25/13 1416 Last data filed at 12/25/13 1030  Gross per 24 hour  Intake   2400 ml  Output      0 ml  Net   2400 ml    Last BM: 4/14, loose green/brown  Labs:   Recent Labs Lab 12/20/13 0653 12/20/13 1238  12/23/13 0715 12/24/13 0708 12/25/13 0634  NA 136*  --   < > 139 138 139  K 3.2*  --   < > 3.5* 3.7 3.6*  CL 95*  --   < > 97 97 98  CO2 26  --   < > 25 25 27   BUN 5*  --   < > <3* 3* 3*  CREATININE 0.54  --   < > 0.47* 0.47* 0.46*  CALCIUM 7.9*  --   < > 8.4 8.4 8.8  MG  --  1.6  --   --   --   --   GLUCOSE 93  --   < > 93  108* 87  < > = values in this interval not displayed.  CBG (last 3)  No results found for this basename: GLUCAP,  in the last 72 hours  Scheduled Meds: . ampicillin-sulbactam (UNASYN) IV  3 g Intravenous Q6H  . carvedilol  12.5 mg Oral BID WC  . DULoxetine  30 mg Oral Daily  . folic acid  1 mg Oral Daily  . furosemide  40 mg Oral Daily  . gabapentin  400 mg Oral TID  . multivitamin with minerals  1 tablet Oral Daily  . polyethylene glycol  17 g Oral Daily  . potassium chloride  20 mEq Oral Daily  . sodium chloride  3 mL Intravenous Q12H  . thiamine  100 mg Oral Daily  . vancomycin  750 mg Intravenous Q12H  . vitamin B-12  1,000 mcg Oral Daily    Continuous Infusions: . sodium chloride    . sodium chloride 10 mL/hr (12/25/13 1343)  . [START ON 12/26/2013] sodium chloride irrigation      History reviewed. No pertinent past medical history.  Past  Surgical History  Procedure Laterality Date  . Tubal ligation    . Sural nerve bx Right 12/24/2013    Procedure: SURAL NERVE BIOPSY;  Surgeon: Kristeen Miss, MD;  Location: [place] [facility];  Service: Neurosurgery;  Laterality: Right;    Mikey College MS, [place], [place] Pager [phone] After Hours Pager

## 2013-12-25 NOTE — Discharge Instructions (Signed)

## 2013-12-25 NOTE — Progress Notes (Signed)
PATIENT DETAILS Name: [name] Age: 43 y.o. Sex: female Date of Birth: 1970/12/22 Admit Date: 12/18/2013 Admitting Physician Charlynne Cousins, MD PCP:No PCP Per Patient  Brief summary: Patient is a 43 year old female with a history of alcohol use who presented to the hospital with right facial swelling and cellulitis. A CT of the face on admission was negative for abscess, she was then admitted for further evaluation and treatment, and started on empiric antibiotic therapy. She also has a 2 month history of lower extremity weakness, paresthesias and significant hyperesthesia. Hospital course has been uneventful, cellulitis of the face is almost resolved, she is currently undergoing elaborate workup at the discretion of neurology for lower extremity neuropathy.  Subjective: Seen after her dental extraction, mother at bedside. Has slight mouth pain  Assessment/Plan: Active Problems: Facial cellulitis- Right - Admitted, and started on vancomycin on 4/8, since foci is likely dental caries, added Unasyn for anaerobic coverage on 4/9-continue. Clinically much improved, is afebrile, leukocytosis persistent but stable. Significantly decreased in the right sided swelling and erythema, now has to small indurated areas in the right upper lip, that is intermittently draining purulent material. Curbsided ENT-Dr Janace Hoard on 4/11, who quickly eyeballed the patient, and suggested that we c/w Abx, he did not think a I&D was warranted at this time.CT of the facial region done on admission was negative for abscess. Continue with IV vancomycin and Unasyn.  -  Dentistry on board, for teeth extraction on 4/15.  Systemic inflammatory response syndrome - Secondary to above, treated with antibiotics.  Bilateral lower extremity neuropathy - Neurology consulted, workup in progress.Etiology unknown, MRI of the cervical/thoracic spine negative for acute abnormalities, ANA negative.  RPR nonreactive, HIV  nonreactive, ESR only at 5, rheumatoid factor negative. Vitamin B12 at 381.Heavy metal screen, SPEP pending - Increased Neurontin to 486m TID and started Cymbalta, will need to minimize/taper narcotics slowly - PT evaluation done-CIR recommended-recommended HHPT, but suspect will need SNF on discharge  -Sural nerve biopsy done yesterday. Results are pending. - LP done by neurology on 4/14, results are pending.  Hyponatremia - Likely secondary to dehydration, resolved with IV fluids.  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 Claims to drink on weekends and one or twice in the weekdays  Anemia -Anemia panel consistent with Anemia of Chronic disease,likely worsened by acute illness -No indication for transfusion at this time -continue to monitor Hb periodically  B/L lower extremity swelling -?secondary to hypoalbuminemia-has hx of ETOH use-but LFT's not that deranged. UA negative for Proteinuria, will check Liver Ultrasound and Echo. Will also get a doppler ultrasound of lower legs. Could be from the neuropathy as well. -start lasix, with K.  Hypertension - Likely worsened by peripheral neuropathy/pain - Continue Coreg, increased to 25 mg twice a day. Continue Lasix. We'll continue to monitor and optimize antihypertensive regimen.  Disposition: Remain inpatient  DVT Prophylaxis: Prophylactic Heparin  Code Status: Full code   Family Communication Mother at bedside  Procedures:   lumbar puncture- 4/14  CONSULTS:  neurology  Neurosurgery  Dentistry  MEDICATIONS: Scheduled Meds: . ampicillin-sulbactam (UNASYN) IV  3 g Intravenous Q6H  . carvedilol  12.5 mg Oral BID WC  . DULoxetine  30 mg Oral Daily  . feeding supplement (RESOURCE BREEZE)  1 Container Oral BID BM  . folic acid  1 mg Oral Daily  . furosemide  40 mg Oral Daily  .  gabapentin  400 mg Oral TID  . multivitamin with minerals  1 tablet Oral Daily    . polyethylene glycol  17 g Oral Daily  . potassium chloride  20 mEq Oral Daily  . sodium chloride  3 mL Intravenous Q12H  . thiamine  100 mg Oral Daily  . vancomycin  750 mg Intravenous Q12H  . vitamin B-12  1,000 mcg Oral Daily   Continuous Infusions: . sodium chloride    . sodium chloride 10 mL/hr (12/25/13 1343)  . [START ON 12/26/2013] sodium chloride irrigation     PRN Meds:.acetaminophen, acetaminophen, alum & mag hydroxide-simeth, hydrALAZINE, HYDROcodone-acetaminophen, HYDROmorphone (DILAUDID) injection, menthol-cetylpyridinium, morphine injection, ondansetron (ZOFRAN) IV, ondansetron, phenol, sodium chloride  Antibiotics: Anti-infectives   Start     Dose/Rate Route Frequency Ordered Stop   12/23/13 1900  vancomycin (VANCOCIN) IVPB 750 mg/150 ml premix     750 mg 150 mL/hr over 60 Minutes Intravenous Every 12 hours 12/23/13 1543     12/22/13 1930  vancomycin (VANCOCIN) IVPB 750 mg/150 ml premix  Status:  Discontinued     750 mg 150 mL/hr over 60 Minutes Intravenous Every 12 hours 12/22/13 1856 12/23/13 0909   12/19/13 0930  Ampicillin-Sulbactam (UNASYN) 3 g in sodium chloride 0.9 % 100 mL IVPB     3 g 100 mL/hr over 60 Minutes Intravenous Every 6 hours 12/19/13 0826     12/19/13 0600  vancomycin (VANCOCIN) IVPB 750 mg/150 ml premix  Status:  Discontinued     750 mg 150 mL/hr over 60 Minutes Intravenous Every 12 hours 12/18/13 1808 12/22/13 1856   12/18/13 1815  vancomycin (VANCOCIN) IVPB 1000 mg/200 mL premix     1,000 mg 200 mL/hr over 60 Minutes Intravenous NOW 12/18/13 1807 12/18/13 2006   12/18/13 1545  clindamycin (CLEOCIN) IVPB 600 mg     600 mg 100 mL/hr over 30 Minutes Intravenous  Once 12/18/13 1539 12/18/13 1632       PHYSICAL EXAM: Vital signs in last 24 hours: Filed Vitals:   12/25/13 1230 12/25/13 1245 12/25/13 1300 12/25/13 1332  BP: 178/108 150/109 170/109 158/101  Pulse: 83 80 79 83  Temp:    97.9 F (36.6 C)  TempSrc:    Oral  Resp: _0 Height:      Weight:      SpO2: 95% 95% 96% 93%    Weight change: 2.178 kg (4 lb 12.8 oz) Filed Weights   12/23/13 0452 12/24/13 0458 12/25/13 0355  Weight: 58.922 kg (129 lb 14.4 oz) 58.922 kg (129 lb 14.4 oz) 61.1 kg (134 lb 11.2 oz)   Body mass index is 22.42 kg/(m^2).   Gen Exam: Awake and alert with clear speech. Right facial swelling significantly decreased, 2 small indurated areas in the right upper lip-not draining this am, +dental carries Neck: Supple, No JVD.   Chest: B/L Clear.   CVS: S1 S2 Regular, no murmurs.  Abdomen: soft, BS +, non tender, non distended.  Extremities: + edema, lower extremities warm to touch. Neurologic: Non Focal.   Skin: No Rash.  Wounds: N/A.    Intake/Output from previous day:  Intake/Output Summary (Last 24 hours) at 12/25/13 1725 Last data filed at 12/25/13 1030  Gross per 24 hour  Intake   2400 ml  Output      0 ml  Net   2400 ml     LAB RESULTS: CBC  Recent Labs Lab 12/21/13 0750 12/22/13 0650 12/23/13 0715 12/24/13 0708 12/25/13  0634  WBC 13.7* 16.7* 19.5* 18.9* 17.5*  HGB 8.1* 7.8* 8.0* 8.1* 8.4*  HCT 24.1* 23.7* 23.9* 23.9* 26.0*  PLT 304 309 333 332 379  MCV 101.3* 102.2* 102.1* 102.1* 103.6*  MCH 34.0 33.6 34.2* 34.6* 33.5  MCHC 33.6 32.9 33.5 33.9 32.3  RDW 15.6* 15.4 15.8* 16.4* 17.0*    Chemistries   Recent Labs Lab 12/20/13 0653 12/20/13 1238 12/21/13 0750 12/22/13 0650 12/23/13 0715 12/24/13 0708 12/25/13 0634  NA 136*  --  135* 139 139 138 139  K 3.2*  --  3.2* 3.3* 3.5* 3.7 3.6*  CL 95*  --  95* 100 97 97 98  CO2 26  --  _0 GLUCOSE 93  --  89 100* 93 108* 87  BUN 5*  --  3* <3* <3* 3* 3*  CREATININE 0.54  --  0.45* 0.43* 0.47* 0.47* 0.46*  CALCIUM 7.9*  --  8.1* 8.3* 8.4 8.4 8.8  MG  --  1.6  --   --   --   --   --     CBG: No results found for this basename: GLUCAP,  in the last 168 hours  GFR Estimated Creatinine Clearance: 82.4 ml/min (by C-G formula based on Cr of  0.46).  Coagulation profile  Recent Labs Lab 12/19/13 0530  INR 1.06    Cardiac Enzymes No results found for this basename: CK, CKMB, TROPONINI, MYOGLOBIN,  in the last 168 hours  No components found with this basename: POCBNP,  No results found for this basename: DDIMER,  in the last 72 hours No results found for this basename: HGBA1C,  in the last 72 hours No results found for this basename: CHOL, HDL, LDLCALC, TRIG, CHOLHDL, LDLDIRECT,  in the last 72 hours No results found for this basename: TSH, T4TOTAL, FREET3, T3FREE, THYROIDAB,  in the last 72 hours No results found for this basename: VITAMINB12, FOLATE, FERRITIN, TIBC, IRON, RETICCTPCT,  in the last 72 hours No results found for this basename: LIPASE, AMYLASE,  in the last 72 hours  Urine Studies No results found for this basename: UACOL, UAPR, USPG, UPH, UTP, UGL, UKET, UBIL, UHGB, UNIT, UROB, ULEU, UEPI, UWBC, URBC, UBAC, CAST, CRYS, UCOM, BILUA,  in the last 72 hours  MICROBIOLOGY: Recent Results (from the past 240 hour(s))  CULTURE, BLOOD (ROUTINE X 2)     Status: None   Collection Time    12/18/13  7:48 PM      Result Value Ref Range Status   Specimen Description BLOOD ARM LEFT   Final   Special Requests BOTTLES DRAWN AEROBIC AND ANAEROBIC 10CC   Final   Culture  Setup Time     Final   Value: 12/19/2013 00:57     Performed at [facility]   Culture     Final   Value: NO GROWTH 5 DAYS     Performed at [facility]   Report Status 12/25/2013 FINAL   Final  CULTURE, BLOOD (ROUTINE X 2)     Status: None   Collection Time    12/18/13  7:53 PM      Result Value Ref Range Status   Specimen Description BLOOD HAND LEFT   Final   Special Requests BOTTLES DRAWN AEROBIC ONLY 10CC   Final   Culture  Setup Time     Final   Value: 12/19/2013 00:58     Performed at [facility]  Final   Value: NO GROWTH 5 DAYS     Performed at [facility]   Report Status 12/25/2013 FINAL    Final  GRAM STAIN     Status: None   Collection Time    12/24/13  8:23 AM      Result Value Ref Range Status   Specimen Description CSF   Final   Special Requests CSF FLUID NO 4 8CC   Final   Gram Stain     Final   Value: CSF CYTOSPIN     NO WBC SEEN     NO ORGANISMS SEEN   Report Status 12/24/2013 FINAL   Final  FUNGAL STAIN     Status: None   Collection Time    12/24/13  8:23 AM      Result Value Ref Range Status   Specimen Description CSF   Final   Special Requests CSF FLUID NO 4 8CC   Final   Fungal Smear     Final   Value: NO YEAST OR FUNGAL ELEMENTS SEEN     Performed at [facility]   Report Status 12/25/2013 FINAL   Final  CSF CULTURE     Status: None   Collection Time    12/24/13  8:23 AM      Result Value Ref Range Status   Specimen Description CSF   Final   Special Requests CSF FLUID TUBE 4 8CC   Final   Gram Stain     Final   Value: CYTOSPIN SLIDE NO WBC SEEN     NO ORGANISMS SEEN     Performed at [facility]     Performed at [facility]   Culture     Final   Value: NO GROWTH 1 DAY     Performed at [facility]   Report Status PENDING   Incomplete  AFB CULTURE WITH SMEAR     Status: None   Collection Time    12/24/13  8:23 AM      Result Value Ref Range Status   Specimen Description CSF   Final   Special Requests CSF FLUID NO 4 8CC   Final   ACID FAST SMEAR     Final   Value: NO ACID FAST BACILLI SEEN     Performed at [facility]   Culture     Final   Value: CULTURE WILL BE EXAMINED FOR 6 WEEKS BEFORE ISSUING A FINAL REPORT     Performed at [facility]   Report Status PENDING   Incomplete  SURGICAL PCR SCREEN     Status: None   Collection Time    12/24/13  2:09 PM      Result Value Ref Range Status   MRSA, PCR NEGATIVE  NEGATIVE Final   Staphylococcus aureus NEGATIVE  NEGATIVE Final   Comment:            The Xpert SA Assay (FDA     approved for NASAL specimens     in patients over 21 years of  age),     is one component of     a comprehensive surveillance     program.  Test performance has     been validated by [employer] for patients greater     than or equal to 93 year old.     It is not intended     to diagnose infection nor to  guide or monitor treatment.    RADIOLOGY STUDIES/RESULTS: Dg Orthopantogram  12/19/2013   CLINICAL DATA:  Facial cellulitis  EXAM: ORTHOPANTOGRAM/PANORAMIC  COMPARISON:  CT 12/18/2013  FINDINGS: Multiple caries especially right lower molars and left upper third molar.  Periapical lucency around two right lower molars consistent with infection. This is noted on the CT.  Negative for fracture or mass.  IMPRESSION: Dental caries.  Periapical lucency around two right lower molars consistent with infection.   Electronically Signed   By: Franchot Gallo M.D.   On: 12/19/2013 09:09   Dg Chest 2 View  11/19/2013   CLINICAL DATA Chest pain.  EXAM CHEST  2 VIEW  COMPARISON None.  FINDINGS No cardiomegaly. Unremarkable mediastinal contours. No infiltrate, edema, effusion, or pneumothorax. There is an apparent 22 mm long lucency in the posterior right fifth rib. This could be related to a true expansile bone lesion or summation of lung markings.  IMPRESSION 1. No active cardiopulmonary disease. 2. Questionable expansile bone lesion in the posterior right fifth rib. Recommend dedicated rib series for confirmation (would like to exclude CT radiation due to young age).  SIGNATURE  Electronically Signed   By: Jorje Guild M.D.   On: 11/19/2013 13:27   Dg Ribs Unilateral Right  11/19/2013   CLINICAL DATA Lower rib pain, fall 3 weeks ago  EXAM RIGHT RIBS - 2 VIEW  COMPARISON 11/19/2013  FINDINGS Radiopaque marker over the right lower ribs. No displaced fracture or definite focal rib abnormality. Inferior right ribs appear intact. Right lung clear. No effusion or pneumothorax. No chest wall asymmetry or subcutaneous emphysema.  IMPRESSION No acute osseous finding   SIGNATURE  Electronically Signed   By: Daryll Brod M.D.   On: 11/19/2013 16:57   Ct Maxillofacial W/cm  12/18/2013   CLINICAL DATA:  Right-sided facial pain and swelling.  EXAM: CT MAXILLOFACIAL WITH CONTRAST  TECHNIQUE: Multidetector CT imaging of the maxillofacial structures was performed with intravenous contrast. Multiplanar CT image reconstructions were also generated. A small metallic BB was placed on the right temple in order to reliably differentiate right from left.  CONTRAST:  81m OMNIPAQUE IOHEXOL 300 MG/ML  SOLN  COMPARISON:  None.  FINDINGS: There is a focal area of subcutaneous soft tissue swelling/ edema involving the right facial area overlying the right mandible and maxilla. No discrete drainable soft tissue abscess.  There is extensive dental disease with dental caries and right-sided periapical lucency involving the mandibular molars. No abnormality in the floor of the mouth. The tongue base is normal.  The parotid and submandibular glands are normal. The epiglottis is normal. The paraglottic fat planes are maintained. The piriform sinus and vallecular air spaces are normal.  Small scattered bilateral neck nodes but no mass or adenopathy.  The visualized portion of the brain is unremarkable. The paranasal sinuses and mastoid air cells are clear. Globes are intact.  IMPRESSION: Right facial cellulitis without discrete abscess. This is due to periapical abscess involving the right mandibular molar.   Electronically Signed   By: MKalman JewelsM.D.   On: 12/18/2013 18:03    MVerlee Monte MD  [facility] Pager:[phone] If 7PM-7AM, please contact night-coverage [email or web address] Password [email or web address] 12/25/2013, 5:25 PM   LOS: 7 days

## 2013-12-25 NOTE — Progress Notes (Signed)
ANTIBIOTIC CONSULT NOTE - Follow Up Consult  Pharmacy Consult for vancomycin and Unasyn Indication: facial cellutlits  No Known Allergies  Patient Measurements: Height: 5\' 5"  (165.1 cm) Weight: 134 lb 11.2 oz (61.1 kg) IBW/kg (Calculated) : 57   Vital Signs: Temp: 97.9 F (36.6 C) (04/15 1332) Temp src: Oral (04/15 1332) BP: 158/101 mmHg (04/15 1332) Pulse Rate: 83 (04/15 1332) Intake/Output from previous day: 04/14 0701 - 04/15 0700 In: 0867 [P.O.:358; I.V.:800; IV Piggyback:200] Out: -  Intake/Output from this shift: Total I/O In: 1500 [I.V.:1500] Out: -   Labs:  Recent Labs  12/23/13 0715 12/24/13 0708 12/25/13 0634  WBC 19.5* 18.9* 17.5*  HGB 8.0* 8.1* 8.4*  PLT 333 332 379  CREATININE 0.47* 0.47* 0.46*   Estimated Creatinine Clearance: 82.4 ml/min (by C-G formula based on Cr of 0.46).  Recent Labs  12/25/13 0630  VANCOTROUGH 13.7     Microbiology: Recent Results (from the past 720 hour(s))  CULTURE, BLOOD (ROUTINE X 2)     Status: None   Collection Time    12/18/13  7:48 PM      Result Value Ref Range Status   Specimen Description BLOOD ARM LEFT   Final   Special Requests BOTTLES DRAWN AEROBIC AND ANAEROBIC 10CC   Final   Culture  Setup Time     Final   Value: 12/19/2013 00:57     Performed at [facility]   Culture     Final   Value: NO GROWTH 5 DAYS     Performed at [facility]   Report Status 12/25/2013 FINAL   Final  CULTURE, BLOOD (ROUTINE X 2)     Status: None   Collection Time    12/18/13  7:53 PM      Result Value Ref Range Status   Specimen Description BLOOD HAND LEFT   Final   Special Requests BOTTLES DRAWN AEROBIC ONLY 10CC   Final   Culture  Setup Time     Final   Value: 12/19/2013 00:58     Performed at [facility]   Culture     Final   Value: NO GROWTH 5 DAYS     Performed at [facility]   Report Status 12/25/2013 FINAL   Final  GRAM STAIN     Status: None   Collection Time    12/24/13   8:23 AM      Result Value Ref Range Status   Specimen Description CSF   Final   Special Requests CSF FLUID NO 4 8CC   Final   Gram Stain     Final   Value: CSF CYTOSPIN     NO WBC SEEN     NO ORGANISMS SEEN   Report Status 12/24/2013 FINAL   Final  FUNGAL STAIN     Status: None   Collection Time    12/24/13  8:23 AM      Result Value Ref Range Status   Specimen Description CSF   Final   Special Requests CSF FLUID NO 4 8CC   Final   Fungal Smear     Final   Value: NO YEAST OR FUNGAL ELEMENTS SEEN     Performed at [facility]   Report Status 12/25/2013 FINAL   Final  CSF CULTURE     Status: None   Collection Time    12/24/13  8:23 AM      Result Value Ref Range Status   Specimen Description CSF  Final   Special Requests CSF FLUID TUBE 4 8CC   Final   Gram Stain     Final   Value: CYTOSPIN SLIDE NO WBC SEEN     NO ORGANISMS SEEN     Performed at [facility]     Performed at [facility]   Culture     Final   Value: NO GROWTH 1 DAY     Performed at [facility]   Report Status PENDING   Incomplete  SURGICAL PCR SCREEN     Status: None   Collection Time    12/24/13  2:09 PM      Result Value Ref Range Status   MRSA, PCR NEGATIVE  NEGATIVE Final   Staphylococcus aureus NEGATIVE  NEGATIVE Final   Comment:            The Xpert SA Assay (FDA     approved for NASAL specimens     in patients over 70 years of age),     is one component of     a comprehensive surveillance     program.  Test performance has     been validated by [employer] for patients greater     than or equal to 80 year old.     It is not intended     to diagnose infection nor to     guide or monitor treatment.   Assessment: 43 yo female with facial cellulitis who continues on vancomycin and Unasyn. She is s/p teeth extraction today. Renal function remains stable with CrCl ~12mL/min. A vancomycin trough drawn this morning was therapeutic at 13.49mcg/mL.  Goal of  Therapy:  Vancomycin trough level 10-15 mcg/ml  Plan:  1. Continue vancomycin 750mg  IV q12h 2. Continue Unasyn 3g IV q6h 3. Follow renal function, clinical progression, LOT  Dervin Vore D. Austen Wygant, PharmD, BCPS Clinical Pharmacist Pager: [phone] 12/25/2013 1:41 PM

## 2013-12-25 NOTE — Progress Notes (Signed)
PT Cancellation Note  Patient Details Name: [name] MRN: [id number] DOB: 1971-05-22   Cancelled Treatment:     Pt off the floor for surgery at this time. Will plan to see pt at next available time.    [place], [place] 12/25/2013, 9:13 AM

## 2013-12-25 NOTE — Progress Notes (Signed)
Labetalol 15mg  given for hypertension(180/120's), as per anesthesia order. Pressure stays around 170/100's, informed Dr.Singer. No new orders, pt is ready to transfer.  Gershon Crane

## 2013-12-25 NOTE — Op Note (Signed)
Patient:            [name] Date of Birth:  13-Jan-1971 MRN:                [id number]   DATE OF PROCEDURE:  12/25/2013               OPERATIVE REPORT   PREOPERATIVE DIAGNOSES: 1.  History of right facial swelling 2.  History of acute pulpitis  3.  Chronic apical periodontitis 4.  Chronic periodontitis 5.  Dental caries 6.  Multiple retained root segments   POSTOPERATIVE DIAGNOSES: 1.  History of right facial swelling 2.  History of acute pulpitis  3.  Chronic apical periodontitis 4.  Chronic periodontitis 5.  Dental caries 6.  Multiple retained root segments  7.  Bilateral mandibular lingual tori  OPERATIONS: 1. Multiple extraction of tooth numbers  2, 3, 4, 5, 6, 8, 9, 10, 11, 12, 13, 14, 15, 20, 21, 22, 23, 24, 25, 26, 27, 28, 29, 30, and 31. 2. 4 Quadrants of alveoloplasty 2. Bilateral mandibular lingual tori reductions   SURGEON: Lenn Cal, DDS  ASSISTANT: Camie Patience, (dental assistant)  ANESTHESIA: General anesthesia via nasoendotracheal tube.  MEDICATIONS: 1. vancomycin IV per previous orders  2. Local anesthesia with a total utilization of 4 carpules each containing 34 mg of lidocaine with 0.017 mg of epinephrine as well as 2 carpules each containing 9 mg of bupivacaine with 0.009 mg of epinephrine.  SPECIMENS: There are 25 teeth that were discarded.  DRAINS: None  CULTURES: None  COMPLICATIONS: None   ESTIMATED BLOOD LOSS: 100 mLs.  INTRAVENOUS FLUIDS: 1500 mL of normal saline solution  INDICATIONS: The patient was recently diagnosed with right facial swelling.  A dental consultation was then requested to evaluate poor dentition as possible source of the right facial swelling.  The patient was examined and treatment planned for extraction of remaining teeth with alveoloplasty and pre-prosthetic surgery as indicated after general anesthesia.  This treatment plan was formulated to decrease the risks and complications associated with dental  infection from further affecting the patient's systemic health and to assist in prosthodontics rehabilitation for the patient.  OPERATIVE FINDINGS: Patient was examined in operating room number 2.  The teeth were identified for extraction. At this time, bilateral mandibular lingual tori were also noted. Tooth #7 was also noted to be missing at this time. The patient was noted be affected by chronic periodontitis, chronic apical periodontitis, history of acute pulpitis, multiple dental caries, multiple retained root segments, and presence of bilateral mandibular lingual tori.   DESCRIPTION OF PROCEDURE: Patient was brought to the main operating room number 2. Patient was then placed in the supine position on the operating table. General anesthesia was then induced per the anesthesia team. The patient was then prepped and draped in the usual manner for dental medicine procedure. A timeout was performed. The patient was identified and procedures were verified. A throat pack was placed at this time. The oral cavity was then thoroughly examined with the findings noted above. The patient was then ready for dental medicine procedure as follows:  Local anesthesia was then administered sequentially with a total utilization of 4 carpules each containing 34 mg of lidocaine with 0.017 mg of epinephrine as well as 2 carpules  each containing 9 mg bupivacaine with 0.009 mg of epinephrine.  The Maxillary left and right quadrants first approached. Anesthesia was then delivered utilizing infiltration with lidocaine with epinephrine. A #15 blade incision was  then made from the maxillary right tuberosity and extended to the maxillary left tuberosity.  A  surgical flap was then carefully reflected. Appropriate amounts of buccal and interseptal bone were then removed utilizing a surgical handpiece and bur and copious amounts of sterile water.  The teeth were then subluxated with a series of straight elevators. Tooth numbers  2, 3 were removed with a 53R forceps.  Tooth numbers 14 and 15 were removed with a 53L forceps.  Tooth numbers 4, 5, 6, 8, 9, 10, 11, 12, 13 were then removed with a 150 forceps. The retained roots left in the area of tooth numbers 3, 4, 5, 12, and 13. Further bone was then removed around the roots and the roots were then elevated out with a series of elevators and root tip pick without complications. Alveoloplasty was then performed utilizing a ronguers and bone file. The surgical site was then irrigated with copious amounts of sterile saline. The tissues were approximated and trimmed appropriately. A piece of Surgifoam was placed in the extraction socket area numbers 2, 3, and 15. The maxillary right surgical site was then closed from the maxillary right tuberosity and extended the mesial #8 utilizing 3-0 chromic gut suture in a continuous interrupted suture technique x1. Maxillary left surgical site was then closed from the maxillary left tuberosity and extended the mesial #9 utilizing 3-0 chromic gut suture in a continuous interrupted suture technique x1.  At this point time, the mandibular quadrants were approached. The patient was given bilateral inferior alveolar nerve blocks and long buccal nerve blocks utilizing the bupivacaine with epinephrine. Further infiltration was then achieved utilizing the lidocaine with epinephrine. A 15 blade incision was then made from the distal of number 18 and extended to the distal of #32 .  A surgical flap was then carefully reflected. Appropriate amounts of buccal and interseptal bone were then removed utilizing a surgical handpiece and copious amount of sterile water. The lower teeth were then subluxated with a series of straight elevators. Tooth numbers  20, 21, 22, 23, 24, 25, 26, 27, 28, 29, 30, and 31 were then removed utilizing a 151 forceps. Alveoloplasty was then performed utilizing a rongeurs and bone file. At this point time, the surgical flap was further  reflected on the lingual aspect to expose the bilateral mandibular lingual tori. The tori were then reduced utilizing a surgical handpiece and bur and copious of sterile water. Further alveoloplasty was then performed utilizing a rongeur and bone file .  The surgical site was then irrigated with copious amounts sterile saline. The tissues were approximated and trimmed appropriately. The surgical sites were then again irrigated with copious amounts of sterile saline. The mandibular left surgical site was then closed from the distal of  #18 and extended to the mesial number 24 utilizing 3-0 chromic gut suture in a continuous interrupted suture technique x1. The mandibular right surgical site was then closed from the distal of #32 and extended the mesial numbers 25 utilizing 3-0 chromic gut suture in a continuous interrupted suture technique x1. 5 interrupted sutures utilizing 4-0 chromic gut then utilized to further close the mandibular surgical sites as needed.   At this point time, the entire mouth was irrigated with copious amounts of sterile saline. The patient was examined for complications, seeing none, the dental medicine procedure was deemed to be complete. The throat pack was removed at this time. A series of 4 x 4 gauze were placed in the mouth to aid hemostasis. An  oral airway was then placed at the request of the anesthesia team . The patient was then handed over to the anesthesia team for final disposition. After an appropriate amount of time, the patient was extubated and taken to the postanesthsia care unit with stable vital signs and a good condition. All counts were correct for the dental medicine procedure.  The patient will be followed by the hospitalist team and discharged as indicated with appropriate pain medication and antibiotic therapy. Patient will be seen by dental medicine on 01/06/2014 for evaluation for suture removal.   Lenn Cal, DDS.

## 2013-12-25 NOTE — Progress Notes (Signed)
PRE-OPERATIVE NOTE:  12/25/2013 [name] [id number]  VITALS: BP 170/96  Pulse 100  Temp(Src) 97.9 F (36.6 C) (Oral)  Resp 17  Ht 5\' 5"  (1.651 m)  Wt 134 lb 11.2 oz (61.1 kg)  BMI 22.42 kg/m2  SpO2 92%  LMP 11/03/2013  Lab Results  Component Value Date   WBC 18.9* 12/24/2013   HGB 8.1* 12/24/2013   HCT 23.9* 12/24/2013   MCV 102.1* 12/24/2013   PLT 332 12/24/2013   BMET    Component Value Date/Time   NA 138 12/24/2013 0708   K 3.7 12/24/2013 0708   CL 97 12/24/2013 0708   CO2 25 12/24/2013 0708   GLUCOSE 108* 12/24/2013 0708   BUN 3* 12/24/2013 0708   CREATININE 0.47* 12/24/2013 0708   CALCIUM 8.4 12/24/2013 0708   GFRNONAA >90 12/24/2013 0708   GFRAA >90 12/24/2013 0708    Lab Results  Component Value Date   INR 1.06 12/19/2013   No results found for this basename: PTT     [name] presents for extraction of remaining teeth with alveoloplasty and pre-prosthetic surgery as needed in the OR with general anesthesia.   SUBJECTIVE: The patient denies any acute medical or dental changes and agrees to proceed with treatment as planned.  EXAM: No sign of acute dental changes.  ASSESSMENT: Patient is affected by H/O acute pulpititis, apical periodontitis, facial swelling, retained root segments, dental caries, and chronic periodontitis.   PLAN: Patient agrees to proceed with treatment as planned in the operating room as previously discussed and accepts the risks, benefits, complications of the proposed treatment.   Lenn Cal, DDS

## 2013-12-25 NOTE — Clinical Social Work Note (Signed)
CSW sent updated clinical information to [facility], the facility considering patient for LOG placement. Facility is requesting medication list that patient will DC on so they can determine the cost of her meds.  Liz Beach MSW, [place], [place], [phone]

## 2013-12-26 ENCOUNTER — Encounter (HOSPITAL_COMMUNITY): Payer: Self-pay | Admitting: Dentistry

## 2013-12-26 DIAGNOSIS — R221 Localized swelling, mass and lump, neck: Secondary | ICD-10-CM

## 2013-12-26 DIAGNOSIS — G609 Hereditary and idiopathic neuropathy, unspecified: Principal | ICD-10-CM

## 2013-12-26 DIAGNOSIS — R220 Localized swelling, mass and lump, head: Secondary | ICD-10-CM

## 2013-12-26 DIAGNOSIS — G589 Mononeuropathy, unspecified: Secondary | ICD-10-CM

## 2013-12-26 DIAGNOSIS — K08109 Complete loss of teeth, unspecified cause, unspecified class: Secondary | ICD-10-CM

## 2013-12-26 LAB — BASIC METABOLIC PANEL
BUN: 4 mg/dL — ABNORMAL LOW (ref 6–23)
CO2: 26 mEq/L (ref 19–32)
Calcium: 8.4 mg/dL (ref 8.4–10.5)
Chloride: 101 mEq/L (ref 96–112)
Creatinine, Ser: 0.66 mg/dL (ref 0.50–1.10)
GFR calc Af Amer: >90 mL/min (ref >90)
GFR calc non Af Amer: >90 mL/min (ref >90)
Glucose, Bld: 87 mg/dL (ref 70–99)
Potassium: 4.3 mEq/L (ref 3.7–5.3)
Sodium: 139 mEq/L (ref 137–147)

## 2013-12-26 LAB — VITAMIN B1: Vitamin B1 (Thiamine): 67 nmol/L — ABNORMAL HIGH (ref 8–30)

## 2013-12-26 MED ORDER — FUROSEMIDE 10 MG/ML IJ SOLN
40.0000 mg | Freq: Three times a day (TID) | INTRAMUSCULAR | Status: DC
  Administered 2013-12-26 – 2013-12-27 (×5): 40 mg via INTRAVENOUS
  Filled 2013-12-26 (×7): qty 4

## 2013-12-26 MED ORDER — HEPARIN SODIUM (PORCINE) 5000 UNIT/ML IJ SOLN
5000.0000 [IU] | Freq: Three times a day (TID) | INTRAMUSCULAR | Status: DC
  Administered 2013-12-26 – 2013-12-27 (×4): 5000 [IU] via SUBCUTANEOUS
  Filled 2013-12-26 (×6): qty 1

## 2013-12-26 MED ORDER — AMOXICILLIN-POT CLAVULANATE 875-125 MG PO TABS
1.0000 | ORAL_TABLET | Freq: Two times a day (BID) | ORAL | Status: DC
  Administered 2013-12-26 – 2013-12-27 (×3): 1 via ORAL
  Filled 2013-12-26 (×4): qty 1

## 2013-12-26 NOTE — Care Management Note (Signed)
    Page 1 of 1   12/27/2013     11:48:41 AM CARE MANAGEMENT NOTE 12/27/2013  Patient:  [name], [name]   Account Number:  [id number]  Date Initiated:  12/24/2013  Documentation initiated by:  Tomi Bamberger  Subjective/Objective Assessment:   dx nueropathy  admit- lives with friend and 43 yr old daughter.     Action/Plan:   pt eval- rec snf   Anticipated DC Date:  12/27/2013   Anticipated DC Plan:  SKILLED NURSING FACILITY  In-house referral  Clinical Social Worker      DC Planning Services  CM consult      Choice offered to / List presented to:             Status of service:  Completed, signed off Medicare Important Message given?   (If response is "NO", the following Medicare IM given date fields will be blank) Date Medicare IM given:   Date Additional Medicare IM given:    Discharge Disposition:  [place]  Per UR Regulation:  Reviewed for med. necessity/level of care/duration of stay  If discussed at [place] of Stay Meetings, dates discussed:    Comments:  12/25/13 [place], BSN [phone] patient is s/p sural muscle bx and lp, awaiting results from bx, patient had all teeth extracted on 4/15.  PLan is for SNF at dc, will need LOG, CSW following, [facility] considering taking patient.

## 2013-12-26 NOTE — Clinical Social Work Note (Signed)
CSW sent updated clinical information to [facility]. Facility states they will be able to offer patient a bed as long as her medications are not too expensive. CSW will continue to follow for DC needs.   Liz Beach MSW, [place], [place], [phone]

## 2013-12-26 NOTE — Clinical Documentation Improvement (Signed)
Possible Clinical Conditions? Chronic Systolic Congestive Heart Failure Chronic Diastolic Congestive Heart Failure Chronic Systolic & Diastolic Congestive Heart Failure Acute Systolic Congestive Heart Failure Acute Diastolic Congestive Heart Failure Acute Systolic & Diastolic Congestive Heart Failure Acute on Chronic Systolic Congestive Heart Failure Acute on Chronic Diastolic Congestive Heart Failure Acute on Chronic Systolic & Diastolic Congestive Heart Failure Other Condition Cannot Clinically Determine  Supporting Information: Signs & Symptoms: 3+ Pitting Edema Diagnostics: Labs:Pro B Natriuretic peptide (BNP) 0 - 125 pg/mL  4017.0 (H)     ECHO 12-23-13 Study Conclusions - Left ventricle: The cavity size was normal. Wall thickness   was normal. Systolic function was normal. The estimated   ejection fraction was in the range of 60% to 65%. Wall   motion was normal; there were no regional wall motion   abnormalities. Doppler parameters are consistent with   abnormal left ventricular relaxation (grade 1 diastolic   dysfunction). The E/e' ratio is <10, suggesting normal LV   filling pressure. - Left atrium: Moderately dilated (34 ml/m2). - Right ventricle: The cavity size was normal. Wall   thickness was normal. Systolic function was normal. - Atrial septum: No defect or patent foramen ovale was   identified. - Tricuspid valve: Moderate regurgitation. - Pulmonary arteries: PA peak pressure: 64mm Hg (S).   Moderate pulmonary hypertension. - Inferior vena cava: The vessel was normal in size; the   respirophasic diameter changes were in the normal range (=   50%); findings are consistent with normal central venous   pressure. Medications: (LASIX) injection 40 mg   [[id number]]    Ordered Dose: 40 mg Route: Intravenous Frequency: 3 times per day   (LASIX) tablet 40 mg   [[id number]]    Ordered Dose: 40 mg Route: Oral Frequency: Daily   Thank You, Alessandra Grout, RN, BSN,  CCDS, Clinical Documentation Specialist:  [phone]   [phone]=[facility]- Health Information Management

## 2013-12-26 NOTE — Progress Notes (Signed)
POST OPERATIVE NOTE: Postop day #1  12/26/2013 Vaughan Sine [id number]  VITALS: BP 101/70  Pulse 85  Temp(Src) 98.2 F (36.8 C) (Oral)  Resp 16  Ht 5\' 5"  (1.651 m)  Wt 141 lb 13.5 oz (64.34 kg)  BMI 23.60 kg/m2  SpO2 96%  LMP 11/03/2013  LABS:  Lab Results  Component Value Date   WBC 17.5* 12/25/2013   HGB 8.4* 12/25/2013   HCT 26.0* 12/25/2013   MCV 103.6* 12/25/2013   PLT 379 12/25/2013   BMET    Component Value Date/Time   NA 139 12/26/2013 0343   K 4.3 12/26/2013 0343   CL 101 12/26/2013 0343   CO2 26 12/26/2013 0343   GLUCOSE 87 12/26/2013 0343   BUN 4* 12/26/2013 0343   CREATININE 0.66 12/26/2013 0343   CALCIUM 8.4 12/26/2013 0343   GFRNONAA >90 12/26/2013 0343   GFRAA >90 12/26/2013 0343    Lab Results  Component Value Date   INR 1.06 12/19/2013   No results found for this basename: PTT     Vaughan Sine is status post extraction of remaining teeth with alveoloplasty and pre-prosthetic surgery as indicated in the operating room with general anesthesia.  SUBJECTIVE: Patient indicates that she has minimal discomfort and is primarily" sore". Patient denies any significant dental pain. Patient denies having any active bleeding.   EXAM: Patient has extraoral swelling and ecchymoses. Patient has intraoral swelling and ecchymoses. There is no sign of active bleeding, heme, or loose, or infection. Sutures are intact. Clots are present. Patient is now edentulous.  ASSESSMENT: Post operative course is consistent with dental procedures performed in the operating room. Patient is now edentulous  PLAN: 1. Continue saltwater rinses every 2 hours while awake. 2. Brush tongue daily. 3. Return to dental medicine on 01/06/14 for evaluation of healing and suture removal. Patient is aware that stitches will dissolve on their own. 4. Patient is okay for discharge from a dental standpoint.  Lenn Cal, DDS

## 2013-12-26 NOTE — Progress Notes (Signed)
PATIENT DETAILS Name: [name] Age: 43 y.o. Sex: female Date of Birth: Oct 17, 1970 Admit Date: 12/18/2013 Admitting Physician Charlynne Cousins, MD PCP:No PCP Per Patient  Brief summary: Patient is a 43 year old female with a history of alcohol use who presented to the hospital with right facial swelling and cellulitis. A CT of the face on admission was negative for abscess, she was then admitted for further evaluation and treatment, and started on empiric antibiotic therapy. She also has a 2 month history of lower extremity weakness, paresthesias and significant hyperesthesia. Hospital course has been uneventful, cellulitis of the face is almost resolved, she is currently undergoing elaborate workup at the discretion of neurology for lower extremity neuropathy.  Subjective: Feels much better since yesterday. The pain is controlled. Discussed with neurology, sent out labs, he needs followup as outpatient rather than staying in the hospital for these.  Assessment/Plan: Active Problems: Facial cellulitis- Right - Admitted, and started on vancomycin on 4/8, since foci is likely dental caries, added Unasyn for anaerobic coverage on 4/9-continue. Clinically much improved, is afebrile, leukocytosis persistent but stable. Significantly decreased in the right sided swelling and erythema, now has to small indurated areas in the right upper lip, that is intermittently draining purulent material. Curbsided ENT-Dr Janace Hoard on 4/11, who quickly eyeballed the patient, and suggested that we c/w Abx, he did not think a I&D was warranted at this time.CT of the facial region done on admission was negative for abscess. Continue with IV vancomycin and Unasyn.  -Her teeth extracted on 12/26/2011 (all of her teeth). -On vancomycin and Unasyn, switch  to oral Augmentin.  Systemic inflammatory response syndrome - Secondary to above, treated with antibiotics.  Bilateral lower extremity neuropathy -  Neurology consulted, workup in progress.Etiology unknown, MRI of the cervical/thoracic spine negative for acute abnormalities, ANA negative.  RPR nonreactive, HIV nonreactive, ESR only at 5, rheumatoid factor negative. Vitamin B12 at 381.Heavy metal screen, SPEP pending - Increased Neurontin to 469m TID and started Cymbalta, will need to minimize/taper narcotics slowly - PT evaluation done-CIR recommended-recommended HHPT, but suspect will need SNF on discharge  -Sural nerve biopsy done yesterday. Results are pending. - LP done by neurology on 4/14, results are pending.  Hyponatremia - Likely secondary to dehydration, resolved with IV fluids.  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 Claims to drink on weekends and one or twice in the weekdays  Anemia -Anemia panel consistent with Anemia of Chronic disease,likely worsened by acute illness -No indication for transfusion at this time -continue to monitor Hb periodically  B/L lower extremity swelling -?secondary to hypoalbuminemia-has hx of ETOH use-but LFT's not that deranged. UA negative for Proteinuria, will check Liver Ultrasound and Echo. Will also get a doppler ultrasound of lower legs. Could be from the neuropathy as well. -Started on high dose of Lasix.  Hypertension - Likely worsened by peripheral neuropathy/pain - Continue Coreg, increased to 25 mg twice a day. Continue Lasix. We'll continue to monitor and optimize antihypertensive regimen.  Disposition: Remain inpatient  DVT Prophylaxis: Prophylactic Heparin  Code Status: Full code   Family Communication Mother at bedside  Procedures:   lumbar puncture- 4/14  CONSULTS:  neurology  Neurosurgery  Dentistry  MEDICATIONS: Scheduled Meds: . amoxicillin-clavulanate  1 tablet Oral Q12H  . carvedilol  12.5 mg Oral BID WC  . DULoxetine  30 mg Oral Daily  . feeding supplement (RESOURCE BREEZE)  1 Container  Oral BID BM  . folic acid  1 mg Oral Daily  . furosemide  40 mg Intravenous 3 times per day  . furosemide  40 mg Oral Daily  . gabapentin  400 mg Oral TID  . heparin subcutaneous  5,000 Units Subcutaneous 3 times per day  . multivitamin with minerals  1 tablet Oral Daily  . polyethylene glycol  17 g Oral Daily  . potassium chloride  20 mEq Oral Daily  . sodium chloride  3 mL Intravenous Q12H  . thiamine  100 mg Oral Daily  . vitamin B-12  1,000 mcg Oral Daily   Continuous Infusions: . sodium chloride    . sodium chloride 1,000 mL (12/26/13 1052)  . sodium chloride irrigation 200 mL (12/26/13 1300)   PRN Meds:.acetaminophen, acetaminophen, alum & mag hydroxide-simeth, hydrALAZINE, HYDROcodone-acetaminophen, HYDROmorphone (DILAUDID) injection, menthol-cetylpyridinium, morphine injection, ondansetron (ZOFRAN) IV, ondansetron, phenol, sodium chloride  Antibiotics: Anti-infectives   Start     Dose/Rate Route Frequency Ordered Stop   12/26/13 1500  amoxicillin-clavulanate (AUGMENTIN) 875-125 MG per tablet 1 tablet     1 tablet Oral Every 12 hours 12/26/13 1402     12/23/13 1900  vancomycin (VANCOCIN) IVPB 750 mg/150 ml premix  Status:  Discontinued     750 mg 150 mL/hr over 60 Minutes Intravenous Every 12 hours 12/23/13 1543 12/26/13 1402   12/22/13 1930  vancomycin (VANCOCIN) IVPB 750 mg/150 ml premix  Status:  Discontinued     750 mg 150 mL/hr over 60 Minutes Intravenous Every 12 hours 12/22/13 1856 12/23/13 0909   12/19/13 0930  Ampicillin-Sulbactam (UNASYN) 3 g in sodium chloride 0.9 % 100 mL IVPB  Status:  Discontinued     3 g 100 mL/hr over 60 Minutes Intravenous Every 6 hours 12/19/13 0826 12/26/13 1402   12/19/13 0600  vancomycin (VANCOCIN) IVPB 750 mg/150 ml premix  Status:  Discontinued     750 mg 150 mL/hr over 60 Minutes Intravenous Every 12 hours 12/18/13 1808 12/22/13 1856   12/18/13 1815  vancomycin (VANCOCIN) IVPB 1000 mg/200 mL premix     1,000 mg 200 mL/hr over 60  Minutes Intravenous NOW 12/18/13 1807 12/18/13 2006   12/18/13 1545  clindamycin (CLEOCIN) IVPB 600 mg     600 mg 100 mL/hr over 30 Minutes Intravenous  Once 12/18/13 1539 12/18/13 1632       PHYSICAL EXAM: Vital signs in last 24 hours: Filed Vitals:   12/26/13 0228 12/26/13 0517 12/26/13 0612 12/26/13 1345  BP: 118/81 101/70  99/65  Pulse: 81 85  83  Temp: 98 F (36.7 C) 98.2 F (36.8 C)  98.9 F (37.2 C)  TempSrc: Axillary Oral  Oral  Resp: _0 Height:      Weight:   64.34 kg (141 lb 13.5 oz)   SpO2: 96% 96%  93%    Weight change: 3.24 kg (7 lb 2.3 oz) Filed Weights   12/24/13 0458 12/25/13 0355 12/26/13 0612  Weight: 58.922 kg (129 lb 14.4 oz) 61.1 kg (134 lb 11.2 oz) 64.34 kg (141 lb 13.5 oz)   Body mass index is 23.6 kg/(m^2).   Gen Exam: Awake and alert with clear speech. Right facial swelling significantly decreased, 2 small indurated areas in the right upper lip-not draining this am, +dental carries Neck: Supple, No JVD.   Chest: B/L Clear.   CVS: S1 S2 Regular, no murmurs.  Abdomen: soft, BS +, non tender, non distended.  Extremities: + edema, lower  extremities warm to touch. Neurologic: Non Focal.   Skin: No Rash.  Wounds: N/A.    Intake/Output from previous day:  Intake/Output Summary (Last 24 hours) at 12/26/13 1405 Last data filed at 12/26/13 1300  Gross per 24 hour  Intake 1852.83 ml  Output      0 ml  Net 1852.83 ml     LAB RESULTS: CBC  Recent Labs Lab 12/21/13 0750 12/22/13 0650 12/23/13 0715 12/24/13 0708 12/25/13 0634  WBC 13.7* 16.7* 19.5* 18.9* 17.5*  HGB 8.1* 7.8* 8.0* 8.1* 8.4*  HCT 24.1* 23.7* 23.9* 23.9* 26.0*  PLT 304 309 333 332 379  MCV 101.3* 102.2* 102.1* 102.1* 103.6*  MCH 34.0 33.6 34.2* 34.6* 33.5  MCHC 33.6 32.9 33.5 33.9 32.3  RDW 15.6* 15.4 15.8* 16.4* 17.0*    Chemistries   Recent Labs Lab 12/20/13 0653 12/20/13 1238  12/22/13 0650 12/23/13 0715 12/24/13 0708 12/25/13 0634 12/26/13 0343  NA  136*  --   < > 139 139 138 139 139  K 3.2*  --   < > 3.3* 3.5* 3.7 3.6* 4.3  CL 95*  --   < > 100 97 97 98 101  CO2 26  --   < > _0 GLUCOSE 93  --   < > 100* 93 108* 87 87  BUN 5*  --   < > <3* <3* 3* 3* 4*  CREATININE 0.54  --   < > 0.43* 0.47* 0.47* 0.46* 0.66  CALCIUM 7.9*  --   < > 8.3* 8.4 8.4 8.8 8.4  MG  --  1.6  --   --   --   --   --   --   < > = values in this interval not displayed.  CBG: No results found for this basename: GLUCAP,  in the last 168 hours  GFR Estimated Creatinine Clearance: 82.4 ml/min (by C-G formula based on Cr of 0.66).  Coagulation profile No results found for this basename: INR, PROTIME,  in the last 168 hours  Cardiac Enzymes No results found for this basename: CK, CKMB, TROPONINI, MYOGLOBIN,  in the last 168 hours  No components found with this basename: POCBNP,  No results found for this basename: DDIMER,  in the last 72 hours No results found for this basename: HGBA1C,  in the last 72 hours No results found for this basename: CHOL, HDL, LDLCALC, TRIG, CHOLHDL, LDLDIRECT,  in the last 72 hours No results found for this basename: TSH, T4TOTAL, FREET3, T3FREE, THYROIDAB,  in the last 72 hours No results found for this basename: VITAMINB12, FOLATE, FERRITIN, TIBC, IRON, RETICCTPCT,  in the last 72 hours No results found for this basename: LIPASE, AMYLASE,  in the last 72 hours  Urine Studies No results found for this basename: UACOL, UAPR, USPG, UPH, UTP, UGL, UKET, UBIL, UHGB, UNIT, UROB, ULEU, UEPI, UWBC, URBC, UBAC, CAST, CRYS, UCOM, BILUA,  in the last 72 hours  MICROBIOLOGY: Recent Results (from the past 240 hour(s))  CULTURE, BLOOD (ROUTINE X 2)     Status: None   Collection Time    12/18/13  7:48 PM      Result Value Ref Range Status   Specimen Description BLOOD ARM LEFT   Final   Special Requests BOTTLES DRAWN AEROBIC AND ANAEROBIC 10CC   Final   Culture  Setup Time     Final   Value: 12/19/2013 00:57     Performed at  [facility]   Culture     Final   Value: NO GROWTH 5 DAYS     Performed at [facility]   Report Status 12/25/2013 FINAL   Final  CULTURE, BLOOD (ROUTINE X 2)     Status: None   Collection Time    12/18/13  7:53 PM      Result Value Ref Range Status   Specimen Description BLOOD HAND LEFT   Final   Special Requests BOTTLES DRAWN AEROBIC ONLY 10CC   Final   Culture  Setup Time     Final   Value: 12/19/2013 00:58     Performed at [facility]   Culture     Final   Value: NO GROWTH 5 DAYS     Performed at [facility]   Report Status 12/25/2013 FINAL   Final  GRAM STAIN     Status: None   Collection Time    12/24/13  8:23 AM      Result Value Ref Range Status   Specimen Description CSF   Final   Special Requests CSF FLUID NO 4 8CC   Final   Gram Stain     Final   Value: CSF CYTOSPIN     NO WBC SEEN     NO ORGANISMS SEEN   Report Status 12/24/2013 FINAL   Final  FUNGAL STAIN     Status: None   Collection Time    12/24/13  8:23 AM      Result Value Ref Range Status   Specimen Description CSF   Final   Special Requests CSF FLUID NO 4 8CC   Final   Fungal Smear     Final   Value: NO YEAST OR FUNGAL ELEMENTS SEEN     Performed at [facility]   Report Status 12/25/2013 FINAL   Final  CSF CULTURE     Status: None   Collection Time    12/24/13  8:23 AM      Result Value Ref Range Status   Specimen Description CSF   Final   Special Requests CSF FLUID TUBE 4 8CC   Final   Gram Stain     Final   Value: CYTOSPIN SLIDE NO WBC SEEN     NO ORGANISMS SEEN     Performed at [facility]     Performed at [facility]   Culture     Final   Value: NO GROWTH 2 DAYS     Performed at [facility]   Report Status PENDING   Incomplete  AFB CULTURE WITH SMEAR     Status: None   Collection Time    12/24/13  8:23 AM      Result Value Ref Range Status   Specimen Description CSF   Final   Special Requests CSF FLUID NO 4 8CC    Final   ACID FAST SMEAR     Final   Value: NO ACID FAST BACILLI SEEN     Performed at [facility]   Culture     Final   Value: CULTURE WILL BE EXAMINED FOR 6 WEEKS BEFORE ISSUING A FINAL REPORT     Performed at [facility]   Report Status PENDING   Incomplete  SURGICAL PCR SCREEN     Status: None   Collection Time    12/24/13  2:09 PM      Result Value Ref Range Status   MRSA, PCR NEGATIVE  NEGATIVE Final   Staphylococcus aureus NEGATIVE  NEGATIVE Final   Comment:            The Xpert SA Assay (FDA     approved for NASAL specimens     in patients over 61 years of age),     is one component of     a comprehensive surveillance     program.  Test performance has     been validated by [employer] for patients greater     than or equal to 32 year old.     It is not intended     to diagnose infection nor to     guide or monitor treatment.    RADIOLOGY STUDIES/RESULTS: Dg Orthopantogram  12/19/2013   CLINICAL DATA:  Facial cellulitis  EXAM: ORTHOPANTOGRAM/PANORAMIC  COMPARISON:  CT 12/18/2013  FINDINGS: Multiple caries especially right lower molars and left upper third molar.  Periapical lucency around two right lower molars consistent with infection. This is noted on the CT.  Negative for fracture or mass.  IMPRESSION: Dental caries.  Periapical lucency around two right lower molars consistent with infection.   Electronically Signed   By: Franchot Gallo M.D.   On: 12/19/2013 09:09   Dg Chest 2 View  11/19/2013   CLINICAL DATA Chest pain.  EXAM CHEST  2 VIEW  COMPARISON None.  FINDINGS No cardiomegaly. Unremarkable mediastinal contours. No infiltrate, edema, effusion, or pneumothorax. There is an apparent 22 mm long lucency in the posterior right fifth rib. This could be related to a true expansile bone lesion or summation of lung markings.  IMPRESSION 1. No active cardiopulmonary disease. 2. Questionable expansile bone lesion in the posterior right fifth rib.  Recommend dedicated rib series for confirmation (would like to exclude CT radiation due to young age).  SIGNATURE  Electronically Signed   By: Jorje Guild M.D.   On: 11/19/2013 13:27   Dg Ribs Unilateral Right  11/19/2013   CLINICAL DATA Lower rib pain, fall 3 weeks ago  EXAM RIGHT RIBS - 2 VIEW  COMPARISON 11/19/2013  FINDINGS Radiopaque marker over the right lower ribs. No displaced fracture or definite focal rib abnormality. Inferior right ribs appear intact. Right lung clear. No effusion or pneumothorax. No chest wall asymmetry or subcutaneous emphysema.  IMPRESSION No acute osseous finding  SIGNATURE  Electronically Signed   By: Daryll Brod M.D.   On: 11/19/2013 16:57   Ct Maxillofacial W/cm  12/18/2013   CLINICAL DATA:  Right-sided facial pain and swelling.  EXAM: CT MAXILLOFACIAL WITH CONTRAST  TECHNIQUE: Multidetector CT imaging of the maxillofacial structures was performed with intravenous contrast. Multiplanar CT image reconstructions were also generated. A small metallic BB was placed on the right temple in order to reliably differentiate right from left.  CONTRAST:  44m OMNIPAQUE IOHEXOL 300 MG/ML  SOLN  COMPARISON:  None.  FINDINGS: There is a focal area of subcutaneous soft tissue swelling/ edema involving the right facial area overlying the right mandible and maxilla. No discrete drainable soft tissue abscess.  There is extensive dental disease with dental caries and right-sided periapical lucency involving the mandibular molars. No abnormality in the floor of the mouth. The tongue base is normal.  The parotid and submandibular glands are normal. The epiglottis is normal. The paraglottic fat planes are maintained. The piriform sinus and vallecular air spaces are normal.  Small scattered bilateral neck nodes but no mass or adenopathy.  The visualized portion of the  brain is unremarkable. The paranasal sinuses and mastoid air cells are clear. Globes are intact.  IMPRESSION: Right facial  cellulitis without discrete abscess. This is due to periapical abscess involving the right mandibular molar.   Electronically Signed   By: Kalman Jewels M.D.   On: 12/18/2013 18:03    Verlee Monte, MD  [facility] Pager:[phone]  If 7PM-7AM, please contact night-coverage [email or web address] Password [email or web address] 12/26/2013, 2:05 PM   LOS: 8 days

## 2013-12-26 NOTE — Progress Notes (Signed)
PT Cancellation Note  Patient Details Name: [name] MRN: [id number] DOB: 07-Aug-1971   Cancelled Treatment:    Reason Eval/Treat Not Completed: Pain limiting ability to participate.    Shary Decamp Yolonda Purtle 12/26/2013, 3:05 PM  [employer] PT [phone]

## 2013-12-26 NOTE — Progress Notes (Signed)
ANTIBIOTIC CONSULT NOTE - Follow Up Consult  Pharmacy Consult for vancomycin and Unasyn Indication: facial cellutlits  No Known Allergies  Patient Measurements: Height: 5\' 5"  (165.1 cm) Weight: 141 lb 13.5 oz (64.34 kg) IBW/kg (Calculated) : 57   Vital Signs: Temp: 98.9 F (37.2 C) (04/16 1345) Temp src: Oral (04/16 1345) BP: 99/65 mmHg (04/16 1345) Pulse Rate: 83 (04/16 1345) Intake/Output from previous day: 04/15 0701 - 04/16 0700 In: 2752.8 [I.V.:1552.8; IV Piggyback:1200] Out: -  Intake/Output from this shift: Total I/O In: 600 [P.O.:600] Out: -   Labs:  Recent Labs  12/24/13 0708 12/25/13 0634 12/26/13 0343  WBC 18.9* 17.5*  --   HGB 8.1* 8.4*  --   PLT 332 379  --   CREATININE 0.47* 0.46* 0.66   Estimated Creatinine Clearance: 82.4 ml/min (by C-G formula based on Cr of 0.66).  Recent Labs  12/25/13 0630  VANCOTROUGH 13.7     Microbiology: Recent Results (from the past 720 hour(s))  CULTURE, BLOOD (ROUTINE X 2)     Status: None   Collection Time    12/18/13  7:48 PM      Result Value Ref Range Status   Specimen Description BLOOD ARM LEFT   Final   Special Requests BOTTLES DRAWN AEROBIC AND ANAEROBIC 10CC   Final   Culture  Setup Time     Final   Value: 12/19/2013 00:57     Performed at [facility]   Culture     Final   Value: NO GROWTH 5 DAYS     Performed at [facility]   Report Status 12/25/2013 FINAL   Final  CULTURE, BLOOD (ROUTINE X 2)     Status: None   Collection Time    12/18/13  7:53 PM      Result Value Ref Range Status   Specimen Description BLOOD HAND LEFT   Final   Special Requests BOTTLES DRAWN AEROBIC ONLY 10CC   Final   Culture  Setup Time     Final   Value: 12/19/2013 00:58     Performed at [facility]   Culture     Final   Value: NO GROWTH 5 DAYS     Performed at [facility]   Report Status 12/25/2013 FINAL   Final  GRAM STAIN     Status: None   Collection Time    12/24/13  8:23 AM       Result Value Ref Range Status   Specimen Description CSF   Final   Special Requests CSF FLUID NO 4 8CC   Final   Gram Stain     Final   Value: CSF CYTOSPIN     NO WBC SEEN     NO ORGANISMS SEEN   Report Status 12/24/2013 FINAL   Final  FUNGAL STAIN     Status: None   Collection Time    12/24/13  8:23 AM      Result Value Ref Range Status   Specimen Description CSF   Final   Special Requests CSF FLUID NO 4 8CC   Final   Fungal Smear     Final   Value: NO YEAST OR FUNGAL ELEMENTS SEEN     Performed at [facility]   Report Status 12/25/2013 FINAL   Final  CSF CULTURE     Status: None   Collection Time    12/24/13  8:23 AM      Result Value Ref Range Status  Specimen Description CSF   Final   Special Requests CSF FLUID TUBE 4 8CC   Final   Gram Stain     Final   Value: CYTOSPIN SLIDE NO WBC SEEN     NO ORGANISMS SEEN     Performed at [facility]     Performed at [facility]   Culture     Final   Value: NO GROWTH 2 DAYS     Performed at [facility]   Report Status PENDING   Incomplete  AFB CULTURE WITH SMEAR     Status: None   Collection Time    12/24/13  8:23 AM      Result Value Ref Range Status   Specimen Description CSF   Final   Special Requests CSF FLUID NO 4 8CC   Final   ACID FAST SMEAR     Final   Value: NO ACID FAST BACILLI SEEN     Performed at [facility]   Culture     Final   Value: CULTURE WILL BE EXAMINED FOR 6 WEEKS BEFORE ISSUING A FINAL REPORT     Performed at [facility]   Report Status PENDING   Incomplete  SURGICAL PCR SCREEN     Status: None   Collection Time    12/24/13  2:09 PM      Result Value Ref Range Status   MRSA, PCR NEGATIVE  NEGATIVE Final   Staphylococcus aureus NEGATIVE  NEGATIVE Final   Comment:            The Xpert SA Assay (FDA     approved for NASAL specimens     in patients over 17 years of age),     is one component of     a comprehensive surveillance     program.   Test performance has     been validated by [employer] for patients greater     than or equal to 75 year old.     It is not intended     to diagnose infection nor to     guide or monitor treatment.   Assessment: 43 yo female with facial cellulitis who is s/p extraction of 25 teeth. Renal function remains stable with CrCl ~22mL/min. Her WBC has been trending down and swelling is improved. Blood Cultures are negative and no growth  Has been seen on CSF cultures yet. After discussion with Dr. Hartford Poli, to change to PO antibiotics.  Goal of Therapy:  Eradication of infection  Plan:  1. Discontinue vancomycin and Unasyn 2. Start Augmentin 875/125 PO BID 3. Pharmacy to sign off as no further renal adjustment anticipated. Please re-consult if needed.  Thanks, Almeta Monas. Tatiana Courter, PharmD, BCPS Clinical Pharmacist Pager: [phone] 12/26/2013 2:02 PM

## 2013-12-26 NOTE — Progress Notes (Signed)
Subjective: Continues to have burning of her legs, states taht her fingers used to burn as well, but now they have improved.   Exam: Filed Vitals:   12/26/13 0517  BP: 101/70  Pulse: 85  Temp: 98.2 F (36.8 C)  Resp: 16   Gen: In bed, NAD MS: Awake, Alert PH:QNETU, EOMI Motor: 5/5 bilateral upper extremities. She has marked limitation due to pain in her lower extremities, but appears able to move all muscle groups well, fine testing of motor ability is difficult due to pain/edema.  Sensory: decreased to temperature and vibration to the knees, intact to temp in thigh, intact to vibration adn temperature in the fingertips.  YWS:BBJXFF in the legs. 1+ at biceps.   Workup thus far has included  LP Prot 34 Gluc 60 WBC 0 RBC 1 On CSF:   PCCA/ANNA   LGI1   CASPR2   VGKC   VGCC   Aquaporin-4  B12 394 Folate wnl TSH WNL SPEP, no M-spike, but possible faint band. Will check IFX ANA negative ESR 5 RF negative HIV negative    Impression: 43 yo F with progressive painful neuropathy. Currently awaiting pathology of sural nerve biopsy. 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 She is now over two months since onset of symptoms and has begun to have some improvement(no tingling in her fingertips). She is currently admitted for sepsis and I do not think that immune suppression is indicated at this time given length of time since symptoms and comorbid infection.   Other possibilities are wide ranging and include copper deficiency(though not on zinc supplements so unlikely), Paraneoplastic syndrome, Sjogrens syndrome, amyloid, or other idiopathic neuropathy.   Recommendations: 1) Await sural nerve biopsy(will be 7-10  business days).  2) Send serum Immunofixation given SPEP ambiguity 3) SSA, SSB 4) Serum copper.    Roland Rack, MD [facility] [phone]  If 7pm- 7am, please page neurology on call as listed in [place].

## 2013-12-26 NOTE — Progress Notes (Signed)
Patient has follow up with Dr. [name] of [place] on 01/07/14 at [place] in the AM.   Etta Quill PA-C [facility] [phone]  12/26/2013, 2:05 PM

## 2013-12-26 NOTE — Evaluation (Signed)
Occupational Therapy Evaluation Patient Details Name: [name] MRN: [id number] DOB: Apr 17, 1971 Today's Date: 12/26/2013    History of Present Illness Pt adm with facial cellulitis and bil lower extremity neuropathy. Work up of neuropathy in progress.  No significant PMHx listed in chart.    Clinical Impression   Pt demos decline in function with ADLs and ADL mobility safety with decreased strength, endurance and balance. Pt would benefit from acute OT services to address impairments to increase level of function and safety    Follow Up Recommendations  SNF;Supervision/Assistance - 24 hour    Equipment Recommendations  3 in 1 bedside comode;Tub/shower bench    Recommendations for Other Services       Precautions / Restrictions Precautions Precautions: Fall Precaution Comments: h/o falls Restrictions Weight Bearing Restrictions: No      Mobility Bed Mobility Overal bed mobility: Modified Independent Bed Mobility: Supine to Sit     Supine to sit: Modified independent (Device/Increase time);HOB elevated Sit to supine: Min guard   General bed mobility comments: uses railing to sit EOB, min guard A with LEs back onto bed  Transfers Overall transfer level: Needs assistance   Transfers: Sit to/from Stand;Stand Pivot Transfers Sit to Stand: Min assist Stand pivot transfers: Min assist       General transfer comment: Min assist to stand from bed - BSC.  Verbal cues for safe hand placement.     Balance Overall balance assessment: Needs assistance Sitting-balance support: No upper extremity supported;Feet supported Sitting balance-Leahy Scale: Good     Standing balance support: Bilateral upper extremity supported;During functional activity;Single extremity supported Standing balance-Leahy Scale: Poor                              ADL Overall ADL's : Needs assistance/impaired     Grooming: Wash/dry hands;Wash/dry face;Supervision/safety;Set  up;Sitting   Upper Body Bathing: Set up;Supervision/ safety;Sitting   Lower Body Bathing: Moderate assistance;Sitting/lateral leans   Upper Body Dressing : Set up;Supervision/safety;Sitting   Lower Body Dressing: Moderate assistance;Sitting/lateral leans   Toilet Transfer: Minimal assistance;Stand-pivot;BSC;Cueing for safety Toilet Transfer Details (indicate cue type and reason): Min assist to stand from bed - BSC.  Verbal cues for safe hand placement.  Toileting- Clothing Manipulation and Hygiene: Minimal assistance;Sitting/lateral lean       Functional mobility during ADLs: Minimal assistance;Cueing for safety General ADL Comments: pt provided with education and visuals of DME for bathroom for home use     Vision  does not wear glasses                              Pertinent Vitals/Pain 8/10 pain in B feet an and mouth. VSS     Hand Dominance Right   Extremity/Trunk Assessment Upper Extremity Assessment Upper Extremity Assessment: Generalized weakness   Lower Extremity Assessment Lower Extremity Assessment: Defer to PT evaluation   Cervical / Trunk Assessment Cervical / Trunk Assessment: Normal   Communication Communication Communication: No difficulties   Cognition Arousal/Alertness: Awake/alert Behavior During Therapy: WFL for tasks assessed/performed Overall Cognitive Status: Within Functional Limits for tasks assessed                     General Comments   Pt pleasant, cooperative and motivated                 Home Living Family/patient expects to be discharged to::  Private residence Living Arrangements: Children Available Help at Discharge: Family;Available PRN/intermittently Type of Home: House Home Access: Stairs to enter [employer] of Steps: 10 Entrance Stairs-Rails: Right;Left Home Layout: Two level;Able to live on main level with bedroom/bathroom     Bathroom Shower/Tub: Tub/shower unit;Walk-in Therapist, nutritional: Standard     Home Equipment: Wheelchair - manual          Prior Functioning/Environment Level of Independence: Needs assistance  Gait / Transfers Assistance Needed: uses w/c, mostly bedbound ADL's / Homemaking Assistance Needed: assist daily for dressing and bathing LB        OT Diagnosis: Generalized weakness   OT Problem List: Decreased strength;Decreased knowledge of use of DME or AE;Decreased activity tolerance;Pain;Impaired balance (sitting and/or standing)   OT Treatment/Interventions: Self-care/ADL training;Patient/family education;Therapeutic exercise;Neuromuscular education;Balance training;Therapeutic activities;DME and/or AE instruction    OT Goals(Current goals can be found in the care plan section) Acute Rehab OT Goals Patient Stated Goal: " I need to walk again to keep up with my 43 year old " OT Goal Formulation: With patient Time For Goal Achievement: 01/02/14 Potential to Achieve Goals: Good ADL Goals Pt Will Perform Lower Body Bathing: with min assist;with min guard assist;sitting/lateral leans;sit to/from stand Pt Will Perform Lower Body Dressing: with min assist;with min guard assist;sitting/lateral leans;sit to/from stand Pt Will Transfer to Toilet: with min guard assist;with supervision;bedside commode;grab bars;ambulating;regular height toilet Pt Will Perform Toileting - Clothing Manipulation and hygiene: with min guard assist;sitting/lateral leans;sit to/from stand Pt Will Perform Tub/Shower Transfer: with min assist;with min guard assist;tub bench  OT Frequency: Min 2X/week   Barriers to D/C: Decreased caregiver support                        End of Session Equipment Utilized During Treatment: Gait belt;Other (comment) (BSC)  Activity Tolerance: Patient tolerated treatment well Patient left: in bed;with call bell/phone within reach;with bed alarm set   Time: 1002-1039 OT Time Calculation (min): 37 min Charges:  OT  General Charges $OT Visit: 1 Procedure OT Evaluation $Initial OT Evaluation Tier I: 1 Procedure OT Treatments $Self Care/Home Management : 8-22 mins $Therapeutic Activity: 8-22 mins G-Codes:    Mosetta Putt 2013-12-27, 12:22 PM

## 2013-12-27 LAB — CSF CULTURE W GRAM STAIN
Culture: NO GROWTH
Gram Stain: NONE SEEN

## 2013-12-27 LAB — COMPREHENSIVE METABOLIC PANEL
ALT: 11 U/L (ref 0–35)
AST: 66 U/L — ABNORMAL HIGH (ref 0–37)
Albumin: 2.4 g/dL — ABNORMAL LOW (ref 3.5–5.2)
Alkaline Phosphatase: 168 U/L — ABNORMAL HIGH (ref 39–117)
BUN: 5 mg/dL — ABNORMAL LOW (ref 6–23)
CO2: 26 mEq/L (ref 19–32)
Calcium: 8.4 mg/dL (ref 8.4–10.5)
Chloride: 96 mEq/L (ref 96–112)
Creatinine, Ser: 0.57 mg/dL (ref 0.50–1.10)
GFR calc Af Amer: >90 mL/min (ref >90)
GFR calc non Af Amer: >90 mL/min (ref >90)
Glucose, Bld: 88 mg/dL (ref 70–99)
Potassium: 3.6 mEq/L — ABNORMAL LOW (ref 3.7–5.3)
Sodium: 136 mEq/L — ABNORMAL LOW (ref 137–147)
Total Bilirubin: 0.4 mg/dL (ref 0.3–1.2)
Total Protein: 6.3 g/dL (ref 6.0–8.3)

## 2013-12-27 LAB — NEURONAL NUCLEAR AUTOABS(IFA), IGG-BLD

## 2013-12-27 LAB — RI ANTIBODY, WESTERN BLOT: RI Ab, WBlot: NEGATIVE

## 2013-12-27 LAB — SJOGRENS SYNDROME-A EXTRACTABLE NUCLEAR ANTIBODY: SSA (Ro) (ENA) Antibody, IgG: <1

## 2013-12-27 LAB — SJOGRENS SYNDROME-B EXTRACTABLE NUCLEAR ANTIBODY: SSB (La) (ENA) Antibody, IgG: <1

## 2013-12-27 LAB — HU ANTIBODY, WESTERN BLOT: HU Ab, WBlot: NEGATIVE

## 2013-12-27 LAB — YO ANTIBODY, WESTERN BLOT: Yo ab, western blot: NEGATIVE

## 2013-12-27 MED ORDER — FOLIC ACID 1 MG PO TABS: 1.0000 mg | ORAL_TABLET | Freq: Every day | ORAL | Status: DC

## 2013-12-27 MED ORDER — AMOXICILLIN-POT CLAVULANATE 875-125 MG PO TABS: 1.0000 | ORAL_TABLET | Freq: Two times a day (BID) | ORAL | Status: DC

## 2013-12-27 MED ORDER — BOOST / RESOURCE BREEZE PO LIQD: 1.0000 | Freq: Two times a day (BID) | ORAL | Status: DC

## 2013-12-27 MED ORDER — HYDROCODONE-ACETAMINOPHEN 5-325 MG PO TABS: 2.0000 | ORAL_TABLET | ORAL | Status: DC | PRN

## 2013-12-27 MED ORDER — GABAPENTIN 300 MG PO CAPS: 300.0000 mg | ORAL_CAPSULE | Freq: Three times a day (TID) | ORAL | Status: DC

## 2013-12-27 MED ORDER — POTASSIUM CHLORIDE CRYS ER 20 MEQ PO TBCR
60.0000 meq | EXTENDED_RELEASE_TABLET | Freq: Once | ORAL | Status: AC
  Administered 2013-12-27: 60 meq via ORAL
  Filled 2013-12-27: qty 3

## 2013-12-27 MED ORDER — THIAMINE HCL 100 MG PO TABS: 100.0000 mg | ORAL_TABLET | Freq: Every day | ORAL | Status: DC

## 2013-12-27 NOTE — Progress Notes (Signed)
Patient was discharged to a nursing home by MD order; discharged instructions  review and sent to facility with care notes and prescriptions; IV DIC; no acute distress at this time; facility ([facility]) was called and report was given to the nurse Anderson Malta); patient will be transported to the facilty by her mother.

## 2013-12-27 NOTE — Discharge Summary (Signed)
Physician Discharge Summary  [name] ION:[id number] DOB: 1971-01-19 DOA: 12/18/2013  PCP: No PCP Per Patient  Admit date: 12/18/2013 Discharge date: 12/27/2013  Time spent: 40 minutes  Recommendations for Outpatient Follow-up:  1. Followup with nursing home MD.  Discharge Diagnoses:  Active Problems:   Lower extremity pain, bilateral   Peripheral neuropathy   Discharge Condition: Stable  Diet recommendation: Regular  Filed Weights   12/25/13 0355 12/26/13 0612 12/27/13 0431  Weight: 61.1 kg (134 lb 11.2 oz) 64.34 kg (141 lb 13.5 oz) 60.9 kg (134 lb 4.2 oz)    History of present illness:  [name] is a 43 y.o. female  With no significant past medical history that comes in for lower extremity pain this started 2 months prior to admission progressively getting worst as its ascending. She relates some lower extremity weakness. No recent viral infections, no fever chills nausea vomiting or diarrhea. No new medications. No recent travel history. She relates she's had unprotected sex with only one partner. No recent travel history. No IV drug abuse Or drug use at all. She also started developing some numbness in her upper extremities phalanges. And some numbness in the mouth. She said that 3 days prior to admission she also developed like redness in her face and the small pimple directed causing facial swelling redness and no tenderness.  Hospital Course:   Facial cellulitis- Right  - Admitted, and started on vancomycin on 4/8, since foci is likely dental caries, added Unasyn for anaerobic coverage on 4/9-continue. Clinically much improved, is afebrile, leukocytosis persistent but stable. Significantly decreased in the right sided swelling and erythema, now has to small indurated areas in the right upper lip, that is intermittently draining purulent material.  -The facial cellulitis is related to dental abscess, dental medicine Dr. Enrique Sack was consulted. -Her teeth extracted on  12/26/2011 (all of her teeth.  -On vancomycin and Unasyn, switch to oral Augmentin.  -On discharge oral Augmentin for 5 more days, followup with Dr. Enrique Sack as outpatient  Bilateral lower extremity neuropathy  -Patient presented with bilateral neuropathy, she had difficulty with walking and lower extremity pain -Neurology consulted, workup in progress. Differential diagnosis includes alcohol neuropathy and Guillain-Barre Syndrome -Etiology unknown, MRI of the cervical/thoracic spine negative for acute abnormalities -ANA negative. RPR nonreactive, HIV nonreactive, ESR only at 5, rheumatoid factor negative.  -Vitamin B12 at 381. Heavy metal screen is pending, SPEP inconclusive, IFE pending -Patient will be discharged to nursing home to followup with Dr. [name] as outpatient. -Sural nerve biopsy done and results are pending. -LP done by neurology on 4/14, results are pending.   Hyponatremia  -Likely secondary to dehydration, resolved with IV fluids.   EtOH use  -No signs of withdrawal, place on Ativan per protocol, along with MVI, thiamine and folate.  -Claims to drink on weekends and one or twice in the weekdays   Anemia  -Anemia panel consistent with Anemia of Chronic disease,likely worsened by acute illness  -No indication for transfusion at this time  -continue to monitor Hb periodically   B/L lower extremity swelling  -?secondary to hypoalbuminemia-has hx of ETOH use-but LFT's not that deranged.  -UA negative for Proteinuria, liver ultrasound showed inhomogeneous liver texture likely steatosis. -2-D echocardiogram showed LVEF of 60-65% with grade 1 diastolic dysfunction..  -Has negative Doppler ultrasounds. Treated with Lasix in the hospital. None discharge in the hospital.  Hypertension  - Likely worsened by peripheral neuropathy/pain  - We'll continue to monitor and optimize antihypertensive regimen.  Procedures:  Sural nerve biopsy, pending  path.  Consultations:  Neurology  Neurosurgery  Discharge Exam: Filed Vitals:   12/27/13 0431  BP: 144/93  Pulse: 83  Temp: 98.2 F (36.8 C)  Resp: 16   General: Alert and awake, oriented x3, not in any acute distress. HEENT: anicteric sclera, pupils reactive to light and accommodation, EOMI CVS: S1-S2 clear, no murmur rubs or gallops Chest: clear to auscultation bilaterally, no wheezing, rales or rhonchi Abdomen: soft nontender, nondistended, normal bowel sounds, no organomegaly Extremities: no cyanosis, clubbing or edema noted bilaterally Neuro: Cranial nerves II-XII intact, no focal neurological deficits  Discharge Instructions You were cared for by a hospitalist during your hospital stay. If you have any questions about your discharge medications or the care you received while you were in the hospital after you are discharged, you can call the unit and asked to speak with the hospitalist on call if the hospitalist that took care of you is not available. Once you are discharged, your primary care physician will handle any further medical issues. Please note that NO REFILLS for any discharge medications will be authorized once you are discharged, as it is imperative that you return to your primary care physician (or establish a relationship with a primary care physician if you do not have one) for your aftercare needs so that they can reassess your need for medications and monitor your lab values.  Discharge Orders   Future Appointments Provider Department Dept Phone   01/07/2014 9:30 AM Kathrynn Ducking, MD [facility] [phone]   Future Orders Complete By Expires   Increase activity slowly  As directed        Medication List    STOP taking these medications       Gabapentin (PHN) 300 MG Tabs     traMADol 50 MG tablet  Commonly known as:  ULTRAM      TAKE these medications       amoxicillin-clavulanate 875-125 MG per tablet  Commonly known as:   AUGMENTIN  Take 1 tablet by mouth every 12 (twelve) hours.     feeding supplement (RESOURCE BREEZE) Liqd  Take 1 Container by mouth 2 (two) times daily between meals.     folic acid 1 MG tablet  Commonly known as:  FOLVITE  Take 1 tablet (1 mg total) by mouth daily.     gabapentin 300 MG capsule  Commonly known as:  NEURONTIN  Take 1 capsule (300 mg total) by mouth 3 (three) times daily.     HYDROcodone-acetaminophen 5-325 MG per tablet  Commonly known as:  NORCO/VICODIN  Take 2 tablets by mouth every 4 (four) hours as needed for moderate pain.     multivitamin with minerals tablet  Take 1 tablet by mouth daily.     thiamine 100 MG tablet  Take 1 tablet (100 mg total) by mouth daily.       No Known Allergies     Follow-up Information   Follow up with Lenn Cal, DDS. Schedule an appointment as soon as possible for a visit on 01/06/2014. (For suture removal)    Specialty:  Dentistry   Contact information:   [place] [address] [phone]       Follow up with Lenor Coffin, MD On 01/07/2014. (follow up for neuropathy  appt at 0930 be there 30 minutes early. )    Specialty:  Neurology   Contact information:   [place]  [phone] [phone]        The results of significant diagnostics from this hospitalization (including imaging, microbiology, ancillary and laboratory) are listed below for reference.    Significant Diagnostic Studies: Dg Orthopantogram  12/19/2013   CLINICAL DATA:  Facial cellulitis  EXAM: ORTHOPANTOGRAM/PANORAMIC  COMPARISON:  CT 12/18/2013  FINDINGS: Multiple caries especially right lower molars and left upper third molar.  Periapical lucency around two right lower molars consistent with infection. This is noted on the CT.  Negative for fracture or mass.  IMPRESSION: Dental caries.  Periapical lucency around two right lower molars consistent with infection.   Electronically Signed   By:  Franchot Gallo M.D.   On: 12/19/2013 09:09   Mr Jeri Cos WG Contrast  12/22/2013   CLINICAL DATA:  43 year old female with history of heavy smoking and alcohol use presenting with history of gradual but worsening paresthesias and weakness in lower extremities over the past 2 months. Right face cellulitis.  EXAM: MRI HEAD WITHOUT AND WITH CONTRAST  TECHNIQUE: Multiplanar, multiecho pulse sequences of the brain and surrounding structures were obtained without and with intravenous contrast.  CONTRAST:  39mL MULTIHANCE GADOBENATE DIMEGLUMINE 529 MG/ML IV SOLN  COMPARISON:  12/20/2013 cervical spine MR. 12/18/2013 facial CT. 07/19/2006 head CT. No comparison brain MR.  FINDINGS: Exam is motion degraded.  No acute infarct.  No intracranial hemorrhage.  No intracranial mass or abnormal enhancement.  No hydrocephalus.  No white matter abnormality noted to suggest presence of underlying demyelinating process.  Decreased signal intensity of bone marrow may be related to patient's anemia.  Cervical medullary junction, pituitary region, pineal region and orbital structures unremarkable.  Patient's right facial cellulitis not as well delineated on the present MR. Scattered lymph nodes in the neck are noted. These may be reactive in origin.  IMPRESSION: Exam is motion degraded.  No acute infarct.  No intracranial mass or abnormal enhancement.  No white matter abnormality noted to suggest presence of underlying demyelinating process.  Decreased signal intensity of bone marrow may be related to patient's anemia.  Patient's right facial cellulitis not as well delineated on the present MR. Scattered lymph nodes in the neck are noted. These may be reactive in origin.   Electronically Signed   By: Chauncey Cruel M.D.   On: 12/22/2013 10:03   Mr Cervical Spine W Wo Contrast  12/20/2013   CLINICAL DATA:  Lower extremity pain this started 2 months prior to admission progressively getting worse. She relates some lower extremity  weakness. No recent viral infections, no fever chills nausea vomiting or diarrhea. She also started developing some numbness in her upper extremities phalanges. And some numbness in the mouth. She said that 3 days prior to admission she also developed redness in her face and the small pimple directed causing facial swelling redness and no tenderness.  EXAM: MRI CERVICAL SPINE WITHOUT AND WITH CONTRAST  TECHNIQUE: Multiplanar and multiecho pulse sequences of the cervical spine, to include the craniocervical junction and cervicothoracic junction, were obtained according to standard protocol without and with intravenous contrast.  CONTRAST:  59mL MULTIHANCE GADOBENATE DIMEGLUMINE 529 MG/ML IV SOLN  COMPARISON:  None.  FINDINGS: There is significant patient motion degrading image quality limiting evaluation.  The cervical cord is normal in size and signal. Vertebral body heights are maintained. The disc spaces are preserved. The cervical spine is normal in lordotic alignment. No static listhesis. Bone marrow signal is normal. Cerebellar tonsils are normal in position. No areas of abnormal enhancement.  C2-3: No significant disc bulge. No neural foraminal stenosis. No central canal stenosis.  C3-4: No significant disc bulge. No neural foraminal stenosis. No central canal stenosis.  C4-5: No significant disc bulge. No neural foraminal stenosis. No central canal stenosis.  C5-6: No significant disc bulge. No neural foraminal stenosis. No central canal stenosis.  C6-7: No significant disc bulge. No neural foraminal stenosis. No central canal stenosis.  C7-T1: No significant disc bulge. No neural foraminal stenosis. No central canal stenosis.  IMPRESSION: Normal MRI of the cervical spine.   Electronically Signed   By: Kathreen Devoid   On: 12/20/2013 12:42   Mr Thoracic Spine W Wo Contrast  12/20/2013   CLINICAL DATA:  Lower extremity pain this started 2 months prior to admission progressively getting worse. She relates some  lower extremity weakness. No recent viral infections, no fever chills nausea vomiting or diarrhea. She also started developing some numbness in her upper extremities phalanges. And some numbness in the mouth. She said that 3 days prior to admission she also developed redness in her face and the small pimple directed causing facial swelling redness and no tenderness.  EXAM: MRI THORACIC SPINE WITHOUT AND WITH CONTRAST  TECHNIQUE: Multiplanar and multiecho pulse sequences of the thoracic spine were obtained without and with intravenous contrast.  CONTRAST:  67mL MULTIHANCE GADOBENATE DIMEGLUMINE 529 MG/ML IV SOLN  COMPARISON:  None.  FINDINGS: The vertebral body heights are maintained. The alignment is anatomic. There is no vertebral body compression fracture. The thoracic spinal cord is normal in size and signal. The conus medullaris terminates at T12. There are no areas of abnormal enhancement. The disc spaces are maintained. There is no disc protrusion or foraminal stenosis. There is no central canal stenosis. There is a mild right paracentral broad-based disc bulge at T8-9.  IMPRESSION: 1. The thoracic spinal cord is normal in size and signal. No abnormal enhancement 2. Mild right paracentral disc bulge at T8-9.   Electronically Signed   By: Kathreen Devoid   On: 12/20/2013 12:46   US Abdomen Limited  12/23/2013   CLINICAL DATA:  Lower extremity swelling. The patient is currently undergoing treatment for facial cellulitis.  EXAM: US ABDOMEN LIMITED - RIGHT UPPER QUADRANT  COMPARISON:  No similar prior exam is available at this institution for comparison or on [facility] PACS.  FINDINGS: Gallbladder:  Tiny dependent mobile stones or sludge noted without gallbladder wall thickening, pericholecystic fluid, or sonographic Murphy sign.  Common bile duct:  Diameter: 2 mm  Liver:  Diffusely inhomogeneously echogenic without focal mass.  Small amount of ascites.  IMPRESSION: Tiny gallstones or sludge without other sonographic  evidence for acute cholecystitis.  Inhomogeneous liver echotexture, globally increased, likely due to steatosis. Given the degree of inhomogeneity, if further evaluation for possible underlying hepatic pathology is needed, outpatient nonemergent abdominal MRI with contrast could be helpful.   Electronically Signed   By: Conchita Paris M.D.   On: 12/23/2013 08:59   Ct Maxillofacial W/cm  12/18/2013   CLINICAL DATA:  Right-sided facial pain and swelling.  EXAM: CT MAXILLOFACIAL WITH CONTRAST  TECHNIQUE: Multidetector CT imaging of the maxillofacial structures was performed with intravenous contrast. Multiplanar CT image reconstructions were also generated. A small metallic BB was placed on the right temple in order to reliably differentiate right from left.  CONTRAST:  70mL OMNIPAQUE IOHEXOL 300 MG/ML  SOLN  COMPARISON:  None.  FINDINGS: There is a focal area of subcutaneous soft tissue swelling/ edema involving the right facial  area overlying the right mandible and maxilla. No discrete drainable soft tissue abscess.  There is extensive dental disease with dental caries and right-sided periapical lucency involving the mandibular molars. No abnormality in the floor of the mouth. The tongue base is normal.  The parotid and submandibular glands are normal. The epiglottis is normal. The paraglottic fat planes are maintained. The piriform sinus and vallecular air spaces are normal.  Small scattered bilateral neck nodes but no mass or adenopathy.  The visualized portion of the brain is unremarkable. The paranasal sinuses and mastoid air cells are clear. Globes are intact.  IMPRESSION: Right facial cellulitis without discrete abscess. This is due to periapical abscess involving the right mandibular molar.   Electronically Signed   By: Kalman Jewels M.D.   On: 12/18/2013 18:03    Microbiology: Recent Results (from the past 240 hour(s))  CULTURE, BLOOD (ROUTINE X 2)     Status: None   Collection Time    12/18/13   7:48 PM      Result Value Ref Range Status   Specimen Description BLOOD ARM LEFT   Final   Special Requests BOTTLES DRAWN AEROBIC AND ANAEROBIC 10CC   Final   Culture  Setup Time     Final   Value: 12/19/2013 00:57     Performed at [facility]   Culture     Final   Value: NO GROWTH 5 DAYS     Performed at [facility]   Report Status 12/25/2013 FINAL   Final  CULTURE, BLOOD (ROUTINE X 2)     Status: None   Collection Time    12/18/13  7:53 PM      Result Value Ref Range Status   Specimen Description BLOOD HAND LEFT   Final   Special Requests BOTTLES DRAWN AEROBIC ONLY 10CC   Final   Culture  Setup Time     Final   Value: 12/19/2013 00:58     Performed at [facility]   Culture     Final   Value: NO GROWTH 5 DAYS     Performed at [facility]   Report Status 12/25/2013 FINAL   Final  GRAM STAIN     Status: None   Collection Time    12/24/13  8:23 AM      Result Value Ref Range Status   Specimen Description CSF   Final   Special Requests CSF FLUID NO 4 8CC   Final   Gram Stain     Final   Value: CSF CYTOSPIN     NO WBC SEEN     NO ORGANISMS SEEN   Report Status 12/24/2013 FINAL   Final  FUNGAL STAIN     Status: None   Collection Time    12/24/13  8:23 AM      Result Value Ref Range Status   Specimen Description CSF   Final   Special Requests CSF FLUID NO 4 8CC   Final   Fungal Smear     Final   Value: NO YEAST OR FUNGAL ELEMENTS SEEN     Performed at [facility]   Report Status 12/25/2013 FINAL   Final  CSF CULTURE     Status: None   Collection Time    12/24/13  8:23 AM      Result Value Ref Range Status   Specimen Description CSF   Final   Special Requests CSF FLUID TUBE 4 8CC   Final  Gram Stain     Final   Value: CYTOSPIN SLIDE NO WBC SEEN     NO ORGANISMS SEEN     Performed at [facility]     Performed at [facility]   Culture     Final   Value: NO GROWTH 3 DAYS     Performed at [facility]   Report Status 12/27/2013 FINAL   Final  AFB CULTURE WITH SMEAR     Status: None   Collection Time    12/24/13  8:23 AM      Result Value Ref Range Status   Specimen Description CSF   Final   Special Requests CSF FLUID NO 4 8CC   Final   ACID FAST SMEAR     Final   Value: NO ACID FAST BACILLI SEEN     Performed at [facility]   Culture     Final   Value: CULTURE WILL BE EXAMINED FOR 6 WEEKS BEFORE ISSUING A FINAL REPORT     Performed at [facility]   Report Status PENDING   Incomplete  SURGICAL PCR SCREEN     Status: None   Collection Time    12/24/13  2:09 PM      Result Value Ref Range Status   MRSA, PCR NEGATIVE  NEGATIVE Final   Staphylococcus aureus NEGATIVE  NEGATIVE Final   Comment:            The Xpert SA Assay (FDA     approved for NASAL specimens     in patients over 66 years of age),     is one component of     a comprehensive surveillance     program.  Test performance has     been validated by [employer] for patients greater     than or equal to 82 year old.     It is not intended     to diagnose infection nor to     guide or monitor treatment.     Labs: Basic Metabolic Panel:  Recent Labs Lab 12/20/13 1238  12/23/13 0715 12/24/13 0708 12/25/13 0300 12/26/13 0343 12/27/13 0628  NA  --   < > 139 138 139 139 136*  K  --   < > 3.5* 3.7 3.6* 4.3 3.6*  CL  --   < > 97 97 98 101 96  CO2  --   < > $R'25 25 27 26 26  'jm$ GLUCOSE  --   < > 93 108* 87 87 88  BUN  --   < > <3* 3* 3* 4* 5*  CREATININE  --   < > 0.47* 0.47* 0.46* 0.66 0.57  CALCIUM  --   < > 8.4 8.4 8.8 8.4 8.4  MG 1.6  --   --   --   --   --   --   < > = values in this interval not displayed. Liver Function Tests:  Recent Labs Lab 12/23/13 0715 12/27/13 0628  AST 80* 66*  ALT 13 11  ALKPHOS 197* 168*  BILITOT 0.4 0.4  PROT 6.2 6.3  ALBUMIN 2.7* 2.4*   No results found for this basename: LIPASE, AMYLASE,  in the last 168 hours No results found for this  basename: AMMONIA,  in the last 168 hours CBC:  Recent Labs Lab 12/21/13 0750 12/22/13 0650 12/23/13 0715 12/24/13 0708 12/25/13 0634  WBC 13.7* 16.7* 19.5* 18.9*  17.5*  HGB 8.1* 7.8* 8.0* 8.1* 8.4*  HCT 24.1* 23.7* 23.9* 23.9* 26.0*  MCV 101.3* 102.2* 102.1* 102.1* 103.6*  PLT 304 309 333 332 379   Cardiac Enzymes: No results found for this basename: CKTOTAL, CKMB, CKMBINDEX, TROPONINI,  in the last 168 hours BNP: BNP (last 3 results)  Recent Labs  12/23/13 0715  PROBNP 4017.0*   CBG: No results found for this basename: GLUCAP,  in the last 168 hours     Signed:  Verlee Monte  [facility] 12/27/2013, 11:08 AM

## 2013-12-27 NOTE — Progress Notes (Signed)
Physical Therapy Treatment Patient Details Name: Priyana Mccarey MRN: [id number] DOB: [birth year] Today's Date: 12/27/2013    History of Present Illness Pt adm with facial cellulitis and bil lower extremity neuropathy. Work up of neuropathy in progress.  No significant PMHx listed in chart. Multiple tooth extractions 4/15    PT Comments    Pt with improved mobility despite edema and pain. Pt able to ambulate out of room today although still very limited with reliance on RW and min assist for balance and stability. Pt continues to need to watch feet with gait due to neuropathy. Pt encouraged to continue OOB and HEp daily. Will follow.    Follow Up Recommendations  SNF     Equipment Recommendations  Rolling walker with 5" wheels    Recommendations for Other Services       Precautions / Restrictions Precautions Precautions: Fall Precaution Comments: painful and edematous bil LE    Mobility  Bed Mobility Overal bed mobility: Modified Independent             General bed mobility comments: increased time due to painful bil LE  Transfers       Sit to Stand: Min assist         General transfer comment: cues for hand placement and sequence with assist to stand and guarding to safely lower to surface  Ambulation/Gait Ambulation/Gait assistance: Min assist Ambulation Distance (Feet): 70 Feet Assistive device: Rolling walker (2 wheeled) Gait Pattern/deviations: Step-through pattern;Decreased stride length;Trunk flexed;Narrow base of support;Antalgic   Gait velocity interpretation: Below normal speed for age/gender General Gait Details: very antalgic gait with cues for posture and position in RW, pt able to don bari socks for gait and unable to look up despite cues due to wanting to make sure she can see her foot placement   Stairs            Wheelchair Mobility    Modified Rankin (Stroke Patients Only)       Balance                                     Cognition Arousal/Alertness: Awake/alert Behavior During Therapy: WFL for tasks assessed/performed Overall Cognitive Status: Within Functional Limits for tasks assessed                      Exercises General Exercises - Lower Extremity Long Arc Quad: AROM;Both;Seated;15 reps Hip Flexion/Marching: AROM;Both;Seated;15 reps    General Comments        Pertinent Vitals/Pain 10/10 bil LE     Home Living                      Prior Function            PT Goals (current goals can now be found in the care plan section) Progress towards PT goals: Goals met and updated - see care plan    Frequency       PT Plan Current plan remains appropriate    Co-evaluation             End of Session Equipment Utilized During Treatment: Gait belt Activity Tolerance: Patient limited by pain Patient left: in chair;with call bell/phone within reach     Time: 1002-1029 PT Time Calculation (min): 27 min  Charges:  $Gait Training: 8-22 mins $Therapeutic Exercise: 8-22 mins  G Codes:      Aamari Strawderman B Raquan Iannone 12/27/2013, 12:01 PM Elwyn Reach, [place]

## 2013-12-27 NOTE — Clinical Social Work Placement (Signed)
Clinical Social Work Department CLINICAL SOCIAL WORK PLACEMENT NOTE 12/27/2013  Patient:  [name], [name]  Account Number:  [id number] Admit date:  12/18/2013  Clinical Social Worker:  Kemper Durie, [place]  Date/time:  12/19/2013 03:40 PM  Clinical Social Work is seeking post-discharge placement for this patient at the following level of care:   SKILLED NURSING   (*CSW will update this form in Epic as items are completed)   12/19/2013  Patient/family provided with [facility] Department of Clinical Social Work's list of facilities offering this level of care within the geographic area requested by the patient (or if unable, by the patient's family).  12/19/2013  Patient/family informed of their freedom to choose among providers that offer the needed level of care, that participate in Medicare, Medicaid or managed care program needed by the patient, have an available bed and are willing to accept the patient.  12/19/2013  Patient/family informed of [facility]' ownership interest in [facility], as well as of the fact that they are under no obligation to receive care at this facility.  PASARR submitted to EDS on 12/19/2013 PASARR number received from EDS on 12/19/2013  FL2 transmitted to all facilities in geographic area requested by pt/family on  12/19/2013 FL2 transmitted to all facilities within larger geographic area on 12/19/2013  Patient informed that his/her managed care company has contracts with or will negotiate with  certain facilities, including the following:     Patient/family informed of bed offers received:  12/27/2013 Patient chooses bed at [facility] Physician recommends and patient chooses bed at    Patient to be transferred to [facility] on  12/27/2013 Patient to be transferred to facility by Mother's personal vehicle  The following physician request were entered in Epic:   Additional Comments: Per MD patient ready to DC to [facility]. RN, patient, mother, and facility notified of DC. Mother Tyna Jaksch has completed paperwork with facility. RN given number for report. DC packet given to patient's mother with instructions to give to facility upon arrival. CSW signing off at this time.    Liz Beach MSW, [place], [place], [phone]

## 2013-12-28 ENCOUNTER — Emergency Department: Payer: Self-pay | Admitting: Internal Medicine

## 2013-12-28 LAB — HEAVY METALS, RANDOM URINE
Arsenic Random, Urine: 5 mcg/g creat (ref <51)
Creatinine Random, Urine: 136.6 mg/dL (ref 20.0–320.0)
Lead Random, Urine: 1 mcg/g creat (ref <10)

## 2013-12-30 LAB — HEAVY METALS, RANDOM URINE
Arsenic Random, Urine: 5 mcg/g creat (ref <51)
Creatinine Random, Urine: 136.6 mg/dL (ref 20.0–320.0)
Lead Random, Urine: 1 mcg/g creat (ref <10)

## 2013-12-30 LAB — IMMUNOFIXATION ELECTROPHORESIS
IgA: 482 mg/dL — ABNORMAL HIGH (ref 69–380)
IgG (Immunoglobin G), Serum: 893 mg/dL (ref 690–1700)
IgM, Serum: 120 mg/dL (ref 52–322)
Total Protein ELP: 5.7 g/dL — ABNORMAL LOW (ref 6.0–8.3)

## 2013-12-30 LAB — ATHENA MAIL

## 2014-01-01 ENCOUNTER — Telehealth: Payer: Self-pay

## 2014-01-01 ENCOUNTER — Telehealth: Payer: Self-pay | Admitting: Internal Medicine

## 2014-01-01 MED ORDER — GABAPENTIN 300 MG PO CAPS: 300.0000 mg | ORAL_CAPSULE | Freq: Three times a day (TID) | ORAL | Status: DC

## 2014-01-01 NOTE — Telephone Encounter (Signed)
Pt's mom calling because says there was a mistake on Gabapentin script and has not been able to fill med due to this.  Depending on time of day script can be sent to [facility] pharm or pt can pick up script to take to another pharmacy. Please f/u with with pt regarding which she prefers.

## 2014-01-01 NOTE — Telephone Encounter (Signed)
Patient mom called needing refill on her gabapentin Spoke with Dr Doyle Askew and she approved the refill Sent to [facility] on [address]

## 2014-01-02 LAB — MISCELLANEOUS TEST

## 2014-01-06 ENCOUNTER — Telehealth: Payer: Self-pay | Admitting: Internal Medicine

## 2014-01-06 ENCOUNTER — Encounter (INDEPENDENT_AMBULATORY_CARE_PROVIDER_SITE_OTHER): Payer: Self-pay

## 2014-01-06 ENCOUNTER — Telehealth: Payer: Self-pay | Admitting: Emergency Medicine

## 2014-01-06 ENCOUNTER — Ambulatory Visit (HOSPITAL_COMMUNITY): Payer: Self-pay | Admitting: Dentistry

## 2014-01-06 ENCOUNTER — Encounter (HOSPITAL_COMMUNITY): Payer: Self-pay | Admitting: Dentistry

## 2014-01-06 VITALS — BP 161/98 | HR 79 | Temp 98.4°F

## 2014-01-06 DIAGNOSIS — K000 Anodontia: Secondary | ICD-10-CM

## 2014-01-06 DIAGNOSIS — K0820 Unspecified atrophy of edentulous alveolar ridge: Secondary | ICD-10-CM

## 2014-01-06 DIAGNOSIS — K08109 Complete loss of teeth, unspecified cause, unspecified class: Secondary | ICD-10-CM

## 2014-01-06 DIAGNOSIS — K08199 Complete loss of teeth due to other specified cause, unspecified class: Secondary | ICD-10-CM

## 2014-01-06 LAB — MISCELLANEOUS TEST: Miscellaneous Test: 87288

## 2014-01-06 LAB — ATHENA MAIL

## 2014-01-06 NOTE — Telephone Encounter (Signed)
Pt's mom came in to request a refill of her pain and HTN medication. Pt states that she is completely out of her pain medication and really needs that medication today she is in a lot of pain. Please contact pt as soon as possible

## 2014-01-06 NOTE — Progress Notes (Signed)
POST OPERATIVE NOTE:  01/06/2014 [name] [id number]  VITALS: BP 161/98  Pulse 79  Temp(Src) 98.4 F (36.9 C) (Oral)   LABS:  Lab Results  Component Value Date   WBC 17.5* 12/25/2013   HGB 8.4* 12/25/2013   HCT 26.0* 12/25/2013   MCV 103.6* 12/25/2013   PLT 379 12/25/2013   BMET    Component Value Date/Time   NA 136* 12/27/2013 0628   K 3.6* 12/27/2013 0628   CL 96 12/27/2013 0628   CO2 26 12/27/2013 0628   GLUCOSE 88 12/27/2013 0628   BUN 5* 12/27/2013 0628   CREATININE 0.57 12/27/2013 0628   CALCIUM 8.4 12/27/2013 0628   GFRNONAA >90 12/27/2013 0628   GFRAA >90 12/27/2013 0628    Lab Results  Component Value Date   INR 1.06 12/19/2013   No results found for this basename: PTT     [name] is status post extraction of remaining teeth with alveoloplasty and pre-prosthetic surgery as indicated in the operating room on 12/25/2013.  SUBJECTIVE:  The patient has minimal discomfort. Some of the stitches are still present and loose.  EXAM: There is no sign of infection, heme, or ooze. Sutures are loosely intact. Generalized primary closure is noted. There is atrophy of edentulous alveolar ridges.  PROCEDURE: The patient was given a chlorhexidine gluconate rinse for 30 seconds. Sutures were then removed without complication. Patient tolerated the procedure well.  ASSESSMENT: Post operative course is consistent with dental procedures performed in the OR on 12/25/13. The patient is now edentulous.  PLAN: 1. Continue salt water rinses as needed to aid healing. 2. Maintain soft diet to avoid trauma to her mouth. Use protein supplements like Boost or Ensure Plus as previously directed. 3. Follow up with a dentist of her choice for fabrication of upper and lower complete dentures after adequate healing. Price quote was provided for upper and lower complete dentures.  4. The patient is to call dental medicine if she obtains Medicaid to allow for obtaining prior approval for upper  and lower complete dentures. 5. Return to clinic as scheduled in one month. Call if problems arise before then.  Lenn Cal, DDS

## 2014-01-06 NOTE — Telephone Encounter (Signed)
Pt called in requesting pain medication for neuropathy in both legs. Pt states she takes narcotics and Gabapentin for pain Pt given narcotic policy. Pt also has a f/u 01/15/14 with Dr. Doyle Askew

## 2014-01-06 NOTE — Patient Instructions (Addendum)
PLAN: 1. Continue salt water rinses as needed to aid healing. 2. Maintain soft diet to avoid trauma to her mouth. Use protein supplements like Boost or Ensure Plus as previously directed. 3. Follow up with a dentist of her choice for fabrication of upper and lower complete dentures after adequate healing. Price quote was provided for upper and lower complete dentures.  4. The patient is to call dental medicine if she obtains Medicaid to allow for obtaining prior approval for upper and lower complete dentures. 5. Return to clinic as scheduled in one month. Call if problems arise before then.  Lenn Cal, DDS

## 2014-01-06 NOTE — Telephone Encounter (Signed)
Pt's mom called returning the nurses phone call about her daughter medication refill. Please contact pt

## 2014-01-07 ENCOUNTER — Encounter: Payer: Self-pay | Admitting: Neurology

## 2014-01-07 ENCOUNTER — Ambulatory Visit (INDEPENDENT_AMBULATORY_CARE_PROVIDER_SITE_OTHER): Payer: Medicaid Other | Admitting: Neurology

## 2014-01-07 ENCOUNTER — Encounter (INDEPENDENT_AMBULATORY_CARE_PROVIDER_SITE_OTHER): Payer: Self-pay

## 2014-01-07 VITALS — BP 150/94 | HR 94 | Wt 112.0 lb

## 2014-01-07 DIAGNOSIS — G609 Hereditary and idiopathic neuropathy, unspecified: Secondary | ICD-10-CM

## 2014-01-07 DIAGNOSIS — G629 Polyneuropathy, unspecified: Secondary | ICD-10-CM

## 2014-01-07 MED ORDER — DULOXETINE HCL 30 MG PO CPEP: ORAL_CAPSULE | ORAL | Status: DC

## 2014-01-07 NOTE — Patient Instructions (Signed)

## 2014-01-07 NOTE — Progress Notes (Signed)
Reason for visit:  Peripheral neuropathy  [name] is a 43 y.o. female  History of present illness:  [name] is a 43 year old right-handed white female with a history of alcohol abuse and malnutrition. The patient presented 2 months ago with a relatively sudden onset of swelling in arms and legs, and severe numbness, dysesthesias of the legs and hands, some sensations around the mouth. The patient denied any visual or auditory changes. She indicates that she was drinking heavily prior to the hospitalization. She had developed a dental abscess, and she stopped eating, but she continued consuming alcohol. There was a 15 pound weight loss around the time of onset of symptoms. She was admitted to the hospital with gait instability, and at one point, the patient was unable to ambulate whatsoever. Severe pain was noted with light touch on the skin of the feet. She indicates that she cannot feel the bladder, and she has to void on a regular basis to prevent incontinence. With rehabilitation, she has improved somewhat, but she continues to have severe pain in the feet, swelling in the ankles, and she requires a walker for ambulation. She underwent an extensive workup in the hospital that included a sural nerve biopsy, blood work up, spinal tap, evaluation for paraneoplastic antibodies. Vitamin B12 level was unremarkable. She was treated with thiamine and multivitamins. She comes to this office for further evaluation. She has not undergone nerve conduction studies.  Past Medical History  Diagnosis Date  . Hypertension   . Strachan's syndrome   . History of alcoholism   . Malnutrition     Past Surgical History  Procedure Laterality Date  . Tubal ligation    . Sural nerve bx Right 12/24/2013    Procedure: SURAL NERVE BIOPSY;  Surgeon: Kristeen Miss, MD;  Location: [facility];  Service: Neurosurgery;  Laterality: Right;  . Multiple extractions with alveoloplasty N/A 12/25/2013    Procedure:  Extraction of tooth #'s 2,3,4,5,6,8,9,10,11,12,13,14,15,20,21,22,23,24,25,26,27, [phone] with alveoloplasty and bilateral mandibular tori reductions;  Surgeon: Lenn Cal, DDS;  Location: [facility];  Service: Oral Surgery;  Laterality: N/A;    Family History  Problem Relation Age of Onset  . Diabetes Father   . Diabetes Maternal Aunt   . Heart disease Maternal Aunt   . Diabetes Maternal Uncle   . Heart disease Maternal Uncle   . Drug abuse Maternal Grandmother   . Cancer Maternal Grandmother     intestinal  . Hypertension Maternal Grandmother   . Cancer Maternal Grandfather     lung  . Hypertension Mother   . Raynaud syndrome Mother   . Hypertension Paternal Grandfather     Social history:  reports that she has been smoking.  She has never used smokeless tobacco. She reports that she does not drink alcohol or use illicit drugs.  Medications:  Current Outpatient Prescriptions on File Prior to Visit  Medication Sig Dispense Refill  . feeding supplement, RESOURCE BREEZE, (RESOURCE BREEZE) LIQD Take 1 Container by mouth 2 (two) times daily between meals.    0  . folic acid (FOLVITE) 1 MG tablet Take 1 tablet (1 mg total) by mouth daily.      Marland Kitchen gabapentin (NEURONTIN) 300 MG capsule Take 1 capsule (300 mg total) by mouth 3 (three) times daily.  90 capsule  2  . Multiple Vitamins-Minerals (MULTIVITAMIN WITH MINERALS) tablet Take 1 tablet by mouth daily.      Marland Kitchen thiamine 100 MG tablet Take 1 tablet (100 mg total)  by mouth daily.      Marland Kitchen HYDROcodone-acetaminophen (NORCO/VICODIN) 5-325 MG per tablet Take 2 tablets by mouth every 4 (four) hours as needed for moderate pain.  20 tablet  0   No current facility-administered medications on file prior to visit.     No Known Allergies  ROS:  Out of a complete 14 system review of symptoms, the patient complains only of the following symptoms, and all other reviewed systems are negative.  Weight loss, fatigue Swelling in the  legs Shortness of breath Incontinence of bowel Urination problems Feeling cold, increased thirst Numbness, weakness, dizziness Insomnia, decreased energy  Blood pressure 150/94, pulse 94, weight 112 lb (50.803 kg).  Physical Exam  General: The patient is alert and cooperative at the time of the examination.  Eyes: Pupils are equal, round, and reactive to light. Discs are flat bilaterally.  Neck: The neck is supple, no carotid bruits are noted.  Respiratory: The respiratory examination is clear.  Cardiovascular: The cardiovascular examination reveals a regular rate and rhythm, no obvious murmurs or rubs are noted.  Skin: Extremities are with 2+ edema at the ankles bilaterally.  Neurologic Exam  Mental status: The patient is alert and oriented x 3 at the time of the examination. The patient has apparent normal recent and remote memory, with an apparently normal attention span and concentration ability.  Cranial nerves: Facial symmetry is present. There is good sensation of the face to pinprick and soft touch bilaterally. The strength of the facial muscles and the muscles to head turning and shoulder shrug are normal bilaterally. Speech is well enunciated, no aphasia or dysarthria is noted. Extraocular movements are full. Visual fields are full. The tongue is midline, and the patient has symmetric elevation of the soft palate. No obvious hearing deficits are noted.  Motor: The motor testing reveals 5 over 5 strength of all 4 extremities. Good symmetric motor tone is noted throughout.  Sensory: Sensory testing is intact to pinprick, soft touch, vibration sensation, and position sense on the upper extremities. With the lower extremities, a pinprick sensory deficit is noted up to the knees bilaterally. Severe impairment of vibration sensation and position sense are noted in the feet. Severe dysesthesias with light touch on the skin of the feet and ankles is noted. No evidence of extinction  is noted.  Coordination: Cerebellar testing reveals good finger-nose-finger bilaterally. Slight ataxia seen with heel-to-shin bilaterally. Titubations are noted while sitting.  Gait and station: Gait is wide-based, unsteady. Tandem gait was not attempted. The patient walks with a walker. Romberg is positive. No drift is seen.  Reflexes: Deep tendon reflexes are symmetric, but are absent bilaterally. Toes are downgoing bilaterally.   Assessment/Plan:  1. 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 Gait disorder secondary to #1  3. History of alcoholism  4. Malnutrition  The patient has evidence of hepatic dysfunction and malnutrition with low albumin levels. The patient likely has some neuropathic injury from alcohol abuse, but she likely developed a nutritional peripheral neuropathy secondary to vitamin B complex vitamin deficiency with a syndrome that simulates the Strachan syndrome. The patient did not have hearing changes or visual changes, however. She does have a sensory ataxia, areflexia, and severe neuropathic pain. Controlled substances should be avoided in this patient given the history of alcoholism. She will be placed on Cymbalta, and she is to continue the thiamine and multivitamins. She will follow up for nerve conduction studies on all 4 extremities,  and EMG evaluation of one lower extremity. She will followup for the EMG evaluation.  Jill Alexanders MD 01/07/2014 6:58 PM  [facility] [address] [place], [place] [address]  Phone [phone] Fax [phone]

## 2014-01-09 LAB — COPPER, SERUM: Copper: 127 ug/dL (ref 72–166)

## 2014-01-11 LAB — SPECIMEN STATUS REPORT

## 2014-01-13 NOTE — Progress Notes (Signed)
Quick Note:  Called and shared unremarkable results with patient and she verbalized understanding ______

## 2014-01-15 ENCOUNTER — Encounter: Payer: Self-pay | Admitting: Internal Medicine

## 2014-01-15 ENCOUNTER — Ambulatory Visit: Payer: No Typology Code available for payment source | Attending: Internal Medicine | Admitting: Internal Medicine

## 2014-01-15 VITALS — BP 147/96 | HR 87 | Temp 98.2°F | Resp 14

## 2014-01-15 DIAGNOSIS — E538 Deficiency of other specified B group vitamins: Secondary | ICD-10-CM | POA: Insufficient documentation

## 2014-01-15 DIAGNOSIS — Z79899 Other long term (current) drug therapy: Secondary | ICD-10-CM | POA: Insufficient documentation

## 2014-01-15 DIAGNOSIS — I10 Essential (primary) hypertension: Secondary | ICD-10-CM | POA: Insufficient documentation

## 2014-01-15 DIAGNOSIS — F17200 Nicotine dependence, unspecified, uncomplicated: Secondary | ICD-10-CM | POA: Insufficient documentation

## 2014-01-15 DIAGNOSIS — E46 Unspecified protein-calorie malnutrition: Secondary | ICD-10-CM | POA: Insufficient documentation

## 2014-01-15 DIAGNOSIS — F1011 Alcohol abuse, in remission: Secondary | ICD-10-CM | POA: Insufficient documentation

## 2014-01-15 DIAGNOSIS — G621 Alcoholic polyneuropathy: Secondary | ICD-10-CM | POA: Insufficient documentation

## 2014-01-15 LAB — MISCELLANEOUS TEST

## 2014-01-15 MED ORDER — HYDRALAZINE HCL 10 MG PO TABS: 10.0000 mg | ORAL_TABLET | Freq: Three times a day (TID) | ORAL | Status: DC | PRN

## 2014-01-15 NOTE — Progress Notes (Signed)
Pt is here following up on her b/l leg pain. Pt has stiches in her right foot that need to be removed.

## 2014-01-15 NOTE — Patient Instructions (Signed)

## 2014-01-16 LAB — BASIC METABOLIC PANEL
BUN: 6 mg/dL (ref 6–23)
CO2: 26 mEq/L (ref 19–32)
Calcium: 9.7 mg/dL (ref 8.4–10.5)
Chloride: 97 mEq/L (ref 96–112)
Creat: 0.61 mg/dL (ref 0.50–1.10)
Glucose, Bld: 82 mg/dL (ref 70–99)
Potassium: 5.2 mEq/L (ref 3.5–5.3)
Sodium: 135 mEq/L (ref 135–145)

## 2014-01-19 NOTE — Progress Notes (Signed)
Patient ID: [name], female   DOB: Jan 10, 1971, 43 y.o.   MRN: [id number]   CC: follow up   HPI: 43 y.o. female with neuropathy secondary to alcohol abuse and vitamin B 12 deficiency, presents for follow up . Reports feeling better, no chest pain or shortness of breath, no abdominal or urinary concerns.   No Known Allergies Past Medical History  Diagnosis Date  . Hypertension   . Strachan's syndrome   . History of alcoholism   . Malnutrition    Current Outpatient Prescriptions on File Prior to Visit  Medication Sig Dispense Refill  . DULoxetine (CYMBALTA) 30 MG capsule One tablet daily for one week, then take one tablet twice a day  60 capsule  3  . folic acid (FOLVITE) 1 MG tablet Take 1 tablet (1 mg total) by mouth daily.      Marland Kitchen gabapentin (NEURONTIN) 300 MG capsule Take 1 capsule (300 mg total) by mouth 3 (three) times daily.  90 capsule  2  . Multiple Vitamins-Minerals (MULTIVITAMIN WITH MINERALS) tablet Take 1 tablet by mouth daily.      Marland Kitchen thiamine 100 MG tablet Take 1 tablet (100 mg total) by mouth daily.      . feeding supplement, RESOURCE BREEZE, (RESOURCE BREEZE) LIQD Take 1 Container by mouth 2 (two) times daily between meals.    0  . HYDROcodone-acetaminophen (NORCO/VICODIN) 5-325 MG per tablet Take 2 tablets by mouth every 4 (four) hours as needed for moderate pain.  20 tablet  0   No current facility-administered medications on file prior to visit.   Family History  Problem Relation Age of Onset  . Diabetes Father   . Diabetes Maternal Aunt   . Heart disease Maternal Aunt   . Diabetes Maternal Uncle   . Heart disease Maternal Uncle   . Drug abuse Maternal Grandmother   . Cancer Maternal Grandmother     intestinal  . Hypertension Maternal Grandmother   . Cancer Maternal Grandfather     lung  . Hypertension Mother   . Raynaud syndrome Mother   . Hypertension Paternal Grandfather    History   Social History  . Marital Status: Single    Spouse Name: N/A   Number of Children: 1  . Years of Education: hs   Occupational History  . unemployed    Social History Main Topics  . Smoking status: Current Every Day Smoker -- 0.25 packs/day  . Smokeless tobacco: Never Used  . Alcohol Use: No     Comment: Former quit March  . Drug Use: No  . Sexual Activity: No   Other Topics Concern  . Not on file   Social History Narrative  . No narrative on file    Review of Systems  Constitutional: Negative for fever, chills, diaphoresis, activity change, appetite change and fatigue.  HENT: Negative for ear pain, nosebleeds, congestion, facial swelling, rhinorrhea, neck pain, neck stiffness and ear discharge.   Eyes: Negative for pain, discharge, redness, itching and visual disturbance.  Respiratory: Negative for cough, choking, chest tightness, shortness of breath, wheezing and stridor.   Cardiovascular: Negative for chest pain, palpitations and leg swelling.  Gastrointestinal: Negative for abdominal distention.  Genitourinary: Negative for dysuria, urgency, frequency, hematuria, flank pain, decreased urine volume, difficulty urinating and dyspareunia.  Musculoskeletal: Negative for back pain, joint swelling, arthralgias and gait problem.  Neurological: Negative for dizziness, tremors, seizures, syncope, facial asymmetry, speech difficulty, weakness, light-headedness, numbness and headaches.  Hematological: Negative for adenopathy. Does not  bruise/bleed easily.  Psychiatric/Behavioral: Negative for hallucinations, behavioral problems, confusion, dysphoric mood, decreased concentration and agitation.    Objective:   Filed Vitals:   01/15/14 1215  BP: 147/96  Pulse: 87  Temp: 98.2 F (36.8 C)  Resp: 14    Physical Exam  Constitutional: Appears well-developed and well-nourished. No distress.  CVS: RRR, S1/S2 +, no murmurs, no gallops, no carotid bruit.  Pulmonary: Effort and breath sounds normal, no stridor, rhonchi, wheezes, rales.  Abdominal:  Soft. BS +,  no distension, tenderness, rebound or guarding.  Musculoskeletal: Normal range of motion. No edema and no tenderness.    Lab Results  Component Value Date   WBC 17.5* 12/25/2013   HGB 8.4* 12/25/2013   HCT 26.0* 12/25/2013   MCV 103.6* 12/25/2013   PLT 379 12/25/2013   Lab Results  Component Value Date   CREATININE 0.61 01/15/2014   BUN 6 01/15/2014   NA 135 01/15/2014   K 5.2 01/15/2014   CL 97 01/15/2014   CO2 26 01/15/2014    Lab Results  Component Value Date   HGBA1C 4.8 12/20/2013   Lipid Panel  No results found for this basename: chol, trig, hdl, cholhdl, vldl, ldlcalc   Assessment and plan:   Patient Active Problem List   Diagnosis Date Noted  . Peripheral neuropathy - doing better, she continue doing PT and reports an improvement 12/24/2013  . Alcohol abuse - not drinking  12/18/2013

## 2014-01-20 ENCOUNTER — Encounter (INDEPENDENT_AMBULATORY_CARE_PROVIDER_SITE_OTHER): Payer: Medicaid Other | Admitting: Radiology

## 2014-01-20 ENCOUNTER — Ambulatory Visit (INDEPENDENT_AMBULATORY_CARE_PROVIDER_SITE_OTHER): Payer: Medicaid Other | Admitting: Neurology

## 2014-01-20 DIAGNOSIS — Z0289 Encounter for other administrative examinations: Secondary | ICD-10-CM

## 2014-01-20 DIAGNOSIS — L308 Other specified dermatitis: Principal | ICD-10-CM

## 2014-01-20 DIAGNOSIS — G629 Polyneuropathy, unspecified: Secondary | ICD-10-CM

## 2014-01-20 DIAGNOSIS — G63 Polyneuropathy in diseases classified elsewhere: Secondary | ICD-10-CM

## 2014-01-20 DIAGNOSIS — M792 Neuralgia and neuritis, unspecified: Secondary | ICD-10-CM

## 2014-01-20 DIAGNOSIS — H53009 Unspecified amblyopia, unspecified eye: Secondary | ICD-10-CM

## 2014-01-20 NOTE — Procedures (Signed)
     HISTORY:  [name] is a 43 year old patient with a history of alcoholism who developed a sudden onset painful ataxic neuropathy. 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 She is being evaluated for the neuropathy.  NERVE CONDUCTION STUDIES:  Nerve conduction studies were performed on both upper extremities. The distal motor latencies and motor amplitudes for the median and ulnar nerves were within normal limits. The F wave latencies and nerve conduction velocities for these nerves were also normal. The sensory latencies for the median and ulnar nerves were slightly prolonged for the right median nerve and for the ulnar nerves bilaterally, borderline normal for the left median nerve.  Nerve conduction studies were performed on both lower extremities. The distal motor latencies for the peroneal and posterior tibial nerves were normal, with normal motor amplitudes for these nerves. The nerve conduction velocities for the peroneal nerves were borderline normal bilaterally, and borderline normal for the left posterior tibial nerve, normal for the right posterior tibial nerve. The F wave latencies for the peroneal nerves were prolonged on the right, normal on the left, normal for the right posterior tibial nerve, slightly prolonged for the left posterior tibial nerve. The peroneal sensory latencies were absent bilaterally.  EMG STUDIES:  EMG study was performed on the right lower extremity:  The tibialis anterior muscle reveals 2 to 4K motor units with full recruitment. No fibrillations or positive waves were seen. The peroneus tertius muscle reveals 2 to 4K motor units with full recruitment. No fibrillations or positive waves were seen. The medial gastrocnemius muscle reveals 1 to 3K motor units with full recruitment. No fibrillations or positive waves were seen. The vastus lateralis muscle reveals 2 to 4K motor units with full recruitment. No fibrillations or positive waves were  seen. The iliopsoas muscle reveals 2 to 4K motor units with full recruitment. No fibrillations or positive waves were seen. The biceps femoris muscle (long head) reveals 2 to 4K motor units with full recruitment. No fibrillations or positive waves were seen. The lumbosacral paraspinal muscles were tested at 3 levels, and revealed no abnormalities of insertional activity at all 3 levels tested. There was good relaxation.   IMPRESSION:  Nerve conduction studies were performed on all 4 extremities. 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 EMG evaluation of the right lower extremity was unremarkable, without evidence of an overlying lumbosacral radiculopathy. Prognosis for ongoing recovery appears to be good.  Jill Alexanders MD 01/20/2014 11:06 AM  [facility] [address] [place], [place] [address]  Phone [phone] Fax [phone]

## 2014-01-20 NOTE — Progress Notes (Signed)
[name] is a 43 year old patient with a history of alcoholism with onset of a painful ataxic neuropathy felt related to [facility] syndrome. She has already made improvement with walking. She continues to have severe dysesthesias of the feet bilaterally. She comes in today for EMG and nerve conduction study evaluation.  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 EMG evaluation of the right lower extremity was relatively unremarkable.  This patient likely has a good prognosis for ongoing recovery.  The patient will continue her multivitamins, and try to improve her nutritional status. She will followup in 3-4 months.

## 2014-01-23 ENCOUNTER — Other Ambulatory Visit: Payer: Self-pay

## 2014-01-23 MED ORDER — GABAPENTIN 300 MG PO CAPS: 300.0000 mg | ORAL_CAPSULE | Freq: Three times a day (TID) | ORAL | Status: DC

## 2014-01-30 ENCOUNTER — Encounter: Payer: Self-pay | Admitting: Neurology

## 2014-02-05 LAB — AFB CULTURE WITH SMEAR (NOT AT ARMC): Acid Fast Smear: NONE SEEN

## 2014-02-10 ENCOUNTER — Ambulatory Visit (HOSPITAL_COMMUNITY): Payer: Medicaid - Dental | Admitting: Dentistry

## 2014-02-10 ENCOUNTER — Encounter (HOSPITAL_COMMUNITY): Payer: Self-pay | Admitting: Dentistry

## 2014-02-10 ENCOUNTER — Encounter (INDEPENDENT_AMBULATORY_CARE_PROVIDER_SITE_OTHER): Payer: Self-pay

## 2014-02-10 VITALS — BP 196/130 | HR 81 | Temp 97.6°F

## 2014-02-10 DIAGNOSIS — I10 Essential (primary) hypertension: Secondary | ICD-10-CM

## 2014-02-10 DIAGNOSIS — K000 Anodontia: Principal | ICD-10-CM

## 2014-02-10 DIAGNOSIS — K08109 Complete loss of teeth, unspecified cause, unspecified class: Secondary | ICD-10-CM

## 2014-02-10 DIAGNOSIS — K0820 Unspecified atrophy of edentulous alveolar ridge: Secondary | ICD-10-CM

## 2014-02-10 DIAGNOSIS — Z463 Encounter for fitting and adjustment of dental prosthetic device: Secondary | ICD-10-CM

## 2014-02-10 NOTE — Patient Instructions (Addendum)
The patient was instructed to followup with Dr. Doyle Askew or the emergency department concerning her elevated blood pressure today. Patient will not be seen for further dental treatment until blood pressure is controlled. The patient is to return to clinic for continued upper and lower complete denture fabrication once medically stable.  Dr. Enrique Sack

## 2014-02-10 NOTE — Progress Notes (Signed)
02/10/2014  Patient:            [name] Date of Birth:  October 24, 1970 MRN:                [id number]  BP 171/125  Pulse 81  Temp(Src) 97.6 F (36.4 C) (Oral)  LMP 10/18/2013 Elevated Blood pressure noted. Patient denies any headache, dizziness, or untoward effects of elevated blood pressure.  [name] presents for start of upper and lower denture fabrication. Exam: Patient is edentulous. Discussed procedures involved in upper and lower denture fabrication and prognosis for successful ability to wear dentures. Price for dentures confirmed.  Patient agrees to proceed with upper and lower denture fabrication. Procedure:  Upper and lower denture primary impressions in alginate. Lab pour. To [facility] for upper and lower denture custom tray fabrication. Plan: Elevated blood pressure was noted.  BP retaken 30 minutes later: BP:  196/130  P: 81 Patient indicates that she was instructed to use hydralazine only as needed for elevated blood pressure. Patient, however, no longer has a functional blood pressure monitor at home. Patient has elevated blood pressure today and will not be seen for further appointments until patient is medically cleared by Dr. Doyle Askew. The patient was instructed to follow up with Dr. Doyle Askew today for evaluation or go to the emergency room. The patient was dismissed in stable condition.  Lenn Cal, DDS

## 2014-02-12 ENCOUNTER — Telehealth: Payer: Self-pay | Admitting: *Deleted

## 2014-02-12 NOTE — Telephone Encounter (Signed)
Patient calling stating she is still having pain in her legs. Patient is on Gabapentin 300 mg three times a day. Patient is also needing her blood pressure checked for dental surgery. Patient given an appointment for 02/14/2014 at 2:15 PM. Alverda Skeans, RN

## 2014-02-14 ENCOUNTER — Ambulatory Visit: Payer: No Typology Code available for payment source | Attending: Internal Medicine | Admitting: *Deleted

## 2014-02-14 VITALS — BP 138/92 | HR 90 | Resp 16

## 2014-02-14 DIAGNOSIS — I10 Essential (primary) hypertension: Secondary | ICD-10-CM

## 2014-02-14 NOTE — Progress Notes (Signed)
Patient here for BP check. Patient was just started on BP medication Monday.

## 2014-02-18 ENCOUNTER — Ambulatory Visit (HOSPITAL_COMMUNITY): Payer: Medicaid - Dental | Admitting: Dentistry

## 2014-02-18 ENCOUNTER — Encounter (HOSPITAL_COMMUNITY): Payer: Self-pay | Admitting: Dentistry

## 2014-02-18 VITALS — BP 163/107 | HR 73 | Temp 98.0°F

## 2014-02-18 DIAGNOSIS — I10 Essential (primary) hypertension: Secondary | ICD-10-CM

## 2014-02-18 DIAGNOSIS — Z463 Encounter for fitting and adjustment of dental prosthetic device: Secondary | ICD-10-CM

## 2014-02-18 DIAGNOSIS — K000 Anodontia: Principal | ICD-10-CM

## 2014-02-18 DIAGNOSIS — K08109 Complete loss of teeth, unspecified cause, unspecified class: Secondary | ICD-10-CM

## 2014-02-18 DIAGNOSIS — K0820 Unspecified atrophy of edentulous alveolar ridge: Secondary | ICD-10-CM

## 2014-02-18 NOTE — Patient Instructions (Signed)
Return to clinic for continuation of fabrication of the upper lower complete dentures. Patient to maintain monitoring her blood pressure and taking her blood pressure medication as indicated. Dr. Enrique Sack

## 2014-02-18 NOTE — Progress Notes (Signed)
02/18/2014  Patient:            [name] Date of Birth:  12-03-1970 MRN:                [id number]  BP 163/107  Pulse 73  Temp(Src) 98 F (36.7 C) (Oral)  [name] presents for continued upper and lower complete denture fabrication. The patient still has persistent hypertension at this appointment. Patient did followup for evaluation blood pressure last Friday. Blood pressure was elevated but no further medication was prescribed at that time. The patient currently denies having any problems with headache, dizziness, or symptoms of elevated blood pressure today. Patient agrees to proceed with treatment as planned.  Procedure:  Upper and lower border molding and final impressions in Aquasil. 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 Patient tolerated procedure well. To [facility] for custom baseplates with rims. Return to clinic for upper and lower complete denture jaw relations.  Lenn Cal, DDS

## 2014-02-26 ENCOUNTER — Telehealth: Payer: Self-pay | Admitting: *Deleted

## 2014-02-26 NOTE — Telephone Encounter (Signed)
I called the pharmacy again.  The pharmacist said there was an error on their part, but they have fixed it and will correct the Rx.  I called the patient back.  She is aware.  She will follow up with the pharmacy and call us back if needed.

## 2014-02-26 NOTE — Telephone Encounter (Signed)
I called the pharmacy back five separate times, and each time I followed the prompts to speak to the staff, no one would answer the line.  I let it ring over 20 times each call.  I called the patient back.  She said she thinks they all leave for lunch and asked that I call them back after 2pm

## 2014-03-06 ENCOUNTER — Encounter (HOSPITAL_COMMUNITY): Payer: Self-pay | Admitting: Dentistry

## 2014-03-06 ENCOUNTER — Ambulatory Visit (HOSPITAL_COMMUNITY): Payer: Medicaid - Dental | Admitting: Dentistry

## 2014-03-06 VITALS — BP 121/74 | HR 75 | Temp 97.8°F

## 2014-03-06 DIAGNOSIS — K0820 Unspecified atrophy of edentulous alveolar ridge: Secondary | ICD-10-CM

## 2014-03-06 DIAGNOSIS — K000 Anodontia: Principal | ICD-10-CM

## 2014-03-06 DIAGNOSIS — K005 Hereditary disturbances in tooth structure, not elsewhere classified: Secondary | ICD-10-CM

## 2014-03-06 DIAGNOSIS — Z463 Encounter for fitting and adjustment of dental prosthetic device: Secondary | ICD-10-CM

## 2014-03-06 DIAGNOSIS — K08109 Complete loss of teeth, unspecified cause, unspecified class: Secondary | ICD-10-CM

## 2014-03-06 NOTE — Progress Notes (Signed)
03/06/2014  Patient:            [name] Date of Birth:  10-Oct-1970 MRN:                [id number]  BP 121/74  Pulse 75  Temp(Src) 97.8 F (36.6 C) (Oral)  KENNIDY LAMKE presents for continued denture fabrication. Procedure:  Upper and lower denture Jaw relations with aluwax bite registration. Very difficult secondary to difficulty in obtaining centric relation bite registration in a reproducible manner. Patient agrees to tooth selection of 25G, R and 10 degree posteriors to match with Portrait A2 shade. Patient also understands that use of a 22E, H set up may be required due to space constraints. Patient tolerated procedure well. RTC for denture wax try in.   Lenn Cal, DDS

## 2014-03-06 NOTE — Patient Instructions (Signed)
To clinic as scheduled for continued upper and lower complete denture fabrication. Dr. Enrique Sack

## 2014-03-12 ENCOUNTER — Telehealth: Payer: Self-pay | Admitting: *Deleted

## 2014-03-12 NOTE — Telephone Encounter (Signed)
Patient calling stating she is still having pain in her legs at night. Patient is on Gabapentin 300 mg three times a day. Patient would like to know if there is something else she can take. Please advise.

## 2014-03-13 ENCOUNTER — Telehealth: Payer: Self-pay | Admitting: *Deleted

## 2014-03-13 NOTE — Telephone Encounter (Signed)
Patient following up in regards to call from yesterday. Informed patient as soon as MD responds we will cal her back.

## 2014-03-18 ENCOUNTER — Encounter (HOSPITAL_COMMUNITY): Payer: Medicaid - Dental | Admitting: Dentistry

## 2014-03-19 ENCOUNTER — Ambulatory Visit (HOSPITAL_COMMUNITY): Payer: Medicaid - Dental | Admitting: Dentistry

## 2014-03-19 ENCOUNTER — Encounter (HOSPITAL_COMMUNITY): Payer: Self-pay | Admitting: Dentistry

## 2014-03-19 ENCOUNTER — Ambulatory Visit: Payer: Medicaid Other | Attending: Internal Medicine | Admitting: *Deleted

## 2014-03-19 VITALS — BP 150/92 | HR 62 | Temp 97.8°F

## 2014-03-19 VITALS — BP 161/92 | HR 58 | Temp 97.9°F | Resp 16 | Ht 65.0 in | Wt 95.1 lb

## 2014-03-19 DIAGNOSIS — K0820 Unspecified atrophy of edentulous alveolar ridge: Secondary | ICD-10-CM

## 2014-03-19 DIAGNOSIS — M264 Malocclusion, unspecified: Secondary | ICD-10-CM

## 2014-03-19 DIAGNOSIS — K000 Anodontia: Principal | ICD-10-CM

## 2014-03-19 DIAGNOSIS — I10 Essential (primary) hypertension: Secondary | ICD-10-CM

## 2014-03-19 DIAGNOSIS — K08109 Complete loss of teeth, unspecified cause, unspecified class: Secondary | ICD-10-CM

## 2014-03-19 DIAGNOSIS — Z463 Encounter for fitting and adjustment of dental prosthetic device: Secondary | ICD-10-CM

## 2014-03-19 MED ORDER — HYDRALAZINE HCL 10 MG PO TABS: 10.0000 mg | ORAL_TABLET | Freq: Three times a day (TID) | ORAL | Status: DC | PRN

## 2014-03-19 NOTE — Progress Notes (Unsigned)
Patient here for suture removal. Patient has had sutures in since 12/2013. Patient states she was told the stiches would dissolve on there own but they did not. Wound has healed with no signs of infection. Sutures removed and dressing placed over wound on lower right leg.

## 2014-03-19 NOTE — Progress Notes (Signed)
03/19/2014  Patient:            [name] Date of Birth:  [birth year] MRN:                [id number]  BP 150/92  Pulse 62  Temp(Src) 97.8 F (36.6 C) (Oral)  YOLETTE HASTINGS presents for continued upper and lower denture fabrication. Procedure:  Upper and lower denture wax tryin. Patient accepts esthetics, phonetics, fit and function. We discussed setting a smaller maxillary anterior tooth but patient and mother felt that the teeth are acceptable as is. Patient agrees to process "as is" in [place] [address]. Patient to RTC for  upper and lower denture insertion.  Lenn Cal, DDS

## 2014-03-19 NOTE — Patient Instructions (Signed)
Return to clinic as scheduled for continued upper and lower complete denture fabrication. Dr. Kulinski 

## 2014-03-19 NOTE — Patient Instructions (Signed)

## 2014-03-27 ENCOUNTER — Ambulatory Visit (HOSPITAL_COMMUNITY): Payer: Medicaid - Dental | Admitting: Dentistry

## 2014-03-27 ENCOUNTER — Encounter (HOSPITAL_COMMUNITY): Payer: Self-pay | Admitting: Dentistry

## 2014-03-27 VITALS — BP 148/87 | HR 65 | Temp 98.1°F

## 2014-03-27 DIAGNOSIS — Z463 Encounter for fitting and adjustment of dental prosthetic device: Secondary | ICD-10-CM

## 2014-03-27 DIAGNOSIS — K08109 Complete loss of teeth, unspecified cause, unspecified class: Secondary | ICD-10-CM

## 2014-03-27 DIAGNOSIS — K0820 Unspecified atrophy of edentulous alveolar ridge: Secondary | ICD-10-CM

## 2014-03-27 DIAGNOSIS — K000 Anodontia: Principal | ICD-10-CM

## 2014-03-27 DIAGNOSIS — M264 Malocclusion, unspecified: Secondary | ICD-10-CM

## 2014-03-27 NOTE — Progress Notes (Signed)
03/27/2014  Patient:            [name] Date of Birth:  20-Nov-1970 MRN:                [id number]  BP 148/87  Pulse 65  Temp(Src) 98.1 F (36.7 C) (Oral)  [name] presents for insertion of upper and lower complete dentures. Procedure: Pressure indicating paste was applied to the dentures. Adjustments were made as needed. [place] Thick PIP applied to the denture borders. Adjustments made as needed. [place]). Occlusion evaluated and adjustments made as needed for Centric Relation and protrusive strokes. Patient with tendency to protrude to and past end to end position. Patient was instructed on finding maximum intercuspation position. Good esthetics, phonetics, fit, and function noted. Patient accepts results. Post op instructions provided in written and verbal formats on use and care of dentures. Gave patient denture brush and cup. Patient to keep dentures out if sore spots develop. Use salt water rinses as needed to aid healing. Return to clinic as scheduled for denture adjustment.  Call if problems arise before then. Patient dismissed in stable condition.  Lenn Cal, DDS

## 2014-03-27 NOTE — Patient Instructions (Signed)
Instructions for Denture Use and Care  Congratulations, you are on the way to oral rehabilitation!  You have just received a new set of complete or partial dentures.  These prostheses will help to improve both your appearance and chewing ability.  These instructions will help you get adjusted to your dentures as well as care for them properly.  Please read these instructions carefully and completely as soon as you get home.  If you or your caregiver have any questions please notify the [facility] at [phone].  HOW YOUR DENTURES LOOK AND FEEL Soon after you begin wearing your dentures, you may feel that your dentures are too large or even loose.  As our mouth and facial muscles become accustomed to the dentures, these feelings will go away.  You also may feel that you are salivating more than you normally do.  This feeling should go away as you get used to having the dentures in your mouth.  You may bite your cheek or your tongue; this will eventually resolve itself as you wear your dentures.  Some soreness is to be expected, but you should not hurt.  If your mouth hurts, call your dentist.  A denture adhesive may occasionally be necessary to hold your dentures in place more securely.  The dentist will let you know when one is recommended for you.  SPEAKING Wearing dentures will change the sound of your voice initially.  This will be noticed by you more than anyone else.  Bite and swallow before you speak, in order to place your dentures in position so that you may speak more clearly.  Practice speaking by reading aloud or counting from 1 to 100 very slowly and distinctly.  After some practice your mouth will become accustomed to your dentures and you will speak more clearly.  EATING Chewing will definitely be different after you receive your dentures.  With a little practice and patience you should be able to eat just about any kind of food.  Begin by eating small quantities of food  that are cut into small pieces.  Star with soft foods such as eggs, cooked vegetables, or puddings.  As you gain confidence advance  Your diet to whatever texture foods you can tolerate.  DENTURE CARE Dentures can collect plaque and calculus much the same as natural teeth can.  If not removed on a regular basis, your dentures will not look or feel clean, and you will experience denture odor.  It is very important that you remove your dentures at bedtime and clean them thoroughly.  You should: 1. Clean your dentures over a sink full of water so if dropped, breakage will be prevented. 2. Rinse your dentures with cool water to remove any large food particles. 3. Use soap and water or a denture cleanser or paste to clean the dentures.  Do not use regular toothpaste as it may abrade the denture base or teeth. 4. Use a moistened denture brush to clean all surfaces (inside and outside). 5. Rinse thoroughly to remove any remaining soap or denture cleanser. 6. Use a soft bristle toothbrush to gently brush any natural teeth, gums, tongue, and palate at bedtime and before reinserting your dentures. 7. Do not sleep with your dentures in your mouth at night.  Remove your dentures and soak them overnight in a denture cup filled with water or denture solution as recommended by your dentist.  This routine will become second nature and will increase the life and comfort   of your dentures.  Please do not try to adjust these dentures yourself; you could damage them.  FOLLOW-UP You should call or make an appointment with your dentist.  Your dentist would like to see you at least once a year for a check-up and examination. 

## 2014-03-31 ENCOUNTER — Encounter (HOSPITAL_COMMUNITY): Payer: Self-pay | Admitting: Dentistry

## 2014-04-01 ENCOUNTER — Ambulatory Visit (HOSPITAL_COMMUNITY): Payer: Medicaid - Dental | Admitting: Dentistry

## 2014-04-01 ENCOUNTER — Encounter (HOSPITAL_COMMUNITY): Payer: Self-pay | Admitting: Dentistry

## 2014-04-01 VITALS — BP 133/72 | HR 74 | Temp 97.9°F

## 2014-04-01 DIAGNOSIS — K08109 Complete loss of teeth, unspecified cause, unspecified class: Secondary | ICD-10-CM

## 2014-04-01 DIAGNOSIS — Z463 Encounter for fitting and adjustment of dental prosthetic device: Secondary | ICD-10-CM

## 2014-04-01 DIAGNOSIS — K000 Anodontia: Principal | ICD-10-CM

## 2014-04-01 DIAGNOSIS — K0820 Unspecified atrophy of edentulous alveolar ridge: Secondary | ICD-10-CM

## 2014-04-01 NOTE — Patient Instructions (Signed)
Keep dentures out if sore spots arise. Use salt water rinses as needed to aid healing. Return to clinic as scheduled. Call if problems arise before then. Lenn Cal, DDS

## 2014-04-01 NOTE — Progress Notes (Signed)
04/01/2014  Patient:            [name] Date of Birth:  Sep 24, 1970 MRN:                [id number]  BP 133/72  Pulse 74  Temp(Src) 97.9 F (36.6 C) (Oral)  [name] presents for evaluation of recently inserted upper and lower complete dentures. SUBJECTIVE: She denies having problems with the upper denture. Patient having some denture irritation to the left and right lingual aspects of the alveolar ridge. OBJECTIVE: There is no sign of denture irritation or erythema. Procedure: Pressure indicating paste was applied to the dentures. Adjustments were made as needed. [place]). Thick PIP applied to the denture borders. Adjustments made as needed. [place]). Occlusion evaluated and adjustments made as needed for Centric Relation and protrusive strokes. Patient with tendency to protrude to an end to end position. Patient was instructed on finding maximum intercuspation position again.  Patient accepts results. Patient to keep dentures out if sore spots develop. Use salt water rinses as needed to aid healing. Return to clinic as scheduled for denture adjustment.   Call if problems arise before then.   Lenn Cal, DDS

## 2014-04-15 ENCOUNTER — Encounter (HOSPITAL_COMMUNITY): Payer: Self-pay | Admitting: Dentistry

## 2014-04-16 ENCOUNTER — Ambulatory Visit (HOSPITAL_COMMUNITY): Payer: Medicaid - Dental | Admitting: Dentistry

## 2014-04-16 ENCOUNTER — Encounter (HOSPITAL_COMMUNITY): Payer: Self-pay | Admitting: Dentistry

## 2014-04-16 VITALS — BP 155/91 | HR 69 | Temp 97.8°F

## 2014-04-16 DIAGNOSIS — K08109 Complete loss of teeth, unspecified cause, unspecified class: Secondary | ICD-10-CM

## 2014-04-16 DIAGNOSIS — K000 Anodontia: Secondary | ICD-10-CM

## 2014-04-16 DIAGNOSIS — Z463 Encounter for fitting and adjustment of dental prosthetic device: Secondary | ICD-10-CM

## 2014-04-16 DIAGNOSIS — K062 Gingival and edentulous alveolar ridge lesions associated with trauma: Secondary | ICD-10-CM

## 2014-04-16 DIAGNOSIS — K0820 Unspecified atrophy of edentulous alveolar ridge: Secondary | ICD-10-CM

## 2014-04-16 NOTE — Progress Notes (Signed)
04/16/2014  Patient:            [name] Date of Birth:  1970/10/26 MRN:                [id number]  BP 155/91  Pulse 69  Temp(Src) 97.8 F (36.6 C) (Oral)  LUIZA CARRANCO presents for evaluation of recently inserted upper and lower complete dentures. SUBJECTIVE: She denies having problems with the upper denture. Patient having some denture irritation to the left lingual aspect of the alveolar ridge. OBJECTIVE: There is no sign of denture irritation or erythema. Procedure: Pressure indicating paste was applied to the dentures. Adjustments were made as needed. [place]). Thick PIP applied to the denture borders. Adjustments made as needed. [place]). Occlusion evaluated and no adjustments were made. Patient with tendency to protrude to an end to end position. Patient was again instructed on finding maximum intercuspation position.  Patient accepts results. Patient to keep dentures out if sore spots develop. Use salt water rinses as needed to aid healing. Return to clinic as scheduled for denture adjustment.   Call if problems arise before then.   Lenn Cal, DDS

## 2014-04-16 NOTE — Patient Instructions (Signed)
Patient to keep dentures out if sore spots develop. Use salt water rinses as needed to aid healing. Return to clinic as scheduled for denture adjustment.   Call if problems arise before then.  Eva Griffo F. Loney Domingo, DDS  

## 2014-04-30 ENCOUNTER — Encounter: Payer: Self-pay | Admitting: Neurology

## 2014-04-30 ENCOUNTER — Ambulatory Visit (INDEPENDENT_AMBULATORY_CARE_PROVIDER_SITE_OTHER): Payer: Medicaid Other | Admitting: Neurology

## 2014-04-30 VITALS — BP 195/108 | HR 64 | Wt 100.0 lb

## 2014-04-30 DIAGNOSIS — L308 Other specified dermatitis: Secondary | ICD-10-CM | POA: Insufficient documentation

## 2014-04-30 DIAGNOSIS — H479 Unspecified disorder of visual pathways: Secondary | ICD-10-CM

## 2014-04-30 DIAGNOSIS — G629 Polyneuropathy, unspecified: Secondary | ICD-10-CM

## 2014-04-30 DIAGNOSIS — H53009 Unspecified amblyopia, unspecified eye: Secondary | ICD-10-CM | POA: Insufficient documentation

## 2014-04-30 DIAGNOSIS — M792 Neuralgia and neuritis, unspecified: Secondary | ICD-10-CM

## 2014-04-30 DIAGNOSIS — G609 Hereditary and idiopathic neuropathy, unspecified: Secondary | ICD-10-CM

## 2014-04-30 MED ORDER — DULOXETINE HCL 60 MG PO CPEP: 60.0000 mg | ORAL_CAPSULE | Freq: Every day | ORAL | Status: DC

## 2014-04-30 NOTE — Progress Notes (Signed)
Reason for visit: [name] syndrome  [name] is an 43 y.o. 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 The patient has been able to remain off of alcohol, and she remains on gabapentin and Cymbalta in low dose for her neuropathy pain. She continues to have significant issues with hypersensitivity to light touch, and she has been able to wear blue jeans today for the first time since she was in the hospital, as clothing such as this oftentimes increases her pain. She is having some difficulty with sleeping at night. She does have some ongoing gait instability, but no recent falls. She denies issues controlling her bowels or her bladder. The patient occasionally will have some tremor in the hands. She returns to this office for an evaluation. She has been able to gain some weight, currently weighing 100 pounds. She is eating better.  Past Medical History  Diagnosis Date  . Hypertension   . Strachan's syndrome   . History of alcoholism   . Malnutrition     Past Surgical History  Procedure Laterality Date  . Tubal ligation    . Sural nerve bx Right 12/24/2013    Procedure: SURAL NERVE BIOPSY;  Surgeon: Kristeen Miss, MD;  Location: [facility];  Service: Neurosurgery;  Laterality: Right;  . Multiple extractions with alveoloplasty N/A 12/25/2013    Procedure: Extraction of tooth #'s 2,3,4,5,6,8,9,10,11,12,13,14,15,20,21,22,23,24,25,26,27, [phone] with alveoloplasty and bilateral mandibular tori reductions;  Surgeon: Lenn Cal, DDS;  Location: [facility];  Service: Oral Surgery;  Laterality: N/A;    Family History  Problem Relation Age of Onset  . Diabetes Father   . Diabetes Maternal Aunt   . Heart disease Maternal Aunt   . Diabetes Maternal Uncle   . Heart disease Maternal Uncle   . Drug abuse Maternal Grandmother   . Cancer Maternal Grandmother     intestinal  . Hypertension Maternal  Grandmother   . Cancer Maternal Grandfather     lung  . Hypertension Mother   . Raynaud syndrome Mother   . Hypertension Paternal Grandfather     Social history:  reports that she has been smoking.  She has never used smokeless tobacco. She reports that she does not drink alcohol or use illicit drugs.   No Known Allergies  Medications:  Current Outpatient Prescriptions on File Prior to Visit  Medication Sig Dispense Refill  . feeding supplement, RESOURCE BREEZE, (RESOURCE BREEZE) LIQD Take 1 Container by mouth 2 (two) times daily between meals.    0  . folic acid (FOLVITE) 1 MG tablet Take 1 tablet (1 mg total) by mouth daily.      Marland Kitchen gabapentin (NEURONTIN) 300 MG capsule Take 1 capsule (300 mg total) by mouth 3 (three) times daily.  90 capsule  2  . hydrALAZINE (APRESOLINE) 10 MG tablet Take 1 tablet (10 mg total) by mouth 3 (three) times daily as needed (for blood pressure greater than 140/90).  90 tablet  2  . Multiple Vitamins-Minerals (MULTIVITAMIN WITH MINERALS) tablet Take 1 tablet by mouth daily.      Marland Kitchen thiamine 100 MG tablet Take 1 tablet (100 mg total) by mouth daily.       No current facility-administered medications on file prior to visit.    ROS:  Out of a complete 14 system review of symptoms, the patient complains only of the following symptoms, and all other reviewed systems are negative.  Fatigue Cold intolerance, excessive thirst Achy muscles, walking difficulties Dizziness, numbness, weakness, tremors Anxiety  Blood pressure 195/108, pulse 64, weight 100 lb (45.36 kg).  Physical Exam  General: The patient is alert and cooperative at the time of the examination. The patient remains quite thin.  Skin: No significant peripheral edema is noted.   Neurologic Exam  Mental status: The patient is oriented x 3.  Cranial nerves: Facial symmetry is present. Speech is normal, no aphasia or dysarthria is noted. Extraocular movements are full. Visual fields are  full.  Motor: The patient has good strength in all 4 extremities.  Sensory examination: Soft touch sensation is symmetric on the face, arms, and legs, but the patient has hypersensitivity to soft touch below the knees.  Coordination: The patient has good finger-nose-finger and heel-to-shin bilaterally.  Gait and station: The patient has a normal gait. Tandem gait is unsteady. Romberg is negative. No drift is seen.  Reflexes: Deep tendon reflexes are symmetric, but are depressed.   EMG/NCV study 01/20/14:  IMPRESSION:  Nerve conduction studies were performed on all 4 extremities.  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 EMG evaluation of the right lower  extremity was unremarkable, without evidence of an overlying  lumbosacral radiculopathy. Prognosis for ongoing recovery appears to be good.    Assessment/Plan:  1. Strachan syndrome  2. Gait instability  The patient is having ongoing neuropathic pain. She will be increased on Cymbalta taking 30 mg the morning and 60 mg in the evening for 2 weeks, then go to 60 mg twice daily. The patient will call me if she is still having significant discomfort, and the gabapentin dosing will be increased as well. She will followup in about 3-4 months.  Jill Alexanders MD 04/30/2014 8:28 PM  [facility] [address] [place], [place] [address]  Phone [phone] Fax [phone]

## 2014-04-30 NOTE — Patient Instructions (Signed)

## 2014-05-12 ENCOUNTER — Telehealth: Payer: Self-pay | Admitting: Neurology

## 2014-05-12 MED ORDER — GABAPENTIN 600 MG PO TABS: 600.0000 mg | ORAL_TABLET | Freq: Three times a day (TID) | ORAL | Status: DC

## 2014-05-12 NOTE — Telephone Encounter (Signed)
Patient calling to state that her increased dosage of Cymbalta isn't working, please return call and advise.

## 2014-05-12 NOTE — Telephone Encounter (Signed)
I called patient. The patient is on maximum dose of Cymbalta, this is not effective. We will go up taking gabapentin 300 mg twice during the day, 600 mg at bedtime for one week, then take 600 mg in the morning and evening, 300 mg at midday for one week, then convert to the 600 mg tablets taking 1 tablet 3 times daily.

## 2014-05-20 ENCOUNTER — Encounter (HOSPITAL_COMMUNITY): Payer: Self-pay | Admitting: Dentistry

## 2014-05-21 ENCOUNTER — Telehealth: Payer: Self-pay | Admitting: Neurology

## 2014-05-21 NOTE — Telephone Encounter (Signed)
I called pt and she is wanting to confirm that the dose of cymbalta that she is to take is 60mg  po bid.   I told her from 04-30-14 last ofv note that Dr. [name], stated to increase and then maintain at 60 mg po bid.  I will call pharmacy [facility] and let them know, since she picked up bottle and the dosing stated cymbalta 60mg  once daily.   I spoke to Merrilee Seashore, pharmacist and let him know that cymbalta is 60mg  po bid.  He took this information.

## 2014-05-21 NOTE — Telephone Encounter (Signed)
Patient would like a call back as she has a question about the Cymbalta dosing. Best number to call is [phone].

## 2014-05-21 NOTE — Telephone Encounter (Signed)
Events noted. The patient should be on Cymbalta 60 mg twice daily.

## 2014-05-27 ENCOUNTER — Encounter (HOSPITAL_COMMUNITY): Payer: Self-pay | Admitting: Dentistry

## 2014-05-27 ENCOUNTER — Ambulatory Visit (HOSPITAL_COMMUNITY): Payer: Medicaid - Dental | Admitting: Dentistry

## 2014-05-27 VITALS — BP 171/97 | HR 66 | Temp 97.6°F

## 2014-05-27 DIAGNOSIS — K08109 Complete loss of teeth, unspecified cause, unspecified class: Secondary | ICD-10-CM

## 2014-05-27 DIAGNOSIS — Z972 Presence of dental prosthetic device (complete) (partial): Secondary | ICD-10-CM

## 2014-05-27 DIAGNOSIS — K0820 Unspecified atrophy of edentulous alveolar ridge: Secondary | ICD-10-CM

## 2014-05-27 DIAGNOSIS — Z463 Encounter for fitting and adjustment of dental prosthetic device: Secondary | ICD-10-CM

## 2014-05-27 DIAGNOSIS — K000 Anodontia: Secondary | ICD-10-CM

## 2014-05-27 DIAGNOSIS — K062 Gingival and edentulous alveolar ridge lesions associated with trauma: Secondary | ICD-10-CM

## 2014-05-27 NOTE — Patient Instructions (Signed)
Patient to keep dentures out if sore spots develop. Use salt water rinses as needed to aid healing. Return to clinic as scheduled for denture adjustment.   Call if problems arise before then.  Mykia Holton F. Jung Yurchak, DDS  

## 2014-05-27 NOTE — Progress Notes (Signed)
05/27/2014  Patient:            [name] Date of Birth:  [birth year] MRN:                [id number]  BP 171/97  Pulse 66  Temp(Src) 97.6 F (36.4 C) (Oral)  KAMBRIA GRIMA presents for evaluation of recently inserted upper and lower complete dentures. SUBJECTIVE: She denies having problems with the upper denture. Patient having some denture irritation to the mandibular lingual flange area #27-28. OBJECTIVE: There is no sign of denture irritation or erythema. Procedure: Pressure indicating paste was applied to the dentures. Adjustments were made as needed. [place] Thick PIP applied to the denture borders. Adjustments made as needed. [place]). Occlusion evaluated and no adjustments were made. Patient with tendency to protrude to an end to end position. Patient was again instructed on finding maximum intercuspation position.  Patient accepts results. Patient to keep dentures out if sore spots develop. Use salt water rinses as needed to aid healing. Return to clinic as scheduled for denture adjustment.   Call if problems arise before then.   Lenn Cal, DDS

## 2014-06-03 ENCOUNTER — Telehealth: Payer: Self-pay | Admitting: Neurology

## 2014-06-03 NOTE — Telephone Encounter (Signed)
Spoke to the patient's mother and she relayed that the patient's BP has been elevated, today 180/118.  The patient has been going to [facility], but they keep telling her they don't have any appointment.  She told her daughter to go to Urgent care right now, but is asking if Dr. [name] can increase her BP medication or recommend a PCP that patient can go to . Mother can be reached at [phone].

## 2014-06-03 NOTE — Telephone Encounter (Signed)
I called, left a message. The patient certainly will need a primary care physician for the blood pressure. They apparently went to an urgent medical care today. If they desire to have a referral to a primary care doctor, I'll try to get one set up. They are to call me back.

## 2014-06-05 ENCOUNTER — Ambulatory Visit: Payer: Medicaid Other | Attending: Internal Medicine | Admitting: Internal Medicine

## 2014-06-05 ENCOUNTER — Ambulatory Visit: Payer: Self-pay | Admitting: Internal Medicine

## 2014-06-05 ENCOUNTER — Encounter: Payer: Self-pay | Admitting: Internal Medicine

## 2014-06-05 VITALS — BP 145/81 | HR 64 | Temp 97.7°F | Resp 18 | Ht 65.0 in | Wt 107.0 lb

## 2014-06-05 DIAGNOSIS — G629 Polyneuropathy, unspecified: Secondary | ICD-10-CM

## 2014-06-05 DIAGNOSIS — E46 Unspecified protein-calorie malnutrition: Secondary | ICD-10-CM | POA: Diagnosis not present

## 2014-06-05 DIAGNOSIS — Z1239 Encounter for other screening for malignant neoplasm of breast: Secondary | ICD-10-CM

## 2014-06-05 DIAGNOSIS — Z79899 Other long term (current) drug therapy: Secondary | ICD-10-CM | POA: Diagnosis not present

## 2014-06-05 DIAGNOSIS — Z8249 Family history of ischemic heart disease and other diseases of the circulatory system: Secondary | ICD-10-CM | POA: Insufficient documentation

## 2014-06-05 DIAGNOSIS — F1021 Alcohol dependence, in remission: Secondary | ICD-10-CM | POA: Insufficient documentation

## 2014-06-05 DIAGNOSIS — F17200 Nicotine dependence, unspecified, uncomplicated: Secondary | ICD-10-CM | POA: Insufficient documentation

## 2014-06-05 DIAGNOSIS — I10 Essential (primary) hypertension: Secondary | ICD-10-CM | POA: Insufficient documentation

## 2014-06-05 DIAGNOSIS — G589 Mononeuropathy, unspecified: Secondary | ICD-10-CM | POA: Diagnosis present

## 2014-06-05 DIAGNOSIS — G609 Hereditary and idiopathic neuropathy, unspecified: Secondary | ICD-10-CM | POA: Diagnosis not present

## 2014-06-05 DIAGNOSIS — Z23 Encounter for immunization: Secondary | ICD-10-CM | POA: Diagnosis not present

## 2014-06-05 DIAGNOSIS — H479 Unspecified disorder of visual pathways: Secondary | ICD-10-CM | POA: Insufficient documentation

## 2014-06-05 LAB — LIPID PANEL
Cholesterol: 192 mg/dL (ref 0–200)
HDL: 67 mg/dL (ref >39)
LDL Cholesterol: 111 mg/dL — ABNORMAL HIGH (ref 0–99)
Total CHOL/HDL Ratio: 2.9 Ratio
Triglycerides: 69 mg/dL (ref <150)
VLDL: 14 mg/dL (ref 0–40)

## 2014-06-05 MED ORDER — AMLODIPINE BESYLATE 5 MG PO TABS: 5.0000 mg | ORAL_TABLET | Freq: Every day | ORAL | Status: DC

## 2014-06-05 MED ORDER — HYDRALAZINE HCL 10 MG PO TABS: 10.0000 mg | ORAL_TABLET | Freq: Three times a day (TID) | ORAL | Status: DC | PRN

## 2014-06-05 NOTE — Patient Instructions (Signed)
Smoking Cessation Quitting smoking is important to your health and has many advantages. However, it is not always easy to quit since nicotine is a very addictive drug. Oftentimes, people try 3 times or more before being able to quit. This document explains the best ways for you to prepare to quit smoking. Quitting takes hard work and a lot of effort, but you can do it. ADVANTAGES OF QUITTING SMOKING  You will live longer, feel better, and live better.  Your body will feel the impact of quitting smoking almost immediately.  Within 20 minutes, blood pressure decreases. Your pulse returns to its normal level.  After 8 hours, carbon monoxide levels in the blood return to normal. Your oxygen level increases.  After 24 hours, the chance of having a heart attack starts to decrease. Your breath, hair, and body stop smelling like smoke.  After 48 hours, damaged nerve endings begin to recover. Your sense of taste and smell improve.  After 72 hours, the body is virtually free of nicotine. Your bronchial tubes relax and breathing becomes easier.  After 2 to 12 weeks, lungs can hold more air. Exercise becomes easier and circulation improves.  The risk of having a heart attack, stroke, cancer, or lung disease is greatly reduced.  After 1 year, the risk of coronary heart disease is cut in half.  After 5 years, the risk of stroke falls to the same as a nonsmoker.  After 10 years, the risk of lung cancer is cut in half and the risk of other cancers decreases significantly.  After 15 years, the risk of coronary heart disease drops, usually to the level of a nonsmoker.  If you are pregnant, quitting smoking will improve your chances of having a healthy baby.  The people you live with, especially any children, will be healthier.  You will have extra money to spend on things other than cigarettes. QUESTIONS TO THINK ABOUT BEFORE ATTEMPTING TO QUIT You may want to talk about your answers with your  health care provider.  Why do you want to quit?  If you tried to quit in the past, what helped and what did not?  What will be the most difficult situations for you after you quit? How will you plan to handle them?  Who can help you through the tough times? Your family? Friends? A health care provider?  What pleasures do you get from smoking? What ways can you still get pleasure if you quit? Here are some questions to ask your health care provider:  How can you help me to be successful at quitting?  What medicine do you think would be best for me and how should I take it?  What should I do if I need more help?  What is smoking withdrawal like? How can I get information on withdrawal? GET READY  Set a quit date.  Change your environment by getting rid of all cigarettes, ashtrays, matches, and lighters in your home, car, or work. Do not let people smoke in your home.  Review your past attempts to quit. Think about what worked and what did not. GET SUPPORT AND ENCOURAGEMENT You have a better chance of being successful if you have help. You can get support in many ways.  Tell your family, friends, and coworkers that you are going to quit and need their support. Ask them not to smoke around you.  Get individual, group, or telephone counseling and support. Programs are available at local hospitals and health centers. Call   your local health department for information about programs in your area.  Spiritual beliefs and practices may help some smokers quit.  Download a "quit meter" on your computer to keep track of quit statistics, such as how long you have gone without smoking, cigarettes not smoked, and money saved.  Get a self-help book about quitting smoking and staying off tobacco. LEARN NEW SKILLS AND BEHAVIORS  Distract yourself from urges to smoke. Talk to someone, go for a walk, or occupy your time with a task.  Change your normal routine. Take a different route to work.  Drink tea instead of coffee. Eat breakfast in a different place.  Reduce your stress. Take a hot bath, exercise, or read a book.  Plan something enjoyable to do every day. Reward yourself for not smoking.  Explore interactive web-based programs that specialize in helping you quit. GET MEDICINE AND USE IT CORRECTLY Medicines can help you stop smoking and decrease the urge to smoke. Combining medicine with the above behavioral methods and support can greatly increase your chances of successfully quitting smoking.  Nicotine replacement therapy helps deliver nicotine to your body without the negative effects and risks of smoking. Nicotine replacement therapy includes nicotine gum, lozenges, inhalers, nasal sprays, and skin patches. Some may be available over-the-counter and others require a prescription.  Antidepressant medicine helps people abstain from smoking, but how this works is unknown. This medicine is available by prescription.  Nicotinic receptor partial agonist medicine simulates the effect of nicotine in your brain. This medicine is available by prescription. Ask your health care provider for advice about which medicines to use and how to use them based on your health history. Your health care provider will tell you what side effects to look out for if you choose to be on a medicine or therapy. Carefully read the information on the package. Do not use any other product containing nicotine while using a nicotine replacement product.  RELAPSE OR DIFFICULT SITUATIONS Most relapses occur within the first 3 months after quitting. Do not be discouraged if you start smoking again. Remember, most people try several times before finally quitting. You may have symptoms of withdrawal because your body is used to nicotine. You may crave cigarettes, be irritable, feel very hungry, cough often, get headaches, or have difficulty concentrating. The withdrawal symptoms are only temporary. They are strongest  when you first quit, but they will go away within 10-14 days. To reduce the chances of relapse, try to:  Avoid drinking alcohol. Drinking lowers your chances of successfully quitting.  Reduce the amount of caffeine you consume. Once you quit smoking, the amount of caffeine in your body increases and can give you symptoms, such as a rapid heartbeat, sweating, and anxiety.  Avoid smokers because they can make you want to smoke.  Do not let weight gain distract you. Many smokers will gain weight when they quit, usually less than 10 pounds. Eat a healthy diet and stay active. You can always lose the weight gained after you quit.  Find ways to improve your mood other than smoking. FOR MORE INFORMATION  www.smokefree.gov  Document Released: 08/23/2001 Document Revised: 01/13/2014 Document Reviewed: 12/08/2011 ExitCare Patient Information 2015 [employer]. This information is not intended to replace advice given to you by your health care provider. Make sure you discuss any questions you have with your health care provider. DASH Eating Plan DASH stands for "Dietary Approaches to Stop Hypertension." The DASH eating plan is a healthy eating plan that has   been shown to reduce high blood pressure (hypertension). Additional health benefits may include reducing the risk of type 2 diabetes mellitus, heart disease, and stroke. The DASH eating plan may also help with weight loss. WHAT DO I NEED TO KNOW ABOUT THE DASH EATING PLAN? For the DASH eating plan, you will follow these general guidelines:  Choose foods with a percent daily value for sodium of less than 5% (as listed on the food label).  Use salt-free seasonings or herbs instead of table salt or sea salt.  Check with your health care provider or pharmacist before using salt substitutes.  Eat lower-sodium products, often labeled as "lower sodium" or "no salt added."  Eat fresh foods.  Eat more vegetables, fruits, and low-fat dairy  products.  Choose whole grains. Look for the word "whole" as the first word in the ingredient list.  Choose fish and skinless chicken or turkey more often than red meat. Limit fish, poultry, and meat to 6 oz (170 g) each day.  Limit sweets, desserts, sugars, and sugary drinks.  Choose heart-healthy fats.  Limit cheese to 1 oz (28 g) per day.  Eat more home-cooked food and less restaurant, buffet, and fast food.  Limit fried foods.  Cook foods using methods other than frying.  Limit canned vegetables. If you do use them, rinse them well to decrease the sodium.  When eating at a restaurant, ask that your food be prepared with less salt, or no salt if possible. WHAT FOODS CAN I EAT? Seek help from a dietitian for individual calorie needs. Grains Whole grain or whole wheat bread. Brown rice. Whole grain or whole wheat pasta. Quinoa, bulgur, and whole grain cereals. Low-sodium cereals. Corn or whole wheat flour tortillas. Whole grain cornbread. Whole grain crackers. Low-sodium crackers. Vegetables Fresh or frozen vegetables (raw, steamed, roasted, or grilled). Low-sodium or reduced-sodium tomato and vegetable juices. Low-sodium or reduced-sodium tomato sauce and paste. Low-sodium or reduced-sodium canned vegetables.  Fruits All fresh, canned (in natural juice), or frozen fruits. Meat and Other Protein Products Ground beef (85% or leaner), grass-fed beef, or beef trimmed of fat. Skinless chicken or turkey. Ground chicken or turkey. Pork trimmed of fat. All fish and seafood. Eggs. Dried beans, peas, or lentils. Unsalted nuts and seeds. Unsalted canned beans. Dairy Low-fat dairy products, such as skim or 1% milk, 2% or reduced-fat cheeses, low-fat ricotta or cottage cheese, or plain low-fat yogurt. Low-sodium or reduced-sodium cheeses. Fats and Oils Tub margarines without trans fats. Light or reduced-fat mayonnaise and salad dressings (reduced sodium). Avocado. Safflower, olive, or canola  oils. Natural peanut or almond butter. Other Unsalted popcorn and pretzels. The items listed above may not be a complete list of recommended foods or beverages. Contact your dietitian for more options. WHAT FOODS ARE NOT RECOMMENDED? Grains White bread. White pasta. White rice. Refined cornbread. Bagels and croissants. Crackers that contain trans fat. Vegetables Creamed or fried vegetables. Vegetables in a cheese sauce. Regular canned vegetables. Regular canned tomato sauce and paste. Regular tomato and vegetable juices. Fruits Dried fruits. Canned fruit in light or heavy syrup. Fruit juice. Meat and Other Protein Products Fatty cuts of meat. Ribs, chicken wings, bacon, sausage, bologna, salami, chitterlings, fatback, hot dogs, bratwurst, and packaged luncheon meats. Salted nuts and seeds. Canned beans with salt. Dairy Whole or 2% milk, cream, half-and-half, and cream cheese. Whole-fat or sweetened yogurt. Full-fat cheeses or blue cheese. Nondairy creamers and whipped toppings. Processed cheese, cheese spreads, or cheese curds. Condiments Onion and garlic salt,   seasoned salt, table salt, and sea salt. Canned and packaged gravies. Worcestershire sauce. Tartar sauce. Barbecue sauce. Teriyaki sauce. Soy sauce, including reduced sodium. Steak sauce. Fish sauce. Oyster sauce. Cocktail sauce. Horseradish. Ketchup and mustard. Meat flavorings and tenderizers. Bouillon cubes. Hot sauce. Tabasco sauce. Marinades. Taco seasonings. Relishes. Fats and Oils Butter, stick margarine, lard, shortening, ghee, and bacon fat. Coconut, palm kernel, or palm oils. Regular salad dressings. Other Pickles and olives. Salted popcorn and pretzels. The items listed above may not be a complete list of foods and beverages to avoid. Contact your dietitian for more information. WHERE CAN I FIND MORE INFORMATION? National Heart, Lung, and Blood Institute: www.nhlbi.nih.gov/health/health-topics/topics/dash/ Document Released:  08/18/2011 Document Revised: 01/13/2014 Document Reviewed: 07/03/2013 ExitCare Patient Information 2015 [employer]. This information is not intended to replace advice given to you by your health care provider. Make sure you discuss any questions you have with your health care provider.  

## 2014-06-05 NOTE — Progress Notes (Signed)
Patient presents for f/u HTN And bilateral LE neuropathy

## 2014-06-05 NOTE — Progress Notes (Signed)
Patient ID: [name], female   DOB: July 07, 1971, 43 y.o.   MRN: [id number]  CC: HTN, neuropathy   HPI:  Patient reports that she has a history of HTN and neuropathy.  She is currently under the care of a neurologist who is managing her on cymbalta and gabapentin. She reports that she only has mild relief of pain.  She has been taking hydralazine three times per day.  Patient is currently smoking .5 ppd.  She reports that she has quit drinking alcohol.  No Known Allergies Past Medical History  Diagnosis Date  . Hypertension   . Strachan's syndrome   . History of alcoholism   . Malnutrition    Current Outpatient Prescriptions on File Prior to Visit  Medication Sig Dispense Refill  . DULoxetine (CYMBALTA) 60 MG capsule Take 60 mg by mouth 2 (two) times daily.      . feeding supplement, RESOURCE BREEZE, (RESOURCE BREEZE) LIQD Take 1 Container by mouth 2 (two) times daily between meals.    0  . folic acid (FOLVITE) 1 MG tablet Take 1 tablet (1 mg total) by mouth daily.      Marland Kitchen gabapentin (NEURONTIN) 600 MG tablet Take 1 tablet (600 mg total) by mouth 3 (three) times daily.  90 tablet  3  . hydrALAZINE (APRESOLINE) 10 MG tablet Take 1 tablet (10 mg total) by mouth 3 (three) times daily as needed (for blood pressure greater than 140/90).  90 tablet  2  . Multiple Vitamins-Minerals (MULTIVITAMIN WITH MINERALS) tablet Take 1 tablet by mouth daily.      Marland Kitchen thiamine 100 MG tablet Take 1 tablet (100 mg total) by mouth daily.       No current facility-administered medications on file prior to visit.   Family History  Problem Relation Age of Onset  . Diabetes Father   . Diabetes Maternal Aunt   . Heart disease Maternal Aunt   . Diabetes Maternal Uncle   . Heart disease Maternal Uncle   . Drug abuse Maternal Grandmother   . Cancer Maternal Grandmother     intestinal  . Hypertension Maternal Grandmother   . Cancer Maternal Grandfather     lung  . Hypertension Mother   . Raynaud syndrome  Mother   . Hypertension Paternal Grandfather    History   Social History  . Marital Status: Single    Spouse Name: N/A    Number of Children: 1  . Years of Education: hs   Occupational History  . unemployed    Social History Main Topics  . Smoking status: Current Every Day Smoker -- 0.25 packs/day  . Smokeless tobacco: Never Used  . Alcohol Use: No     Comment: Former quit March  . Drug Use: No  . Sexual Activity: No   Other Topics Concern  . Not on file   Social History Narrative  . No narrative on file    Review of Systems  Eyes: Positive for blurred vision.  Respiratory: Negative.   Cardiovascular: Negative for chest pain, palpitations and leg swelling.  Gastrointestinal: Negative.   Neurological: Positive for dizziness, tingling and headaches.     Objective:   Filed Vitals:   06/05/14 1217  BP: 145/81  Pulse: 64  Temp: 97.7 F (36.5 C)  Resp: 18    Physical Exam: Constitutional: Patient appears well-developed and well-nourished. No distress. Eyes: Conjunctivae and EOM are normal. PERRLA, no scleral icterus. Neck: Normal ROM. Neck supple. No JVD.  CVS: RRR, S1/S2 +,  no murmurs, no gallops, no carotid bruit.  Pulmonary: Effort and breath sounds normal, no stridor, rhonchi, wheezes, rales.  Abdominal: Soft. BS +,  no distension, tenderness, rebound or guarding.  Musculoskeletal: Normal range of motion. No edema and no tenderness.  Neuro: Alert. Normal reflexes, muscle tone coordination. No cranial nerve deficit. Skin: Skin is warm and dry. No rash noted. Not diaphoretic. No erythema. No pallor. Psychiatric: Normal mood and affect. Behavior, judgment, thought content normal.  Lab Results  Component Value Date   WBC 17.5* 12/25/2013   HGB 8.4* 12/25/2013   HCT 26.0* 12/25/2013   MCV 103.6* 12/25/2013   PLT 379 12/25/2013   Lab Results  Component Value Date   CREATININE 0.61 01/15/2014   BUN 6 01/15/2014   NA 135 01/15/2014   K 5.2 01/15/2014   CL 97  01/15/2014   CO2 26 01/15/2014    Lab Results  Component Value Date   HGBA1C 4.8 12/20/2013   Lipid Panel  No results found for this basename: chol, trig, hdl, cholhdl, vldl, ldlcalc       Assessment and plan:   [name] was seen today for follow-up and hypertension.  Diagnoses and associated orders for this visit:  Essential hypertension - Lipid panel - Added amLODipine (NORVASC) 5 MG tablet; Take 1 tablet (5 mg total) by mouth daily. - Refilled hydrALAZINE (APRESOLINE) 10 MG tablet; Take 1 tablet (10 mg total) by mouth 3 (three) times daily as needed (for blood pressure greater than 140/90).  Peripheral neuropathy Continue care with neurologist   Tobacco use disorder Smoking cessation discussed and explained how it affects current neuropathy  Breast cancer screening - MM Digital Screening; Future  Need for prophylactic vaccination against Streptococcus pneumoniae (pneumococcus) - Pneumococcal conjugate vaccine 13-valent  Need for prophylactic vaccination and inoculation against influenza - Flu Vaccine QUAD 36+ mos PF IM (Fluarix Quad PF)    Return in about 3 months (around 09/04/2014) for Hypertension/PAP and 2 week RN viist- BP check.       Chari Manning, NP-C [facility] [phone] 06/05/2014, 12:54 PM

## 2014-06-16 ENCOUNTER — Telehealth: Payer: Self-pay | Admitting: Emergency Medicine

## 2014-06-16 NOTE — Telephone Encounter (Signed)
Message copied by Ricci Barker on Mon Jun 16, 2014  4:56 PM ------      Message from: Chari Manning A      Created: Thu Jun 12, 2014 10:06 AM       Cholesterol is slightly elevated. Would advise patient to change diet and begin exercising. Please go over diet specifics as it relates to cholesterol ------

## 2014-06-16 NOTE — Telephone Encounter (Signed)
Pt given lab results with instructions to start low fat diet with exercise

## 2014-06-19 ENCOUNTER — Telehealth: Payer: Self-pay | Admitting: Neurology

## 2014-06-19 ENCOUNTER — Ambulatory Visit: Payer: Self-pay

## 2014-06-19 MED ORDER — GABAPENTIN 600 MG PO TABS: 600.0000 mg | ORAL_TABLET | Freq: Three times a day (TID) | ORAL | Status: DC

## 2014-06-19 MED ORDER — GABAPENTIN 300 MG PO CAPS: 600.0000 mg | ORAL_CAPSULE | Freq: Three times a day (TID) | ORAL | Status: DC

## 2014-06-19 MED ORDER — AMLODIPINE BESYLATE 5 MG PO TABS: 5.0000 mg | ORAL_TABLET | Freq: Every day | ORAL | Status: DC

## 2014-06-19 MED ORDER — HYDRALAZINE HCL 10 MG PO TABS: 10.0000 mg | ORAL_TABLET | Freq: Three times a day (TID) | ORAL | Status: DC | PRN

## 2014-06-19 NOTE — Progress Notes (Unsigned)
Patient ID: [name], female   DOB: August 28, 1971, 43 y.o.   MRN: [id number] Pt here for blood pressure recheck after being started on additional BP medication @ last visit 06/05/14 Pt is taking Hydralazine 10 mg tablet 3 times a day, Amlodipine 5 mg tab Pt states she was compliant with meds until Medicaid stopped her coverage 2 dys ago Pt mother states she will get medication from our pharmacy BP-157/97 65 C/o headache,stress

## 2014-06-19 NOTE — Telephone Encounter (Signed)
Patent's mother Tyna Jaksch @ [phone], stated Medicaid will no longer pay for Gabapentin and possibly Cymbalta as well.  Questioning if there's another medication that could take its place.  Patient has run out of Gabapentin.  Please call anytime and advise, may leave detailed message on voicemail.

## 2014-06-19 NOTE — Telephone Encounter (Signed)
I called back.  Ms [name] said the patient's medicaid lapsed, so they were having issues, but are working to get that reinstated at this time.  Current Medicaid formulary does not cover Gabapentin Tablets, however, they will cover Gabapentin Capsules.  Mom asked that we resend the Rx for Capsules so it will go through.  I explained we would have to send Rx for 300mg  caps and patient would need to take 2 caps to equal her 600mg  dose.  She verbalized understanding.  The total dose remains the same.  Regarding Cymbalta, mom said they were okay and did not need anything further. She thinks the issue was the lapse in coverage.  They will call us back if anything further is needed.

## 2014-06-19 NOTE — Patient Instructions (Addendum)
Water Intoxication Drinking fluids is important, but drinking too much can cause problems. Problems happen if water intake happens quickly over a short period of time and is faster than your body can remove it. Electrolytes (like salts in your body) and water continually move back and forth across cell membranes to keep them in balance. When this balance is upset, it can be very harmful or even fatal. Drinking too much water can lead to a condition known as water intoxication. It can also lead to a watering down of sodium in the body. This is called hyponatremia. Hyponatremia causes a shift of water from fluid outside your cells to the fluid in your cells. This causes cells to swell. If the cells in your brain swell too much, it can cause death. CAUSES   Water intoxication is more commonly seen in young infants.  Babies can get water intoxication from drinking several bottles of water a day or by drinking formula that has been diluted too much.  Their young age and small body mass makes it easy to take in too much water.  Athletes can also suffer from water intoxication.  They can do this when they replace fluids rapidly after working out without replacing salt (sodium and electrolytes). SYMPTOMS  Water intoxication is similar to drowning in fresh water. Untreated electrolyte imbalance and tissue swelling can cause:  Irregular heartbeat.  Collapse with unconsciousness.  Fluid in the lungs (pulmonary edema).  Fluttering eyelids.  Swelling of the brain and nerves. This can cause problems similar to alcohol intoxication and includes confusion, seizures, coma and finally, death.) DIAGNOSIS  Your caregiver may suspect what is wrong by the problems you are having. A test of the sodium levels in your blood can also help make the diagnosis. TREATMENT   Water intoxication is best prevented. You are unlikely to get water intoxication, even when drinking a lot of water, if the intake is over time.  Most normal adults need about three quarts of fluid each day. That also includes what you take in with food. You may need more water if:  The weather is very warm or very dry.  You are exercising.  You are taking certain medications.  Although it is possible to drink too much water, it is a very uncommon condition.  If water intoxication does happen, medications such as a water pill (diuretic) help correct the problem. Also, salts given through the vein are also helpful. PROGNOSIS  If treatment is given before tissue swelling is severe and causes damage to your brain, a complete recovery over a couple of days is usual.  Document Released: 10/05/2005 Document Revised: 11/21/2011 Document Reviewed: 12/07/2005 [facility] Patient Information 2015 [place], [place]. This information is not intended to replace advice given to you by your health care provider. Make sure you discuss any questions you have with your health care provider. DASH Eating Plan DASH stands for "Dietary Approaches to Stop Hypertension." The DASH eating plan is a healthy eating plan that has been shown to reduce high blood pressure (hypertension). Additional health benefits may include reducing the risk of type 2 diabetes mellitus, heart disease, and stroke. The DASH eating plan may also help with weight loss. WHAT DO I NEED TO KNOW ABOUT THE DASH EATING PLAN? For the DASH eating plan, you will follow these general guidelines:  Choose foods with a percent daily value for sodium of less than 5% (as listed on the food label).  Use salt-free seasonings or herbs instead of table salt  or sea salt.  Check with your health care provider or pharmacist before using salt substitutes.  Eat lower-sodium products, often labeled as "lower sodium" or "no salt added."  Eat fresh foods.  Eat more vegetables, fruits, and low-fat dairy products.  Choose whole grains. Look for the word "whole" as the first word in the ingredient  list.  Choose fish and skinless chicken or [place] more often than red meat. Limit fish, poultry, and meat to 6 oz (170 g) each day.  Limit sweets, desserts, sugars, and sugary drinks.  Choose heart-healthy fats.  Limit cheese to 1 oz (28 g) per day.  Eat more home-cooked food and less restaurant, buffet, and fast food.  Limit fried foods.  Cook foods using methods other than frying.  Limit canned vegetables. If you do use them, rinse them well to decrease the sodium.  When eating at a restaurant, ask that your food be prepared with less salt, or no salt if possible. WHAT FOODS CAN I EAT? Seek help from a dietitian for individual calorie needs. Grains Whole grain or whole wheat bread. Brown rice. Whole grain or whole wheat pasta. Quinoa, bulgur, and whole grain cereals. Low-sodium cereals. Corn or whole wheat flour tortillas. Whole grain cornbread. Whole grain crackers. Low-sodium crackers. Vegetables Fresh or frozen vegetables (raw, steamed, roasted, or grilled). Low-sodium or reduced-sodium tomato and vegetable juices. Low-sodium or reduced-sodium tomato sauce and paste. Low-sodium or reduced-sodium canned vegetables.  Fruits All fresh, canned (in natural juice), or frozen fruits. Meat and Other Protein Products Ground beef (85% or leaner), grass-fed beef, or beef trimmed of fat. Skinless chicken or [place]. Ground chicken or [place]. Pork trimmed of fat. All fish and seafood. Eggs. Dried beans, peas, or lentils. Unsalted nuts and seeds. Unsalted canned beans. Dairy Low-fat dairy products, such as skim or 1% milk, 2% or reduced-fat cheeses, low-fat ricotta or cottage cheese, or plain low-fat yogurt. Low-sodium or reduced-sodium cheeses. Fats and Oils Tub margarines without trans fats. Light or reduced-fat mayonnaise and salad dressings (reduced sodium). Avocado. Safflower, olive, or canola oils. Natural peanut or almond butter. Other Unsalted popcorn and pretzels. The items listed  above may not be a complete list of recommended foods or beverages. Contact your dietitian for more options. WHAT FOODS ARE NOT RECOMMENDED? Grains White bread. White pasta. White rice. Refined cornbread. Bagels and croissants. Crackers that contain trans fat. Vegetables Creamed or fried vegetables. Vegetables in a cheese sauce. Regular canned vegetables. Regular canned tomato sauce and paste. Regular tomato and vegetable juices. Fruits Dried fruits. Canned fruit in light or heavy syrup. Fruit juice. Meat and Other Protein Products Fatty cuts of meat. Ribs, chicken wings, bacon, sausage, bologna, salami, chitterlings, fatback, hot dogs, bratwurst, and packaged luncheon meats. Salted nuts and seeds. Canned beans with salt. Dairy Whole or 2% milk, cream, half-and-half, and cream cheese. Whole-fat or sweetened yogurt. Full-fat cheeses or blue cheese. Nondairy creamers and whipped toppings. Processed cheese, cheese spreads, or cheese curds. Condiments Onion and garlic salt, seasoned salt, table salt, and sea salt. Canned and packaged gravies. Worcestershire sauce. Tartar sauce. Barbecue sauce. Teriyaki sauce. Soy sauce, including reduced sodium. Steak sauce. Fish sauce. Oyster sauce. Cocktail sauce. Horseradish. Ketchup and mustard. Meat flavorings and tenderizers. Bouillon cubes. Hot sauce. Tabasco sauce. Marinades. Taco seasonings. Relishes. Fats and Oils Butter, stick margarine, lard, shortening, ghee, and bacon fat. Coconut, palm kernel, or palm oils. Regular salad dressings. Other Pickles and olives. Salted popcorn and pretzels. The items listed above may not be a  complete list of foods and beverages to avoid. Contact your dietitian for more information. WHERE CAN I FIND MORE INFORMATION? National Heart, Lung, and Blood Institute: travelstabloid.com Document Released: 08/18/2011 Document Revised: 01/13/2014 Document Reviewed: 07/03/2013 [facility] Patient  Information 2015 [place], [place]. This information is not intended to replace advice given to you by your health care provider. Make sure you discuss any questions you have with your health care provider.    Take prescribed medication daily and return to clinic next Thursday for recheck Medication sent to [facility]

## 2014-07-02 ENCOUNTER — Ambulatory Visit
Admission: RE | Admit: 2014-07-02 | Discharge: 2014-07-02 | Disposition: A | Discharge status: Home / Self Care | Payer: Medicaid Other | Source: Ambulatory Visit | Attending: Internal Medicine | Admitting: Internal Medicine

## 2014-07-02 ENCOUNTER — Other Ambulatory Visit: Payer: Self-pay | Admitting: Internal Medicine

## 2014-07-02 ENCOUNTER — Ambulatory Visit: Payer: Medicaid Other | Attending: Internal Medicine

## 2014-07-02 VITALS — BP 166/104 | HR 77 | Resp 16

## 2014-07-02 DIAGNOSIS — I10 Essential (primary) hypertension: Secondary | ICD-10-CM

## 2014-07-02 DIAGNOSIS — Z1231 Encounter for screening mammogram for malignant neoplasm of breast: Secondary | ICD-10-CM

## 2014-07-02 MED ORDER — AMLODIPINE BESYLATE 10 MG PO TABS: 10.0000 mg | ORAL_TABLET | Freq: Every day | ORAL | Status: DC

## 2014-07-02 NOTE — Patient Instructions (Signed)
Take 2 tablets Amlodopine (10) mg tablet until prescription runs out then start new dose 1 tablet daily Return to clinic next Friday for nurse visit

## 2014-07-02 NOTE — Progress Notes (Unsigned)
Patient ID: [name], female   DOB: 10-Jun-1971, 43 y.o.   MRN: [id number] Pt comes in for blood pressure after being started on BP medications Pt states she is compliant with taking Amlodipine 5 mg tab,Hydralazine 10 mg tab TID Denies swelling or chest pain BP-[id number] Orders to increase Norvasc to 10 mg this visit with repeat next week. If BP still elevated at next visit we will adjust Hydralazine per Valerie,NP Medication changed and e-scribed to pharmacy

## 2014-07-08 ENCOUNTER — Telehealth: Payer: Self-pay | Admitting: *Deleted

## 2014-07-08 NOTE — Telephone Encounter (Signed)
LVM for patient to call back to inform her of below

## 2014-07-08 NOTE — Telephone Encounter (Signed)
Message copied by Johny Shears on Tue Jul 08, 2014  4:13 PM ------      Message from: Lance Bosch      Created: Sun Jul 06, 2014  9:46 PM       Normal mammogram ------

## 2014-07-10 NOTE — Telephone Encounter (Signed)
LVM for patient to call back. ?

## 2014-07-15 ENCOUNTER — Telehealth: Payer: Self-pay | Admitting: Neurology

## 2014-07-15 MED ORDER — GABAPENTIN 600 MG PO TABS: 600.0000 mg | ORAL_TABLET | Freq: Four times a day (QID) | ORAL | Status: DC

## 2014-07-15 NOTE — Telephone Encounter (Signed)
Patient calling to request that her Cymbalta and Gabapentin dosage be increased, please return call and advise.

## 2014-07-15 NOTE — Telephone Encounter (Signed)
I called the patient. The patient is having increased neuropathy pain associated with the cooler weather. She is already maxed out on her Cymbalta dose, we will increase the gabapentin taking 600 mg 4 times daily.

## 2014-07-25 ENCOUNTER — Ambulatory Visit: Payer: Medicaid Other | Attending: Internal Medicine | Admitting: *Deleted

## 2014-07-25 VITALS — BP 123/77 | HR 70 | Temp 98.2°F | Wt 114.2 lb

## 2014-07-25 DIAGNOSIS — I10 Essential (primary) hypertension: Secondary | ICD-10-CM | POA: Insufficient documentation

## 2014-07-25 MED ORDER — AMLODIPINE BESYLATE 10 MG PO TABS: 10.0000 mg | ORAL_TABLET | Freq: Every day | ORAL | Status: DC

## 2014-07-25 NOTE — Progress Notes (Signed)
Patient presents with mom for BP check after increasing amlodipine to 10 mg Reports still smoking 1/2 ppd; not ready to quit  BP 123/77 P 70 T 98.2 oral SPO2 98% Wt 114.2lb  Patient instructed to continue amlodipine 10 mg daily and other meds as directed Instructed to call for med refill at least 7 days before running out so as not to go without Instructed to schedule f/u and pap appt with PCP for mid December 2015

## 2014-07-29 ENCOUNTER — Ambulatory Visit (INDEPENDENT_AMBULATORY_CARE_PROVIDER_SITE_OTHER): Payer: Medicaid Other | Admitting: Adult Health

## 2014-07-29 ENCOUNTER — Ambulatory Visit (HOSPITAL_COMMUNITY): Payer: Self-pay | Admitting: Dentistry

## 2014-07-29 ENCOUNTER — Encounter (HOSPITAL_COMMUNITY): Payer: Self-pay | Admitting: Dentistry

## 2014-07-29 ENCOUNTER — Encounter: Payer: Self-pay | Admitting: Adult Health

## 2014-07-29 VITALS — BP 140/104 | HR 73 | Temp 97.6°F

## 2014-07-29 VITALS — BP 161/96 | HR 79 | Ht 65.0 in | Wt 115.0 lb

## 2014-07-29 DIAGNOSIS — K000 Anodontia: Secondary | ICD-10-CM

## 2014-07-29 DIAGNOSIS — G629 Polyneuropathy, unspecified: Secondary | ICD-10-CM

## 2014-07-29 DIAGNOSIS — H53009 Unspecified amblyopia, unspecified eye: Secondary | ICD-10-CM

## 2014-07-29 DIAGNOSIS — H479 Unspecified disorder of visual pathways: Secondary | ICD-10-CM

## 2014-07-29 DIAGNOSIS — K08109 Complete loss of teeth, unspecified cause, unspecified class: Secondary | ICD-10-CM

## 2014-07-29 DIAGNOSIS — Z972 Presence of dental prosthetic device (complete) (partial): Secondary | ICD-10-CM

## 2014-07-29 DIAGNOSIS — M792 Neuralgia and neuritis, unspecified: Secondary | ICD-10-CM

## 2014-07-29 DIAGNOSIS — K0820 Unspecified atrophy of edentulous alveolar ridge: Secondary | ICD-10-CM

## 2014-07-29 DIAGNOSIS — Z463 Encounter for fitting and adjustment of dental prosthetic device: Secondary | ICD-10-CM

## 2014-07-29 DIAGNOSIS — L308 Other specified dermatitis: Secondary | ICD-10-CM

## 2014-07-29 MED ORDER — GABAPENTIN 600 MG PO TABS: ORAL_TABLET | ORAL | Status: DC

## 2014-07-29 NOTE — Progress Notes (Signed)
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 She returns today for follow-up. She is currently taking gabapentin 600 mg 4 times a day and Cymbalta 60 mg twice a day. She reports that her neuropathy pain has improved enough that she is able to wear jeans and shoes now. She states that she continues to have the burning and tingling in the entire leg bilaterally as well as the fingers. Gait has continues to improve. She does not walk with an assistive device. She did have one fall at [facility] a but did not sustain any injuires. Denies any ETOH use.   HISTORY 07/15/14 ([name]): 43 year old right-handed white female with a history of [name] syndrome. The patient has been able to remain off of alcohol, and she remains on gabapentin and Cymbalta in low dose for her neuropathy pain. She continues to have significant issues with hypersensitivity to light touch, and she has been able to wear blue jeans today for the first time since she was in the hospital, as clothing such as this oftentimes increases her pain. She is having some difficulty with sleeping at night. She does have some ongoing gait instability, but no recent falls. She denies issues controlling her bowels or her bladder. The patient occasionally will have some tremor in the hands. She returns to this office for an evaluation. She has been able to gain some weight, currently weighing 100 pounds. She is eating better.  REVIEW OF SYSTEMS: Out of a complete 14 system review of symptoms, the patient complains only of the following symptoms, and all other reviewed systems are negative  Anemia Blurring vision Runny nose Sleep talking Walking difficulty Dizziness, numbness, weakness Anemia Nervous/anxious    ALLERGIES: No Known Allergies  HOME MEDICATIONS: Outpatient Prescriptions Prior to  Visit  Medication Sig Dispense Refill  . amLODipine (NORVASC) 10 MG tablet Take 1 tablet (10 mg total) by mouth daily. 30 tablet 3  . DULoxetine (CYMBALTA) 60 MG capsule Take 60 mg by mouth 2 (two) times daily.    . folic acid (FOLVITE) 1 MG tablet Take 1 tablet (1 mg total) by mouth daily.    Marland Kitchen gabapentin (NEURONTIN) 600 MG tablet Take 1 tablet (600 mg total) by mouth 4 (four) times daily. 120 tablet 5  . hydrALAZINE (APRESOLINE) 10 MG tablet Take 1 tablet (10 mg total) by mouth 3 (three) times daily as needed (for blood pressure greater than 140/90). 90 tablet 2  . Multiple Vitamins-Minerals (MULTIVITAMIN WITH MINERALS) tablet Take 1 tablet by mouth daily.    . feeding supplement, RESOURCE BREEZE, (RESOURCE BREEZE) LIQD Take 1 Container by mouth 2 (two) times daily between meals.  0  . thiamine 100 MG tablet Take 1 tablet (100 mg total) by mouth daily.     No facility-administered medications prior to visit.    PAST MEDICAL HISTORY: Past Medical History  Diagnosis Date  . Hypertension   . [name]'s syndrome   . History of alcoholism   . Malnutrition     PAST SURGICAL HISTORY: Past Surgical History  Procedure Laterality Date  . Tubal ligation    . Sural nerve bx Right 12/24/2013    Procedure: SURAL NERVE BIOPSY;  Surgeon: Kristeen Miss, MD;  Location: [facility];  Service: Neurosurgery;  Laterality: Right;  . Multiple extractions with alveoloplasty N/A 12/25/2013    Procedure: Extraction of  tooth #'s 2,3,4,5,6,8,9,10,11,12,13,14,15,20,21,22,23,24,25,26,27, [phone] with alveoloplasty and bilateral mandibular tori reductions;  Surgeon: Lenn Cal, DDS;  Location: [facility];  Service: Oral Surgery;  Laterality: N/A;    FAMILY HISTORY: Family History  Problem Relation Age of Onset  . Diabetes Father   . Diabetes Maternal Aunt   . Heart disease Maternal Aunt   . Diabetes Maternal Uncle   . Heart disease Maternal Uncle   . Drug abuse Maternal Grandmother   . Cancer  Maternal Grandmother     intestinal  . Hypertension Maternal Grandmother   . Cancer Maternal Grandfather     lung  . Hypertension Mother   . Raynaud syndrome Mother   . Hypertension Paternal Grandfather     SOCIAL HISTORY: History   Social History  . Marital Status: Single    Spouse Name: N/A    Number of Children: 1  . Years of Education: hs   Occupational History  . unemployed    Social History Main Topics  . Smoking status: Current Every Day Smoker -- 0.25 packs/day  . Smokeless tobacco: Never Used     Comment: smoking 1/2 ppd  . Alcohol Use: No     Comment: Former quit March  . Drug Use: No  . Sexual Activity: No   Other Topics Concern  . Not on file   Social History Narrative   Patient lives at home with her Friend patient is widowed.   Patient is not working at this time.   Education high school.   Right handed.   Caffeine sometimes tea.      PHYSICAL EXAM  Filed Vitals:   07/29/14 1527  BP: 161/96  Pulse: 79  Height: 5\' 5"  (1.651 m)  Weight: 115 lb (52.164 kg)   Body mass index is 19.14 kg/(m^2).  Generalized: Well developed, in no acute distress   Neurological examination  Mentation: Alert oriented to time, place, history taking. Follows all commands speech and language fluent Cranial nerve II-XII: Pupils were equal round reactive to light. Extraocular movements were full, visual field were full on confrontational test. Facial sensation and strength were normal.  Uvula tongue midline. Head turning and shoulder shrug  were normal and symmetric. Motor: The motor testing reveals 5 over 5 strength of all 4 extremities. Good symmetric motor tone is noted throughout.  Sensory: Sensory testing is intact to soft touch In the upper extremities. Soft touch, pinprick and  vibration is decreased in the lower extremities.  No evidence of extinction is noted.  Coordination: Cerebellar testing reveals good finger-nose-finger and heel-to-shin bilaterally.  Gait  and station:  Patient has a steppage type gait and slightly wide-based. Tandem gait is slightly unsteady. Romberg is negative. Reflexes: Deep tendon reflexes are symmetric but depressed.   DIAGNOSTIC DATA (LABS, IMAGING, TESTING) - I reviewed patient records, labs, notes, testing and imaging myself where available.  Lab Results  Component Value Date   WBC 17.5* 12/25/2013   HGB 8.4* 12/25/2013   HCT 26.0* 12/25/2013   MCV 103.6* 12/25/2013   PLT 379 12/25/2013      Component Value Date/Time   NA 135 01/15/2014 1235   K 5.2 01/15/2014 1235   CL 97 01/15/2014 1235   CO2 26 01/15/2014 1235   GLUCOSE 82 01/15/2014 1235   BUN 6 01/15/2014 1235   CREATININE 0.61 01/15/2014 1235   CREATININE 0.57 12/27/2013 0628   CALCIUM 9.7 01/15/2014 1235   PROT 6.3 12/27/2013 0628   ALBUMIN 2.4* 12/27/2013 0628   AST  66* 12/27/2013 0628   ALT 11 12/27/2013 0628   ALKPHOS 168* 12/27/2013 0628   BILITOT 0.4 12/27/2013 0628   GFRNONAA >90 12/27/2013 0628   GFRAA >90 12/27/2013 0628   Lab Results  Component Value Date   CHOL 192 06/05/2014   HDL 67 06/05/2014   LDLCALC 111* 06/05/2014   TRIG 69 06/05/2014   CHOLHDL 2.9 06/05/2014   Lab Results  Component Value Date   HGBA1C 4.8 12/20/2013   Lab Results  Component Value Date   OFBPZWCH85 277 12/20/2013   Lab Results  Component Value Date   TSH 3.570 12/20/2013      ASSESSMENT AND PLAN 42 y.o. year old female  has a past medical history of Hypertension; [name]'s syndrome; History of alcoholism; and Malnutrition. here with;  1. [name]'s Syndrome 2. Peripheral neuropathy  Patient continues to have some discomfort with her neuropathy especially at nighttime. I will increase her dose to 900 mg at night she will continue taking 600 mg 3 times a day. If this is not beneficial we may have to consider switching her medication. She should continue taking Cymbalta 60 mg twice a day. If her symptoms worsen or she develops new symptoms  she should let us know. Otherwise she will follow up in 4 months or sooner if needed.     Ward Givens, MSN, NP-C 07/29/2014, 3:44 PM [facility] [address] [place], [place] [address] [phone]  Note: This document was prepared with digital dictation and possible smart phrase technology. Any transcriptional errors that result from this process are unintentional.

## 2014-07-29 NOTE — Patient Instructions (Signed)

## 2014-07-29 NOTE — Patient Instructions (Signed)
Patient to keep dentures out if sore spots develop. Use salt water rinses as needed to aid healing. Return to clinic as scheduled for denture adjustment.   Call if problems arise before then.  Ronald F. Kulinski, DDS  

## 2014-07-29 NOTE — Progress Notes (Signed)
I have read the note, and I agree with the clinical assessment and plan.  WILLIS,CHARLES KEITH   

## 2014-07-29 NOTE — Progress Notes (Signed)
07/29/2014  Patient:            [name] Date of Birth:  10-13-1970 MRN:                [id number]  BP 140/104 mmHg  Pulse 73  Temp(Src) 97.6 F (36.4 C) (Oral)  MERIEL KELLIHER presents for evaluation of recently inserted upper and lower complete dentures. Patient has gained 14 pounds since last visit.  SUBJECTIVE: She denies having problems with the upper denture. Patient having some denture irritation to the mandibular left buccal flange area. OBJECTIVE: There is no sign of denture irritation or erythema. Procedure: Pressure indicating paste was applied to the dentures. Adjustments were made as needed. [place] Thick PIP applied to the denture borders. Adjustments made as needed. [place] Occlusion evaluated and no adjustments were made. Patient with tendency to protrude to an end to end position but is improved over last visit. Patient was again instructed on finding maximum intercuspation position. Patient accepts results. Patient to keep dentures out if sore spots develop. Use salt water rinses as needed to aid healing. Return to clinic as scheduled for denture adjustment.   Call if problems arise before then.   Lenn Cal, DDS

## 2014-09-15 ENCOUNTER — Other Ambulatory Visit: Payer: Self-pay | Admitting: Internal Medicine

## 2014-09-16 ENCOUNTER — Other Ambulatory Visit: Payer: Self-pay | Admitting: *Deleted

## 2014-09-16 DIAGNOSIS — I10 Essential (primary) hypertension: Secondary | ICD-10-CM

## 2014-09-16 MED ORDER — HYDRALAZINE HCL 10 MG PO TABS
10.0000 mg | ORAL_TABLET | Freq: Three times a day (TID) | ORAL | Status: DC | PRN
Start: 2014-09-16 — End: 2014-12-09

## 2014-09-16 NOTE — Telephone Encounter (Signed)
Pt requested Refills Rx Hydralazine Rx was send to [facility]

## 2014-09-23 ENCOUNTER — Encounter (HOSPITAL_COMMUNITY): Payer: Self-pay | Admitting: Dentistry

## 2014-09-25 ENCOUNTER — Encounter (HOSPITAL_COMMUNITY): Payer: Self-pay | Admitting: Neurological Surgery

## 2014-12-02 ENCOUNTER — Encounter: Payer: Self-pay | Admitting: Adult Health

## 2014-12-02 ENCOUNTER — Ambulatory Visit (INDEPENDENT_AMBULATORY_CARE_PROVIDER_SITE_OTHER): Payer: Self-pay | Admitting: Adult Health

## 2014-12-02 VITALS — BP 156/91 | HR 69 | Ht 65.0 in | Wt 121.0 lb

## 2014-12-02 DIAGNOSIS — H53009 Unspecified amblyopia, unspecified eye: Secondary | ICD-10-CM

## 2014-12-02 DIAGNOSIS — L308 Other specified dermatitis: Principal | ICD-10-CM

## 2014-12-02 DIAGNOSIS — M792 Neuralgia and neuritis, unspecified: Secondary | ICD-10-CM

## 2014-12-02 DIAGNOSIS — H479 Unspecified disorder of visual pathways: Secondary | ICD-10-CM

## 2014-12-02 MED ORDER — GABAPENTIN 600 MG PO TABS: ORAL_TABLET | ORAL | Status: DC

## 2014-12-02 NOTE — Patient Instructions (Addendum)
Increase gabapentin to 1 tablet three times a day and 2 tablets at bedtime.  If your symptoms fail to improve please let me know.

## 2014-12-02 NOTE — Progress Notes (Signed)
I have read the note, and I agree with the clinical assessment and plan.  Nora Sabey KEITH   

## 2014-12-02 NOTE — Progress Notes (Signed)
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 She returns today for follow-up. She is currently taken gabapentin 600 mg 3 times a day and 900 mg at bedtime. She also takes Cymbalta 60 mg twice a day. She states that she has noticed improvement with her neuropathy pain however she continues to have the most discomfort at bedtime. She states the burning and tingling is worse below the knee but does extend up to the thighs. She also has numbness in the hands as well as tingling but not as bad as in the legs. She states that her gait has continued to improve. She has not had any falls. She states that sometimes wearing her shoes causes her discomfort. She states that she has hypersensitivity in the feet. She denies any alcohol use. She returns today for an evaluation.  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 She returns today for follow-up. She is currently taking gabapentin 600 mg 4 times a day and Cymbalta 60 mg twice a day. She reports that her neuropathy pain has improved enough that she is able to wear jeans and shoes now. She states that she continues to have the burning and tingling in the entire leg bilaterally as well as the fingers. Gait has continues to improve. She does not walk with an assistive device. She did have one fall at [facility] but did not sustain any injuires. Denies any ETOH use.   HISTORY 07/15/14 (WILLIS): 44 year old right-handed white female with a history of Strachan syndrome. The patient has been able to remain off of alcohol, and she remains on gabapentin and Cymbalta in low dose for her neuropathy pain. She continues to have significant issues with hypersensitivity to light touch, and she has been able to wear blue jeans today for the first time since she was in  the hospital, as clothing such as this oftentimes increases her pain. She is having some difficulty with sleeping at night. She does have some ongoing gait instability, but no recent falls. She denies issues controlling her bowels or her bladder. The patient occasionally will have some tremor in the hands. She returns to this office for an evaluation. She has been able to gain some weight, currently weighing 100 pounds. She is eating better.  REVIEW OF SYSTEMS: Out of a complete 14 system review of symptoms, the patient complains only of the following symptoms, and all other reviewed systems are negative.  See history of present illness  ALLERGIES: No Known Allergies  HOME MEDICATIONS: Outpatient Prescriptions Prior to Visit  Medication Sig Dispense Refill  . amLODipine (NORVASC) 10 MG tablet Take 1 tablet (10 mg total) by mouth daily. 30 tablet 3  . DULoxetine (CYMBALTA) 60 MG capsule Take 60 mg by mouth 2 (two) times daily.    . folic acid (FOLVITE) 1 MG tablet Take 1 tablet (1 mg total) by mouth daily.    Marland Kitchen gabapentin (NEURONTIN) 600 MG tablet Take 1 tablet by mouth TID and 1.5 tablets before bedtime 135 tablet 5  . hydrALAZINE (APRESOLINE) 10 MG tablet Take 1 tablet (10 mg total) by mouth 3 (three) times daily as needed (for blood pressure greater than 140/90). 90 tablet 2  . Multiple Vitamins-Minerals (MULTIVITAMIN WITH MINERALS) tablet Take 1 tablet by mouth daily.    Marland Kitchen  POTASSIUM CHLORIDE PO Take by mouth.     No facility-administered medications prior to visit.    PAST MEDICAL HISTORY: Past Medical History  Diagnosis Date  . Hypertension   . Strachan's syndrome   . History of alcoholism   . Malnutrition     PAST SURGICAL HISTORY: Past Surgical History  Procedure Laterality Date  . Tubal ligation    . Sural nerve bx Right 12/24/2013    Procedure: SURAL NERVE BIOPSY;  Surgeon: Kristeen Miss, MD;  Location: [facility];  Service: Neurosurgery;  Laterality: Right;  . Multiple  extractions with alveoloplasty N/A 12/25/2013    Procedure: Extraction of tooth #'s 2,3,4,5,6,8,9,10,11,12,13,14,15,20,21,22,23,24,25,26,27, [phone] with alveoloplasty and bilateral mandibular tori reductions;  Surgeon: Lenn Cal, DDS;  Location: [facility];  Service: Oral Surgery;  Laterality: N/A;    FAMILY HISTORY: Family History  Problem Relation Age of Onset  . Diabetes Father   . Diabetes Maternal Aunt   . Heart disease Maternal Aunt   . Diabetes Maternal Uncle   . Heart disease Maternal Uncle   . Drug abuse Maternal Grandmother   . Cancer Maternal Grandmother     intestinal  . Hypertension Maternal Grandmother   . Cancer Maternal Grandfather     lung  . Hypertension Mother   . Raynaud syndrome Mother   . Hypertension Paternal Grandfather     SOCIAL HISTORY: History   Social History  . Marital Status: Single    Spouse Name: N/A  . Number of Children: 1  . Years of Education: hs   Occupational History  . unemployed    Social History Main Topics  . Smoking status: Current Every Day Smoker -- 0.25 packs/day  . Smokeless tobacco: Never Used     Comment: smoking 1/2 ppd  . Alcohol Use: No     Comment: Former quit March  . Drug Use: No  . Sexual Activity: No   Other Topics Concern  . Not on file   Social History Narrative   Patient lives at home with her Friend patient is widowed.   Patient is not working at this time.   Education high school.   Right handed.   Caffeine sometimes tea.      PHYSICAL EXAM  Filed Vitals:   12/02/14 1426  BP: 156/91  Pulse: 69  Height: 5\' 5"  (1.651 m)  Weight: 121 lb (54.885 kg)   Body mass index is 20.14 kg/(m^2).  Generalized: Well developed, in no acute distress   Neurological examination  Mentation: Alert oriented to time, place, history taking. Follows all commands speech and language fluent Cranial nerve II-XII: Pupils were equal round reactive to light. Extraocular movements were full, visual field were  full on confrontational test. Facial sensation and strength were normal. Uvula tongue midline. Head turning and shoulder shrug  were normal and symmetric. Motor: The motor testing reveals 5 over 5 strength of all 4 extremities. Good symmetric motor tone is noted throughout.  Sensory: Sensory testing is intact to soft touch on all 4 extremities. Hypersensitivity noticed in the feet. No evidence of extinction is noted.  Coordination: Cerebellar testing reveals good finger-nose-finger and heel-to-shin bilaterally.  Gait and station: Gait is slightly wide-based. Tandem gait is normal. Romberg is negative. No drift is seen.  Reflexes: Deep tendon reflexes are symmetric and normal bilaterally.    DIAGNOSTIC DATA (LABS, IMAGING, TESTING) - I reviewed patient records, labs, notes, testing and imaging myself where available.    ASSESSMENT AND PLAN 44 y.o. year  old female  has a past medical history of Hypertension; Strachan's syndrome; History of alcoholism; and Malnutrition. here with:  1. Strachan Syndrome  Patient states that the discomfort in her feet has gotten slightly worse. I will increase her gabapentin to 2 tablets at bedtime. She will now be taken 600 mg 3 times a day and 1200 mg at bedtime. If she does not notice any improvement with the increase in medication we may consider switching her to Lyrica. She will continue taking Cymbalta 60 mg twice a day. Patient was advised that if her symptoms worsen or she develops new symptoms she she'll let us know. Otherwise she will follow-up in 6 months or sooner if needed.     Ward Givens, MSN, NP-C 12/02/2014, 2:48 PM [facility] [address] [place], [place] [address] [phone]  Note: This document was prepared with digital dictation and possible smart phrase technology. Any transcriptional errors that result from this process are unintentional.

## 2014-12-08 ENCOUNTER — Telehealth: Payer: Self-pay | Admitting: Internal Medicine

## 2014-12-08 ENCOUNTER — Other Ambulatory Visit: Payer: Self-pay | Admitting: Internal Medicine

## 2014-12-08 NOTE — Telephone Encounter (Signed)
Pt called requesting to speak to nurse, please f\u with pt

## 2014-12-09 ENCOUNTER — Telehealth: Payer: Self-pay | Admitting: Internal Medicine

## 2014-12-09 ENCOUNTER — Telehealth: Payer: Self-pay | Admitting: Adult Health

## 2014-12-09 ENCOUNTER — Other Ambulatory Visit: Payer: Self-pay | Admitting: *Deleted

## 2014-12-09 DIAGNOSIS — I10 Essential (primary) hypertension: Secondary | ICD-10-CM

## 2014-12-09 MED ORDER — HYDRALAZINE HCL 10 MG PO TABS
10.0000 mg | ORAL_TABLET | Freq: Three times a day (TID) | ORAL | Status: DC | PRN
Start: 2014-12-09 — End: 2014-12-17

## 2014-12-09 MED ORDER — AMLODIPINE BESYLATE 10 MG PO TABS
10.0000 mg | ORAL_TABLET | Freq: Every day | ORAL | Status: DC
Start: 2014-12-09 — End: 2014-12-17

## 2014-12-09 NOTE — Progress Notes (Signed)
Pt needed refills I sent them to our pharmacy.

## 2014-12-09 NOTE — Telephone Encounter (Signed)
Patient is in waiting room. She is requesting a refill on the following.   amLODipine (NORVASC) 10 MG tablet hydrALAZINE (APRESOLINE) 10 MG tablet

## 2014-12-09 NOTE — Telephone Encounter (Signed)
Pt is calling stating that the pharmacy did not change the dosage on gabapentin (NEURONTIN) 600 MG tablet so this means she is going to run out sooner than she is suppose to. Pt wants to know if you could call in the difference. Please call and advise.

## 2014-12-09 NOTE — Telephone Encounter (Signed)
A new Rx was sent to the pharmacy on 03/22.  I called the pharmacy.  Spoke with [place].  She said they do have the new Rx on file, but refilled the old Rx instead.  They will process new Rx today.  I called the patient back to advise.  She is aware.

## 2014-12-17 ENCOUNTER — Ambulatory Visit: Payer: Medicaid Other | Attending: Internal Medicine | Admitting: Internal Medicine

## 2014-12-17 ENCOUNTER — Encounter: Payer: Self-pay | Admitting: Internal Medicine

## 2014-12-17 VITALS — BP 135/80 | HR 78 | Temp 97.7°F | Resp 16 | Ht 65.0 in | Wt 123.0 lb

## 2014-12-17 DIAGNOSIS — I10 Essential (primary) hypertension: Secondary | ICD-10-CM | POA: Diagnosis not present

## 2014-12-17 DIAGNOSIS — Z124 Encounter for screening for malignant neoplasm of cervix: Secondary | ICD-10-CM

## 2014-12-17 LAB — COMPLETE METABOLIC PANEL WITH GFR
ALT: 13 U/L (ref 0–35)
AST: 24 U/L (ref 0–37)
Albumin: 4.3 g/dL (ref 3.5–5.2)
Alkaline Phosphatase: 121 U/L — ABNORMAL HIGH (ref 39–117)
BUN: 8 mg/dL (ref 6–23)
CO2: 24 mEq/L (ref 19–32)
Calcium: 8.8 mg/dL (ref 8.4–10.5)
Chloride: 100 mEq/L (ref 96–112)
Creat: 0.54 mg/dL (ref 0.50–1.10)
GFR, Est African American: >89 mL/min
GFR, Est Non African American: >89 mL/min
Glucose, Bld: 73 mg/dL (ref 70–99)
Potassium: 4.2 mEq/L (ref 3.5–5.3)
Sodium: 135 mEq/L (ref 135–145)
Total Bilirubin: 0.3 mg/dL (ref 0.2–1.2)
Total Protein: 7.8 g/dL (ref 6.0–8.3)

## 2014-12-17 LAB — LIPID PANEL
Cholesterol: 207 mg/dL — ABNORMAL HIGH (ref 0–200)
HDL: 60 mg/dL (ref >=46)
LDL Cholesterol: 122 mg/dL — ABNORMAL HIGH (ref 0–99)
Total CHOL/HDL Ratio: 3.5 Ratio
Triglycerides: 125 mg/dL (ref <150)
VLDL: 25 mg/dL (ref 0–40)

## 2014-12-17 LAB — CBC
HCT: 40 % (ref 36.0–46.0)
Hemoglobin: 13.7 g/dL (ref 12.0–15.0)
MCH: 35.5 pg — ABNORMAL HIGH (ref 26.0–34.0)
MCHC: 34.3 g/dL (ref 30.0–36.0)
MCV: 103.6 fL — ABNORMAL HIGH (ref 78.0–100.0)
MPV: 10.2 fL (ref 8.6–12.4)
Platelets: 326 10*3/uL (ref 150–400)
RBC: 3.86 MIL/uL — ABNORMAL LOW (ref 3.87–5.11)
RDW: 13.5 % (ref 11.5–15.5)
WBC: 12 10*3/uL — ABNORMAL HIGH (ref 4.0–10.5)

## 2014-12-17 MED ORDER — HYDRALAZINE HCL 10 MG PO TABS: 10.0000 mg | ORAL_TABLET | Freq: Three times a day (TID) | ORAL | Status: DC | PRN

## 2014-12-17 MED ORDER — AMLODIPINE BESYLATE 10 MG PO TABS: 10.0000 mg | ORAL_TABLET | Freq: Every day | ORAL | Status: DC

## 2014-12-17 NOTE — Progress Notes (Signed)
Patient here for annual exam with pap.  No complaints and had mammogram before [facility] that was normal

## 2014-12-17 NOTE — Patient Instructions (Signed)

## 2014-12-17 NOTE — Progress Notes (Signed)
Patient ID: [name], female   DOB: 02-Oct-1970, 44 y.o.   MRN: [id number]  CC: pap smear  HPI: [name] is a 44 y.o. female here today for a follow up visit.  Patient has past medical history of HTN and strachan's syndrome.  Patient is requesting a physical.  She denies vaginal discharge, itch, odor, lesion. She had a recent mammogram 6 months ago that was normal. She continues to smoke cigarettes but denies alcohol and drug use. She has no complaints today.   Patient has No headache, No chest pain, No abdominal pain - No Nausea, No new weakness tingling or numbness, No Cough - SOB.  No Known Allergies Past Medical History  Diagnosis Date  . Hypertension   . Strachan's syndrome   . History of alcoholism   . Malnutrition    Current Outpatient Prescriptions on File Prior to Visit  Medication Sig Dispense Refill  . amLODipine (NORVASC) 10 MG tablet Take 1 tablet (10 mg total) by mouth daily. 30 tablet 3  . DULoxetine (CYMBALTA) 60 MG capsule Take 60 mg by mouth 2 (two) times daily.    . folic acid (FOLVITE) 1 MG tablet Take 1 tablet (1 mg total) by mouth daily.    Marland Kitchen gabapentin (NEURONTIN) 600 MG tablet Take 1 tablet by mouth TID and 2 tablets before bedtime 150 tablet 5  . hydrALAZINE (APRESOLINE) 10 MG tablet Take 1 tablet (10 mg total) by mouth 3 (three) times daily as needed (for blood pressure greater than 140/90). 90 tablet 2  . Multiple Vitamins-Minerals (MULTIVITAMIN WITH MINERALS) tablet Take 1 tablet by mouth daily.    Marland Kitchen POTASSIUM CHLORIDE PO Take by mouth.     No current facility-administered medications on file prior to visit.   Family History  Problem Relation Age of Onset  . Diabetes Father   . Diabetes Maternal Aunt   . Heart disease Maternal Aunt   . Diabetes Maternal Uncle   . Heart disease Maternal Uncle   . Drug abuse Maternal Grandmother   . Cancer Maternal Grandmother     intestinal  . Hypertension Maternal Grandmother   . Cancer Maternal Grandfather      lung  . Hypertension Mother   . Raynaud syndrome Mother   . Hypertension Paternal Grandfather    History   Social History  . Marital Status: Single    Spouse Name: N/A  . Number of Children: 1  . Years of Education: hs   Occupational History  . unemployed    Social History Main Topics  . Smoking status: Current Every Day Smoker -- 0.25 packs/day  . Smokeless tobacco: Never Used     Comment: smoking 1/2 ppd  . Alcohol Use: No     Comment: Former quit March  . Drug Use: No  . Sexual Activity: No   Other Topics Concern  . Not on file   Social History Narrative   Patient lives at home with her Friend patient is widowed.   Patient is not working at this time.   Education high school.   Right handed.   Caffeine sometimes tea.    Review of Systems: Constitutional: Negative for fever, chills, diaphoresis, activity change, appetite change and fatigue. HENT: Negative for ear pain, nosebleeds, congestion, facial swelling, rhinorrhea, neck pain, neck stiffness and ear discharge.  Eyes: Negative for pain, discharge, redness, itching and visual disturbance. Respiratory: Negative for cough, choking, chest tightness, shortness of breath, wheezing and stridor.  Cardiovascular: Negative for chest pain,  palpitations and leg swelling. Gastrointestinal: Negative for abdominal distention. Genitourinary: Negative for dysuria, urgency, frequency, hematuria, flank pain, decreased urine volume, difficulty urinating and dyspareunia.  Musculoskeletal: Negative for back pain, joint swelling, arthralgias and gait problem. Neurological: Negative for dizziness, tremors, seizures, syncope, facial asymmetry, speech difficulty, weakness, light-headedness, numbness and headaches.  Hematological: Negative for adenopathy. Does not bruise/bleed easily. Psychiatric/Behavioral: Negative for hallucinations, behavioral problems, confusion, dysphoric mood, decreased concentration and agitation.    Objective:    Filed Vitals:   12/17/14 1415  BP: 135/80  Pulse: 78  Temp: 97.7 F (36.5 C)  Resp: 16    Physical Exam: Constitutional: Patient appears well-developed and well-nourished. No distress. HENT: Normocephalic, atraumatic, External right and left ear normal. Oropharynx is clear and moist.  Eyes: Conjunctivae and EOM are normal. PERRLA, no scleral icterus. Neck: Normal ROM. Neck supple. No JVD. No tracheal deviation. No thyromegaly. CVS: RRR, S1/S2 +, no murmurs, no gallops, no carotid bruit.  Pulmonary: Effort and breath sounds normal, no stridor, rhonchi, wheezes, rales.  Abdominal: Soft. BS +,  no distension, tenderness, rebound or guarding.  Musculoskeletal: Normal range of motion. No edema and no tenderness.  Lymphadenopathy: No lymphadenopathy noted, cervical, inguinal or axillary Neuro: Alert. Normal reflexes, muscle tone coordination. No cranial nerve deficit. Skin: Skin is warm and dry. No rash noted. Not diaphoretic. No erythema. No pallor. Psychiatric: Normal mood and affect. Behavior, judgment, thought content normal. Genitalia: Normal female without lesion, discharge or tenderness, NSSA, NT, no adnexal masses felt on exam  Breast: No tenderness, masses, or nipple abnormality     Lab Results  Component Value Date   WBC 17.5* 12/25/2013   HGB 8.4* 12/25/2013   HCT 26.0* 12/25/2013   MCV 103.6* 12/25/2013   PLT 379 12/25/2013   Lab Results  Component Value Date   CREATININE 0.61 01/15/2014   BUN 6 01/15/2014   NA 135 01/15/2014   K 5.2 01/15/2014   CL 97 01/15/2014   CO2 26 01/15/2014    Lab Results  Component Value Date   HGBA1C 4.8 12/20/2013   Lipid Panel     Component Value Date/Time   CHOL 192 06/05/2014 1310   TRIG 69 06/05/2014 1310   HDL 67 06/05/2014 1310   CHOLHDL 2.9 06/05/2014 1310   VLDL 14 06/05/2014 1310   LDLCALC 111* 06/05/2014 1310       Assessment and plan:   [name] was seen today for annual exam.  Diagnoses and all orders for  this visit:  Essential hypertension Orders: -     hydrALAZINE (APRESOLINE) 10 MG tablet; Take 1 tablet (10 mg total) by mouth 3 (three) times daily as needed (for blood pressure greater than 140/90). -     amLODipine (NORVASC) 10 MG tablet; Take 1 tablet (10 mg total) by mouth daily. -     Lipid panel -     COMPLETE METABOLIC PANEL WITH GFR -     CBC -     Vitamin D, 25-hydroxy Patient blood pressure is stable and may continue on current medication.  Education on diet, exercise, and modifiable risk factors discussed. Will obtain appropriate labs as needed. Will follow up in 3-6 months.   Papanicolaou smear Orders: -     Cervicovaginal ancillary only -     Cytology - PAP -     HIV antibody (with reflex) -     RPR Return in about 6 months (around 06/18/2015) for Hypertension.       Chari Manning, NP-C  [facility] [phone] 12/17/2014, 2:33 PM'

## 2014-12-18 LAB — RPR

## 2014-12-18 LAB — VITAMIN D 25 HYDROXY (VIT D DEFICIENCY, FRACTURES): Vit D, 25-Hydroxy: 29 ng/mL — ABNORMAL LOW (ref 30–100)

## 2014-12-18 LAB — CERVICOVAGINAL ANCILLARY ONLY
Chlamydia: NEGATIVE
Neisseria Gonorrhea: NEGATIVE
Wet Prep (BD Affirm): NEGATIVE

## 2014-12-18 LAB — CYTOLOGY - PAP

## 2014-12-18 LAB — HIV ANTIBODY (ROUTINE TESTING W REFLEX): HIV 1&2 Ab, 4th Generation: NONREACTIVE

## 2014-12-22 ENCOUNTER — Telehealth: Payer: Self-pay | Admitting: *Deleted

## 2014-12-22 MED ORDER — METRONIDAZOLE 500 MG PO TABS: 2000.0000 mg | ORAL_TABLET | Freq: Once | ORAL | Status: DC

## 2014-12-22 NOTE — Telephone Encounter (Signed)
Pt is aware of her lab results.  

## 2014-12-22 NOTE — Telephone Encounter (Signed)
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 Please explain to patient that she needs to inform all partners of specific disease as this is not a test that is commonly performed in men. Please send prescription of Flagyl 2 g to take once. No alcohol with this drug. Please complete state reporting sheet.

## 2015-01-06 ENCOUNTER — Telehealth: Payer: Self-pay | Admitting: Neurology

## 2015-01-06 MED ORDER — DULOXETINE HCL 60 MG PO CPEP: 60.0000 mg | ORAL_CAPSULE | Freq: Two times a day (BID) | ORAL | Status: DC

## 2015-01-06 NOTE — Telephone Encounter (Signed)
Rx has been sent.  I called back to advise.  Patient is aware.

## 2015-01-06 NOTE — Telephone Encounter (Signed)
Patient called requesting a refill for DULoxetine (CYMBALTA) 60 MG capsule. Pharmacy: [facility] [phone]. Pt's c/b [phone]

## 2015-01-27 DIAGNOSIS — Z0289 Encounter for other administrative examinations: Secondary | ICD-10-CM

## 2015-06-04 ENCOUNTER — Telehealth: Payer: Self-pay

## 2015-06-04 ENCOUNTER — Ambulatory Visit: Payer: Self-pay | Admitting: Adult Health

## 2015-06-04 NOTE — Telephone Encounter (Signed)
Patient no showed apt today with Megan.

## 2015-06-05 ENCOUNTER — Encounter: Payer: Self-pay | Admitting: Adult Health

## 2015-07-08 ENCOUNTER — Other Ambulatory Visit: Payer: Self-pay | Admitting: Adult Health

## 2015-07-17 ENCOUNTER — Other Ambulatory Visit: Payer: Self-pay | Admitting: Adult Health

## 2015-08-17 ENCOUNTER — Other Ambulatory Visit: Payer: Self-pay | Admitting: Neurology

## 2015-08-18 ENCOUNTER — Other Ambulatory Visit: Payer: Self-pay

## 2015-08-20 ENCOUNTER — Other Ambulatory Visit: Payer: Self-pay

## 2015-08-20 ENCOUNTER — Telehealth: Payer: Self-pay | Admitting: Neurology

## 2015-08-20 MED ORDER — DULOXETINE HCL 60 MG PO CPEP: 60.0000 mg | ORAL_CAPSULE | Freq: Two times a day (BID) | ORAL | Status: DC

## 2015-08-20 NOTE — Telephone Encounter (Signed)
Patient is calling to get a refill called in for medication DULoxetine (CYMBALTA) 60 MG capsule to [facility] telephone# [phone]. Thank you.

## 2015-08-20 NOTE — Telephone Encounter (Signed)
Rx has been sent  

## 2015-08-26 ENCOUNTER — Telehealth: Payer: Self-pay | Admitting: Neurology

## 2015-08-26 MED ORDER — GABAPENTIN 600 MG PO TABS: ORAL_TABLET | ORAL | Status: DC

## 2015-08-26 NOTE — Telephone Encounter (Signed)
Rx has been sent.  Receipt confirmed by pharmacy.   

## 2015-08-26 NOTE — Telephone Encounter (Signed)
Pt needs refill on gabapentin (NEURONTIN) 600 MG tablet. She has scheduled appt for next week.

## 2015-09-03 ENCOUNTER — Ambulatory Visit (INDEPENDENT_AMBULATORY_CARE_PROVIDER_SITE_OTHER): Payer: Medicaid Other | Admitting: Adult Health

## 2015-09-03 ENCOUNTER — Encounter: Payer: Self-pay | Admitting: Adult Health

## 2015-09-03 VITALS — BP 148/88 | HR 75 | Ht 65.0 in | Wt 123.0 lb

## 2015-09-03 DIAGNOSIS — H53009 Unspecified amblyopia, unspecified eye: Secondary | ICD-10-CM

## 2015-09-03 DIAGNOSIS — M792 Neuralgia and neuritis, unspecified: Secondary | ICD-10-CM

## 2015-09-03 DIAGNOSIS — L308 Other specified dermatitis: Principal | ICD-10-CM

## 2015-09-03 DIAGNOSIS — H479 Unspecified disorder of visual pathways: Secondary | ICD-10-CM

## 2015-09-03 MED ORDER — GABAPENTIN 600 MG PO TABS: ORAL_TABLET | ORAL | Status: DC

## 2015-09-03 MED ORDER — DULOXETINE HCL 60 MG PO CPEP: 60.0000 mg | ORAL_CAPSULE | Freq: Two times a day (BID) | ORAL | Status: DC

## 2015-09-03 NOTE — Progress Notes (Signed)
I have read the note, and I agree with the clinical assessment and plan.  Jalonda Antigua KEITH   

## 2015-09-03 NOTE — Patient Instructions (Signed)
Continue Gabapentin and Cymbalta If your symptoms worsen or you develop new symptoms please let us know.

## 2015-09-03 NOTE — Progress Notes (Signed)
PATIENT: [name] DOB: 05-17-1971  REASON FOR VISIT: follow up- Strachan's syndrome HISTORY FROM: patient  HISTORY OF PRESENT ILLNESS: [name] is a 44 year old female with a history of strachan's syndrome. She returns today for follow-up. She continues to take gabapentin 600 mg 3 times a day and 900 mg at bedtime. She also takes Cymbalta 60 mg twice a day. She states that this is controlling her discomfort well. She states occasionally she will try to cut back her gabapentin dose if she can tolerate the discomfort. She denies any significant changes with her gait or balance. She states she did have a fall while crossing the street but fortunately did not suffer any significant injuries. The patient has refrained from drinking alcohol. Overall she feels that she is doing well. She returns today for medication refills.  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 She returns today for follow-up. She is currently taken gabapentin 600 mg 3 times a day and 900 mg at bedtime. She also takes Cymbalta 60 mg twice a day. She states that she has noticed improvement with her neuropathy pain however she continues to have the most discomfort at bedtime. She states the burning and tingling is worse below the knee but does extend up to the thighs. She also has numbness in the hands as well as tingling but not as bad as in the legs. She states that her gait has continued to improve. She has not had any falls. She states that sometimes wearing her shoes causes her discomfort. She states that she has hypersensitivity in the feet. She denies any alcohol use. She returns today for an evaluation.   REVIEW OF SYSTEMS: Out of a complete 14 system review of symptoms, the patient complains only of the following symptoms, and all other reviewed systems are negative.  Excessive thirst, cough, shortness breath, runny nose, daytime sleepiness, muscle cramps, walking  difficulty, nervous/anxious, weakness, numbness, dizziness  ALLERGIES: No Known Allergies  HOME MEDICATIONS: Outpatient Prescriptions Prior to Visit  Medication Sig Dispense Refill  . amLODipine (NORVASC) 10 MG tablet Take 1 tablet (10 mg total) by mouth daily. 30 tablet 5  . DULoxetine (CYMBALTA) 60 MG capsule Take 1 capsule (60 mg total) by mouth 2 (two) times daily. 60 capsule 0  . folic acid (FOLVITE) 1 MG tablet Take 1 tablet (1 mg total) by mouth daily.    Marland Kitchen gabapentin (NEURONTIN) 600 MG tablet TAKE 1 TABLET BY MOUTH 3 TIMES DAILY AND 2 TABLETS BEFORE BEDTIME 150 tablet 0  . hydrALAZINE (APRESOLINE) 10 MG tablet Take 1 tablet (10 mg total) by mouth 3 (three) times daily as needed (for blood pressure greater than 140/90). 90 tablet 5  . Multiple Vitamins-Minerals (MULTIVITAMIN WITH MINERALS) tablet Take 1 tablet by mouth daily.    Marland Kitchen POTASSIUM CHLORIDE PO Take by mouth. Reported on 09/03/2015    . metroNIDAZOLE (FLAGYL) 500 MG tablet Take 4 tablets (2,000 mg total) by mouth once. 21 tablet 0   No facility-administered medications prior to visit.    PAST MEDICAL HISTORY: Past Medical History  Diagnosis Date  . Hypertension   . Strachan's syndrome   . History of alcoholism ([place])   . Malnutrition ([place])     PAST SURGICAL HISTORY: Past Surgical History  Procedure Laterality Date  . Tubal ligation    . Sural nerve bx Right 12/24/2013    Procedure: SURAL NERVE BIOPSY;  Surgeon: Kristeen Miss, MD;  Location:  [facility];  Service: Neurosurgery;  Laterality: Right;  . Multiple extractions with alveoloplasty N/A 12/25/2013    Procedure: Extraction of tooth #'s 2,3,4,5,6,8,9,10,11,12,13,14,15,20,21,22,23,24,25,26,27, [phone] with alveoloplasty and bilateral mandibular tori reductions;  Surgeon: Lenn Cal, DDS;  Location: [facility];  Service: Oral Surgery;  Laterality: N/A;    FAMILY HISTORY: Family History  Problem Relation Age of Onset  . Diabetes Father   . Diabetes  Maternal Aunt   . Heart disease Maternal Aunt   . Diabetes Maternal Uncle   . Heart disease Maternal Uncle   . Drug abuse Maternal Grandmother   . Cancer Maternal Grandmother     intestinal  . Hypertension Maternal Grandmother   . Cancer Maternal Grandfather     lung  . Hypertension Mother   . Raynaud syndrome Mother   . Hypertension Paternal Grandfather     SOCIAL HISTORY: Social History   Social History  . Marital Status: Single    Spouse Name: N/A  . Number of Children: 1  . Years of Education: hs   Occupational History  . unemployed    Social History Main Topics  . Smoking status: Current Every Day Smoker -- 0.25 packs/day  . Smokeless tobacco: Never Used     Comment: smoking 1/2 ppd  . Alcohol Use: No     Comment: Former quit March  . Drug Use: No  . Sexual Activity: No   Other Topics Concern  . Not on file   Social History Narrative   Patient lives at home with her Friend patient is widowed.   Patient is not working at this time.   Education high school.   Right handed.   Caffeine sometimes tea.      PHYSICAL EXAM  Filed Vitals:   09/03/15 1432  BP: 148/88  Pulse: 75  Height: 5\' 5"  (1.651 m)  Weight: 123 lb (55.792 kg)   Body mass index is 20.47 kg/(m^2).  Generalized: Well developed, in no acute distress   Neurological examination  Mentation: Alert oriented to time, place, history taking. Follows all commands speech and language fluent Cranial nerve II-XII: Pupils were equal round reactive to light. Extraocular movements were full, visual field were full on confrontational test. Facial sensation and strength were normal. Uvula tongue midline. Head turning and shoulder shrug  were normal and symmetric. Motor: The motor testing reveals 5 over 5 strength of all 4 extremities. Good symmetric motor tone is noted throughout.  Sensory: Sensory testing is intact to soft touch on all 4 extremities. No evidence of extinction is noted.  Coordination:  Cerebellar testing reveals good finger-nose-finger and heel-to-shin bilaterally.  Gait and station: Gait is normal. Tandem gait is slightly unsteady. Romberg is negative. No drift is seen.  Reflexes: Deep tendon reflexes are symmetric but depressed throughout  DIAGNOSTIC DATA (LABS, IMAGING, TESTING) - I reviewed patient records, labs, notes, testing and imaging myself where available.  Lab Results  Component Value Date   WBC 12.0* 12/17/2014   HGB 13.7 12/17/2014   HCT 40.0 12/17/2014   MCV 103.6* 12/17/2014   PLT 326 12/17/2014      Component Value Date/Time   NA 135 12/17/2014 1454   K 4.2 12/17/2014 1454   CL 100 12/17/2014 1454   CO2 24 12/17/2014 1454   GLUCOSE 73 12/17/2014 1454   BUN 8 12/17/2014 1454   CREATININE 0.54 12/17/2014 1454   CREATININE 0.57 12/27/2013 0628   CALCIUM 8.8 12/17/2014 1454   PROT 7.8 12/17/2014 1454  ALBUMIN 4.3 12/17/2014 1454   AST 24 12/17/2014 1454   ALT 13 12/17/2014 1454   ALKPHOS 121* 12/17/2014 1454   BILITOT 0.3 12/17/2014 1454   GFRNONAA >89 12/17/2014 1454   GFRNONAA >90 12/27/2013 0628   GFRAA >89 12/17/2014 1454   GFRAA >90 12/27/2013 0628   Lab Results  Component Value Date   CHOL 207* 12/17/2014   HDL 60 12/17/2014   LDLCALC 122* 12/17/2014   TRIG 125 12/17/2014   CHOLHDL 3.5 12/17/2014   Lab Results  Component Value Date   HGBA1C 4.8 12/20/2013   Lab Results  Component Value Date   [id number] 12/20/2013   Lab Results  Component Value Date   TSH 3.570 12/20/2013      ASSESSMENT AND PLAN 44 y.o. year old female  has a past medical history of Hypertension; Strachan's syndrome; History of alcoholism ([place]); and Malnutrition ([place]). here with:  1. Strachan's Syndrome  Overall the patient is doing well. She will continue on gabapentin and Cymbalta. I have sent in for refills today. Patient advised that if her symptoms worsen or she develops new symptoms she should let us know. She will follow-up in 6 months  with Dr. [name].     Ward Givens, MSN, NP-C 09/03/2015, 2:48 PM [facility] [address], [place], [place] [address] [phone]

## 2015-09-17 ENCOUNTER — Other Ambulatory Visit: Payer: Self-pay | Admitting: Internal Medicine

## 2015-09-17 MED FILL — hydrALAZINE HCL 10 MG TABS: 10 | 30 days supply | Qty: 90 | Fill #0

## 2015-09-17 MED FILL — DULoxetine HCL 60 MG CPEP: 60 | 30 days supply | Qty: 60 | Fill #0

## 2015-09-17 MED FILL — AMLODIPINE BESYLATE 10 MG T: 10 | 30 days supply | Qty: 30 | Fill #5

## 2015-09-24 MED FILL — GABAPENTIN 600 MG TABLET: 600 | 25 days supply | Qty: 150 | Fill #0

## 2015-10-16 ENCOUNTER — Other Ambulatory Visit: Payer: Self-pay | Admitting: Internal Medicine

## 2015-10-16 MED FILL — hydrALAZINE HCL 10 MG TABS: 10 | 30 days supply | Qty: 90 | Fill #0

## 2015-10-16 MED FILL — DULoxetine HCL 60 MG CPEP: 60 | 30 days supply | Qty: 60 | Fill #1

## 2015-10-16 MED FILL — AMLODIPINE BESYLATE 10 MG T: 10 | 30 days supply | Qty: 30 | Fill #0

## 2015-10-22 ENCOUNTER — Emergency Department (INDEPENDENT_AMBULATORY_CARE_PROVIDER_SITE_OTHER)
Admission: EM | Admit: 2015-10-22 | Discharge: 2015-10-22 | Disposition: A | Discharge status: Home / Self Care | Payer: Medicaid Other | Source: Home / Self Care | Attending: Family Medicine | Admitting: Family Medicine

## 2015-10-22 ENCOUNTER — Encounter (HOSPITAL_COMMUNITY): Payer: Self-pay

## 2015-10-22 DIAGNOSIS — L03211 Cellulitis of face: Secondary | ICD-10-CM | POA: Diagnosis not present

## 2015-10-22 MED ORDER — BACITRACIN ZINC 500 UNIT/GM EX OINT
TOPICAL_OINTMENT | CUTANEOUS | Status: AC
  Filled 2015-10-22: qty 2.7

## 2015-10-22 MED ORDER — LIDOCAINE HCL (PF) 2 % IJ SOLN
INTRAMUSCULAR | Status: AC
  Filled 2015-10-22: qty 2

## 2015-10-22 MED ORDER — SULFAMETHOXAZOLE-TRIMETHOPRIM 800-160 MG PO TABS: 2.0000 | ORAL_TABLET | Freq: Two times a day (BID) | ORAL | Status: AC

## 2015-10-22 NOTE — ED Provider Notes (Signed)
CSN: [id number]     Arrival date & time 10/22/15  1609 History   First MD Initiated Contact with Patient 10/22/15 1823     Chief Complaint  Patient presents with  . Cellulitis   (Consider location/radiation/quality/duration/timing/severity/associated sxs/prior Treatment) The history is provided by the patient. No language interpreter was used.  Patient presents for redness/swelling on chin that has been present for the past 4 days. Was worse earlier in the week; has been applying warm compresses to the area repeatedly since then> She reports the size of the swelling has reduced significantly in the past two days.   Denies fevers or chills, no difficulties with eating or breathing.  Has had similar episodes of cellulitis in the past related to a nutritional deficiency that landed her in the hospital for nine days, years ago.   Past Medical History  Diagnosis Date  . Hypertension   . Strachan's syndrome   . History of alcoholism ([place])   . Malnutrition [facility])    Past Surgical History  Procedure Laterality Date  . Tubal ligation    . Sural nerve bx Right 12/24/2013    Procedure: SURAL NERVE BIOPSY;  Surgeon: Kristeen Miss, MD;  Location: [facility];  Service: Neurosurgery;  Laterality: Right;  . Multiple extractions with alveoloplasty N/A 12/25/2013    Procedure: Extraction of tooth #'s 2,3,4,5,6,8,9,10,11,12,13,14,15,20,21,22,23,24,25,26,27, [phone] with alveoloplasty and bilateral mandibular tori reductions;  Surgeon: Lenn Cal, DDS;  Location: [facility];  Service: Oral Surgery;  Laterality: N/A;   Family History  Problem Relation Age of Onset  . Diabetes Father   . Diabetes Maternal Aunt   . Heart disease Maternal Aunt   . Diabetes Maternal Uncle   . Heart disease Maternal Uncle   . Drug abuse Maternal Grandmother   . Cancer Maternal Grandmother     intestinal  . Hypertension Maternal Grandmother   . Cancer Maternal Grandfather     lung  . Hypertension Mother   . Raynaud  syndrome Mother   . Hypertension Paternal Grandfather    Social History  Substance Use Topics  . Smoking status: Current Every Day Smoker -- 0.25 packs/day  . Smokeless tobacco: Never Used     Comment: smoking 1/2 ppd  . Alcohol Use: No     Comment: Former quit March   OB History    No data available     Review of Systems  Constitutional: Negative for fever, chills, diaphoresis, activity change and appetite change.  HENT: Negative for trouble swallowing.   Respiratory: Negative for chest tightness, shortness of breath and wheezing.   Cardiovascular: Negative for chest pain.  All other systems reviewed and are negative.   Allergies  Review of patient's allergies indicates no known allergies.  Home Medications   Prior to Admission medications   Medication Sig Start Date End Date Taking? Authorizing Provider  amLODipine (NORVASC) 10 MG tablet Take 1 tablet (10 mg total) by mouth daily. No more refills until office visit 10/16/15   Lance Bosch, NP  DULoxetine (CYMBALTA) 60 MG capsule Take 1 capsule (60 mg total) by mouth 2 (two) times daily. 09/03/15   Ward Givens, NP  folic acid (FOLVITE) 1 MG tablet Take 1 tablet (1 mg total) by mouth daily. 12/27/13   Verlee Monte, MD  gabapentin (NEURONTIN) 600 MG tablet TAKE 1 TABLET BY MOUTH 3 TIMES DAILY AND 2 TABLETS BEFORE BEDTIME 09/03/15   Ward Givens, NP  hydrALAZINE (APRESOLINE) 10 MG tablet TAKE 1 TABLET BY MOUTH 3  TIMES DAILY. NEEDS OFFICE VISIT FOR MORE REFILLS 10/16/15   Lance Bosch, NP  Multiple Vitamins-Minerals (MULTIVITAMIN WITH MINERALS) tablet Take 1 tablet by mouth daily.    Historical Provider, MD  POTASSIUM CHLORIDE PO Take by mouth. Reported on 09/03/2015    Historical Provider, MD  sulfamethoxazole-trimethoprim (BACTRIM DS,SEPTRA DS) 800-160 MG tablet Take 2 tablets by mouth 2 (two) times daily. 10/22/15 10/29/15  Willeen Niece, MD   Meds Ordered and Administered this Visit  Medications - No data to display  BP  143/90 mmHg  Pulse 66  Temp(Src) 98.8 F (37.1 C) (Oral)  Resp 20  SpO2 99% No data found.   Physical Exam  Constitutional: She appears well-developed and well-nourished. No distress.  HENT:  Head: Normocephalic and atraumatic.  Mouth/Throat: Oropharynx is clear and moist. No oropharyngeal exudate.  2cm diameter area of erythema and fluctuance over tip of chin; draining purulent material from center.  Able to open/close jaw without difficulty. Not extending to parotids/sublingual area.   Neck: Normal range of motion. Neck supple. No tracheal deviation present. No thyromegaly present.  Pulmonary/Chest: No stridor.  Lymphadenopathy:    She has cervical adenopathy.  Skin: She is not diaphoretic.    ED Course  Procedures (including critical care time)  Labs Review Labs Reviewed - No data to display  Imaging Review No results found.   Visual Acuity Review  Right Eye Distance:   Left Eye Distance:   Bilateral Distance:    Right Eye Near:   Left Eye Near:    Bilateral Near:         MDM   1. Cellulitis of chin    Area of cellulitis with coalescence in center, draining on its own.  No signs or symptoms of systemic illness. Patient is counseled on options including transfer to ED for consideration IV abx and observation; cautious limited I&D to facilitate draining; continued warm compresses alone. The latter two of these options both with oral abx and close follow up. Counseled on likely scar formation on chin with I&D.  She opts for limited I&D today with compresses and abx by mouth.   Area prepped and draped in clean fashion, infiltrated with 2% lidocaine without epinephrine. An 11-blade scalpel used to open minimally the central aspect of fluctuance which is already naturally draining.  No additional incision is made today.  Gentle pressure expresses some purulence and sanguinous fluid.  Tolerated well, hemostasis easily achieved.  Dressed with triple antibiotic and  gauze.   Plan for follow up with PCP at [facility] in the coming 5-7 days.  Counseled on reasons for ED evaluation if worsening or systemic symptoms.   Dalbert Mayotte, MD    Willeen Niece, MD 10/22/15 [phone]

## 2015-10-22 NOTE — Discharge Instructions (Signed)
It was a pleasure to see you today.    As we discussed, begin taking the antibiotic right away:  BACTRIM DS, take 2 tablets by mouth twice daily until gone.   Return to the Emergency Department if you notice worsening swelling/redness, worsening pain, fevers/chills, troubles with breathing or eating/swallowing.  Follow up at [facility] in the coming 1 week.

## 2015-10-22 NOTE — ED Notes (Signed)
Pt has a spot on her chin that has swollen and been draining since Saturday. Spot is red and sore to the touch. Pt stated that she has had this before. Pt alert and oriented

## 2015-11-02 MED FILL — GABAPENTIN 600 MG TABLET: 600 | 25 days supply | Qty: 150 | Fill #1

## 2015-11-19 ENCOUNTER — Other Ambulatory Visit: Payer: Self-pay | Admitting: Internal Medicine

## 2015-11-19 MED FILL — hydrALAZINE HCL 10 MG TABS: 10 | 30 days supply | Qty: 90 | Fill #1

## 2015-11-20 ENCOUNTER — Other Ambulatory Visit: Payer: Self-pay | Admitting: Internal Medicine

## 2015-11-20 MED FILL — DULoxetine HCL 60 MG CPEP: 60 | 30 days supply | Qty: 60 | Fill #2

## 2015-11-30 ENCOUNTER — Other Ambulatory Visit: Payer: Self-pay | Admitting: Internal Medicine

## 2015-11-30 ENCOUNTER — Telehealth: Payer: Self-pay

## 2015-11-30 MED ORDER — AMLODIPINE BESYLATE 10 MG PO TABS: 10.0000 mg | ORAL_TABLET | Freq: Every day | ORAL | Status: DC

## 2015-11-30 MED FILL — AMLODIPINE BESYLATE 10 MG T: 10 | 30 days supply | Qty: 30 | Fill #0

## 2015-11-30 NOTE — Telephone Encounter (Signed)
Patient needs refills for amlodipine.Marland KitchenMarland KitchenMarland KitchenMarland Kitchenplease follow up with patient

## 2015-11-30 NOTE — Telephone Encounter (Signed)
Returned call to patient  Patient is aware i have refilled her amlodipine for one month Patient stated she has an appointment scheduled for this coming friday

## 2015-12-04 ENCOUNTER — Encounter: Payer: Self-pay | Admitting: Internal Medicine

## 2015-12-04 ENCOUNTER — Ambulatory Visit: Payer: Medicaid Other | Attending: Internal Medicine | Admitting: Internal Medicine

## 2015-12-04 VITALS — BP 155/93 | HR 82 | Temp 98.0°F | Resp 16 | Ht 65.0 in | Wt 121.0 lb

## 2015-12-04 DIAGNOSIS — K219 Gastro-esophageal reflux disease without esophagitis: Secondary | ICD-10-CM | POA: Diagnosis not present

## 2015-12-04 DIAGNOSIS — I10 Essential (primary) hypertension: Secondary | ICD-10-CM | POA: Diagnosis not present

## 2015-12-04 MED ORDER — AMLODIPINE BESYLATE 10 MG PO TABS: 10.0000 mg | ORAL_TABLET | Freq: Every day | ORAL | Status: DC

## 2015-12-04 MED ORDER — OMEPRAZOLE 20 MG PO CPDR: 20.0000 mg | DELAYED_RELEASE_CAPSULE | Freq: Every day | ORAL | Status: DC

## 2015-12-04 MED ORDER — HYDRALAZINE HCL 10 MG PO TABS: ORAL_TABLET | ORAL | Status: DC

## 2015-12-04 MED FILL — OMEPRAZOLE DR 20 MG CAPSULE: 20 | 30 days supply | Qty: 30 | Fill #0

## 2015-12-04 MED FILL — GABAPENTIN 600 MG TABLET: 600 | 30 days supply | Qty: 150 | Fill #2

## 2015-12-04 NOTE — Progress Notes (Signed)
Patient ID: [name], female   DOB: 1971-01-24, 45 y.o.   MRN: [id number] Subjective:  [name] is a 45 y.o. female with hypertension. Current Outpatient Prescriptions  Medication Sig Dispense Refill  . amLODipine (NORVASC) 10 MG tablet Take 1 tablet (10 mg total) by mouth daily. No more refills until office visit 30 tablet 0  . DULoxetine (CYMBALTA) 60 MG capsule Take 1 capsule (60 mg total) by mouth 2 (two) times daily. 60 capsule 6  . gabapentin (NEURONTIN) 600 MG tablet TAKE 1 TABLET BY MOUTH 3 TIMES DAILY AND 2 TABLETS BEFORE BEDTIME 150 tablet 6  . hydrALAZINE (APRESOLINE) 10 MG tablet TAKE 1 TABLET BY MOUTH 3 TIMES DAILY. NEEDS OFFICE VISIT FOR MORE REFILLS 90 tablet 0  . folic acid (FOLVITE) 1 MG tablet Take 1 tablet (1 mg total) by mouth daily.    . Multiple Vitamins-Minerals (MULTIVITAMIN WITH MINERALS) tablet Take 1 tablet by mouth daily.    Marland Kitchen POTASSIUM CHLORIDE PO Take by mouth. Reported on 09/03/2015     No current facility-administered medications for this visit.    Hypertension ROS: taking medications as instructed, no medication side effects noted, no TIA's, no chest pain on exertion, no dyspnea on exertion, no swelling of ankles and no palpitations.   Objective:  BP 155/93 mmHg  Pulse 82  Temp(Src) 98 F (36.7 C)  Resp 16  Ht 5\' 5"  (1.651 m)  Wt 121 lb (54.885 kg)  BMI 20.14 kg/m2  SpO2 100%  Appearance alert, well appearing, and in no distress, oriented to person, place, and time and normal appearing weight. General exam BP noted to be well controlled today in office, S1, S2 normal, no gallop, no murmur, chest clear, no JVD, no HSM, no edema.  Lab review: labs are reviewed, up to date and normal.   Assessment:   Mariade was seen today for medication refill.  Diagnoses and all orders for this visit:  Essential hypertension -     amLODipine (NORVASC) 10 MG tablet; Take 1 tablet (10 mg total) by mouth daily. No more refills until office visit -     hydrALAZINE  (APRESOLINE) 10 MG tablet; TAKE 1 TABLET BY MOUTH 3 TIMES DAILY. NEEDS OFFICE VISIT FOR MORE REFILLS BP elevated because she has been out of medication for a period of time.   Gastroesophageal reflux disease, esophagitis presence not specified -     omeprazole (PRILOSEC) 20 MG capsule; Take 1 capsule (20 mg total) by mouth daily. Stable, meds refilled   Plan:  Reviewed diet, exercise and weight control. Recommended sodium restriction. Copy of written low fat low cholesterol diet provided and reviewed. Use of aspirin to prevent MI and TIA's discussed.   Return in about 6 months (around 06/05/2016) for Hypertension.  Lance Bosch, NP 12/07/2015 9:05 AM

## 2015-12-04 NOTE — Progress Notes (Signed)
Patient here for follow up and for medication refills

## 2015-12-21 MED FILL — DULoxetine HCL 60 MG CPEP: 60 | 30 days supply | Qty: 60 | Fill #3

## 2015-12-21 MED FILL — hydrALAZINE HCL 10 MG TABS: 10 | 30 days supply | Qty: 90 | Fill #0

## 2015-12-29 MED FILL — GABAPENTIN 600 MG TABLET: 600 | 30 days supply | Qty: 150 | Fill #3

## 2015-12-29 MED FILL — OMEPRAZOLE DR 20 MG CAPSULE: 20 | 30 days supply | Qty: 30 | Fill #1

## 2015-12-29 MED FILL — AMLODIPINE BESYLATE 10 MG T: 10 | 30 days supply | Qty: 30 | Fill #0

## 2016-01-21 MED FILL — DULoxetine HCL 60 MG CPEP: 60 | 30 days supply | Qty: 60 | Fill #4

## 2016-01-21 MED FILL — hydrALAZINE HCL 10 MG TABS: 10 | 30 days supply | Qty: 90 | Fill #1

## 2016-02-03 MED FILL — GABAPENTIN 600 MG TABLET: 600 | 30 days supply | Qty: 150 | Fill #4

## 2016-02-03 MED FILL — AMLODIPINE BESYLATE 10 MG T: 10 | 30 days supply | Qty: 30 | Fill #1

## 2016-02-03 MED FILL — OMEPRAZOLE DR 20 MG CAPSULE: 20 | 30 days supply | Qty: 30 | Fill #2

## 2016-02-23 MED FILL — DULoxetine HCL 60 MG CPEP: 60 | 30 days supply | Qty: 60 | Fill #5

## 2016-02-23 MED FILL — hydrALAZINE HCL 10 MG TABS: 10 | 30 days supply | Qty: 90 | Fill #2

## 2016-03-04 MED FILL — OMEPRAZOLE DR 20 MG CAPSULE: 20 | 30 days supply | Qty: 30 | Fill #3

## 2016-03-04 MED FILL — AMLODIPINE BESYLATE 10 MG T: 10 | 30 days supply | Qty: 30 | Fill #2

## 2016-03-04 MED FILL — GABAPENTIN 600 MG TABLET: 600 | 30 days supply | Qty: 150 | Fill #5

## 2016-03-08 ENCOUNTER — Telehealth: Payer: Self-pay

## 2016-03-08 ENCOUNTER — Ambulatory Visit: Payer: Medicaid Other | Admitting: Neurology

## 2016-03-08 NOTE — Telephone Encounter (Signed)
Pt no-showed 6 mo f/u appt this afternoon.

## 2016-03-10 ENCOUNTER — Encounter: Payer: Self-pay | Admitting: Neurology

## 2016-03-25 MED FILL — hydrALAZINE HCL 10 MG TABS: 10 | 30 days supply | Qty: 90 | Fill #3

## 2016-03-25 MED FILL — DULoxetine HCL 60 MG CPEP: 60 | 30 days supply | Qty: 60 | Fill #6

## 2016-03-31 MED FILL — AMLODIPINE BESYLATE 10 MG T: 10 | 30 days supply | Qty: 30 | Fill #3

## 2016-03-31 MED FILL — GABAPENTIN 600 MG TABLET: 600 | 30 days supply | Qty: 150 | Fill #6

## 2016-03-31 MED FILL — OMEPRAZOLE DR 20 MG CAPSULE: 20 | 30 days supply | Qty: 30 | Fill #4

## 2016-04-22 ENCOUNTER — Other Ambulatory Visit: Payer: Self-pay | Admitting: Adult Health

## 2016-04-22 MED FILL — hydrALAZINE HCL 10 MG TABS: 10 | 30 days supply | Qty: 90 | Fill #4

## 2016-04-22 MED FILL — DULoxetine HCL 60 MG CPEP: 60 | 30 days supply | Qty: 60 | Fill #0

## 2016-04-28 ENCOUNTER — Other Ambulatory Visit: Payer: Self-pay | Admitting: Adult Health

## 2016-04-28 MED FILL — OMEPRAZOLE DR 20 MG CAPSULE: 20 | 30 days supply | Qty: 30 | Fill #5

## 2016-04-28 MED FILL — AMLODIPINE BESYLATE 10 MG T: 10 | 30 days supply | Qty: 30 | Fill #4

## 2016-04-29 MED FILL — GABAPENTIN 600 MG TABLET: 600 | 30 days supply | Qty: 150 | Fill #0

## 2016-05-11 ENCOUNTER — Ambulatory Visit: Payer: Medicaid Other | Admitting: Neurology

## 2016-05-11 ENCOUNTER — Telehealth: Payer: Self-pay | Admitting: *Deleted

## 2016-05-11 ENCOUNTER — Telehealth: Payer: Self-pay | Admitting: Neurology

## 2016-05-11 NOTE — Telephone Encounter (Signed)
No showed follow up appointment. 

## 2016-05-11 NOTE — Telephone Encounter (Signed)
This patient has no showed for a revisit appointment today, this represents the third no-show within the last 12 months, the patient will be discharged from our practice.  The patient no showed for revisit on 06/04/2015, on 03/08/2016, and on 05/11/2016.

## 2016-05-12 ENCOUNTER — Encounter: Payer: Self-pay | Admitting: Neurology

## 2016-05-25 ENCOUNTER — Other Ambulatory Visit: Payer: Self-pay | Admitting: Neurology

## 2016-05-25 MED FILL — hydrALAZINE HCL 10 MG TABS: 10 | 30 days supply | Qty: 90 | Fill #5

## 2016-05-26 ENCOUNTER — Telehealth: Payer: Self-pay | Admitting: Internal Medicine

## 2016-05-26 MED ORDER — DULOXETINE HCL 60 MG PO CPEP: 60.0000 mg | ORAL_CAPSULE | Freq: Two times a day (BID) | ORAL | 0 refills | Status: DC

## 2016-05-26 MED FILL — DULoxetine HCL 60 MG CPEP: 60 | 30 days supply | Qty: 60 | Fill #0

## 2016-05-26 NOTE — Telephone Encounter (Signed)
Refilled x 30 days to allow patient to get appointment to see provider.

## 2016-05-26 NOTE — Telephone Encounter (Signed)
Patient needs refill for cymbalta. Patient has been unable to get appointment Please follow up.

## 2016-05-31 ENCOUNTER — Other Ambulatory Visit: Payer: Self-pay | Admitting: Internal Medicine

## 2016-05-31 ENCOUNTER — Other Ambulatory Visit: Payer: Self-pay | Admitting: Adult Health

## 2016-05-31 DIAGNOSIS — K219 Gastro-esophageal reflux disease without esophagitis: Secondary | ICD-10-CM

## 2016-05-31 MED FILL — AMLODIPINE BESYLATE 10 MG T: 10 | 30 days supply | Qty: 30 | Fill #5

## 2016-06-01 ENCOUNTER — Other Ambulatory Visit: Payer: Self-pay | Admitting: Adult Health

## 2016-06-01 ENCOUNTER — Telehealth: Payer: Self-pay | Admitting: Internal Medicine

## 2016-06-01 MED ORDER — GABAPENTIN 600 MG PO TABS: ORAL_TABLET | ORAL | 0 refills | Status: DC

## 2016-06-01 MED FILL — OMEPRAZOLE DR 20 MG CAPSULE: 20 | 30 days supply | Qty: 30 | Fill #0

## 2016-06-01 NOTE — Telephone Encounter (Signed)
Pt called inquiring if medication is going to be approved

## 2016-06-01 NOTE — Telephone Encounter (Signed)
Pt has been dismissed due to 3 no show appts.  Has received a refill from her pcp today for 150 tabs.

## 2016-06-01 NOTE — Telephone Encounter (Signed)
Patient needs refill gabapentin (NEURONTIN) 600 MG tablet. Waiting on specialist to give [place] the ok. Pt wants to know if she can get a one day supply. Please follow up.  Thank you.

## 2016-06-01 NOTE — Telephone Encounter (Signed)
I do not expect patient to receive a refill on gabapentin from neurology - she was discharged from the practice due to multiple no-show visits. I will refill the gabapentin for a short supply but she must come in for an appointment with a provider - no more refills after this.

## 2016-06-03 ENCOUNTER — Other Ambulatory Visit: Payer: Self-pay | Admitting: Adult Health

## 2016-06-06 ENCOUNTER — Telehealth: Payer: Self-pay | Admitting: Neurology

## 2016-06-06 ENCOUNTER — Other Ambulatory Visit: Payer: Self-pay | Admitting: Adult Health

## 2016-06-06 NOTE — Telephone Encounter (Signed)
Patient received refill on 06/01/16 from Dr. Doreene Burke. She has been dismissed from [facility] d/t 3 no-shows w/in 1 yr.

## 2016-06-06 NOTE — Telephone Encounter (Signed)
Pt called in for a medrefill on on gabapentin (NEURONTIN) 600 MG tablet. Pt was prescribed gabapentin on 9/20 by the [facility]. The pt is stating they are waiting on the office to confirm. Please call pt to advise

## 2016-06-08 ENCOUNTER — Other Ambulatory Visit: Payer: Self-pay | Admitting: Adult Health

## 2016-06-08 MED ORDER — GABAPENTIN 600 MG PO TABS: ORAL_TABLET | ORAL | 0 refills | Status: DC

## 2016-06-08 MED FILL — GABAPENTIN 600 MG TABLET: 600 | 30 days supply | Qty: 150 | Fill #0

## 2016-06-08 NOTE — Telephone Encounter (Signed)
Spoke to pharmacist who reports that they did not receive rx from Dr. Doreene Burke on 06/01/16. E-scribed 30 day refill of gabapentin but this will no longer be filled by [facility] d/t pt's dismissal from practice.

## 2016-06-08 NOTE — Telephone Encounter (Signed)
Patient called to request refill of GABAPENTIN, states she was advised in dismissal letter that prescriptions will be filled up to 30 days from dismissal and that she has been trying to get this medication filled since September 17th or 18th.

## 2016-06-08 NOTE — Addendum Note (Signed)
Addended by: Monte Fantasia on: 06/08/2016 10:55 AM   Modules accepted: Orders

## 2016-06-13 ENCOUNTER — Ambulatory Visit: Payer: Self-pay | Admitting: Family Medicine

## 2016-06-27 ENCOUNTER — Other Ambulatory Visit: Payer: Self-pay | Admitting: Internal Medicine

## 2016-06-27 DIAGNOSIS — I10 Essential (primary) hypertension: Secondary | ICD-10-CM

## 2016-06-27 DIAGNOSIS — K219 Gastro-esophageal reflux disease without esophagitis: Secondary | ICD-10-CM

## 2016-06-27 MED FILL — DULoxetine HCL 60 MG CPEP: 60 | 30 days supply | Qty: 60 | Fill #0

## 2016-06-27 MED FILL — OMEPRAZOLE DR 20 MG CAPSULE: 20 | 30 days supply | Qty: 30 | Fill #0

## 2016-06-27 MED FILL — AMLODIPINE BESYLATE 10 MG T: 10 | 30 days supply | Qty: 30 | Fill #0

## 2016-07-05 ENCOUNTER — Other Ambulatory Visit: Payer: Self-pay | Admitting: Neurology

## 2016-07-05 ENCOUNTER — Other Ambulatory Visit: Payer: Self-pay | Admitting: Internal Medicine

## 2016-07-05 DIAGNOSIS — I10 Essential (primary) hypertension: Secondary | ICD-10-CM

## 2016-07-06 ENCOUNTER — Ambulatory Visit: Payer: Self-pay | Admitting: Family Medicine

## 2016-07-06 MED FILL — hydrALAZINE HCL 10 MG TABS: 10 | 30 days supply | Qty: 90 | Fill #0

## 2016-07-19 ENCOUNTER — Other Ambulatory Visit: Payer: Self-pay | Admitting: Family Medicine

## 2016-07-19 ENCOUNTER — Ambulatory Visit: Payer: Medicaid Other | Attending: Family Medicine | Admitting: Family Medicine

## 2016-07-19 ENCOUNTER — Encounter: Payer: Self-pay | Admitting: Family Medicine

## 2016-07-19 VITALS — BP 128/73 | HR 75 | Temp 97.3°F | Ht 65.0 in | Wt 120.4 lb

## 2016-07-19 DIAGNOSIS — Q8789 Other specified congenital malformation syndromes, not elsewhere classified: Secondary | ICD-10-CM

## 2016-07-19 DIAGNOSIS — G6289 Other specified polyneuropathies: Secondary | ICD-10-CM | POA: Diagnosis not present

## 2016-07-19 DIAGNOSIS — G629 Polyneuropathy, unspecified: Secondary | ICD-10-CM | POA: Diagnosis not present

## 2016-07-19 DIAGNOSIS — R112 Nausea with vomiting, unspecified: Secondary | ICD-10-CM | POA: Diagnosis not present

## 2016-07-19 DIAGNOSIS — Z9889 Other specified postprocedural states: Secondary | ICD-10-CM | POA: Insufficient documentation

## 2016-07-19 DIAGNOSIS — M792 Neuralgia and neuritis, unspecified: Secondary | ICD-10-CM

## 2016-07-19 DIAGNOSIS — L600 Ingrowing nail: Secondary | ICD-10-CM | POA: Diagnosis not present

## 2016-07-19 DIAGNOSIS — Z0001 Encounter for general adult medical examination with abnormal findings: Secondary | ICD-10-CM | POA: Insufficient documentation

## 2016-07-19 DIAGNOSIS — Z79899 Other long term (current) drug therapy: Secondary | ICD-10-CM | POA: Diagnosis not present

## 2016-07-19 DIAGNOSIS — K219 Gastro-esophageal reflux disease without esophagitis: Secondary | ICD-10-CM | POA: Diagnosis not present

## 2016-07-19 DIAGNOSIS — I10 Essential (primary) hypertension: Secondary | ICD-10-CM | POA: Insufficient documentation

## 2016-07-19 DIAGNOSIS — L308 Other specified dermatitis: Secondary | ICD-10-CM

## 2016-07-19 DIAGNOSIS — H53009 Unspecified amblyopia, unspecified eye: Secondary | ICD-10-CM

## 2016-07-19 LAB — LIPID PANEL
Cholesterol: 186 mg/dL (ref <200)
HDL: 47 mg/dL — ABNORMAL LOW (ref >50)
LDL Cholesterol: 113 mg/dL — ABNORMAL HIGH
Total CHOL/HDL Ratio: 4 Ratio (ref <5.0)
Triglycerides: 128 mg/dL (ref <150)
VLDL: 26 mg/dL (ref <30)

## 2016-07-19 LAB — COMPLETE METABOLIC PANEL WITH GFR
ALT: 45 U/L — ABNORMAL HIGH (ref 6–29)
AST: 108 U/L — ABNORMAL HIGH (ref 10–35)
Albumin: 4.1 g/dL (ref 3.6–5.1)
Alkaline Phosphatase: 136 U/L — ABNORMAL HIGH (ref 33–115)
BUN: 4 mg/dL — ABNORMAL LOW (ref 7–25)
CO2: 23 mmol/L (ref 20–31)
Calcium: 9.5 mg/dL (ref 8.6–10.2)
Chloride: 98 mmol/L (ref 98–110)
Creat: 0.7 mg/dL (ref 0.50–1.10)
GFR, Est African American: >89 mL/min (ref >=60)
GFR, Est Non African American: >89 mL/min (ref >=60)
Glucose, Bld: 91 mg/dL (ref 65–99)
Potassium: 3.4 mmol/L — ABNORMAL LOW (ref 3.5–5.3)
Sodium: 134 mmol/L — ABNORMAL LOW (ref 135–146)
Total Bilirubin: 0.5 mg/dL (ref 0.2–1.2)
Total Protein: 7.8 g/dL (ref 6.1–8.1)

## 2016-07-19 MED ORDER — PROMETHAZINE HCL 25 MG PO TABS: 25.0000 mg | ORAL_TABLET | Freq: Three times a day (TID) | ORAL | 0 refills | Status: DC | PRN

## 2016-07-19 MED ORDER — CEPHALEXIN 500 MG PO CAPS: 500.0000 mg | ORAL_CAPSULE | Freq: Two times a day (BID) | ORAL | 0 refills | Status: DC

## 2016-07-19 MED ORDER — DULOXETINE HCL 60 MG PO CPEP: 60.0000 mg | ORAL_CAPSULE | Freq: Two times a day (BID) | ORAL | 0 refills | Status: DC

## 2016-07-19 MED ORDER — OMEPRAZOLE 20 MG PO CPDR: 20.0000 mg | DELAYED_RELEASE_CAPSULE | Freq: Every day | ORAL | 3 refills | Status: DC

## 2016-07-19 MED ORDER — AMLODIPINE BESYLATE 10 MG PO TABS: ORAL_TABLET | ORAL | 5 refills | Status: DC

## 2016-07-19 MED ORDER — GABAPENTIN 600 MG PO TABS: ORAL_TABLET | ORAL | 0 refills | Status: DC

## 2016-07-19 MED ORDER — HYDRALAZINE HCL 10 MG PO TABS: 10.0000 mg | ORAL_TABLET | Freq: Three times a day (TID) | ORAL | 5 refills | Status: DC

## 2016-07-19 MED FILL — GABAPENTIN 600 MG TABLET: 600 | 30 days supply | Qty: 90 | Fill #0

## 2016-07-19 MED FILL — CEPHALEXIN 500 MG CAPSULE: 500 | 10 days supply | Qty: 20 | Fill #0

## 2016-07-19 MED FILL — DULoxetine HCL 60 MG CPEP: 60 | 30 days supply | Qty: 60 | Fill #0

## 2016-07-19 MED FILL — OMEPRAZOLE DR 20 MG CAPSULE: 20 | 30 days supply | Qty: 30 | Fill #0

## 2016-07-19 MED FILL — AMLODIPINE BESYLATE 10 MG T: 10 | 30 days supply | Qty: 30 | Fill #0

## 2016-07-19 MED FILL — PROMETHAZINE 25 MG TABLET: 25 | 6 days supply | Qty: 20 | Fill #0

## 2016-07-19 NOTE — Patient Instructions (Signed)
Hypertension Hypertension, commonly called high blood pressure, is when the force of blood pumping through your arteries is too strong. Your arteries are the blood vessels that carry blood from your heart throughout your body. A blood pressure reading consists of a higher number over a lower number, such as 110/72. The higher number (systolic) is the pressure inside your arteries when your heart pumps. The lower number (diastolic) is the pressure inside your arteries when your heart relaxes. Ideally you want your blood pressure below 120/80. Hypertension forces your heart to work harder to pump blood. Your arteries may become narrow or stiff. Having untreated or uncontrolled hypertension can cause heart attack, stroke, kidney disease, and other problems. RISK FACTORS Some risk factors for high blood pressure are controllable. Others are not.  Risk factors you cannot control include:   Race. You may be at higher risk if you are African American.  Age. Risk increases with age.  Gender. Men are at higher risk than women before age 45 years. After age 65, women are at higher risk than men. Risk factors you can control include:  Not getting enough exercise or physical activity.  Being overweight.  Getting too much fat, sugar, calories, or salt in your diet.  Drinking too much alcohol. SIGNS AND SYMPTOMS Hypertension does not usually cause signs or symptoms. Extremely high blood pressure (hypertensive crisis) may cause headache, anxiety, shortness of breath, and nosebleed. DIAGNOSIS To check if you have hypertension, your health care provider will measure your blood pressure while you are seated, with your arm held at the level of your heart. It should be measured at least twice using the same arm. Certain conditions can cause a difference in blood pressure between your right and left arms. A blood pressure reading that is higher than normal on one occasion does not mean that you need treatment. If  it is not clear whether you have high blood pressure, you may be asked to return on a different day to have your blood pressure checked again. Or, you may be asked to monitor your blood pressure at home for 1 or more weeks. TREATMENT Treating high blood pressure includes making lifestyle changes and possibly taking medicine. Living a healthy lifestyle can help lower high blood pressure. You may need to change some of your habits. Lifestyle changes may include:  Following the DASH diet. This diet is high in fruits, vegetables, and whole grains. It is low in salt, red meat, and added sugars.  Keep your sodium intake below 2,300 mg per day.  Getting at least 30-45 minutes of aerobic exercise at least 4 times per week.  Losing weight if necessary.  Not smoking.  Limiting alcoholic beverages.  Learning ways to reduce stress. Your health care provider may prescribe medicine if lifestyle changes are not enough to get your blood pressure under control, and if one of the following is true:  You are 18-59 years of age and your systolic blood pressure is above 140.  You are 60 years of age or older, and your systolic blood pressure is above 150.  Your diastolic blood pressure is above 90.  You have diabetes, and your systolic blood pressure is over 140 or your diastolic blood pressure is over 90.  You have kidney disease and your blood pressure is above 140/90.  You have heart disease and your blood pressure is above 140/90. Your personal target blood pressure may vary depending on your medical conditions, your age, and other factors. HOME CARE INSTRUCTIONS    Have your blood pressure rechecked as directed by your health care provider.   Take medicines only as directed by your health care provider. Follow the directions carefully. Blood pressure medicines must be taken as prescribed. The medicine does not work as well when you skip doses. Skipping doses also puts you at risk for  problems.  Do not smoke.   Monitor your blood pressure at home as directed by your health care provider. SEEK MEDICAL CARE IF:   You think you are having a reaction to medicines taken.  You have recurrent headaches or feel dizzy.  You have swelling in your ankles.  You have trouble with your vision. SEEK IMMEDIATE MEDICAL CARE IF:  You develop a severe headache or confusion.  You have unusual weakness, numbness, or feel faint.  You have severe chest or abdominal pain.  You vomit repeatedly.  You have trouble breathing. MAKE SURE YOU:   Understand these instructions.  Will watch your condition.  Will get help right away if you are not doing well or get worse.   This information is not intended to replace advice given to you by your health care provider. Make sure you discuss any questions you have with your health care provider.   Document Released: 08/29/2005 Document Revised: 01/13/2015 Document Reviewed: 06/21/2013 Elsevier Interactive Patient Education 2016 [employer]  

## 2016-07-19 NOTE — Progress Notes (Signed)
Med refills Broke down crying-"tired of feeling so bad"

## 2016-07-19 NOTE — Progress Notes (Signed)
Subjective:  Patient ID: [name], female    DOB: 07-Sep-1971  Age: 45 y.o. 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 She was previously followed by [facility] but was discharged due to the fact that she missed several appointments. She suffers from peripheral neuropathy for which she takes Cymbalta and gabapentin but has run out of her gabapentin.  She has been compliant with antihypertensives and is requesting refills; reflux symptoms have been controlled on omeprazole.  Presents with one week history of nausea and vomiting, being unable to keep anything down. Denies fevers, abdominal pain, constipation or diarrhea. Denies history of sick contacts.  Past Medical History:  Diagnosis Date  . History of alcoholism ([place])   . Hypertension   . Malnutrition ([place])   . Strachan's syndrome [facility])     Past Surgical History:  Procedure Laterality Date  . MULTIPLE EXTRACTIONS WITH ALVEOLOPLASTY N/A 12/25/2013   Procedure: Extraction of tooth #'s 2,3,4,5,6,8,9,10,11,12,13,14,15,20,21,22,23,24,25,26,27, [phone] with alveoloplasty and bilateral mandibular tori reductions;  Surgeon: Lenn Cal, DDS;  Location: [facility];  Service: Oral Surgery;  Laterality: N/A;  . SURAL NERVE BX Right 12/24/2013   Procedure: SURAL NERVE BIOPSY;  Surgeon: Kristeen Miss, MD;  Location: [facility];  Service: Neurosurgery;  Laterality: Right;  . TUBAL LIGATION      No Known Allergies   Outpatient Medications Prior to Visit  Medication Sig Dispense Refill  . Multiple Vitamins-Minerals (MULTIVITAMIN WITH MINERALS) tablet Take 1 tablet by mouth daily.    Marland Kitchen amLODipine (NORVASC) 10 MG tablet TAKE 1 TABLET BY MOUTH DAILY. NO MORE REFILLS  UNTIL OFFICE VISIT 30 tablet 0  . DULoxetine (CYMBALTA) 60 MG capsule TAKE ONE CAPSULE BY MOUTH TWICE A DAY 60 capsule 0  . hydrALAZINE (APRESOLINE) 10 MG tablet Take 1 tablet (10 mg total) by mouth 3 (three) times daily. 90 tablet 0  . omeprazole (PRILOSEC) 20 MG capsule TAKE 1 CAPSULE BY MOUTH DAILY. 30 capsule 0  . folic acid (FOLVITE) 1 MG tablet Take 1 tablet (1 mg total) by mouth daily. (Patient not taking: Reported on 07/19/2016)    . POTASSIUM CHLORIDE PO Take by mouth. Reported on 09/03/2015    . gabapentin (NEURONTIN) 600 MG tablet TAKE 1 TABLET BY MOUTH 3 TIMES DAILY AND 2 TABLETS BEFORE BEDTIME (Patient not taking: Reported on 07/19/2016) 150 tablet 0   No facility-administered medications prior to visit.     ROS Review of Systems  Constitutional: Negative for activity change, appetite change and fatigue.  HENT: Negative for congestion, sinus pressure and sore throat.   Eyes: Negative for visual disturbance.  Respiratory: Negative for cough, chest tightness, shortness of breath and wheezing.   Cardiovascular: Negative for chest pain and palpitations.  Gastrointestinal: Positive for nausea and vomiting. Negative for abdominal distention, abdominal pain and constipation.  Endocrine: Negative for polydipsia.  Genitourinary: Negative for dysuria and frequency.  Musculoskeletal: Negative for arthralgias and back pain.  Skin: Negative for rash.  Neurological: Positive for numbness. Negative for tremors and light-headedness.  Hematological: Does not bruise/bleed easily.  Psychiatric/Behavioral: Negative for agitation and behavioral problems.    Objective:  BP 128/73 (BP Location: Right Arm, Patient Position: Sitting, Cuff Size: Small)   Pulse 75   Temp  97.3 F (36.3 C) (Oral)   Ht 5\' 5"  (1.651 m)   Wt 120 lb 6.4 oz (54.6 kg)   SpO2 99%   BMI 20.04 kg/m   BP/Weight 07/19/2016 [id number]  Systolic BP 0000000 [id number]  Diastolic BP 73 93 90  Wt. (Lbs) 120.4 121 -  BMI  20.04 20.14 -      Physical Exam  Constitutional: She is oriented to person, place, and time. She appears well-developed and well-nourished.  Cardiovascular: Normal rate, normal heart sounds and intact distal pulses.   No murmur heard. Pulmonary/Chest: Effort normal and breath sounds normal. She has no wheezes. She has no rales. She exhibits no tenderness.  Abdominal: Soft. Bowel sounds are normal. She exhibits no distension and no mass. There is no tenderness.  Musculoskeletal: She exhibits tenderness (Left peak total ingrown toenail with surrounding erythema and mild tenderness of skin).  Neurological: She is alert and oriented to person, place, and time.  Skin: Skin is warm and dry.  Psychiatric: She has a normal mood and affect.     Assessment & Plan:   1. Essential hypertension Controlled - amLODipine (NORVASC) 10 MG tablet; TAKE 1 TABLET BY MOUTH DAILY  Dispense: 30 tablet; Refill: 5 - hydrALAZINE (APRESOLINE) 10 MG tablet; Take 1 tablet (10 mg total) by mouth 3 (three) times daily.  Dispense: 90 tablet; Refill: 5 - COMPLETE METABOLIC PANEL WITH GFR - Lipid panel  2. Gastroesophageal reflux disease, esophagitis presence not specified Controlled - omeprazole (PRILOSEC) 20 MG capsule; Take 1 capsule (20 mg total) by mouth daily.  Dispense: 30 capsule; Refill: 3  3. Ingrown toenail Advised to perform warm soaks - Ambulatory referral to Podiatry - cephALEXin (KEFLEX) 500 MG capsule; Take 1 capsule (500 mg total) by mouth 2 (two) times daily.  Dispense: 20 capsule; Refill: 0  4. Other polyneuropathy ([place]) Uncontrolled Referred to Neurology as she was discharged from [facility] - DULoxetine (CYMBALTA) 60 MG capsule; Take 1 capsule (60 mg total) by mouth 2 (two) times daily.  Dispense: 60 capsule; Refill: 0 - gabapentin (NEURONTIN) 600 MG tablet; TAKE 1 TABLET BY MOUTH 3 TIMES DAILY  Dispense: 90 tablet; Refill: 0 - Ambulatory referral to Neurology  5.  Strachan's syndrome ([place]) - Ambulatory referral to Neurology  6. Non-intractable vomiting with nausea, unspecified vomiting type Could be viral  Placed on Promethazine   Meds ordered this encounter  Medications  . DULoxetine (CYMBALTA) 60 MG capsule    Sig: Take 1 capsule (60 mg total) by mouth 2 (two) times daily.    Dispense:  60 capsule    Refill:  0  . amLODipine (NORVASC) 10 MG tablet    Sig: TAKE 1 TABLET BY MOUTH DAILY    Dispense:  30 tablet    Refill:  5  . gabapentin (NEURONTIN) 600 MG tablet    Sig: TAKE 1 TABLET BY MOUTH 3 TIMES DAILY    Dispense:  90 tablet    Refill:  0  . hydrALAZINE (APRESOLINE) 10 MG tablet    Sig: Take 1 tablet (10 mg total) by mouth 3 (three) times daily.    Dispense:  90 tablet    Refill:  5  . omeprazole (PRILOSEC) 20 MG capsule    Sig: Take 1 capsule (20 mg total) by mouth daily.    Dispense:  30 capsule    Refill:  3  . promethazine (PHENERGAN) 25 MG tablet    Sig: Take 1 tablet (25 mg total) by  mouth every 8 (eight) hours as needed for nausea or vomiting.    Dispense:  20 tablet    Refill:  0  . cephALEXin (KEFLEX) 500 MG capsule    Sig: Take 1 capsule (500 mg total) by mouth 2 (two) times daily.    Dispense:  20 capsule    Refill:  0    Follow-up: Return in about 3 months (around 10/19/2016) for follow up on Hypertension.   Arnoldo Morale MD

## 2016-07-20 ENCOUNTER — Other Ambulatory Visit: Payer: Self-pay | Admitting: Family Medicine

## 2016-07-20 DIAGNOSIS — R748 Abnormal levels of other serum enzymes: Secondary | ICD-10-CM

## 2016-07-20 LAB — GAMMA GT: GGT: 177 U/L — ABNORMAL HIGH (ref 7–51)

## 2016-07-21 LAB — HEPATITIS C ANTIBODY: HCV Ab: NEGATIVE

## 2016-07-21 LAB — HEPATITIS B SURFACE ANTIGEN: Hepatitis B Surface Ag: NEGATIVE

## 2016-07-21 LAB — HEPATITIS A ANTIBODY, TOTAL: Hep A Total Ab: NONREACTIVE

## 2016-07-22 LAB — HEPATITIS B E ANTIGEN: Hepatitis Be Antigen: NONREACTIVE

## 2016-07-25 ENCOUNTER — Encounter: Payer: Self-pay | Admitting: Podiatry

## 2016-07-25 ENCOUNTER — Ambulatory Visit (INDEPENDENT_AMBULATORY_CARE_PROVIDER_SITE_OTHER): Payer: Medicaid Other | Admitting: Podiatry

## 2016-07-25 VITALS — BP 137/91 | HR 83

## 2016-07-25 DIAGNOSIS — M79676 Pain in unspecified toe(s): Secondary | ICD-10-CM

## 2016-07-25 DIAGNOSIS — L600 Ingrowing nail: Secondary | ICD-10-CM | POA: Diagnosis not present

## 2016-07-25 DIAGNOSIS — L03039 Cellulitis of unspecified toe: Secondary | ICD-10-CM | POA: Diagnosis not present

## 2016-07-25 NOTE — Progress Notes (Signed)
Subjective: Patient presents today for evaluation of pain in her toe(s). Patient is concerned for possible ingrown nail. Patient states that the pain has been present for a few weeks now. Patient presents today for further treatment and evaluation.  Objective:  General: Well developed, nourished, in no acute distress, alert and oriented x3   Dermatology: Skin is warm, dry and supple bilateral. Medial border of the left great toe appears to be erythematous with evidence of an ingrowing nail. Purulent drainage noted with intruding nail into the respective nail fold. Pain on palpation noted to the border of the nail fold. The remaining nails appear unremarkable at this time. There are no open sores, lesions.  Vascular: Dorsalis Pedis artery and Posterior Tibial artery pedal pulses palpable. No lower extremity edema noted.   Neruologic: Grossly intact via light touch bilateral.  Musculoskeletal: Muscular strength within normal limits in all groups bilateral. Normal range of motion noted to all pedal and ankle joints.   Assesement: #1 paronychia medial border left great toe #2 ingrowing nail medial border left great toe #3 pain in left great toe   Plan of Care:  1. Patient evaluated.  2. Discussed treatment alternatives and plan of care. Explained nail avulsion procedure and post procedure course to patient. 3. Patient opted for permanent partial nail avulsion.  4. Prior to procedure, local anesthesia infiltration utilized using 3 ml of a 50:50 mixture of 2% plain lidocaine and 0.5% plain marcaine in a normal hallux block fashion and a betadine prep performed.  5. Partial permanent nail avulsion with chemical matrixectomy performed using [id number] applications of phenol followed by alcohol flush.  6. Light dressing applied. 7. Return to clinic in 2 weeks.   Dr. Edrick Kins [facility]

## 2016-07-25 NOTE — Patient Instructions (Signed)

## 2016-08-10 ENCOUNTER — Ambulatory Visit: Payer: Medicaid Other | Admitting: Podiatry

## 2016-08-16 ENCOUNTER — Other Ambulatory Visit: Payer: Self-pay | Admitting: Family Medicine

## 2016-08-16 DIAGNOSIS — G6289 Other specified polyneuropathies: Secondary | ICD-10-CM

## 2016-08-16 MED FILL — GABAPENTIN 600 MG TABLET: 600 | 30 days supply | Qty: 90 | Fill #0

## 2016-08-16 MED FILL — AMLODIPINE BESYLATE 10 MG T: 10 | 30 days supply | Qty: 30 | Fill #1

## 2016-08-16 MED FILL — DULoxetine HCL 60 MG CPEP: 60 | 30 days supply | Qty: 60 | Fill #0

## 2016-08-16 MED FILL — hydrALAZINE HCL 10 MG TABS: 10 | 30 days supply | Qty: 90 | Fill #0

## 2016-08-16 MED FILL — OMEPRAZOLE DR 20 MG CAPSULE: 20 | 30 days supply | Qty: 30 | Fill #1

## 2016-09-09 ENCOUNTER — Ambulatory Visit: Payer: Self-pay | Admitting: Neurology

## 2016-09-15 ENCOUNTER — Other Ambulatory Visit: Payer: Self-pay | Admitting: Family Medicine

## 2016-09-15 DIAGNOSIS — G6289 Other specified polyneuropathies: Secondary | ICD-10-CM

## 2016-09-15 MED FILL — hydrALAZINE HCL 10 MG TABS: 10 | 30 days supply | Qty: 90 | Fill #1

## 2016-09-15 MED FILL — GABAPENTIN 600 MG TABLET: 600 | 30 days supply | Qty: 90 | Fill #0

## 2016-09-29 ENCOUNTER — Other Ambulatory Visit: Payer: Self-pay | Admitting: Family Medicine

## 2016-09-29 DIAGNOSIS — G6289 Other specified polyneuropathies: Secondary | ICD-10-CM

## 2016-09-29 MED FILL — AMLODIPINE BESYLATE 10 MG T: 10 | 30 days supply | Qty: 30 | Fill #2

## 2016-09-29 MED FILL — OMEPRAZOLE DR 20 MG CAPSULE: 20 | 30 days supply | Qty: 30 | Fill #2

## 2016-09-30 MED FILL — DULoxetine HCL 60 MG CPEP: 60 | 30 days supply | Qty: 60 | Fill #0

## 2016-10-12 MED FILL — GABAPENTIN 600 MG TABLET: 600 | 30 days supply | Qty: 90 | Fill #1

## 2016-10-12 MED FILL — hydrALAZINE HCL 10 MG TABS: 10 | 30 days supply | Qty: 90 | Fill #2

## 2016-11-01 ENCOUNTER — Other Ambulatory Visit: Payer: Self-pay | Admitting: Family Medicine

## 2016-11-01 DIAGNOSIS — G6289 Other specified polyneuropathies: Secondary | ICD-10-CM

## 2016-11-01 MED FILL — AMLODIPINE BESYLATE 10 MG T: 10 | 30 days supply | Qty: 30 | Fill #3

## 2016-11-01 MED FILL — DULoxetine HCL 60 MG CPEP: 60 | 30 days supply | Qty: 60 | Fill #0

## 2016-11-01 MED FILL — OMEPRAZOLE DR 20 MG CAPSULE: 20 | 30 days supply | Qty: 30 | Fill #3

## 2016-11-08 MED FILL — hydrALAZINE HCL 10 MG TABS: 10 | 30 days supply | Qty: 90 | Fill #3

## 2016-11-08 MED FILL — GABAPENTIN 600 MG TABLET: 600 | 30 days supply | Qty: 90 | Fill #2

## 2016-11-16 ENCOUNTER — Other Ambulatory Visit (INDEPENDENT_AMBULATORY_CARE_PROVIDER_SITE_OTHER): Payer: Medicaid Other

## 2016-11-16 ENCOUNTER — Encounter: Payer: Self-pay | Admitting: Neurology

## 2016-11-16 ENCOUNTER — Ambulatory Visit (INDEPENDENT_AMBULATORY_CARE_PROVIDER_SITE_OTHER): Payer: Medicaid Other | Admitting: Neurology

## 2016-11-16 VITALS — BP 104/74 | HR 80 | Ht 65.0 in | Wt 123.2 lb

## 2016-11-16 DIAGNOSIS — M792 Neuralgia and neuritis, unspecified: Secondary | ICD-10-CM

## 2016-11-16 DIAGNOSIS — G6289 Other specified polyneuropathies: Secondary | ICD-10-CM

## 2016-11-16 DIAGNOSIS — G589 Mononeuropathy, unspecified: Secondary | ICD-10-CM

## 2016-11-16 LAB — FOLATE: Folate: 2.2 ng/mL — ABNORMAL LOW (ref >5.9)

## 2016-11-16 LAB — VITAMIN B12: Vitamin B-12: 119 pg/mL — ABNORMAL LOW (ref 211–911)

## 2016-11-16 NOTE — Patient Instructions (Addendum)
1.  Continue gabapentin 600mg  7am, 2pm, and 1200mg  at bedtime. 2.  Continue Cymbalta 60mg  twice daily 3.  Check blood work  Return to clinic 6 months

## 2016-11-16 NOTE — Progress Notes (Signed)
[facility] Clinic Note - Initial Visit   Date: 11/16/16  [name] MRN: [id number] DOB: 1971-04-16    Dear Dr. Charlane Ferretti:  Thank you for your kind referral of [name] for consultation of neuropathy. Although her history is well known to you, please allow [place] to reiterate it for the purpose of our medical record. The patient was accompanied to the clinic by mother who also provides collateral information.     History of Present Illness: [name] is a 46 y.o. 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    She started having tingling and weakness of the legs in 2011-12-01 and eventually sought medical attention in 30-Nov-2013 because of progressive worsening pain and weakness of the legs and malnutrition.  She was hospitalized and had extensive neurological work-up including CSF analysis, serology testing, and sural nerve biopsy.  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   She was diagnosed with alcoholic and nutritional deficiency neuropathy and was seeing Dr. [name] at [facility] for follow-up.  Her husband passed away in Dec 01, 2011 and she became depressed, which led to her drinking alcohol.  She has been sober since this time.    She noticed gradual improvement over the following year where she was able to walk again.  She continues stumble often because of lack of sensation and imbalance, but walks unassisted.  She has tingling and burning sensation involving the lower legs, especially from the knee down and the fingertips.  She was prescribed gabapentin '600mg'$  TID, previously she was taking '600mg'$  fives times per day.  She did not take the gabapentin as prescribed and was taking gabapentin '600mg'$  at 7am and  2pm, and '1200mg'$  at 10pm. She also takes Cymbalta '60mg'$  twice daily.  She has been  noncompliant with multivitamin and folic acid.  She was seeing Dr. [name] at [facility] since 2013/11/30 and was discharged in 12/01/15 due to too many no shows.   She quit drinking alcohol in Dec 01, 2014.  She was consuming 7-8 beers daily x 3 years and has been sober since then.  Out-side paper records, electronic medical record, and images have been reviewed where available and summarized as:  NCS/EMG 01/20/2014:  Nerve conduction studies done on all 4 extremities show evidence of a mild to moderate primarily axonal peripheral neuropathy. EMG evaluation of the right lower extremity was relatively unremarkable. Labs 12/2013:  ANA negative. RPR nonreactive, HIV nonreactive, ESR only at 5, rheumatoid factor negative, copper 127, Vitamin B12 at 381. Heavy metal screen is pending, SPEP no M protein CSF 12/24/2013:  CSF R1 W0 G60 P34  Lab Results  Component Value Date   ALT 45 (H) 07/19/2016   AST 108 (H) 07/19/2016   ALKPHOS 136 (H) 07/19/2016   BILITOT 0.5 07/19/2016    Lab Results  Component Value Date   TSH 3.570 12/20/2013   Lab Results  Component Value Date   HGBA1C 4.8 12/20/2013   Lab Results  Component Value Date   VITAMINB12 394 12/20/2013      Past Medical History:  Diagnosis Date  . History of alcoholism ([place])   . Hypertension   . Malnutrition ([place])   . Strachan's syndrome [facility])     Past Surgical History:  Procedure Laterality Date  . MULTIPLE EXTRACTIONS WITH ALVEOLOPLASTY N/A 12/25/2013   Procedure: Extraction of tooth #'s 2,3,4,5,6,8,9,10,11,12,13,14,15,20,21,22,23,24,25,26,27, 40,98,11,91 with alveoloplasty and bilateral mandibular tori  reductions;  Surgeon: Lenn Cal, DDS;  Location: [facility];  Service: Oral Surgery;  Laterality: N/A;  . SURAL NERVE BX Right 12/24/2013   Procedure: SURAL NERVE BIOPSY;  Surgeon: Kristeen Miss, MD;  Location: [place] [facility];  Service: Neurosurgery;  Laterality: Right;  . TUBAL LIGATION       Medications:  Outpatient Encounter Prescriptions as of  11/16/2016  Medication Sig  . amLODipine (NORVASC) 10 MG tablet TAKE 1 TABLET BY MOUTH DAILY  . DULoxetine (CYMBALTA) 60 MG capsule TAKE 1 CAPSULE BY MOUTH 2 TIMES DAILY.  . folic acid (FOLVITE) 1 MG tablet Take 1 tablet (1 mg total) by mouth daily.  Marland Kitchen gabapentin (NEURONTIN) 600 MG tablet TAKE 1 TABLET BY MOUTH 3 TIMES DAILY  . hydrALAZINE (APRESOLINE) 10 MG tablet Take 1 tablet (10 mg total) by mouth 3 (three) times daily.  . Multiple Vitamins-Minerals (MULTIVITAMIN WITH MINERALS) tablet Take 1 tablet by mouth daily.  Marland Kitchen omeprazole (PRILOSEC) 20 MG capsule Take 1 capsule (20 mg total) by mouth daily.  Marland Kitchen POTASSIUM CHLORIDE PO Take by mouth. Reported on 09/03/2015  . promethazine (PHENERGAN) 25 MG tablet Take 1 tablet (25 mg total) by mouth every 8 (eight) hours as needed for nausea or vomiting.  . [DISCONTINUED] cephALEXin (KEFLEX) 500 MG capsule Take 1 capsule (500 mg total) by mouth 2 (two) times daily.   No facility-administered encounter medications on file as of 11/16/2016.      Allergies: No Known Allergies  Family History: Family History  Problem Relation Age of Onset  . Adopted: Yes  . Diabetes Father   . Hypertension Mother   . Raynaud syndrome Mother   . Diabetes Maternal Aunt   . Heart disease Maternal Aunt   . Diabetes Maternal Uncle   . Heart disease Maternal Uncle   . Drug abuse Maternal Grandmother   . Cancer Maternal Grandmother     intestinal  . Hypertension Maternal Grandmother   . Cancer Maternal Grandfather     lung  . Hypertension Paternal Grandfather     Social History: Social History  Substance Use Topics  . Smoking status: Current Every Day Smoker    Packs/day: 0.25  . Smokeless tobacco: Never Used     Comment: smoking 1/2 ppd  . Alcohol use No     Comment: Former quit March   Social History   Social History Narrative   Patient lives at home with her Friend patient is widowed.   Patient is not working at this time.   Education high school.    Right handed.   Caffeine sometimes tea.    Review of Systems:  CONSTITUTIONAL: No fevers, chills, night sweats, or weight loss.   EYES: No visual changes or eye pain ENT: No hearing changes.  No history of nose bleeds.   RESPIRATORY: No cough, wheezing and shortness of breath.   CARDIOVASCULAR: Negative for chest pain, and palpitations.   GI: Negative for abdominal discomfort, blood in stools or black stools.  No recent change in bowel habits.   GU:  No history of incontinence.   MUSCLOSKELETAL: No history of joint pain or swelling.  No myalgias.   SKIN: Negative for lesions, rash, and itching.   HEMATOLOGY/ONCOLOGY: Negative for prolonged bleeding, bruising easily, and swollen nodes.  No history of cancer.   ENDOCRINE: Negative for cold or heat intolerance, polydipsia or goiter.   PSYCH:  +depression or anxiety symptoms.   NEURO: As Above.   Vital Signs:  BP 104/74  Pulse 80   Ht '5\' 5"'$  (1.651 m)   Wt 123 lb 3 oz (55.9 kg)   SpO2 98%   BMI 20.50 kg/m    General Medical Exam:   General:  Well appearing, comfortable.   Eyes/ENT: see cranial nerve examination.   Neck: No masses appreciated.  Full range of motion without tenderness.  No carotid bruits. Respiratory:  Clear to auscultation, good air entry bilaterally.   Cardiac:  Regular rate and rhythm, no murmur.   Extremities:  No deformities, edema, or skin discoloration.  Skin:  No rashes or lesions.  Neurological Exam: MENTAL STATUS including orientation to time, place, person, recent and remote memory, attention span and concentration, language, and fund of knowledge is normal.  Speech is not dysarthric.  CRANIAL NERVES: II:  No visual field defects.  Unremarkable fundi.   III-IV-VI: Pupils equal round and reactive to light.  Normal conjugate, extra-ocular eye movements in all directions of gaze.  No nystagmus.  No ptosis.   V:  Normal facial sensation.    VII:  Normal facial symmetry and movements. VIII:  Normal  hearing and vestibular function.   IX-X:  Normal palatal movement.   XI:  Normal shoulder shrug and head rotation.   XII:  Normal tongue strength and range of motion, no deviation or fasciculation.  MOTOR:  No atrophy, fasciculations or abnormal movements.  No pronator drift.  Tone is normal.    Right Upper Extremity:    Left Upper Extremity:    Deltoid  5/5   Deltoid  5/5   Biceps  5/5   Biceps  5/5   Triceps  5/5   Triceps  5/5   Wrist extensors  5/5   Wrist extensors  5/5   Wrist flexors  5/5   Wrist flexors  5/5   Finger extensors  5/5   Finger extensors  5/5   Finger flexors  5/5   Finger flexors  5/5   Dorsal interossei  5/5   Dorsal interossei  5/5   Abductor pollicis  5/5   Abductor pollicis  5/5   Tone (Ashworth scale)  0  Tone (Ashworth scale)  0   Right Lower Extremity:    Left Lower Extremity:    Hip flexors  5/5   Hip flexors  5/5   Hip extensors  5/5   Hip extensors  5/5   Knee flexors  5/5   Knee flexors  5/5   Knee extensors  5/5   Knee extensors  5/5   Dorsiflexors  5/5   Dorsiflexors  5/5   Plantarflexors  5/5   Plantarflexors  5/5   Toe extensors  5/5   Toe extensors  5/5   Toe flexors  5/5   Toe flexors  5/5   Tone (Ashworth scale)  0  Tone (Ashworth scale)  0   MSRs:  Right                                                                 Left brachioradialis 2+  brachioradialis 2+  biceps 2+  biceps 2+  triceps 2+  triceps 2+  patellar 2+  patellar 2+  ankle jerk 0  ankle jerk 0  Hoffman no  Hoffman no  plantar response down  plantar response down   SENSORY:  Vibration is 100% at the MCP, 30% at the knees, and absent distal to ankles bilaterally.  Pin prick, temperature, and proprioception absent below the knees. Surprisingly, Rhomberg is negative.  COORDINATION/GAIT: Normal finger-to- nose-finger and heel-to-shin.  Intact rapid alternating movements bilaterally.  Able to rise from a chair without using arms.  Gait narrow based and stable. Mild  unsteadiness with tandem and stressed gait.    IMPRESSION: Ms. 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  She has been sober since 2015 and has been somewhat noncompliant with her supplements, but has shown gradual recovery over the years.  Her motor strength is 5/5 distally and she has return of her patella reflexes, which is a good prognostic indicator.  She continues to have glove-stocking distribution of pain and paresthesias and reports having relief with gabapentin and cymbalta. I encouraged her that with her neural recovery, the intensity of her pain should also improve and keeping her on the lowest dose of gabapentin is important.    PLAN/RECOMMENDATIONS:  1.  Check vitamin B12, vitamin B1, MMA, folate 2.  Continue gabapentin '600mg'$  in the morning and afternoon and '1200mg'$  at bedtime.  Will attempt to taper this going forward. 3.  Continue Cymbalta '60mg'$  twice daily 4.  Encouraged her to maintain healthy diet and avoid alcohol  Return to clinic in 6 months.   The duration of this appointment visit was 50 minutes of face-to-face time with the patient.  Greater than 50% of this time was spent in counseling, explanation of diagnosis, planning of further management, and coordination of care.   Thank you for allowing me to participate in patient's care.  If I can answer any additional questions, I would be pleased to do so.    Sincerely,    Bridgette Wolden K. Posey Pronto, DO

## 2016-11-20 LAB — VITAMIN B1: Vitamin B1 (Thiamine): <7 nmol/L — ABNORMAL LOW (ref 8–30)

## 2016-11-21 LAB — METHYLMALONIC ACID, SERUM: Methylmalonic Acid, Quant: 216 nmol/L (ref 87–318)

## 2016-11-22 ENCOUNTER — Telehealth: Payer: Self-pay | Admitting: *Deleted

## 2016-11-22 NOTE — Telephone Encounter (Signed)
Left message for patient to call me back. 

## 2016-11-22 NOTE — Telephone Encounter (Signed)
-----   Message from Alda Berthold, DO sent at 11/22/2016 12:18 PM EDT ----- Please inform patient that her folate, vitamin B1 (thiamine), and B12 are very low and we need to start supplementation.  Start taking folic acid 1mg  and vitamin B1 100mg  daily by mouth.  Start Vitamin B12 1087mcg IM injection daily x 7 days, weekly x 4 weeks, then monthly thereafter x 1 year.  Thanks.

## 2016-11-25 ENCOUNTER — Telehealth: Payer: Self-pay | Admitting: *Deleted

## 2016-11-25 NOTE — Telephone Encounter (Signed)
Left message for patient to call me back. 

## 2016-11-25 NOTE — Telephone Encounter (Signed)
-----   Message from Alda Berthold, DO sent at 11/22/2016 12:18 PM EDT ----- Please inform patient that her folate, vitamin B1 (thiamine), and B12 are very low and we need to start supplementation.  Start taking folic acid 1mg  and vitamin B1 100mg  daily by mouth.  Start Vitamin B12 1025mcg IM injection daily x 7 days, weekly x 4 weeks, then monthly thereafter x 1 year.  Thanks.

## 2016-11-28 ENCOUNTER — Encounter: Payer: Self-pay | Admitting: *Deleted

## 2016-11-28 ENCOUNTER — Telehealth: Payer: Self-pay | Admitting: *Deleted

## 2016-11-28 NOTE — Telephone Encounter (Signed)
Letter sent requesting for patient to call our office for results and instructions.

## 2016-11-28 NOTE — Telephone Encounter (Signed)
-----   Message from Alda Berthold, DO sent at 11/22/2016 12:18 PM EDT ----- Please inform patient that her folate, vitamin B1 (thiamine), and B12 are very low and we need to start supplementation.  Start taking folic acid 1mg  and vitamin B1 100mg  daily by mouth.  Start Vitamin B12 1092mcg IM injection daily x 7 days, weekly x 4 weeks, then monthly thereafter x 1 year.  Thanks.

## 2016-11-28 NOTE — Progress Notes (Signed)
Letter sent requesting for patient to call our office for results and instructions.

## 2016-11-30 ENCOUNTER — Other Ambulatory Visit: Payer: Self-pay | Admitting: Family Medicine

## 2016-11-30 DIAGNOSIS — K219 Gastro-esophageal reflux disease without esophagitis: Secondary | ICD-10-CM

## 2016-11-30 MED FILL — OMEPRAZOLE 20 MG CAP: 20 | 30 days supply | Qty: 30 | Fill #0

## 2016-11-30 MED FILL — DULoxetine HCL 60 MG CPEP: 60 | 30 days supply | Qty: 60 | Fill #1

## 2016-11-30 MED FILL — AMLODIPINE BESYLATE 10 MG T: 10 | 30 days supply | Qty: 30 | Fill #4

## 2016-12-01 ENCOUNTER — Telehealth: Payer: Self-pay | Admitting: *Deleted

## 2016-12-01 NOTE — Telephone Encounter (Signed)
-----   Message from Alda Berthold, DO sent at 11/22/2016 12:18 PM EDT ----- Please inform patient that her folate, vitamin B1 (thiamine), and B12 are very low and we need to start supplementation.  Start taking folic acid 1mg  and vitamin B1 100mg  daily by mouth.  Start Vitamin B12 1028mcg IM injection daily x 7 days, weekly x 4 weeks, then monthly thereafter x 1 year.  Thanks.

## 2016-12-01 NOTE — Telephone Encounter (Signed)
Patient given the results.  She will start the supplements this week and begin injections next week.

## 2016-12-05 ENCOUNTER — Ambulatory Visit (INDEPENDENT_AMBULATORY_CARE_PROVIDER_SITE_OTHER): Payer: Medicaid Other | Admitting: *Deleted

## 2016-12-05 DIAGNOSIS — E538 Deficiency of other specified B group vitamins: Secondary | ICD-10-CM

## 2016-12-05 MED ORDER — CYANOCOBALAMIN 1000 MCG/ML IJ SOLN
1000.0000 ug | Freq: Once | INTRAMUSCULAR | Status: AC
  Administered 2016-12-05: 1000 ug via INTRAMUSCULAR

## 2016-12-07 ENCOUNTER — Ambulatory Visit (INDEPENDENT_AMBULATORY_CARE_PROVIDER_SITE_OTHER): Payer: Medicaid Other | Admitting: *Deleted

## 2016-12-07 ENCOUNTER — Other Ambulatory Visit: Payer: Self-pay | Admitting: Family Medicine

## 2016-12-07 DIAGNOSIS — E538 Deficiency of other specified B group vitamins: Secondary | ICD-10-CM

## 2016-12-07 DIAGNOSIS — G6289 Other specified polyneuropathies: Secondary | ICD-10-CM

## 2016-12-07 MED ORDER — CYANOCOBALAMIN 1000 MCG/ML IJ SOLN
1000.0000 ug | Freq: Once | INTRAMUSCULAR | Status: AC
  Administered 2016-12-07: 1000 ug via INTRAMUSCULAR

## 2016-12-07 MED FILL — GABAPENTIN 600 MG TABLET: 600 | 30 days supply | Qty: 90 | Fill #0

## 2016-12-07 MED FILL — hydrALAZINE HCL 10 MG TABS: 10 | 30 days supply | Qty: 90 | Fill #4

## 2016-12-13 ENCOUNTER — Ambulatory Visit (INDEPENDENT_AMBULATORY_CARE_PROVIDER_SITE_OTHER): Payer: Medicaid Other | Admitting: *Deleted

## 2016-12-13 DIAGNOSIS — E538 Deficiency of other specified B group vitamins: Secondary | ICD-10-CM

## 2016-12-13 MED ORDER — CYANOCOBALAMIN 1000 MCG/ML IJ SOLN
1000.0000 ug | Freq: Once | INTRAMUSCULAR | Status: AC
  Administered 2016-12-13: 1000 ug via INTRAMUSCULAR

## 2016-12-24 ENCOUNTER — Ambulatory Visit (HOSPITAL_COMMUNITY)
Admission: EM | Admit: 2016-12-24 | Discharge: 2016-12-24 | Disposition: A | Discharge status: Home / Self Care | Payer: Medicaid Other

## 2016-12-24 ENCOUNTER — Encounter (HOSPITAL_COMMUNITY): Payer: Self-pay | Admitting: *Deleted

## 2016-12-24 ENCOUNTER — Ambulatory Visit (INDEPENDENT_AMBULATORY_CARE_PROVIDER_SITE_OTHER): Payer: Medicaid Other

## 2016-12-24 DIAGNOSIS — M79671 Pain in right foot: Secondary | ICD-10-CM

## 2016-12-24 DIAGNOSIS — L539 Erythematous condition, unspecified: Secondary | ICD-10-CM

## 2016-12-24 DIAGNOSIS — W19XXXA Unspecified fall, initial encounter: Secondary | ICD-10-CM

## 2016-12-24 DIAGNOSIS — R2241 Localized swelling, mass and lump, right lower limb: Secondary | ICD-10-CM

## 2016-12-24 HISTORY — DX: Polyneuropathy, unspecified: G62.9

## 2016-12-24 MED ORDER — IBUPROFEN 800 MG PO TABS: 800.0000 mg | ORAL_TABLET | Freq: Three times a day (TID) | ORAL | 0 refills | Status: DC

## 2016-12-24 NOTE — ED Provider Notes (Signed)
CSN: [id number]     Arrival date & time 12/24/16  1200 History   None    Chief Complaint  Patient presents with  . Fall  . Foot Pain   (Consider location/radiation/quality/duration/timing/severity/associated sxs/prior Treatment) 46 y.o. female presents with injury to right foot X 3 days ago. Patient states she was walking bear foot when she tripped and fell over a hose. Patient denies any LOC. Pulses intact to pedial and dorsalis pedis.  2nd phalangeal toenail is intact but black. Cap refill < 2. Redness and erythema noted to 2nd phalangeal. Swelling along distal aspect of metatarals.  No deformity noted. Patient states she in unable to move toes. Full ROM noted to ankle. patient is able to bear weight to walk but states that she must "walk on the back of me foot" Condition is acute in nature. Condition is made better by nothing. Condition is made worse by nothing. Patient denies any treatment prior to there arrival at this facility.        Past Medical History:  Diagnosis Date  . History of alcoholism ([place])   . Hypertension   . Malnutrition ([place])   . Peripheral neuropathy   . Strachan's syndrome    Past Surgical History:  Procedure Laterality Date  . MULTIPLE EXTRACTIONS WITH ALVEOLOPLASTY N/A 12/25/2013   Procedure: Extraction of tooth #'s 2,3,4,5,6,8,9,10,11,12,13,14,15,20,21,22,23,24,25,26,27, [phone] with alveoloplasty and bilateral mandibular tori reductions;  Surgeon: Lenn Cal, DDS;  Location: [facility];  Service: Oral Surgery;  Laterality: N/A;  . SURAL NERVE BX Right 12/24/2013   Procedure: SURAL NERVE BIOPSY;  Surgeon: Kristeen Miss, MD;  Location: [place] [facility];  Service: Neurosurgery;  Laterality: Right;  . TUBAL LIGATION     Family History  Problem Relation Age of Onset  . Adopted: Yes  . Diabetes Father   . Hypertension Mother   . Raynaud syndrome Mother   . Diabetes Maternal Aunt   . Heart disease Maternal Aunt   . Diabetes Maternal Uncle   . Heart disease  Maternal Uncle   . Drug abuse Maternal Grandmother   . Cancer Maternal Grandmother     intestinal  . Hypertension Maternal Grandmother   . Cancer Maternal Grandfather     lung  . Hypertension Paternal Grandfather    Social History  Substance Use Topics  . Smoking status: Current Every Day Smoker    Packs/day: 0.50    Years: 20.00  . Smokeless tobacco: Never Used  . Alcohol use No   OB History    No data available     Review of Systems  Constitutional: Negative for chills and fever.  HENT: Negative for ear pain and sore throat.   Eyes: Negative for pain and visual disturbance.  Respiratory: Negative for cough and shortness of breath.   Cardiovascular: Negative for chest pain and palpitations.  Gastrointestinal: Negative for abdominal pain and vomiting.  Genitourinary: Negative for dysuria and hematuria.  Musculoskeletal: Negative for arthralgias and back pain.       Swelling to right foot, 2nd toe injured  Skin: Negative for color change and rash.  Neurological: Negative for seizures and syncope.  All other systems reviewed and are negative.   Allergies  Patient has no known allergies.  Home Medications   Prior to Admission medications   Medication Sig Start Date End Date Taking? Authorizing Provider  amLODipine (NORVASC) 10 MG tablet TAKE 1 TABLET BY MOUTH DAILY 07/19/16  Yes Arnoldo Morale, MD  Cyanocobalamin (VITAMIN B-12 IJ) Inject as directed.  Yes Historical Provider, MD  DULoxetine (CYMBALTA) 60 MG capsule TAKE 1 CAPSULE BY MOUTH 2 TIMES DAILY. 11/01/16  Yes Arnoldo Morale, MD  gabapentin (NEURONTIN) 600 MG tablet Take 1 tablet every morning and 2 tablets in the evening. 12/07/16  Yes Arnoldo Morale, MD  hydrALAZINE (APRESOLINE) 10 MG tablet Take 1 tablet (10 mg total) by mouth 3 (three) times daily. 07/19/16  Yes Arnoldo Morale, MD  omeprazole (PRILOSEC) 20 MG capsule TAKE 1 CAPSULE BY MOUTH DAILY. 11/30/16  Yes Arnoldo Morale, MD  promethazine (PHENERGAN) 25 MG tablet Take  1 tablet (25 mg total) by mouth every 8 (eight) hours as needed for nausea or vomiting. 07/19/16  Yes Arnoldo Morale, MD  folic acid (FOLVITE) 1 MG tablet Take 1 tablet (1 mg total) by mouth daily. 12/27/13   Verlee Monte, MD  ibuprofen (ADVIL,MOTRIN) 800 MG tablet Take 1 tablet (800 mg total) by mouth 3 (three) times daily. 12/24/16   Jacqualine Mau, NP  Multiple Vitamins-Minerals (MULTIVITAMIN WITH MINERALS) tablet Take 1 tablet by mouth daily.    Historical Provider, MD  POTASSIUM CHLORIDE PO Take by mouth. Reported on 09/03/2015    Historical Provider, MD   Meds Ordered and Administered this Visit  Medications - No data to display  BP 123/80   Pulse 76   Temp 98.3 F (36.8 C) (Oral)   Resp 16   LMP 12/03/2016 (Approximate)   SpO2 100%  No data found.   Physical Exam  Constitutional: She is oriented to person, place, and time. She appears well-developed and well-nourished.  HENT:  Head: Normocephalic and atraumatic.  Eyes: Conjunctivae are normal.  Neck: Normal range of motion.  Pulmonary/Chest: Effort normal.  Musculoskeletal: She exhibits edema and tenderness. She exhibits no deformity.  Edema to distal aspect of right metatarsal with tenderness. Erythema to 2nd phalangeal. Toe nail is black. Cap refil < 2 and dorsalis pedis and pedial pulse noted +2  Neurological: She is alert and oriented to person, place, and time.  Psychiatric: She has a normal mood and affect.  Nursing note and vitals reviewed.   Urgent Care Course     Procedures (including critical care time)  Labs Review Labs Reviewed - No data to display  Imaging Review Dg Foot Complete Right  Result Date: 12/24/2016 CLINICAL DATA:  Tripped over water hose last Wednesday, fell onto right foot, right foot pain EXAM: RIGHT FOOT COMPLETE - 3+ VIEW COMPARISON:  None. FINDINGS: Three views of the right foot submitted. No acute fracture or subluxation. No radiopaque foreign body. Tiny plantar spur of calcaneus.  There is soft tissue swelling dorsal metatarsal region. IMPRESSION: No acute fracture or subluxation. Soft tissue swelling dorsal metatarsal region. Electronically Signed   By: Lahoma Crocker M.D.   On: 12/24/2016 13:02      MDM   1. Fall, initial encounter       Jacqualine Mau, NP 12/24/16 [phone]

## 2016-12-24 NOTE — ED Triage Notes (Signed)
Reports tripping over hose yesterday, causing her to fall.  C/O right foot pain, swelling, painful weight bearing.  CMS intact.

## 2016-12-24 NOTE — Discharge Instructions (Signed)
Continue to res, elevate and ice the affected foot and follow up with orthopedics for further evaluation.

## 2017-01-03 ENCOUNTER — Other Ambulatory Visit: Payer: Self-pay | Admitting: Family Medicine

## 2017-01-03 DIAGNOSIS — K219 Gastro-esophageal reflux disease without esophagitis: Secondary | ICD-10-CM

## 2017-01-03 MED FILL — DULoxetine HCL 60 MG CPEP: 60 | 30 days supply | Qty: 60 | Fill #2

## 2017-01-03 MED FILL — AMLODIPINE BESYLATE 10 MG T: 10 | 30 days supply | Qty: 30 | Fill #5

## 2017-01-03 MED FILL — OMEPRAZOLE 20 MG CAP: 20 | 30 days supply | Qty: 30 | Fill #0

## 2017-01-06 ENCOUNTER — Ambulatory Visit: Payer: Medicaid Other | Attending: Family Medicine | Admitting: Physician Assistant

## 2017-01-06 VITALS — BP 151/89 | HR 69 | Temp 98.1°F | Resp 16 | Wt 124.8 lb

## 2017-01-06 DIAGNOSIS — Z79899 Other long term (current) drug therapy: Secondary | ICD-10-CM | POA: Insufficient documentation

## 2017-01-06 DIAGNOSIS — I10 Essential (primary) hypertension: Secondary | ICD-10-CM | POA: Diagnosis not present

## 2017-01-06 DIAGNOSIS — X58XXXD Exposure to other specified factors, subsequent encounter: Secondary | ICD-10-CM | POA: Insufficient documentation

## 2017-01-06 DIAGNOSIS — S9031XD Contusion of right foot, subsequent encounter: Secondary | ICD-10-CM | POA: Diagnosis not present

## 2017-01-06 DIAGNOSIS — R2241 Localized swelling, mass and lump, right lower limb: Secondary | ICD-10-CM | POA: Insufficient documentation

## 2017-01-06 DIAGNOSIS — M25571 Pain in right ankle and joints of right foot: Secondary | ICD-10-CM | POA: Insufficient documentation

## 2017-01-06 NOTE — Patient Instructions (Signed)
Check blood pressure 3-4 times/week and record and bring to next visit

## 2017-01-06 NOTE — Progress Notes (Signed)
[name], is a 46 y.o. female  PFX:[id number]  HGD:[id number]  DOB - 1971/02/06  Subjective:  Chief Complaint and HPI: [name] is a 46 y.o. female here today to establish care and for a follow up visit after being seen in the ED 12/24/2016 after tripping over a garden hose. The event occurred about 1 week prior to that.  She continues to have swelling in her R second toe and mild pain.  ED/Hospital notes reviewed.  Xrays showed no fracture.  She hasn't taken her BP meds today  ROS:   Constitutional:  No f/c, No night sweats, No unexplained weight loss. EENT:  No vision changes, No blurry vision, No hearing changes. No mouth, throat, or ear problems.  Respiratory: No cough, No SOB Cardiac: No CP, no palpitations GI:  No abd pain, No N/V/D. GU: No Urinary s/sx Musculoskeletal: No joint pain Neuro: No headache, no dizziness, no motor weakness.  Skin: No rash Endocrine:  No polydipsia. No polyuria.  Psych: Denies SI/HI  No problems updated.  ALLERGIES: No Known Allergies  PAST MEDICAL HISTORY: Past Medical History:  Diagnosis Date  . History of alcoholism ([place])   . Hypertension   . Malnutrition ([place])   . Peripheral neuropathy   . Strachan's syndrome     MEDICATIONS AT HOME: Prior to Admission medications   Medication Sig Start Date End Date Taking? Authorizing Provider  amLODipine (NORVASC) 10 MG tablet TAKE 1 TABLET BY MOUTH DAILY 07/19/16   Arnoldo Morale, MD  Cyanocobalamin (VITAMIN B-12 IJ) Inject as directed.    Historical Provider, MD  DULoxetine (CYMBALTA) 60 MG capsule TAKE 1 CAPSULE BY MOUTH 2 TIMES DAILY. 11/01/16   Arnoldo Morale, MD  folic acid (FOLVITE) 1 MG tablet Take 1 tablet (1 mg total) by mouth daily. 12/27/13   Verlee Monte, MD  gabapentin (NEURONTIN) 600 MG tablet Take 1 tablet every morning and 2 tablets in the evening. 12/07/16   Arnoldo Morale, MD  hydrALAZINE (APRESOLINE) 10 MG tablet Take 1 tablet (10 mg total) by mouth 3 (three) times daily.  07/19/16   Arnoldo Morale, MD  ibuprofen (ADVIL,MOTRIN) 800 MG tablet Take 1 tablet (800 mg total) by mouth 3 (three) times daily. 12/24/16   Jacqualine Mau, NP  Multiple Vitamins-Minerals (MULTIVITAMIN WITH MINERALS) tablet Take 1 tablet by mouth daily.    Historical Provider, MD  omeprazole (PRILOSEC) 20 MG capsule TAKE 1 CAPSULE BY MOUTH DAILY. 01/03/17   Arnoldo Morale, MD  POTASSIUM CHLORIDE PO Take by mouth. Reported on 09/03/2015    Historical Provider, MD  promethazine (PHENERGAN) 25 MG tablet Take 1 tablet (25 mg total) by mouth every 8 (eight) hours as needed for nausea or vomiting. 07/19/16   Arnoldo Morale, MD     Objective:  EXAM:   Vitals:   01/06/17 0955  BP: (!) 151/89  Pulse: 69  Resp: 16  Temp: 98.1 F (36.7 C)  TempSrc: Oral  SpO2: 96%  Weight: 124 lb 12.8 oz (56.6 kg)    General appearance : A&OX3. NAD. Non-toxic-appearing HEENT: Atraumatic and Normocephalic.  PERRLA. EOM intact Neck: supple, no JVD. No cervical lymphadenopathy. No thyromegaly Chest/Lungs:  Breathing-non-labored, Good air entry bilaterally, breath sounds normal without rales, rhonchi, or wheezing  CVS: S1 S2 regular, no murmurs, gallops, rubs Extremities: Bilateral Lower Ext shows no edema, both legs are warm to touch with = pulse throughout.  R foot and ankle WNL w/o swelling.  Her R second toe remains swollen, and is slightly bruised  with otherwise coloration returning to normal, and is mild TTP with manipulation.  No specific point TTP.  N-V intact.  Capillary RF slightly delayed Neurology:  CN II-XII grossly intact, Non focal.   Psych:  TP linear. J/I WNL. Normal speech. Appropriate eye contact and affect.  Skin:  No Rash  Data Review Lab Results  Component Value Date   HGBA1C 4.8 12/20/2013     Assessment & Plan   1. Contusion of right foot, subsequent encounter She is in a long cam walker.  She can transition to a post op shoe if desired and an Rx was written.  She should practice toe  massage, home PT, ice and elevate as needed until swelling is resolved.  RTW note given/wear support shoe/boot as needed.  2. Hypertension, unspecified type Uncontrolled but hasn't taken meds today.  Check BP OOO and record 3-4 times/week.  Bring to next visit.  Continue current regimen.   Patient have been counseled extensively about nutrition and exercise  Return in about 3 weeks (around 01/27/2017) for recheck foot and htn.  The patient was given clear instructions to go to ER or return to medical center if symptoms don't improve, worsen or new problems develop. The patient verbalized understanding. The patient was told to call to get lab results if they haven't heard anything in the next week.     Freeman Caldron, PA-C [facility] and [place], [place]   01/06/2017, 10:22 AMPatient ID: [name], female   DOB: 21-Nov-1970, 46 y.o.   MRN: [id number]

## 2017-01-11 MED FILL — hydrALAZINE HCL 10 MG TABS: 10 | 30 days supply | Qty: 90 | Fill #5

## 2017-01-11 MED FILL — GABAPENTIN 600 MG TABLET: 600 | 30 days supply | Qty: 90 | Fill #1

## 2017-01-31 ENCOUNTER — Other Ambulatory Visit: Payer: Self-pay | Admitting: Family Medicine

## 2017-01-31 DIAGNOSIS — I10 Essential (primary) hypertension: Secondary | ICD-10-CM

## 2017-01-31 DIAGNOSIS — K219 Gastro-esophageal reflux disease without esophagitis: Secondary | ICD-10-CM

## 2017-01-31 DIAGNOSIS — G6289 Other specified polyneuropathies: Secondary | ICD-10-CM

## 2017-01-31 MED FILL — OMEPRAZOLE DR 20 MG CAPSULE: 20 | 30 days supply | Qty: 30 | Fill #0

## 2017-01-31 MED FILL — AMLODIPINE BESYLATE 10 MG T: 10 | 30 days supply | Qty: 30 | Fill #0

## 2017-01-31 MED FILL — DULoxetine HCL 60 MG CPEP: 60 | 30 days supply | Qty: 60 | Fill #0

## 2017-02-03 ENCOUNTER — Telehealth: Payer: Self-pay | Admitting: Neurology

## 2017-02-03 ENCOUNTER — Ambulatory Visit (INDEPENDENT_AMBULATORY_CARE_PROVIDER_SITE_OTHER): Payer: Medicaid Other

## 2017-02-03 DIAGNOSIS — G629 Polyneuropathy, unspecified: Secondary | ICD-10-CM | POA: Diagnosis not present

## 2017-02-03 MED ORDER — CYANOCOBALAMIN 1000 MCG/ML IJ SOLN
1000.0000 ug | Freq: Once | INTRAMUSCULAR | Status: AC
  Administered 2017-02-03: 1000 ug via INTRAMUSCULAR

## 2017-02-07 NOTE — Telephone Encounter (Signed)
error 

## 2017-02-08 ENCOUNTER — Other Ambulatory Visit: Payer: Self-pay | Admitting: Family Medicine

## 2017-02-08 DIAGNOSIS — G6289 Other specified polyneuropathies: Secondary | ICD-10-CM

## 2017-02-08 DIAGNOSIS — I10 Essential (primary) hypertension: Secondary | ICD-10-CM

## 2017-02-08 MED FILL — GABAPENTIN 600 MG TABLET: 600 | 30 days supply | Qty: 90 | Fill #2

## 2017-02-08 MED FILL — hydrALAZINE HCL 10 MG TABS: 10 | 30 days supply | Qty: 90 | Fill #0

## 2017-02-16 ENCOUNTER — Ambulatory Visit (INDEPENDENT_AMBULATORY_CARE_PROVIDER_SITE_OTHER): Payer: Medicaid Other | Admitting: *Deleted

## 2017-02-16 DIAGNOSIS — E538 Deficiency of other specified B group vitamins: Secondary | ICD-10-CM

## 2017-02-16 MED ORDER — CYANOCOBALAMIN 1000 MCG/ML IJ SOLN
1000.0000 ug | Freq: Once | INTRAMUSCULAR | Status: AC
  Administered 2017-02-16: 1000 ug via INTRAMUSCULAR

## 2017-02-28 ENCOUNTER — Emergency Department (HOSPITAL_COMMUNITY)
Admission: EM | Admit: 2017-02-28 | Discharge: 2017-03-01 | Disposition: A | Discharge status: Home / Self Care | Payer: Medicaid Other | Attending: Emergency Medicine | Admitting: Emergency Medicine

## 2017-02-28 ENCOUNTER — Ambulatory Visit: Payer: Medicaid Other | Attending: Family Medicine | Admitting: Physician Assistant

## 2017-02-28 ENCOUNTER — Encounter (HOSPITAL_COMMUNITY): Payer: Self-pay | Admitting: Emergency Medicine

## 2017-02-28 VITALS — BP 123/81 | HR 80 | Temp 99.4°F | Resp 16 | Wt 121.0 lb

## 2017-02-28 DIAGNOSIS — T675XXA Heat exhaustion, unspecified, initial encounter: Secondary | ICD-10-CM | POA: Insufficient documentation

## 2017-02-28 DIAGNOSIS — F17200 Nicotine dependence, unspecified, uncomplicated: Secondary | ICD-10-CM | POA: Diagnosis not present

## 2017-02-28 DIAGNOSIS — Z79899 Other long term (current) drug therapy: Secondary | ICD-10-CM | POA: Insufficient documentation

## 2017-02-28 DIAGNOSIS — N921 Excessive and frequent menstruation with irregular cycle: Secondary | ICD-10-CM | POA: Diagnosis not present

## 2017-02-28 DIAGNOSIS — N390 Urinary tract infection, site not specified: Secondary | ICD-10-CM

## 2017-02-28 DIAGNOSIS — N3001 Acute cystitis with hematuria: Secondary | ICD-10-CM | POA: Diagnosis not present

## 2017-02-28 DIAGNOSIS — N920 Excessive and frequent menstruation with regular cycle: Secondary | ICD-10-CM | POA: Insufficient documentation

## 2017-02-28 DIAGNOSIS — G629 Polyneuropathy, unspecified: Secondary | ICD-10-CM | POA: Diagnosis not present

## 2017-02-28 DIAGNOSIS — I10 Essential (primary) hypertension: Secondary | ICD-10-CM | POA: Insufficient documentation

## 2017-02-28 DIAGNOSIS — M7989 Other specified soft tissue disorders: Secondary | ICD-10-CM | POA: Diagnosis not present

## 2017-02-28 DIAGNOSIS — R55 Syncope and collapse: Secondary | ICD-10-CM | POA: Insufficient documentation

## 2017-02-28 LAB — COMPREHENSIVE METABOLIC PANEL
ALT: 9 U/L — ABNORMAL LOW (ref 14–54)
AST: 23 U/L (ref 15–41)
Albumin: 3.5 g/dL (ref 3.5–5.0)
Alkaline Phosphatase: 140 U/L — ABNORMAL HIGH (ref 38–126)
Anion gap: 10 (ref 5–15)
BUN: 7 mg/dL (ref 6–20)
CO2: 27 mmol/L (ref 22–32)
Calcium: 9.2 mg/dL (ref 8.9–10.3)
Chloride: 97 mmol/L — ABNORMAL LOW (ref 101–111)
Creatinine, Ser: 0.56 mg/dL (ref 0.44–1.00)
GFR calc Af Amer: >60 mL/min (ref >60)
GFR calc non Af Amer: >60 mL/min (ref >60)
Glucose, Bld: 93 mg/dL (ref 65–99)
Potassium: 3.2 mmol/L — ABNORMAL LOW (ref 3.5–5.1)
Sodium: 134 mmol/L — ABNORMAL LOW (ref 135–145)
Total Bilirubin: 0.7 mg/dL (ref 0.3–1.2)
Total Protein: 7.4 g/dL (ref 6.5–8.1)

## 2017-02-28 LAB — CBC
HCT: 37.6 % (ref 36.0–46.0)
Hemoglobin: 13.3 g/dL (ref 12.0–15.0)
MCH: 35.1 pg — ABNORMAL HIGH (ref 26.0–34.0)
MCHC: 35.4 g/dL (ref 30.0–36.0)
MCV: 99.2 fL (ref 78.0–100.0)
Platelets: 372 10*3/uL (ref 150–400)
RBC: 3.79 MIL/uL — ABNORMAL LOW (ref 3.87–5.11)
RDW: 15.3 % (ref 11.5–15.5)
WBC: 18.3 10*3/uL — ABNORMAL HIGH (ref 4.0–10.5)

## 2017-02-28 LAB — POCT URINALYSIS DIPSTICK
Blood, UA: NEGATIVE
Glucose, UA: NEGATIVE
Nitrite, UA: POSITIVE
Protein, UA: 100
Spec Grav, UA: 1.01 (ref 1.010–1.025)
Urobilinogen, UA: 0.2 E.U./dL
pH, UA: 6 (ref 5.0–8.0)

## 2017-02-28 LAB — URINALYSIS, ROUTINE W REFLEX MICROSCOPIC
Bilirubin Urine: NEGATIVE
Glucose, UA: NEGATIVE mg/dL
Ketones, ur: 5 mg/dL — AB
Nitrite: NEGATIVE
Protein, ur: NEGATIVE mg/dL
Specific Gravity, Urine: 1.003 — ABNORMAL LOW (ref 1.005–1.030)
pH: 6 (ref 5.0–8.0)

## 2017-02-28 LAB — POCT URINE PREGNANCY: Preg Test, Ur: NEGATIVE

## 2017-02-28 MED ORDER — PHENAZOPYRIDINE HCL 100 MG PO TABS: 100.0000 mg | ORAL_TABLET | Freq: Three times a day (TID) | ORAL | 0 refills | Status: DC | PRN

## 2017-02-28 MED ORDER — POTASSIUM CHLORIDE CRYS ER 20 MEQ PO TBCR
20.0000 meq | EXTENDED_RELEASE_TABLET | Freq: Once | ORAL | Status: AC
  Administered 2017-03-01: 20 meq via ORAL
  Filled 2017-02-28: qty 1

## 2017-02-28 MED ORDER — NITROFURANTOIN MONOHYD MACRO 100 MG PO CAPS: 100.0000 mg | ORAL_CAPSULE | Freq: Two times a day (BID) | ORAL | 0 refills | Status: DC

## 2017-02-28 MED FILL — NITROFURANTOIN MONO-MCR 100: 100 | 5 days supply | Qty: 10 | Fill #0

## 2017-02-28 NOTE — Progress Notes (Signed)
Bonnye Halle, is a 46 y.o. female  KCL:[id number]  CBS:[id number]  DOB - 09-Oct-1970  Subjective:  Chief Complaint and HPI: [name] is a 46 y.o. female here today for dysuria for about 5 days.  No f/c. No N/V/D.  No abdominal pain.  No back pain.  No pelvic pain/vaginal discharge.  Denies STI risk factors.  Monogamous with partner.  Has had BTL.  She has never had a UTI.  She has also been having a period since May 9th, 2018.  Using ~3-4 tampons per day.  Mom went thru menopause in her 7s.  Usu periods are normal and monthly.  Still has swelling of R second toe since tripping over a garden hose in April 2018.  Wants to see a specialist.  At the time, xrays were negative.  ROS:   Constitutional:  No f/c, No night sweats, No unexplained weight loss. EENT:  No vision changes, No blurry vision, No hearing changes. No mouth, throat, or ear problems.  Respiratory: No cough, No SOB Cardiac: No CP, no palpitations GI:  No abd pain, No N/V/D. GU: + Urinary s/sx and heavy/long period Musculoskeletal: No joint pain Neuro: No headache, no dizziness, no motor weakness.  Skin: No rash Endocrine:  No polydipsia. No polyuria.  Psych: Denies SI/HI  No problems updated.  ALLERGIES: No Known Allergies  PAST MEDICAL HISTORY: Past Medical History:  Diagnosis Date  . History of alcoholism ([place])   . Hypertension   . Malnutrition ([place])   . Peripheral neuropathy   . Strachan's syndrome     MEDICATIONS AT HOME: Prior to Admission medications   Medication Sig Start Date End Date Taking? Authorizing Provider  amLODipine (NORVASC) 10 MG tablet TAKE 1 TABLET BY MOUTH DAILY 01/31/17  Yes Arnoldo Morale, MD  Cyanocobalamin (VITAMIN B-12 IJ) Inject as directed.   Yes [provider]  DULoxetine (CYMBALTA) 60 MG capsule TAKE 1 CAPSULE BY MOUTH 2 TIMES DAILY. 01/31/17  Yes Arnoldo Morale, MD  gabapentin (NEURONTIN) 600 MG tablet TAKE 1 TABLET EVERY MORNING AND 2 TABLETS IN THE EVENING. 02/08/17   Yes Amao, Enobong, MD  hydrALAZINE (APRESOLINE) 10 MG tablet TAKE 1 TABLET BY MOUTH 3 TIMES DAILY. 02/08/17  Yes Arnoldo Morale, MD  ibuprofen (ADVIL,MOTRIN) 800 MG tablet Take 1 tablet (800 mg total) by mouth 3 (three) times daily. 12/24/16  Yes Jacqualine Mau, NP  Multiple Vitamins-Minerals (MULTIVITAMIN WITH MINERALS) tablet Take 1 tablet by mouth daily.   Yes [provider]  omeprazole (PRILOSEC) 20 MG capsule TAKE 1 CAPSULE BY MOUTH DAILY. 01/31/17  Yes Arnoldo Morale, MD  POTASSIUM CHLORIDE PO Take by mouth. Reported on 09/03/2015   Yes [provider]  promethazine (PHENERGAN) 25 MG tablet Take 1 tablet (25 mg total) by mouth every 8 (eight) hours as needed for nausea or vomiting. 07/19/16  Yes Arnoldo Morale, MD  folic acid (FOLVITE) 1 MG tablet Take 1 tablet (1 mg total) by mouth daily. Patient not taking: Reported on 02/28/2017 12/27/13   Verlee Monte, MD  nitrofurantoin, macrocrystal-monohydrate, (MACROBID) 100 MG capsule Take 1 capsule (100 mg total) by mouth 2 (two) times daily. 02/28/17   Argentina Donovan, PA-C  phenazopyridine (PYRIDIUM) 100 MG tablet Take 1 tablet (100 mg total) by mouth 3 (three) times daily as needed for pain. With urination 02/28/17   Argentina Donovan, PA-C     Objective:  EXAM:   Vitals:   02/28/17 1532  BP: 123/81  Pulse: 80  Resp: 16  Temp: 99.4 F (37.4 C)  SpO2: 99%  Weight: 121 lb (54.9 kg)    General appearance : A&OX3. NAD. Non-toxic-appearing HEENT: Atraumatic and Normocephalic.  PERRLA. EOM intact.   Neck: supple, no JVD. No cervical lymphadenopathy. No thyromegaly Chest/Lungs:  Breathing-non-labored, Good air entry bilaterally, breath sounds normal without rales, rhonchi, or wheezing  CVS: S1 S2 regular, no murmurs, gallops, rubs  Abdomen: Bowel sounds present, Non tender and not distended with no gaurding, rigidity or rebound. No CVA TTP.  Pelvic deferred due to current  Extremities: Bilateral Lower Ext shows no  edema, both legs are warm to touch with = pulse throughout.  R second toe still with swelling and discoloration.  N-V intact and capillary RF is WNL.  There is a dark area underneath the nail from the trauma she sustained previously.  Neurology:  CN II-XII grossly intact, Non focal.   Psych:  TP linear. J/I WNL. Normal speech. Appropriate eye contact and affect.  Skin:  No Rash  Data Review Lab Results  Component Value Date   HGBA1C 4.8 12/20/2013     Assessment & Plan   1. Acute cystitis with hematuria Increase water intake - POCT urinalysis dipstick - Urine Culture - Urine cytology ancillary only - nitrofurantoin, macrocrystal-monohydrate, (MACROBID) 100 MG capsule; Take 1 capsule (100 mg total) by mouth 2 (two) times daily.  Dispense: 10 capsule; Refill: 0 - phenazopyridine (PYRIDIUM) 100 MG tablet; Take 1 tablet (100 mg total) by mouth 3 (three) times daily as needed for pain. With urination  Dispense: 10 tablet; Refill: 0  2. Menorrhagia with irregular cycle - CBC with Differential/Platelet - TSH  3. Toe swelling-2nd toe R foot Since injury in April! - Ambulatory referral to Podiatry  Patient have been counseled extensively about nutrition and exercise  Return in about 2 months (around 04/30/2017) for assign PCP and CPE with fasting blood work.  The patient was given clear instructions to go to ER or return to medical center if symptoms don't improve, worsen or new problems develop. The patient verbalized understanding. The patient was told to call to get lab results if they haven't heard anything in the next week.     Freeman Caldron, PA-C [facility] and [place], [place]   02/28/2017, 4:03 PMPatient ID: [name], female   DOB: May 07, 1971, 46 y.o.   MRN: [id number]

## 2017-02-28 NOTE — ED Notes (Signed)
Lab called stating they need a recollect of the light green tube

## 2017-02-28 NOTE — ED Triage Notes (Signed)
Patient was at work at [employer]. Patient got hot. Patient had a syncope episode x 2. EMS took to an first aid room to help her. Patient had second syncope when ems stood her up and patient lost consciousness.

## 2017-03-01 LAB — CBC WITH DIFFERENTIAL/PLATELET
Basophils Absolute: 0 10*3/uL (ref 0.0–0.2)
Basos: 0 %
EOS (ABSOLUTE): 0.2 10*3/uL (ref 0.0–0.4)
Eos: 1 %
Hematocrit: 36.6 % (ref 34.0–46.6)
Hemoglobin: 11.5 g/dL (ref 11.1–15.9)
Immature Grans (Abs): 0 10*3/uL (ref 0.0–0.1)
Immature Granulocytes: 0 %
Lymphocytes Absolute: 2.1 10*3/uL (ref 0.7–3.1)
Lymphs: 12 %
MCH: 33.4 pg — ABNORMAL HIGH (ref 26.6–33.0)
MCHC: 31.4 g/dL — ABNORMAL LOW (ref 31.5–35.7)
MCV: 106 fL — ABNORMAL HIGH (ref 79–97)
Monocytes Absolute: 2.1 10*3/uL — ABNORMAL HIGH (ref 0.1–0.9)
Monocytes: 12 %
Neutrophils Absolute: 13.4 10*3/uL — ABNORMAL HIGH (ref 1.4–7.0)
Neutrophils: 75 %
Platelets: 352 10*3/uL (ref 150–379)
RBC: 3.44 x10E6/uL — ABNORMAL LOW (ref 3.77–5.28)
RDW: 15.9 % — ABNORMAL HIGH (ref 12.3–15.4)
WBC: 17.8 10*3/uL — ABNORMAL HIGH (ref 3.4–10.8)

## 2017-03-01 LAB — TSH: TSH: 0.584 u[IU]/mL (ref 0.450–4.500)

## 2017-03-01 MED ORDER — SODIUM CHLORIDE 0.9 % IV BOLUS (SEPSIS)
500.0000 mL | Freq: Once | INTRAVENOUS | Status: AC
  Administered 2017-03-01: 500 mL via INTRAVENOUS

## 2017-03-01 NOTE — Discharge Instructions (Signed)
It was my pleasure taking care of you today!   Increase hydration. Rest.   Return to ER for new or worsening symptoms, any additional concerns.

## 2017-03-01 NOTE — ED Provider Notes (Signed)
[facility] DEPT Provider Note   CSN: [id number] Arrival date & time: 02/28/17  2027     History   Chief Complaint Chief Complaint  Patient presents with  . Loss of Consciousness    HPI [name] is a 46 y.o. female.  The history is provided by the patient and medical records. No language interpreter was used.  Loss of Consciousness   Associated symptoms include light-headedness and weakness. Pertinent negatives include dizziness.   [name] is a 46 y.o. 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 Patient states that she was cooking and felt really hot. She told a coworker that she did not feel good and needed to sit down and then apparently had syncopal episode. She states coworker caught her and that she did not fall down or hit head. Coworker sat her down and said she came to in about 30 seconds. EMS came and helped her to the first aid room. When getting her up and walking to the first aid room with EMS, she said had episode of syncope with return in about 30 seconds. She was placed in air-conditioning and ice placed on neck / shoulders. She felt better after about 5 minutes of sitting in these conditions. She states EMS took her BP and it was "low". She was given fluids prior to arrival and states that she now feels better. No longer feels weak, dizzy, lightheaded or with any other complaints.   Past Medical History:  Diagnosis Date  . History of alcoholism ([place])   . Hypertension   . Malnutrition ([place])   . Peripheral neuropathy   . Strachan's syndrome     Patient Active Problem List   Diagnosis Date Noted  . 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 Peripheral neuropathy 12/24/2013  . Lower extremity pain, bilateral 12/18/2013    Past Surgical History:  Procedure Laterality Date  . MULTIPLE EXTRACTIONS WITH  ALVEOLOPLASTY N/A 12/25/2013   Procedure: Extraction of tooth #'s 2,3,4,5,6,8,9,10,11,12,13,14,15,20,21,22,23,24,25,26,27, [phone] with alveoloplasty and bilateral mandibular tori reductions;  Surgeon: Lenn Cal, DDS;  Location: [facility];  Service: Oral Surgery;  Laterality: N/A;  . SURAL NERVE BX Right 12/24/2013   Procedure: SURAL NERVE BIOPSY;  Surgeon: Kristeen Miss, MD;  Location: [facility];  Service: Neurosurgery;  Laterality: Right;  . TUBAL LIGATION      OB History    No data available       Home Medications    Prior to Admission medications   Medication Sig Start Date End Date Taking? Authorizing Provider  amLODipine (NORVASC) 10 MG tablet TAKE 1 TABLET BY MOUTH DAILY 01/31/17  Yes Arnoldo Morale, MD  Cyanocobalamin (VITAMIN B-12 IJ) Inject as directed once a week.    Yes [provider]  DULoxetine (CYMBALTA) 60 MG capsule TAKE 1 CAPSULE BY MOUTH 2 TIMES DAILY. 01/31/17  Yes Arnoldo Morale, MD  gabapentin (NEURONTIN) 600 MG tablet TAKE 1 TABLET EVERY MORNING AND 2 TABLETS IN THE EVENING. 02/08/17  Yes Amao, Enobong, MD  hydrALAZINE (APRESOLINE) 10 MG tablet TAKE 1 TABLET BY MOUTH 3 TIMES DAILY. 02/08/17  Yes Arnoldo Morale, MD  Multiple Vitamins-Minerals (MULTIVITAMIN WITH MINERALS) tablet Take 1 tablet by mouth daily.   Yes [provider]  nitrofurantoin, macrocrystal-monohydrate, (MACROBID) 100 MG capsule Take 1 capsule (100 mg total) by mouth 2 (two) times daily. 02/28/17  Yes Freeman Caldron  M, PA-C  omeprazole (PRILOSEC) 20 MG capsule TAKE 1 CAPSULE BY MOUTH DAILY. 01/31/17  Yes Arnoldo Morale, MD  potassium chloride (K-DUR,KLOR-CON) 10 MEQ tablet Take 10 mEq by mouth daily.   Yes [provider]  folic acid (FOLVITE) 1 MG tablet Take 1 tablet (1 mg total) by mouth daily. Patient not taking: Reported on 02/28/2017 12/27/13   Verlee Monte, MD  phenazopyridine (PYRIDIUM) 100 MG tablet Take 1 tablet (100 mg total) by mouth 3 (three) times daily as  needed for pain. With urination 02/28/17   Argentina Donovan, PA-C  promethazine (PHENERGAN) 25 MG tablet Take 1 tablet (25 mg total) by mouth every 8 (eight) hours as needed for nausea or vomiting. 07/19/16   Arnoldo Morale, MD    Family History Family History  Problem Relation Age of Onset  . Adopted: Yes  . Diabetes Father   . Hypertension Mother   . Raynaud syndrome Mother   . Diabetes Maternal Aunt   . Heart disease Maternal Aunt   . Diabetes Maternal Uncle   . Heart disease Maternal Uncle   . Drug abuse Maternal Grandmother   . Cancer Maternal Grandmother        intestinal  . Hypertension Maternal Grandmother   . Cancer Maternal Grandfather        lung  . Hypertension Paternal Grandfather     Social History Social History  Substance Use Topics  . Smoking status: Current Every Day Smoker    Packs/day: 0.50    Years: 10.00  . Smokeless tobacco: Never Used  . Alcohol use No     Allergies   Patient has no known allergies.   Review of Systems Review of Systems  Cardiovascular: Positive for syncope.  Neurological: Positive for syncope, weakness and light-headedness. Negative for dizziness.  All other systems reviewed and are negative.    Physical Exam Updated Vital Signs BP 137/86 (BP Location: Right Arm)   Pulse 85   Temp 98.3 F (36.8 C) (Oral)   Resp 15   Ht 5\' 5"  (1.651 m)   Wt 54.4 kg (120 lb)   LMP 01/18/2017   SpO2 99%   BMI 19.97 kg/m   Physical Exam  Constitutional: She is oriented to person, place, and time. She appears well-developed and well-nourished.  NAD  HENT:  No evidence of tongue biting  Neck: Normal range of motion. Neck supple. No JVD present.  No carotid bruits  Cardiovascular: Normal rate, regular rhythm and normal heart sounds.  Exam reveals no gallop and no friction rub.   No murmur heard. Intact and equal pulses in all four extremities  Pulmonary/Chest: Effort normal and breath sounds normal. No respiratory distress. She  has no wheezes. She has no rales.  Abdominal: Soft. She exhibits no distension. There is no tenderness.  Musculoskeletal: She exhibits no edema.  Neurological: She is alert and oriented to person, place, and time.  Speech clear and goal oriented. CN 2-12 grossly intact. Normal finger-to-nose and rapid alternating movements. No drift. Strength and sensation intact. Steady gait.  Skin: No pallor.  Psychiatric: She has a normal mood and affect. Her behavior is normal. Judgment and thought content normal.  Nursing note and vitals reviewed.    ED Treatments / Results  Labs (all labs ordered are listed, but only abnormal results are displayed) Labs Reviewed  CBC - Abnormal; Notable for the following:       Result Value   WBC 18.3 (*)    RBC 3.79 (*)  MCH 35.1 (*)    All other components within normal limits  URINALYSIS, ROUTINE W REFLEX MICROSCOPIC - Abnormal; Notable for the following:    Specific Gravity, Urine 1.003 (*)    Hgb urine dipstick SMALL (*)    Ketones, ur 5 (*)    Leukocytes, UA LARGE (*)    Bacteria, UA FEW (*)    Squamous Epithelial / LPF 0-5 (*)    All other components within normal limits  COMPREHENSIVE METABOLIC PANEL - Abnormal; Notable for the following:    Sodium 134 (*)    Potassium 3.2 (*)    Chloride 97 (*)    ALT 9 (*)    Alkaline Phosphatase 140 (*)    All other components within normal limits    EKG  EKG Interpretation  Date/Time:  Tuesday February 28 2017 21:34:47 EDT Ventricular Rate:  84 PR Interval:    QRS Duration: 83 QT Interval:  404 QTC Calculation: 478 R Axis:   86 Text Interpretation:  Sinus rhythm Consider left ventricular hypertrophy When compared with ECG of 11/19/2013, ST-t abnormality has improved Confirmed by Delora Fuel ([id number]) on 02/28/2017 11:48:05 PM       Radiology No results found.  Procedures Procedures (including critical care time)  Medications Ordered in ED Medications  potassium chloride SA (K-DUR,KLOR-CON) CR  tablet 20 mEq (20 mEq Oral Given 03/01/17 0044)  sodium chloride 0.9 % bolus 500 mL (500 mLs Intravenous New Bag/Given 03/01/17 0044)     Initial Impression / Assessment and Plan / ED Course  I have reviewed the triage vital signs and the nursing notes.  Pertinent labs & imaging results that were available during my care of the patient were reviewed by me and considered in my medical decision making (see chart for details).    ANALYSSA DOWNS is a 46 y.o. 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 Felt better after she was placed in air-conditioned room and ice packs to shoulder. Given fluids in ED and now with no complaints. On exam, patient is afebrile, hemodynamically stable with no focal neuro deficits and benign cardiopulmonary exam. EKG and labs reassuring. Mild hypokalemia with was replenished. Of note, patient saw her PCP this morning and was diagnosed with UTI. Urine was sent for cx and she was started on macrobid. UA here also c/w UTI however she is afebrile, no abdominal or CVA tenderness. She has only taken one dose of ABX so no ABX failure. Will have her continue macrobid and follow up with PCP. Patient with  no personal or family history of sudden, young cardiac death.  No hx of CHF.  No complaints of chest pain or shortness of breath. Low risk per [facility] Syncope Rule.   Evaluation does not show pathology that would require ongoing emergent intervention or inpatient treatment. Will discharge to home with PCP follow up. Reasons to return to ER were discussed.   Pt has remained hemodynamically stable throughout their time in the ED.   BP 137/86 (BP Location: Right Arm)   Pulse 85   Temp 98.3 F (36.8 C) (Oral)   Resp 15   Ht 5\' 5"  (1.651 m)   Wt 54.4 kg (120 lb)   LMP 01/18/2017   SpO2 99%   BMI 19.97 kg/m   Patient understands return precautions and follow up care. All questions answered.    Final Clinical  Impressions(s) / ED Diagnoses   Final  diagnoses:  Heat exhaustion, initial encounter  Syncope and collapse  Lower urinary tract infectious disease    New Prescriptions New Prescriptions   No medications on file     Ward, Ozella Almond, PA-C 34/91/79 1505    Delora Fuel, MD 69/79/48 [phone]

## 2017-03-02 ENCOUNTER — Telehealth: Payer: Self-pay | Admitting: Family Medicine

## 2017-03-02 LAB — URINE CULTURE

## 2017-03-02 MED ORDER — SULFAMETHOXAZOLE-TRIMETHOPRIM 800-160 MG PO TABS: 1.0000 | ORAL_TABLET | Freq: Two times a day (BID) | ORAL | 0 refills | Status: DC

## 2017-03-02 MED FILL — SULFAMETHOXAZOLE/TMP DS TAB: 800-160 | 7 days supply | Qty: 14 | Fill #0

## 2017-03-02 NOTE — Telephone Encounter (Signed)
She should stop the Macrobid. I have sent a new prescription for Bactrim to the pharmacy on site.

## 2017-03-02 NOTE — Telephone Encounter (Signed)
Pt calling to states that the antibiotic that she was prescribed on 02/27/17 is making her mouth and face feel numb. Wants to know if she should discontinue taking med and if so, can she get something else prescribed to her. Please f/u with pt. Thank you.

## 2017-03-03 ENCOUNTER — Telehealth: Payer: Self-pay | Admitting: Family Medicine

## 2017-03-03 NOTE — Telephone Encounter (Signed)
Pt called to ask why she was prescribed macrobid when the medication states that s pt with a vitamin B deficiency should not take this medication. States that she gets vitamin B12 injections and this should have been noted when prescribing medications. Requests a call back from PCP to discuss. Thank you.

## 2017-03-03 NOTE — Telephone Encounter (Signed)
Unfortunately I am unable to answer for the prescriber however I sent a substitute antibiotic based on her complaints.

## 2017-03-07 ENCOUNTER — Other Ambulatory Visit: Payer: Self-pay | Admitting: Family Medicine

## 2017-03-07 ENCOUNTER — Telehealth: Payer: Self-pay | Admitting: Podiatry

## 2017-03-07 DIAGNOSIS — G6289 Other specified polyneuropathies: Secondary | ICD-10-CM

## 2017-03-07 DIAGNOSIS — I10 Essential (primary) hypertension: Secondary | ICD-10-CM

## 2017-03-07 DIAGNOSIS — K219 Gastro-esophageal reflux disease without esophagitis: Secondary | ICD-10-CM

## 2017-03-07 MED FILL — GABAPENTIN 600 MG TABLET: 600 | 30 days supply | Qty: 90 | Fill #0

## 2017-03-07 NOTE — Telephone Encounter (Signed)
Left pt. Message to call to schedule appt with Rick(received referral). Problems with toe

## 2017-03-09 MED FILL — OMEPRAZOLE DR 20 MG CAPSULE: 20 | 30 days supply | Qty: 30 | Fill #0

## 2017-03-09 MED FILL — DULoxetine HCL 60 MG CPEP: 60 | 30 days supply | Qty: 60 | Fill #0

## 2017-03-09 MED FILL — hydrALAZINE HCL 10 MG TABS: 10 | 30 days supply | Qty: 90 | Fill #0

## 2017-03-09 MED FILL — AMLODIPINE BESYLATE 10 MG T: 10 | 30 days supply | Qty: 30 | Fill #0

## 2017-03-10 ENCOUNTER — Encounter: Payer: Self-pay | Admitting: *Deleted

## 2017-03-10 ENCOUNTER — Encounter: Payer: Self-pay | Admitting: Neurology

## 2017-03-10 ENCOUNTER — Ambulatory Visit (INDEPENDENT_AMBULATORY_CARE_PROVIDER_SITE_OTHER): Payer: Medicaid Other | Admitting: *Deleted

## 2017-03-10 DIAGNOSIS — E538 Deficiency of other specified B group vitamins: Secondary | ICD-10-CM | POA: Diagnosis not present

## 2017-03-10 MED ORDER — CYANOCOBALAMIN 1000 MCG/ML IJ SOLN
1000.0000 ug | Freq: Once | INTRAMUSCULAR | Status: AC
  Administered 2017-03-10: 1000 ug via INTRAMUSCULAR

## 2017-03-30 ENCOUNTER — Ambulatory Visit (INDEPENDENT_AMBULATORY_CARE_PROVIDER_SITE_OTHER): Payer: Medicaid Other

## 2017-03-30 ENCOUNTER — Encounter: Payer: Self-pay | Admitting: Podiatry

## 2017-03-30 ENCOUNTER — Ambulatory Visit (INDEPENDENT_AMBULATORY_CARE_PROVIDER_SITE_OTHER): Payer: Medicaid Other | Admitting: Podiatry

## 2017-03-30 DIAGNOSIS — S92514S Nondisplaced fracture of proximal phalanx of right lesser toe(s), sequela: Secondary | ICD-10-CM | POA: Diagnosis not present

## 2017-03-30 DIAGNOSIS — L600 Ingrowing nail: Secondary | ICD-10-CM | POA: Diagnosis not present

## 2017-03-30 DIAGNOSIS — R609 Edema, unspecified: Secondary | ICD-10-CM

## 2017-03-30 NOTE — Progress Notes (Signed)
   Subjective:    Patient ID: [name], female    DOB: 30-Jul-1971, 46 y.o.   MRN: [id number]  HPI  46 year old female presents the office today for concerns of right second toe pain. She states that she fell back in April injuring the toe she had x-rays performed recently which were negative for fracture. She states that since then the toe is been swelling and his been difficult to bend and she knows that it sits up somewhat. She states that she is on her feet quite a bit which is aggravated her symptoms. She denies any recent injury or trauma since then.  She also has secondary concerns of a possible ingrown toenail left big toe which started become more symptomatic in April of this year. She denies any redness or drainage or any swelling from toenail sites. Nail is painful to wear shoes and pressure at times. She has no other concerns today.   Review of Systems  HENT: Positive for sneezing.   Endocrine:       Excessive thirst   Genitourinary: Positive for urgency.  Skin:       Change in nails  Neurological: Positive for dizziness, weakness, light-headedness and numbness.  All other systems reviewed and are negative.      Objective:   Physical Exam General: AAO x3, NAD  Dermatological: Mild incurvation on both medial and lateral assess left hallux toenail with mild to palpation there is no edema, erythema, drainage or pus any clinical signs of infection as it. There are no open sores, no preulcerative lesions, no rash or signs of infection present.  Vascular: Dorsalis Pedis artery and Posterior Tibial artery pedal pulses are 2/4 bilateral with immedate capillary fill time. There is no pain with calf compression, swelling, warmth, erythema.   Neruologic: Grossly intact via light touch bilateral. Vibratory intact via tuning fork bilateral. Protective threshold with Semmes Wienstein monofilament intact to all pedal sites bilateral.   Musculoskeletal: There is chronic edema to the  right second toe the distal dressing is subtly dorsiflexed position and there is no range of motion at the PIPJ. There is tenderness the proximal pharynx. No other area of tenderness identified. Muscular strength 5/5 in all groups tested bilateral.  Gait: Unassisted, Nonantalgic.     Assessment & Plan:  46 year old female right second toe fracture, ingrown toenail left hallux -Treatment options discussed including all alternatives, risks, and complications -Etiology of symptoms were discussed -X-rays were obtained and reviewed with the patient. Oblique fracture the proximal pharynx the right second toe at the callus formation present.  -At this point given the fracture in the healing discussed the possible surgical intervention however this point given her job she wishes to hold off on this. Continue taping to help with swelling as well as wearing a stiffer soled shoe. -Debrided left hallux toenail any competitions are clean. If symptoms continue partial nail avulsion. However due to work she wishes to hold off.  [name], [name]

## 2017-03-30 NOTE — Patient Instructions (Signed)
Toe Fracture A toe fracture is a break in one of the toe bones (phalanges). What are the causes? This condition may be caused by:  Dropping a heavy object on your toe.  Stubbing your toe.  Overusing your toe or doing repetitive exercise.  Twisting or stretching your toe out of place.  What increases the risk? This condition is more likely to develop in people who:  Play contact sports.  Have a bone disease.  Have a low calcium level.  What are the signs or symptoms? The main symptoms of this condition are swelling and pain in the toe. The pain may get worse with standing or walking. Other symptoms include:  Bruising.  Stiffness.  Numbness.  A change in the way the toe looks.  Broken bones that poke through the skin.  Blood beneath the toenail.  How is this diagnosed? This condition is diagnosed with a physical exam. You may also have X-rays. How is this treated? Treatment for this condition depends on the type of fracture and its severity. Treatment may involve:  Taping the broken toe to a toe that is next to it (buddy taping). This is the most common treatment for fractures in which the bone has not moved out of place (nondisplaced fracture).  Wearing a shoe that has a wide, rigid sole to protect the toe and to limit its movement.  Wearing a walking cast.  Having a procedure to move the toe back into place.  Surgery. This may be needed: ? If there are many pieces of broken bone that are out of place (displaced). ? If the toe joint breaks. ? If the bone breaks through the skin.  Physical therapy. This is done to help regain movement and strength in the toe.  You may need follow-up X-rays to make sure that the bone is healing well and staying in position. Follow these instructions at home: If you have a cast:  Do not stick anything inside the cast to scratch your skin. Doing that increases your risk of infection.  Check the skin around the cast every day.  Report any concerns to your health care provider. You may put lotion on dry skin around the edges of the cast. Do not apply lotion to the skin underneath the cast.  Do not put pressure on any part of the cast until it is fully hardened. This may take several hours.  Keep the cast clean and dry. Bathing  Do not take baths, swim, or use a hot tub until your health care provider approves. Ask your health care provider if you can take showers. You may only be allowed to take sponge baths for bathing.  If your health care provider approves bathing and showering, cover the cast or bandage (dressing) with a watertight plastic bag to protect it from water. Do not let the cast or dressing get wet. Managing pain, stiffness, and swelling  If you do not have a cast, apply ice to the injured area, if directed. ? Put ice in a plastic bag. ? Place a towel between your skin and the bag. ? Leave the ice on for 20 minutes, 2-3 times per day.  Move your toes often to avoid stiffness and to lessen swelling.  Raise (elevate) the injured area above the level of your heart while you are sitting or lying down. Driving  Do not drive or operate heavy machinery while taking pain medicine.  Do not drive while wearing a cast on a foot that you   use for driving. Activity  Return to your normal activities as directed by your health care provider. Ask your health care provider what activities are safe for you.  Perform exercises daily as directed by your health care provider or physical therapist. Safety  Do not use the injured limb to support your body weight until your health care provider says that you can. Use crutches or other assistive devices as directed by your health care provider. General instructions  If your toe was treated with buddy taping, follow your health care provider's instructions for changing the gauze and tape. Change it more often: ? The gauze and tape get wet. If this happens, dry the  space between the toes. ? The gauze and tape are too tight and cause your toe to become pale or numb.  Wear a protective shoe as directed by your health care provider. If you were not given a protective shoe, wear sturdy, supportive shoes. Your shoes should not pinch your toes and should not fit tightly against your toes.  Do not use any tobacco products, including cigarettes, chewing tobacco, or e-cigarettes. Tobacco can delay bone healing. If you need help quitting, ask your health care provider.  Take medicines only as directed by your health care provider.  Keep all follow-up visits as directed by your health care provider. This is important. Contact a health care provider if:  You have a fever.  Your pain medicine is not helping.  Your toe is cold.  Your toe is numb.  You still have pain after one week of rest and treatment.  You still have pain after your health care provider has said that you can start walking again.  You have pain, tingling, or numbness in your foot that is not going away. Get help right away if:  You have severe pain.  You have redness or inflammation in your toe that is getting worse.  You have pain or numbness in your toe that is getting worse.  Your toe turns blue. This information is not intended to replace advice given to you by your health care provider. Make sure you discuss any questions you have with your health care provider. Document Released: 08/26/2000 Document Revised: 05/02/2016 Document Reviewed: 06/25/2014 Elsevier Interactive Patient Education  2018 Elsevier Inc.  

## 2017-03-31 ENCOUNTER — Ambulatory Visit (INDEPENDENT_AMBULATORY_CARE_PROVIDER_SITE_OTHER): Payer: Medicaid Other | Admitting: Neurology

## 2017-03-31 DIAGNOSIS — E538 Deficiency of other specified B group vitamins: Secondary | ICD-10-CM | POA: Diagnosis not present

## 2017-03-31 MED ORDER — CYANOCOBALAMIN 1000 MCG/ML IJ SOLN
1000.0000 ug | Freq: Once | INTRAMUSCULAR | Status: AC
  Administered 2017-03-31: 1000 ug via INTRAMUSCULAR

## 2017-04-05 DIAGNOSIS — L600 Ingrowing nail: Secondary | ICD-10-CM | POA: Insufficient documentation

## 2017-04-05 DIAGNOSIS — S92514A Nondisplaced fracture of proximal phalanx of right lesser toe(s), initial encounter for closed fracture: Secondary | ICD-10-CM | POA: Insufficient documentation

## 2017-04-06 ENCOUNTER — Telehealth: Payer: Self-pay | Admitting: *Deleted

## 2017-04-06 NOTE — Telephone Encounter (Signed)
Medical Assistant left message on patient's home and cell voicemail. Voicemail states to give a call back to [place] with [facility] at [phone]. Patient is aware of WBC being elevated for year and RBC being elevated recently. Patient advised to omit any alcohol consumption. Patient is also aware of infection being noted in the urine and needing to complete the antibiotics.

## 2017-04-06 NOTE — Telephone Encounter (Signed)
-----   Message from [place] Donovan, [place] sent at 03/06/2017 11:37 AM EDT ----- Your white blood cell count is elevated, but it appears it has been this way for years.  Your red blood cell volume is also increased.  If you drink alcohol at all, I would stop because this might help.  The thyroid test was normal.  Your urine culture definitely showed infection.  Make sure and take all your antibiotics and drink plenty of water.  Follow-up if your symptoms persist after treatment.  Thanks, Freeman Caldron, PA-C

## 2017-04-07 ENCOUNTER — Encounter: Payer: Self-pay | Admitting: Family Medicine

## 2017-04-07 ENCOUNTER — Ambulatory Visit: Payer: Medicaid Other | Attending: Family Medicine | Admitting: Family Medicine

## 2017-04-07 VITALS — BP 124/75 | HR 82 | Temp 98.2°F | Resp 18 | Ht 65.0 in | Wt 118.0 lb

## 2017-04-07 DIAGNOSIS — G629 Polyneuropathy, unspecified: Secondary | ICD-10-CM | POA: Diagnosis not present

## 2017-04-07 DIAGNOSIS — Z9851 Tubal ligation status: Secondary | ICD-10-CM | POA: Insufficient documentation

## 2017-04-07 DIAGNOSIS — E876 Hypokalemia: Secondary | ICD-10-CM | POA: Diagnosis not present

## 2017-04-07 DIAGNOSIS — I10 Essential (primary) hypertension: Secondary | ICD-10-CM | POA: Diagnosis not present

## 2017-04-07 DIAGNOSIS — R636 Underweight: Secondary | ICD-10-CM | POA: Insufficient documentation

## 2017-04-07 DIAGNOSIS — Z79899 Other long term (current) drug therapy: Secondary | ICD-10-CM | POA: Insufficient documentation

## 2017-04-07 DIAGNOSIS — G6289 Other specified polyneuropathies: Secondary | ICD-10-CM | POA: Diagnosis not present

## 2017-04-07 DIAGNOSIS — X58XXXA Exposure to other specified factors, initial encounter: Secondary | ICD-10-CM | POA: Diagnosis not present

## 2017-04-07 DIAGNOSIS — S92911A Unspecified fracture of right toe(s), initial encounter for closed fracture: Secondary | ICD-10-CM | POA: Diagnosis not present

## 2017-04-07 DIAGNOSIS — Z0001 Encounter for general adult medical examination with abnormal findings: Secondary | ICD-10-CM | POA: Diagnosis present

## 2017-04-07 DIAGNOSIS — K219 Gastro-esophageal reflux disease without esophagitis: Secondary | ICD-10-CM | POA: Insufficient documentation

## 2017-04-07 DIAGNOSIS — G4700 Insomnia, unspecified: Secondary | ICD-10-CM | POA: Insufficient documentation

## 2017-04-07 DIAGNOSIS — G4709 Other insomnia: Secondary | ICD-10-CM

## 2017-04-07 MED ORDER — MIRTAZAPINE 15 MG PO TABS: 15.0000 mg | ORAL_TABLET | Freq: Every day | ORAL | 5 refills | Status: DC

## 2017-04-07 MED ORDER — AMLODIPINE BESYLATE 10 MG PO TABS: 10.0000 mg | ORAL_TABLET | Freq: Every day | ORAL | 6 refills | Status: DC

## 2017-04-07 MED ORDER — DULOXETINE HCL 60 MG PO CPEP: ORAL_CAPSULE | ORAL | 6 refills | Status: DC

## 2017-04-07 MED ORDER — PREGABALIN 75 MG PO CAPS: 75.0000 mg | ORAL_CAPSULE | Freq: Two times a day (BID) | ORAL | 3 refills | Status: DC

## 2017-04-07 MED ORDER — OMEPRAZOLE 20 MG PO CPDR: 20.0000 mg | DELAYED_RELEASE_CAPSULE | Freq: Every day | ORAL | 5 refills | Status: DC

## 2017-04-07 MED ORDER — HYDRALAZINE HCL 10 MG PO TABS: 10.0000 mg | ORAL_TABLET | Freq: Three times a day (TID) | ORAL | 5 refills | Status: DC

## 2017-04-07 MED FILL — DULoxetine HCL 60 MG CPEP: 60 | 30 days supply | Qty: 60 | Fill #0

## 2017-04-07 MED FILL — OMEPRAZOLE DR 20 MG CAPSULE: 20 | 30 days supply | Qty: 30 | Fill #0

## 2017-04-07 MED FILL — MIRTAZAPINE 15 MG TABLET: 15 | 30 days supply | Qty: 30 | Fill #0

## 2017-04-07 MED FILL — hydrALAZINE HCL 10 MG TABS: 10 | 30 days supply | Qty: 90 | Fill #0

## 2017-04-07 MED FILL — AMLODIPINE BESYLATE 10 MG T: 10 | 30 days supply | Qty: 30 | Fill #0

## 2017-04-07 NOTE — Progress Notes (Signed)
Subjective:  Patient ID: [name], female    DOB: Jan 28, 1971  Age: 46 y.o. 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  She takes gabapentin for neuropathy but complains that this is ineffective as she has 10 hour work days where she is on her feet with worsening of the burning in her legs. Also taking  She was p and Cymbalta.reviously followed by [facility] neurology associates but was discharged due to the fact that she missed several appointments.   She has been compliant with antihypertensives and is requesting refills; reflux symptoms have been controlled on omeprazole and reflux symptoms returned when she misses her omeprazole.   Seen last week by [place] due to right second toe fracture and taping recommended as well as NSAIDs.   Past Medical History:  Diagnosis Date  . History of alcoholism ([place])   . Hypertension   . Malnutrition ([place])   . Peripheral neuropathy   . Strachan's syndrome     Past Surgical History:  Procedure Laterality Date  . MULTIPLE EXTRACTIONS WITH ALVEOLOPLASTY N/A 12/25/2013   Procedure: Extraction of tooth #'s 2,3,4,5,6,8,9,10,11,12,13,14,15,20,21,22,23,24,25,26,27, [phone] with alveoloplasty and bilateral mandibular tori reductions;  Surgeon: Lenn Cal, DDS;  Location: [place];  Service: Oral Surgery;  Laterality: N/A;  . SURAL NERVE BX Right 12/24/2013   Procedure: SURAL NERVE BIOPSY;  Surgeon: Kristeen Miss, MD;  Location: [place] [facility];  Service: Neurosurgery;  Laterality: Right;  . TUBAL LIGATION      Allergies  Allergen Reactions  . Macrobid [Nitrofurantoin Macrocrystal] Anaphylaxis and Other (See Comments)    Patient reports 2 syncope episodes after her mouth became numb     Outpatient Medications Prior to Visit  Medication Sig Dispense Refill  .  Cyanocobalamin (VITAMIN B-12 IJ) Inject as directed once a week.     . folic acid (FOLVITE) 1 MG tablet Take 1 tablet (1 mg total) by mouth daily.    Marland Kitchen gabapentin (NEURONTIN) 600 MG tablet TAKE 1 TABLET EVERY MORNING AND 2 TABLETS IN THE EVENING. 90 tablet 0  . Multiple Vitamins-Minerals (MULTIVITAMIN WITH MINERALS) tablet Take 1 tablet by mouth daily.    . potassium chloride (K-DUR,KLOR-CON) 10 MEQ tablet Take 10 mEq by mouth daily.    . promethazine (PHENERGAN) 25 MG tablet Take 1 tablet (25 mg total) by mouth every 8 (eight) hours as needed for nausea or vomiting. 20 tablet 0  . amLODipine (NORVASC) 10 MG tablet TAKE 1 TABLET BY MOUTH DAILY 30 tablet 0  . DULoxetine (CYMBALTA) 60 MG capsule TAKE 1 CAPSULE BY MOUTH 2 TIMES DAILY. 60 capsule 0  . hydrALAZINE (APRESOLINE) 10 MG tablet TAKE 1 TABLET BY MOUTH 3 TIMES DAILY. 90 tablet 0  . omeprazole (PRILOSEC) 20 MG capsule TAKE 1 CAPSULE BY MOUTH DAILY. 30 capsule 0  . sulfamethoxazole-trimethoprim (BACTRIM DS,SEPTRA DS) 800-160 MG tablet Take 1 tablet by mouth 2 (two) times daily. 14 tablet 0  . phenazopyridine (PYRIDIUM) 100 MG tablet Take 1 tablet (100 mg total) by mouth 3 (three) times daily as needed for pain. With urination (Patient not taking: Reported on 04/07/2017) 10 tablet 0   No facility-administered medications prior to visit.     ROS Review of Systems  Constitutional: Negative for activity change, appetite change and fatigue.  HENT: Negative for congestion, sinus  pressure and sore throat.   Eyes: Negative for visual disturbance.  Respiratory: Negative for cough, chest tightness, shortness of breath and wheezing.   Cardiovascular: Negative for chest pain and palpitations.  Gastrointestinal: Negative for abdominal distention, abdominal pain and constipation.  Endocrine: Negative for polydipsia.  Genitourinary: Negative for dysuria and frequency.  Musculoskeletal:       See hpi  Skin: Negative for rash.  Neurological: Positive  for numbness. Negative for tremors and light-headedness.  Hematological: Does not bruise/bleed easily.  Psychiatric/Behavioral: Negative for agitation and behavioral problems.    Objective:  BP 124/75 (BP Location: Left Arm, Patient Position: Sitting, Cuff Size: Normal)   Pulse 82   Temp 98.2 F (36.8 C) (Oral)   Resp 18   Ht 5\' 5"  (1.651 m)   Wt 118 lb (53.5 kg)   LMP 04/07/2017   SpO2 100%   BMI 19.64 kg/m   BP/Weight 04/07/2017 03/01/2017 04/23/7516  Systolic BP 001 749 -  Diastolic BP 75 86 -  Wt. (Lbs) 118 - 120  BMI 19.64 - 19.97      Physical Exam  Constitutional: She is oriented to person, place, and time. She appears well-developed and well-nourished.  Cardiovascular: Normal rate, normal heart sounds and intact distal pulses.   No murmur heard. Pulmonary/Chest: Effort normal and breath sounds normal. She has no wheezes. She has no rales. She exhibits no tenderness.  Abdominal: Soft. Bowel sounds are normal. She exhibits no distension and no mass. There is no tenderness.  Musculoskeletal: Normal range of motion. She exhibits edema (edema of right second toe) and tenderness (Tenderness on palpation of right second toe).  Neurological: She is alert and oriented to person, place, and time.  Skin: Skin is warm and dry.  Psychiatric: She has a normal mood and affect.     Assessment & Plan:   1. Essential hypertension Controlled - amLODipine (NORVASC) 10 MG tablet; Take 1 tablet (10 mg total) by mouth daily.  Dispense: 30 tablet; Refill: 6 - hydrALAZINE (APRESOLINE) 10 MG tablet; Take 1 tablet (10 mg total) by mouth 3 (three) times daily.  Dispense: 90 tablet; Refill: 5  2. Other polyneuropathy Uncontrolled and gabapentin We'll switch to Lyrica and obtain prior authorization - DULoxetine (CYMBALTA) 60 MG capsule; TAKE 1 CAPSULE BY MOUTH 2 TIMES DAILY.  Dispense: 60 capsule; Refill: 6 - pregabalin (LYRICA) 75 MG capsule; Take 1 capsule (75 mg total) by mouth 2 (two)  times daily. Discontinue Gabapentin once Lyrica is obtained  Dispense: 60 capsule; Refill: 3  3. Gastroesophageal reflux disease, esophagitis presence not specified Controlled - omeprazole (PRILOSEC) 20 MG capsule; Take 1 capsule (20 mg total) by mouth daily.  Dispense: 30 capsule; Refill: 5  4. Other insomnia Sleep hygiene We'll commence Remeron - mirtazapine (REMERON) 15 MG tablet; Take 1 tablet (15 mg total) by mouth at bedtime. For weight loss and insomnia  Dispense: 30 tablet; Refill: 5  5. Underweight Normal thyroid function - mirtazapine (REMERON) 15 MG tablet; Take 1 tablet (15 mg total) by mouth at bedtime. For weight loss and insomnia  Dispense: 30 tablet; Refill: 5  6. Hypokalemia Currently taking OTC supplements If hypokalemic will prescribe potassium replacement - Basic Metabolic Panel  7. Right second toe fracture Management as per podiatry.   Meds ordered this encounter  Medications  . amLODipine (NORVASC) 10 MG tablet    Sig: Take 1 tablet (10 mg total) by mouth daily.    Dispense:  30 tablet    Refill:  6  . DULoxetine (CYMBALTA) 60 MG capsule    Sig: TAKE 1 CAPSULE BY MOUTH 2 TIMES DAILY.    Dispense:  60 capsule    Refill:  6  . hydrALAZINE (APRESOLINE) 10 MG tablet    Sig: Take 1 tablet (10 mg total) by mouth 3 (three) times daily.    Dispense:  90 tablet    Refill:  5  . omeprazole (PRILOSEC) 20 MG capsule    Sig: Take 1 capsule (20 mg total) by mouth daily.    Dispense:  30 capsule    Refill:  5  . mirtazapine (REMERON) 15 MG tablet    Sig: Take 1 tablet (15 mg total) by mouth at bedtime. For weight loss and insomnia    Dispense:  30 tablet    Refill:  5  . pregabalin (LYRICA) 75 MG capsule    Sig: Take 1 capsule (75 mg total) by mouth 2 (two) times daily. Discontinue Gabapentin once Lyrica is obtained    Dispense:  60 capsule    Refill:  3    Follow-up: Return in about 1 month (around 05/08/2017) for complete physical exam.   Arnoldo Morale  MD

## 2017-04-07 NOTE — Patient Instructions (Signed)
Food Choices for Gastroesophageal Reflux Disease, Adult When you have gastroesophageal reflux disease (GERD), the foods you eat and your eating habits are very important. Choosing the right foods can help ease the discomfort of GERD. Consider working with a diet and nutrition specialist (dietitian) to help you make healthy food choices. What general guidelines should I follow? Eating plan  Choose healthy foods low in fat, such as fruits, vegetables, whole grains, low-fat dairy products, and lean meat, fish, and poultry.  Eat frequent, small meals instead of three large meals each day. Eat your meals slowly, in a relaxed setting. Avoid bending over or lying down until 2-3 hours after eating.  Limit high-fat foods such as fatty meats or fried foods.  Limit your intake of oils, butter, and shortening to less than 8 teaspoons each day.  Avoid the following: ? Foods that cause symptoms. These may be different for different people. Keep a food diary to keep track of foods that cause symptoms. ? Alcohol. ? Drinking large amounts of liquid with meals. ? Eating meals during the 2-3 hours before bed.  Cook foods using methods other than frying. This may include baking, grilling, or broiling. Lifestyle   Maintain a healthy weight. Ask your health care provider what weight is healthy for you. If you need to lose weight, work with your health care provider to do so safely.  Exercise for at least 30 minutes on 5 or more days each week, or as told by your health care provider.  Avoid wearing clothes that fit tightly around your waist and chest.  Do not use any products that contain nicotine or tobacco, such as cigarettes and e-cigarettes. If you need help quitting, ask your health care provider.  Sleep with the head of your bed raised. Use a wedge under the mattress or blocks under the bed frame to raise the head of the bed. What foods are not recommended? The items listed may not be a complete  list. Talk with your dietitian about what dietary choices are best for you. Grains Pastries or quick breads with added fat. French toast. Vegetables Deep fried vegetables. French fries. Any vegetables prepared with added fat. Any vegetables that cause symptoms. For some people this may include tomatoes and tomato products, chili peppers, onions and garlic, and horseradish. Fruits Any fruits prepared with added fat. Any fruits that cause symptoms. For some people this may include citrus fruits, such as oranges, grapefruit, pineapple, and lemons. Meats and other protein foods High-fat meats, such as fatty beef or pork, hot dogs, ribs, ham, sausage, salami and bacon. Fried meat or protein, including fried fish and fried chicken. Nuts and nut butters. Dairy Whole milk and chocolate milk. Sour cream. Cream. Ice cream. Cream cheese. Milk shakes. Beverages Coffee and tea, with or without caffeine. Carbonated beverages. Sodas. Energy drinks. Fruit juice made with acidic fruits (such as orange or grapefruit). Tomato juice. Alcoholic drinks. Fats and oils Butter. Margarine. Shortening. Ghee. Sweets and desserts Chocolate and cocoa. Donuts. Seasoning and other foods Pepper. Peppermint and spearmint. Any condiments, herbs, or seasonings that cause symptoms. For some people, this may include curry, hot sauce, or vinegar-based salad dressings. Summary  When you have gastroesophageal reflux disease (GERD), food and lifestyle choices are very important to help ease the discomfort of GERD.  Eat frequent, small meals instead of three large meals each day. Eat your meals slowly, in a relaxed setting. Avoid bending over or lying down until 2-3 hours after eating.  Limit high-fat   foods such as fatty meat or fried foods. This information is not intended to replace advice given to you by your health care provider. Make sure you discuss any questions you have with your health care provider. Document Released:  08/29/2005 Document Revised: 08/30/2016 Document Reviewed: 08/30/2016 Elsevier Interactive Patient Education  2017 Elsevier Inc.  

## 2017-04-08 LAB — BASIC METABOLIC PANEL
BUN/Creatinine Ratio: 15 (ref 9–23)
BUN: 8 mg/dL (ref 6–24)
CO2: 24 mmol/L (ref 20–29)
Calcium: 9.3 mg/dL (ref 8.7–10.2)
Chloride: 97 mmol/L (ref 96–106)
Creatinine, Ser: 0.53 mg/dL — ABNORMAL LOW (ref 0.57–1.00)
GFR calc Af Amer: 133 mL/min/{1.73_m2} (ref >59)
GFR calc non Af Amer: 115 mL/min/{1.73_m2} (ref >59)
Glucose: 87 mg/dL (ref 65–99)
Potassium: 4.4 mmol/L (ref 3.5–5.2)
Sodium: 138 mmol/L (ref 134–144)

## 2017-04-11 ENCOUNTER — Encounter: Payer: Self-pay | Admitting: Pharmacist

## 2017-04-11 NOTE — Progress Notes (Signed)
Prior authorization for Lyrica completed and approved.  Medicaid Approval #[id number]

## 2017-04-27 ENCOUNTER — Ambulatory Visit: Payer: Medicaid Other | Admitting: Podiatry

## 2017-05-01 ENCOUNTER — Ambulatory Visit (INDEPENDENT_AMBULATORY_CARE_PROVIDER_SITE_OTHER): Payer: Medicaid Other

## 2017-05-01 ENCOUNTER — Ambulatory Visit (INDEPENDENT_AMBULATORY_CARE_PROVIDER_SITE_OTHER): Payer: Medicaid Other | Admitting: Podiatry

## 2017-05-01 ENCOUNTER — Encounter: Payer: Self-pay | Admitting: Podiatry

## 2017-05-01 DIAGNOSIS — T1490XA Injury, unspecified, initial encounter: Secondary | ICD-10-CM | POA: Diagnosis not present

## 2017-05-01 DIAGNOSIS — S92501K Displaced unspecified fracture of right lesser toe(s), subsequent encounter for fracture with nonunion: Secondary | ICD-10-CM

## 2017-05-03 NOTE — Progress Notes (Signed)
Subjective: Clessie presents the office they for follow-up evaluation of right second toe fracture. She has remained in the surgical shoe. She states that she is doing about the same. No recent injury. She does continue to work for [employer] and she is on her feet a lot. She states their season is over at the end of the month. She has no other concerns.  Denies any systemic complaints such as fevers, chills, nausea, vomiting. No acute changes since last appointment, and no other complaints at this time.   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 There is tenderness along the PIPJ as well as the proximal phalanx. There is mild pain with MTPJ range of motion. There is no area of pain along the metatarsal. There is no other areas of swelling or pain.  No open lesions or pre-ulcerative lesions.  No pain with calf compression, swelling, warmth, erythema  Assessment: Right second toe fracture  Plan: -All treatment options discussed with the patient including all alternatives, risks, complications.  -X-rays were obtained and reviewed with the patient. Nonunion of proximal phalanx fracture evident. Fracture extends the MPJ. -At this point may continue the bone stimulator insurance approves. Ultimately she is likely need surgical intervention. Osteolock in 4 weeks at that point her job will be over and she may be a surgery int if needed. Discussed essentially a hammertoe repair with MTPJ evaluation and debridement to help save the joint.  -Patient encouraged to call the office with any questions, concerns, change in symptoms.   [name], [name]

## 2017-05-04 ENCOUNTER — Encounter: Payer: Self-pay | Admitting: Podiatry

## 2017-05-04 ENCOUNTER — Telehealth: Payer: Self-pay | Admitting: *Deleted

## 2017-05-04 NOTE — Telephone Encounter (Signed)
Lytle Michaels states Dr. Jacqualyn Posey is wanting to get Exogen Bone Growth stimulator. Clinicals given to [place] for [employer] for [employer].

## 2017-05-04 NOTE — Progress Notes (Signed)
Upon evaluation of the x-rays from 12/24/2016 I independently reviewed the x-rays. Upon evaluation on the oblique x-ray there is evidence of a radiolucent line consistent with a fracture.

## 2017-05-10 MED FILL — DULoxetine HCL 60 MG CPEP: 60 | 30 days supply | Qty: 60 | Fill #1

## 2017-05-10 MED FILL — MIRTAZAPINE 15 MG TABLET: 15 | 30 days supply | Qty: 30 | Fill #1

## 2017-05-10 MED FILL — OMEPRAZOLE DR 20 MG CAPSULE: 20 | 30 days supply | Qty: 30 | Fill #1

## 2017-05-10 MED FILL — hydrALAZINE HCL 10 MG TABS: 10 | 30 days supply | Qty: 90 | Fill #1

## 2017-05-22 ENCOUNTER — Ambulatory Visit: Payer: Self-pay | Admitting: Neurology

## 2017-05-24 ENCOUNTER — Other Ambulatory Visit (HOSPITAL_COMMUNITY)
Admission: RE | Admit: 2017-05-24 | Discharge: 2017-05-24 | Disposition: A | Discharge status: Home / Self Care | Payer: Medicaid Other | Source: Ambulatory Visit | Attending: Family Medicine | Admitting: Family Medicine

## 2017-05-24 ENCOUNTER — Ambulatory Visit: Payer: Medicaid Other | Attending: Family Medicine | Admitting: Family Medicine

## 2017-05-24 ENCOUNTER — Ambulatory Visit (INDEPENDENT_AMBULATORY_CARE_PROVIDER_SITE_OTHER): Payer: Medicaid Other | Admitting: *Deleted

## 2017-05-24 ENCOUNTER — Encounter: Payer: Self-pay | Admitting: Family Medicine

## 2017-05-24 VITALS — BP 112/73 | HR 78 | Temp 98.5°F | Ht 65.0 in | Wt 127.4 lb

## 2017-05-24 DIAGNOSIS — F1021 Alcohol dependence, in remission: Secondary | ICD-10-CM | POA: Diagnosis not present

## 2017-05-24 DIAGNOSIS — G629 Polyneuropathy, unspecified: Secondary | ICD-10-CM | POA: Diagnosis not present

## 2017-05-24 DIAGNOSIS — Z881 Allergy status to other antibiotic agents status: Secondary | ICD-10-CM | POA: Insufficient documentation

## 2017-05-24 DIAGNOSIS — I10 Essential (primary) hypertension: Secondary | ICD-10-CM | POA: Diagnosis not present

## 2017-05-24 DIAGNOSIS — Z0000 Encounter for general adult medical examination without abnormal findings: Secondary | ICD-10-CM

## 2017-05-24 DIAGNOSIS — E538 Deficiency of other specified B group vitamins: Secondary | ICD-10-CM

## 2017-05-24 DIAGNOSIS — Z124 Encounter for screening for malignant neoplasm of cervix: Secondary | ICD-10-CM | POA: Diagnosis not present

## 2017-05-24 DIAGNOSIS — B9689 Other specified bacterial agents as the cause of diseases classified elsewhere: Secondary | ICD-10-CM | POA: Diagnosis not present

## 2017-05-24 DIAGNOSIS — Z0001 Encounter for general adult medical examination with abnormal findings: Secondary | ICD-10-CM | POA: Diagnosis not present

## 2017-05-24 DIAGNOSIS — Z79899 Other long term (current) drug therapy: Secondary | ICD-10-CM | POA: Diagnosis not present

## 2017-05-24 DIAGNOSIS — Z1231 Encounter for screening mammogram for malignant neoplasm of breast: Secondary | ICD-10-CM

## 2017-05-24 DIAGNOSIS — Z9889 Other specified postprocedural states: Secondary | ICD-10-CM | POA: Insufficient documentation

## 2017-05-24 DIAGNOSIS — Z23 Encounter for immunization: Secondary | ICD-10-CM

## 2017-05-24 DIAGNOSIS — Z1239 Encounter for other screening for malignant neoplasm of breast: Secondary | ICD-10-CM

## 2017-05-24 MED ORDER — CYANOCOBALAMIN 1000 MCG/ML IJ SOLN
1000.0000 ug | Freq: Once | INTRAMUSCULAR | Status: AC
  Administered 2017-05-24: 1000 ug via INTRAMUSCULAR

## 2017-05-24 NOTE — Progress Notes (Signed)
Subjective:  Patient ID: [name], female    DOB: 03-14-1971  Age: 46 y.o. MRN: [id number]  CC: Annual Exam   HPI [name] presents for a complete physical exam.  Past Medical History:  Diagnosis Date  . History of alcoholism ([place])   . Hypertension   . Malnutrition ([place])   . Peripheral neuropathy   . Strachan's syndrome     Past Surgical History:  Procedure Laterality Date  . MULTIPLE EXTRACTIONS WITH ALVEOLOPLASTY N/A 12/25/2013   Procedure: Extraction of tooth #'s 2,3,4,5,6,8,9,10,11,12,13,14,15,20,21,22,23,24,25,26,27, [phone] with alveoloplasty and bilateral mandibular tori reductions;  Surgeon: Lenn Cal, DDS;  Location: [facility];  Service: Oral Surgery;  Laterality: N/A;  . SURAL NERVE BX Right 12/24/2013   Procedure: SURAL NERVE BIOPSY;  Surgeon: Kristeen Miss, MD;  Location: [facility];  Service: Neurosurgery;  Laterality: Right;  . TUBAL LIGATION      Allergies  Allergen Reactions  . Macrobid [Nitrofurantoin Macrocrystal] Anaphylaxis and Other (See Comments)    Patient reports 2 syncope episodes after her mouth became numb     Outpatient Medications Prior to Visit  Medication Sig Dispense Refill  . Cyanocobalamin (VITAMIN B-12 IJ) Inject as directed once a week.     . DULoxetine (CYMBALTA) 60 MG capsule TAKE 1 CAPSULE BY MOUTH 2 TIMES DAILY. 60 capsule 6  . folic acid (FOLVITE) 1 MG tablet Take 1 tablet (1 mg total) by mouth daily.    . hydrALAZINE (APRESOLINE) 10 MG tablet Take 1 tablet (10 mg total) by mouth 3 (three) times daily. 90 tablet 5  . mirtazapine (REMERON) 15 MG tablet Take 1 tablet (15 mg total) by mouth at bedtime. For weight loss and insomnia 30 tablet 5  . Multiple Vitamins-Minerals (MULTIVITAMIN WITH MINERALS) tablet Take 1 tablet by mouth daily.    Marland Kitchen omeprazole (PRILOSEC) 20 MG capsule Take 1 capsule (20 mg total) by mouth daily. 30 capsule 5  . potassium chloride (K-DUR,KLOR-CON) 10 MEQ tablet Take 10 mEq by mouth daily.    .  pregabalin (LYRICA) 75 MG capsule Take 1 capsule (75 mg total) by mouth 2 (two) times daily. Discontinue Gabapentin once Lyrica is obtained 60 capsule 3  . promethazine (PHENERGAN) 25 MG tablet Take 1 tablet (25 mg total) by mouth every 8 (eight) hours as needed for nausea or vomiting. 20 tablet 0  . amLODipine (NORVASC) 10 MG tablet Take 1 tablet (10 mg total) by mouth daily. (Patient not taking: Reported on 05/24/2017) 30 tablet 6  . gabapentin (NEURONTIN) 600 MG tablet TAKE 1 TABLET EVERY MORNING AND 2 TABLETS IN THE EVENING. (Patient not taking: Reported on 05/24/2017) 90 tablet 0  . phenazopyridine (PYRIDIUM) 100 MG tablet Take 1 tablet (100 mg total) by mouth 3 (three) times daily as needed for pain. With urination (Patient not taking: Reported on 04/07/2017) 10 tablet 0   No facility-administered medications prior to visit.     ROS Review of Systems  Constitutional: Negative for activity change, appetite change and fatigue.  HENT: Negative for congestion, sinus pressure and sore throat.   Eyes: Negative for visual disturbance.  Respiratory: Negative for cough, chest tightness, shortness of breath and wheezing.   Cardiovascular: Negative for chest pain and palpitations.  Gastrointestinal: Negative for abdominal distention, abdominal pain and constipation.  Endocrine: Negative for polydipsia.  Genitourinary: Negative for dysuria and frequency.  Musculoskeletal: Negative for arthralgias and back pain.  Skin: Negative for rash.  Neurological: Negative for tremors, light-headedness and numbness.  Hematological: Does not bruise/bleed easily.  Psychiatric/Behavioral: Negative for agitation and behavioral problems.    Objective:  BP 112/73   Pulse 78   Temp 98.5 F (36.9 C) (Oral)   Ht _0  (1.651 m)   Wt 127 lb 6.4 oz (57.8 kg)   SpO2 99%   BMI 21.20 kg/m   BP/Weight 05/24/2017 04/07/2017 01/28/3436  Systolic BP 357 897 847  Diastolic BP 73 75 86  Wt. (Lbs) 127.4 118 -  BMI 21.2  19.64 -      Physical Exam  Constitutional: She is oriented to person, place, and time. She appears well-developed and well-nourished. No distress.  HENT:  Head: Normocephalic.  Right Ear: External ear normal.  Left Ear: External ear normal.  Nose: Nose normal.  Mouth/Throat: Oropharynx is clear and moist.  Eyes: Pupils are equal, round, and reactive to light. Conjunctivae and EOM are normal.  Neck: Normal range of motion. No JVD present.  Cardiovascular: Normal rate, regular rhythm, normal heart sounds and intact distal pulses.  Exam reveals no gallop.   No murmur heard. Pulmonary/Chest: Effort normal and breath sounds normal. No respiratory distress. She has no wheezes. She has no rales. She exhibits no tenderness. Right breast exhibits no mass and no tenderness. Left breast exhibits mass (soft fluctuant 1.5 x 1.5 cm lump at 10 o'clock). Left breast exhibits no tenderness.  Abdominal: Soft. Bowel sounds are normal. She exhibits no distension and no mass. There is no tenderness.  Genitourinary:  Genitourinary Comments: External genitalia, vagina, cervix normal.  Musculoskeletal: Normal range of motion. She exhibits no edema or tenderness.  Neurological: She is alert and oriented to person, place, and time. She has normal reflexes.  Skin: Skin is warm and dry. She is not diaphoretic.  Psychiatric: She has a normal mood and affect.     Assessment & Plan:   1. Annual physical exam - CMP14+EGFR - Lipid panel  2. Need for influenza vaccination - Flu Vaccine QUAD 36+ mos IM  3. Healthcare maintenance - Tdap vaccine greater than or equal to 7yo IM  4. Screening for breast cancer - MM Digital Screening; Future  5. Screening for cervical cancer - Cytology - PAP [place]   No orders of the defined types were placed in this encounter.   Follow-up: Return in about 3 months (around 08/23/2017) for Follow-up on hypertension and neuropathy.   Arnoldo Morale MD

## 2017-05-24 NOTE — Patient Instructions (Signed)

## 2017-05-25 LAB — CMP14+EGFR
ALT: 7 IU/L (ref 0–32)
AST: 19 IU/L (ref 0–40)
Albumin/Globulin Ratio: 1.1 — ABNORMAL LOW (ref 1.2–2.2)
Albumin: 3.5 g/dL (ref 3.5–5.5)
Alkaline Phosphatase: 135 IU/L — ABNORMAL HIGH (ref 39–117)
BUN/Creatinine Ratio: 7 — ABNORMAL LOW (ref 9–23)
BUN: 4 mg/dL — ABNORMAL LOW (ref 6–24)
Bilirubin Total: <0.2 mg/dL (ref 0.0–1.2)
CO2: 26 mmol/L (ref 20–29)
Calcium: 8.6 mg/dL — ABNORMAL LOW (ref 8.7–10.2)
Chloride: 99 mmol/L (ref 96–106)
Creatinine, Ser: 0.61 mg/dL (ref 0.57–1.00)
GFR calc Af Amer: 127 mL/min/{1.73_m2} (ref >59)
GFR calc non Af Amer: 110 mL/min/{1.73_m2} (ref >59)
Globulin, Total: 3.2 g/dL (ref 1.5–4.5)
Glucose: 114 mg/dL — ABNORMAL HIGH (ref 65–99)
Potassium: 4.4 mmol/L (ref 3.5–5.2)
Sodium: 138 mmol/L (ref 134–144)
Total Protein: 6.7 g/dL (ref 6.0–8.5)

## 2017-05-25 LAB — CYTOLOGY - PAP
Bacterial vaginitis: POSITIVE — AB
Candida vaginitis: NEGATIVE
Chlamydia: NEGATIVE
Diagnosis: NEGATIVE
HPV: NOT DETECTED
Neisseria Gonorrhea: NEGATIVE
Trichomonas: NEGATIVE

## 2017-05-25 LAB — LIPID PANEL
Chol/HDL Ratio: 3.2 ratio (ref 0.0–4.4)
Cholesterol, Total: 161 mg/dL (ref 100–199)
HDL: 51 mg/dL (ref >39)
LDL Calculated: 91 mg/dL (ref 0–99)
Triglycerides: 95 mg/dL (ref 0–149)
VLDL Cholesterol Cal: 19 mg/dL (ref 5–40)

## 2017-05-29 ENCOUNTER — Ambulatory Visit (INDEPENDENT_AMBULATORY_CARE_PROVIDER_SITE_OTHER): Payer: Medicaid Other

## 2017-05-29 ENCOUNTER — Other Ambulatory Visit: Payer: Self-pay | Admitting: Family Medicine

## 2017-05-29 ENCOUNTER — Ambulatory Visit (INDEPENDENT_AMBULATORY_CARE_PROVIDER_SITE_OTHER): Payer: Medicaid Other | Admitting: Podiatry

## 2017-05-29 DIAGNOSIS — S92501K Displaced unspecified fracture of right lesser toe(s), subsequent encounter for fracture with nonunion: Secondary | ICD-10-CM

## 2017-05-29 DIAGNOSIS — M2041 Other hammer toe(s) (acquired), right foot: Secondary | ICD-10-CM

## 2017-05-29 DIAGNOSIS — M19071 Primary osteoarthritis, right ankle and foot: Secondary | ICD-10-CM

## 2017-05-29 MED ORDER — METRONIDAZOLE 0.75 % VA GEL: 1.0000 | Freq: Every day | VAGINAL | 0 refills | Status: DC

## 2017-05-29 NOTE — Patient Instructions (Signed)

## 2017-05-30 ENCOUNTER — Encounter: Payer: Self-pay | Admitting: Neurology

## 2017-05-30 LAB — CERVICOVAGINAL ANCILLARY ONLY: Herpes: NEGATIVE
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
## 2017-06-07 ENCOUNTER — Telehealth: Payer: Self-pay

## 2017-06-07 ENCOUNTER — Encounter: Payer: Self-pay | Admitting: Podiatry

## 2017-06-07 ENCOUNTER — Other Ambulatory Visit: Payer: Self-pay

## 2017-06-07 DIAGNOSIS — M2041 Other hammer toe(s) (acquired), right foot: Secondary | ICD-10-CM | POA: Diagnosis not present

## 2017-06-07 DIAGNOSIS — I10 Essential (primary) hypertension: Secondary | ICD-10-CM | POA: Diagnosis not present

## 2017-06-07 MED ORDER — OXYCODONE-ACETAMINOPHEN 5-325 MG PO TABS: 1.0000 | ORAL_TABLET | Freq: Three times a day (TID) | ORAL | 0 refills | Status: DC | PRN

## 2017-06-07 NOTE — Telephone Encounter (Signed)
Patient called stating that she was not getting any pain relief from the Rx written. Advised her to bring in remaining vicodin and we would write a Rx for percocet 5/325mg  per Dr Jacqualyn Posey

## 2017-06-07 NOTE — Progress Notes (Signed)
Pre-operative Note  Patient presents to the [facility] today for surgical intervention of the right foot for painful hammertoe to the 2nd digit due to malunion of a phalanx fracture and synovectomy of the 2nd MTPJ. The surgical consent was reviewed with the patient and we discussed the procedure as well as the postoperative course. I again discussed all alternatives, risks, complications. I answered all of their questions to the best of my ability and they wish to proceed with surgery. No promises or guarantees were given as to the outcome of the surgery.   The surgical consent was signed.   Patient is NPO since midnight.  The patient does not have have a history of blood clots or bleeding disorders.   No further questions.   [name], [place]

## 2017-06-08 ENCOUNTER — Telehealth: Payer: Self-pay | Admitting: *Deleted

## 2017-06-08 NOTE — Telephone Encounter (Signed)
Called patient and the patient stated that she was better today with the change of the medicine and I stated that if there was any concerns to call or go to the ER or the urgent care and that we would see her next week for her appointment. Lattie Haw

## 2017-06-12 ENCOUNTER — Ambulatory Visit (INDEPENDENT_AMBULATORY_CARE_PROVIDER_SITE_OTHER): Payer: Medicaid Other

## 2017-06-12 ENCOUNTER — Encounter: Payer: Self-pay | Admitting: Podiatry

## 2017-06-12 ENCOUNTER — Encounter: Payer: Medicaid Other | Admitting: Podiatry

## 2017-06-12 ENCOUNTER — Ambulatory Visit (INDEPENDENT_AMBULATORY_CARE_PROVIDER_SITE_OTHER): Payer: Medicaid Other | Admitting: Podiatry

## 2017-06-12 VITALS — BP 142/87 | HR 68 | Temp 96.2°F | Resp 16

## 2017-06-12 DIAGNOSIS — M2041 Other hammer toe(s) (acquired), right foot: Secondary | ICD-10-CM

## 2017-06-12 MED ORDER — OXYCODONE-ACETAMINOPHEN 5-325 MG PO TABS: 1.0000 | ORAL_TABLET | Freq: Three times a day (TID) | ORAL | 0 refills | Status: DC | PRN

## 2017-06-12 NOTE — Progress Notes (Signed)
Subjective:    Patient ID: [name], female   DOB: 46 y.o.   MRN: [id number]   HPI patient of Dr. Jacqualyn Posey presents stating the toe feels pretty good with mild discomfort if him on it too long and I been trying to elevate.    ROS      Objective:  Physical Exam neurovascular status was found to be intact negative Homan sign was noted with patient second digit being slightly elevated with stitches in place wound edges well coapted and no active drainage. There is mild edema in the toe and the patient has been relatively active which is probably contributory     Assessment:    Doing well post surgery with pin in place second digit all digits having good cap fill time and healing surgical site     Plan:    H&P and x-rays reviewed with patient. Sterile dressing reapplied instructed on continued elevation immobilization compression and I did rewrite her for 5 mg oxycodone to take over the next 7 days until she seen back. Encouraged to call or come in if any other issues should occur  X-rays indicate the toes in good alignment with slight distraction at the proximal interphalangeal joint perfusion occurred but the pin is in place and I do think it will heal with fibrous intact uneventfully this position but we will need to be splinted for a period of time

## 2017-06-14 ENCOUNTER — Other Ambulatory Visit: Payer: Self-pay | Admitting: Family Medicine

## 2017-06-14 DIAGNOSIS — Z1231 Encounter for screening mammogram for malignant neoplasm of breast: Secondary | ICD-10-CM

## 2017-06-14 MED FILL — DULoxetine HCL 60 MG CPEP: 60 | 30 days supply | Qty: 60 | Fill #2 | Status: TO

## 2017-06-14 MED FILL — MIRTAZAPINE 15 MG TABLET: 15 | 30 days supply | Qty: 30 | Fill #2 | Status: TO

## 2017-06-14 MED FILL — OMEPRAZOLE DR 20 MG CAPSULE: 20 | 30 days supply | Qty: 30 | Fill #2 | Status: TO

## 2017-06-14 MED FILL — hydrALAZINE HCL 10 MG TABS: 10 | 30 days supply | Qty: 90 | Fill #2 | Status: TO

## 2017-06-19 ENCOUNTER — Telehealth: Payer: Self-pay | Admitting: Podiatry

## 2017-06-19 ENCOUNTER — Encounter: Payer: Medicaid Other | Admitting: Podiatry

## 2017-06-19 NOTE — Progress Notes (Signed)
DOS 9.26.18 Rt foot 2nd hammertoe repair w cleaning of knuckle joint of 2nd toe

## 2017-06-19 NOTE — Telephone Encounter (Signed)
I have an appointment with Dr. Jacqualyn Posey on Wednesday but I was wanting to know if he can refill my pain medication until then. I ran out of it yesterday.

## 2017-06-20 MED ORDER — OXYCODONE-ACETAMINOPHEN 5-325 MG PO TABS: 1.0000 | ORAL_TABLET | Freq: Three times a day (TID) | ORAL | 0 refills | Status: DC | PRN

## 2017-06-20 NOTE — Telephone Encounter (Signed)
Dr. Jacqualyn Posey ordered refill oxycodone as previously, and have pt discuss discomfort at next office visit. Left message informing pt of Dr. Leigh Aurora orders and next office visit 06/21/2017 at 9:15am and to pick up the rx in the [place] office.

## 2017-06-20 NOTE — Addendum Note (Signed)
Addended by: Harriett Sine D on: 06/20/2017 11:57 AM   Modules accepted: Orders

## 2017-06-21 ENCOUNTER — Encounter: Payer: Self-pay | Admitting: Podiatry

## 2017-06-21 ENCOUNTER — Ambulatory Visit (INDEPENDENT_AMBULATORY_CARE_PROVIDER_SITE_OTHER): Payer: Medicaid Other | Admitting: Podiatry

## 2017-06-21 DIAGNOSIS — M2041 Other hammer toe(s) (acquired), right foot: Secondary | ICD-10-CM

## 2017-06-21 NOTE — Progress Notes (Signed)
Subjective: [name] is a 46 y.o. is seen today in office s/p right 2nd digit hammertoe repair preformed on 06/07/2017. She states that her pain is improved but she is having some soreness to the toe but the pain has improved. Denies any systemic complaints such as fevers, chills, nausea, vomiting. No calf pain, chest pain, shortness of breath.   Objective: General: No acute distress, AAOx3  DP/PT pulses palpable 2/4, CRT < 3 sec to all digits.  Protective sensation intact. Motor function intact.  Right foot: Incision is well coapted without any evidence of dehiscence and sutures are intact. There is mild movement across the incision. There is no surrounding erythema, ascending cellulitis, fluctuance, crepitus, malodor, drainage/purulence. There is mild edema around the surgical site. There is mild "soreness"along the surgical site. The toe is in a more rectus position compared to pre-op.  No other areas of tenderness to bilateral lower extremities.  No other open lesions or pre-ulcerative lesions.  No pain with calf compression, swelling, warmth, erythema.   Assessment and Plan:  Status post right hammertoe repair with MTPJ synovectomy, doing well with no complications   -Treatment options discussed including all alternatives, risks, and complications -Sutures are not ready to come out today. There is some mild movement across the incision. Antibiotic ointment and a bandage was applied. Keep clean, dry, intact. Will likely remove next appointment.  -Continue surgical shoe. -Has bone stimulator; will likely start next appointment- this was ordered orginally due to the fracture healing.  -Ice/elevation -Pain medication as needed. -Monitor for any clinical signs or symptoms of infection and DVT/PE and directed to call the office immediately should any occur or go to the ER. -Follow-up in 1 week for possible suture remove or sooner if any problems arise. In the meantime, encouraged to call the  office with any questions, concerns, change in symptoms.   [name], [name]

## 2017-06-26 ENCOUNTER — Ambulatory Visit: Payer: Self-pay

## 2017-06-28 ENCOUNTER — Telehealth: Payer: Self-pay | Admitting: Podiatry

## 2017-06-28 NOTE — Telephone Encounter (Signed)
Left message informing pt Dr. Jacqualyn Posey will give her the pain medication rx tomorrow at her 10:30am appt after discussing the pain management.

## 2017-06-28 NOTE — Telephone Encounter (Signed)
Dr. Jacqualyn Posey performed toe surgery and I'm out of pain medication. I was calling to see if I could get a refill. I can be there before 5 pm. If someone could call me back ASAP at [phone].

## 2017-06-29 ENCOUNTER — Encounter: Payer: Self-pay | Admitting: Podiatry

## 2017-06-29 ENCOUNTER — Ambulatory Visit (INDEPENDENT_AMBULATORY_CARE_PROVIDER_SITE_OTHER): Payer: Medicaid Other | Admitting: Podiatry

## 2017-06-29 DIAGNOSIS — M2041 Other hammer toe(s) (acquired), right foot: Secondary | ICD-10-CM

## 2017-06-29 MED ORDER — OXYCODONE-ACETAMINOPHEN 5-325 MG PO TABS: 1.0000 | ORAL_TABLET | Freq: Three times a day (TID) | ORAL | 0 refills | Status: DC | PRN

## 2017-07-02 NOTE — Progress Notes (Signed)
Subjective: [name] is a 46 y.o. is seen today in office s/p right 2nd digit hammertoe repair preformed on 06/07/2017. She states that while her pain is improving she soaking pain she is asking for refill of pain medication. She is continued surgical shoe. She states that she has no concerns today she is happy with the toe is straight. She presents today for suture removal. She has no other concerns today. Denies any systemic complaints such as fevers, chills, nausea, vomiting. No calf pain, chest pain, shortness of breath.   Objective: General: No acute distress, AAOx3  DP/PT pulses palpable 2/4, CRT < 3 sec to all digits.  Protective sensation intact. Motor function intact.  Right foot: Incision is well coapted without any evidence of dehiscence and sutures are intact. There is no movement across the incision today. There is no surrounding erythema, ascending cellulitis. There is no fluctuance or crepitus. There is no malodor. There is no clinical signs of infection. The toes in rectus position. There is mild swelling to the toe. K wire intact without any drainage coming from the wire. No other open lesions or pre-ulcerative lesions.  No pain with calf compression, swelling, warmth, erythema.   Assessment and Plan:  Status post right hammertoe repair with MTPJ synovectomy, doing well with no complications   -Treatment options discussed including all alternatives, risks, and complications -Sutures removed today without complications. After removal of the incision remains well coapted. And buttock ointment was applied followed by a bandage. Keep dressing clean, dry, intact. -Remaining surgical shoe at all times. -Conservative stimulator. -Ice and elevation. -Pain medication as needed. I did refill this for her today. Hopefully next appointment and get decreased down to Vicodin. -Monitor for any clinical signs or symptoms of infection and DVT/PE and directed to call the office immediately  should any occur or go to the ER. -Follow-up in 10 days or sooner if any problems arise. In the meantime, encouraged to call the office with any questions, concerns, change in symptoms.   *x-ray next appointment  [name], [name]

## 2017-07-11 ENCOUNTER — Telehealth: Payer: Self-pay | Admitting: Podiatry

## 2017-07-11 MED ORDER — OXYCODONE-ACETAMINOPHEN 5-325 MG PO TABS: 1.0000 | ORAL_TABLET | Freq: Three times a day (TID) | ORAL | 0 refills | Status: DC | PRN

## 2017-07-11 NOTE — Addendum Note (Signed)
Addended by: Harriett Sine D on: 07/11/2017 02:05 PM   Modules accepted: Orders

## 2017-07-11 NOTE — Telephone Encounter (Signed)
I was calling to see if Dr. Jacqualyn Fry could give me something for pain even if its something different. I can feel this wire in my foot and it doesn't feel pleasant. Please call me back at [phone] and if I could get a call back today. Thanks.

## 2017-07-11 NOTE — Telephone Encounter (Signed)
I left a message earlier trying to get a message to Dr. Jacqualyn Posey about some pain medication. Please call me back. Thank you.

## 2017-07-11 NOTE — Telephone Encounter (Signed)
I informed pt Dr. Jacqualyn Posey had refilled the percocet as previously, and would slowly be tapering the dosage. Pt states understanding. Pt was informed she needed to pick up the rx in the [place] office.

## 2017-07-13 ENCOUNTER — Ambulatory Visit (INDEPENDENT_AMBULATORY_CARE_PROVIDER_SITE_OTHER): Payer: Medicaid Other | Admitting: Podiatry

## 2017-07-13 ENCOUNTER — Ambulatory Visit
Admission: RE | Admit: 2017-07-13 | Discharge: 2017-07-13 | Disposition: A | Discharge status: Home / Self Care | Payer: Medicaid Other | Source: Ambulatory Visit | Attending: Family Medicine | Admitting: Family Medicine

## 2017-07-13 ENCOUNTER — Encounter: Payer: Self-pay | Admitting: Podiatry

## 2017-07-13 ENCOUNTER — Ambulatory Visit (INDEPENDENT_AMBULATORY_CARE_PROVIDER_SITE_OTHER): Payer: Medicaid Other

## 2017-07-13 DIAGNOSIS — Z1231 Encounter for screening mammogram for malignant neoplasm of breast: Secondary | ICD-10-CM | POA: Diagnosis not present

## 2017-07-13 DIAGNOSIS — M2041 Other hammer toe(s) (acquired), right foot: Secondary | ICD-10-CM

## 2017-07-16 NOTE — Progress Notes (Signed)
Subjective: [name] is a 46 y.o. is seen today in office s/p right 2nd digit hammertoe repair preformed on 06/07/2017. She states that she is doing well and her pain is much improved. She presents a for pin removal. She denies any recent injury or trauma she has no new concerns. She's remain in the surgical boot. Denies any systemic complaints such as fevers, chills, nausea, vomiting. No calf pain, chest pain, shortness of breath.   Objective: General: No acute distress, AAOx3  DP/PT pulses palpable 2/4, CRT < 3 sec to all digits.  Protective sensation intact. Motor function intact.  Right foot: Incision is well coapted without any evidence of dehiscence and a scar is formed. K wire intact the second toe without any drainage or pus. There is mild swelling to the toe but does appear to be improved compared to what it was even prior to surgery. There is no erythema or increase in warmth. The toe is in a rectus position or slightly dorsiflexed. Decreased tenderness along the surgical site. There is no other area of tenderness identified.  No other open lesions or pre-ulcerative lesions.  No pain with calf compression, swelling, warmth, erythema.   Assessment and Plan:  Status post right hammertoe repair with MTPJ synovectomy, doing well with no complications   -Treatment options discussed including all alternatives, risks, and complications -X-rays were obtained and reviewed. Hardware intact and second toe. No evidence of acute fracture. -At this point the K wire was removed today in total without complications. Off removal the toe remained in a rectus position. Into antibiotic ointment and a bandage was applied on the pin site. She can start to shower tomorrow. Dispensed a splint to help hold the toe in a rectus position and to help plantar flex slightly. -She can start to return to regular shoe as tolerated. -She has not been using the bone stimulator. -Monitor for any clinical signs or  symptoms of infection and directed to call the office immediately should any occur or go to the ER. -Follow-up in 3 weeks or sooner if any problems arise. In the meantime, encouraged to call the office with any questions, concerns, change in symptoms.   *x-ray next appointment  [name], [name]

## 2017-07-17 ENCOUNTER — Telehealth: Payer: Self-pay | Admitting: *Deleted

## 2017-07-17 NOTE — Telephone Encounter (Signed)
-----   Message from Trula Slade, [name] sent at 07/16/2017 10:59 AM EST ----- I see she has been getting percocet. Please no more for her. If she needs pain medication, I am going to lower it. Thanks.

## 2017-07-18 NOTE — Telephone Encounter (Addendum)
-----   Message from Trula Slade, [name] sent at 07/16/2017 10:59 AM EST ----- I see she has been getting percocet. Please no more for her. If she needs pain medication, I am going to lower it. Thanks.

## 2017-07-31 ENCOUNTER — Ambulatory Visit (INDEPENDENT_AMBULATORY_CARE_PROVIDER_SITE_OTHER): Payer: Medicaid Other

## 2017-07-31 ENCOUNTER — Ambulatory Visit (INDEPENDENT_AMBULATORY_CARE_PROVIDER_SITE_OTHER): Payer: Medicaid Other | Admitting: Podiatry

## 2017-07-31 DIAGNOSIS — M2041 Other hammer toe(s) (acquired), right foot: Secondary | ICD-10-CM | POA: Diagnosis not present

## 2017-07-31 DIAGNOSIS — L603 Nail dystrophy: Secondary | ICD-10-CM

## 2017-07-31 DIAGNOSIS — L600 Ingrowing nail: Secondary | ICD-10-CM

## 2017-07-31 MED ORDER — TRAMADOL HCL 50 MG PO TABS: 50.0000 mg | ORAL_TABLET | Freq: Two times a day (BID) | ORAL | 0 refills | Status: DC | PRN

## 2017-07-31 MED ORDER — CEPHALEXIN 500 MG PO CAPS: 500.0000 mg | ORAL_CAPSULE | Freq: Three times a day (TID) | ORAL | 0 refills | Status: DC

## 2017-07-31 NOTE — Patient Instructions (Signed)

## 2017-08-02 NOTE — Progress Notes (Signed)
Subjective: [name] is a 46 y.o. is seen today in office s/p right 2nd digit hammertoe repair preformed on 06/07/2017. She states that she is doing well and her pain is slowly improving.  She has not been using the bone sooner.  She states that she still gets occasional pain when she tries to bend her toe she points to the MPJ.  She has started going back into a regular shoe.  She denies any recent injury or trauma to the right foot.  She also has new concerns today for left big toenail becoming painful and thick.  She denies any redness or drainage or any swelling.  She is discussing total removal of the toenail currently.  She has no other concerns today. Denies any systemic complaints such as fevers, chills, nausea, vomiting. No calf pain, chest pain, shortness of breath.   Objective: General: No acute distress, AAOx3  DP/PT pulses palpable 2/4, CRT < 3 sec to all digits.  Protective sensation intact. Motor function intact.  Right foot: Incision is well coapted without any evidence of dehiscence and a scar is formed.  Second cousins rectus position.  There is discomfort mildly along the second MPJ and there is mild pain with digit range of motion but there is no significant crepitation.  There is no edema to the second MTPJ.  There is no other areas of tenderness on the right foot. On the left foot there is tenderness to the left hallux toenail the nail is hypertrophic, dystrophic with yellow-brown discoloration.  There is no drainage or pus expressed there is incurvation of both in the lateral aspect and significant incurvation present.  No other open lesions or pre-ulcerative lesions are identified bilaterally. No pain with calf compression, swelling, warmth, erythema.   Assessment and Plan:  Status post right hammertoe repair with MTPJ synovectomy with mild continue MPJ pain; left hallux symptomatic onychodystrophy/ingrown toenail  -Treatment options discussed including all alternatives,  risks, and complications -X-rays were obtained and reviewed.  Healing of the fracture of the proximal phalanx of the second toe.  Arthritic changes are present second MPJ.  I reviewed the x-rays with her today. -Regards the right thyroid dispensed a graphite insert for hardware and soft tissues.  Also discussed shoe modifications.  In the future will consider steroid injection if symptoms continue but will hold off on this for now.  -At this time, the patient is requesting total nail removal with chemical matricectomy to the symptomatic portion of the nail. Risks and complications were discussed with the patient for which they understand and written consent was obtained. Under sterile conditions a total of 3 mL of a mixture of 2% lidocaine plain and 0.5% Marcaine plain was infiltrated in a hallux block fashion. Once anesthetized, the skin was prepped in sterile fashion. A tourniquet was then applied. Next the left hallux nail was then sharply excised making sure to remove the entire offending nail border. Once the nails were ensured to be removed area was debrided and the underlying skin was intact. There is no purulence identified in the procedure. Next phenol was then applied under standard conditions and copiously irrigated. Silvadene was applied. A dry sterile dressing was applied. After application of the dressing the tourniquet was removed and there is found to be an immediate capillary refill time to the digit. The patient tolerated the procedure well any complications. Post procedure instructions were discussed the patient for which he verbally understood. Follow-up in one week for nail check or sooner  if any problems are to arise. Discussed signs/symptoms of infection and directed to call the office immediately should any occur or go directly to the emergency room. In the meantime, encouraged to call the office with any questions, concerns, changes symptoms.  [name], [name] -At this point the K  wire was removed today in total without complications. Off removal the toe remained in a rectus position. Into antibiotic ointment and a bandage was applied on the pin site. She can start to shower tomorrow. Dispensed a splint to help hold the toe in a rectus position and to help plantar flex slightly. -She can start to return to regular shoe as tolerated. -She has not been using the bone stimulator. -Monitor for any clinical signs or symptoms of infection and directed to call the office immediately should any occur or go to the ER. -Follow-up in 3 weeks or sooner if any problems arise. In the meantime, encouraged to call the office with any questions, concerns, change in symptoms.   *x-ray next appointment  [name], [name]

## 2017-08-08 ENCOUNTER — Encounter: Payer: Self-pay | Admitting: Podiatry

## 2017-08-08 ENCOUNTER — Telehealth: Payer: Self-pay | Admitting: *Deleted

## 2017-08-08 NOTE — Telephone Encounter (Signed)
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

## 2017-08-08 NOTE — Telephone Encounter (Signed)
Pt states she needs a note to go back to work.

## 2017-08-13 ENCOUNTER — Other Ambulatory Visit: Payer: Self-pay | Admitting: Family Medicine

## 2017-08-13 DIAGNOSIS — G6289 Other specified polyneuropathies: Secondary | ICD-10-CM

## 2017-08-14 ENCOUNTER — Telehealth: Payer: Self-pay

## 2017-08-14 ENCOUNTER — Ambulatory Visit: Payer: Medicaid Other | Admitting: Podiatry

## 2017-08-14 ENCOUNTER — Other Ambulatory Visit: Payer: Self-pay | Admitting: Family Medicine

## 2017-08-14 DIAGNOSIS — G6289 Other specified polyneuropathies: Secondary | ICD-10-CM

## 2017-08-14 NOTE — Telephone Encounter (Signed)
Pt was called and informed of lyrica script being ready for pick up.

## 2017-08-15 ENCOUNTER — Ambulatory Visit (INDEPENDENT_AMBULATORY_CARE_PROVIDER_SITE_OTHER): Payer: Medicaid Other

## 2017-08-15 DIAGNOSIS — E538 Deficiency of other specified B group vitamins: Secondary | ICD-10-CM

## 2017-08-16 MED ORDER — CYANOCOBALAMIN 1000 MCG/ML IJ SOLN
1000.0000 ug | Freq: Once | INTRAMUSCULAR | Status: AC
  Administered 2017-08-15: 1000 ug via INTRAMUSCULAR

## 2017-08-23 ENCOUNTER — Ambulatory Visit: Payer: Self-pay | Admitting: Family Medicine

## 2017-08-24 ENCOUNTER — Ambulatory Visit (INDEPENDENT_AMBULATORY_CARE_PROVIDER_SITE_OTHER): Payer: Medicaid Other

## 2017-08-24 ENCOUNTER — Ambulatory Visit (INDEPENDENT_AMBULATORY_CARE_PROVIDER_SITE_OTHER): Payer: Medicaid Other | Admitting: Podiatry

## 2017-08-24 DIAGNOSIS — L603 Nail dystrophy: Secondary | ICD-10-CM

## 2017-08-24 DIAGNOSIS — M2041 Other hammer toe(s) (acquired), right foot: Secondary | ICD-10-CM

## 2017-08-25 ENCOUNTER — Telehealth: Payer: Self-pay | Admitting: Podiatry

## 2017-08-25 NOTE — Telephone Encounter (Signed)
Patient is to pick up stabilizer insert for shoe. No cost per Dr. Jacqualyn Posey. Left voicemail telling patient insert is ready for pick-up at the [facility] office.

## 2017-08-31 NOTE — Progress Notes (Signed)
Subjective: [name] is a 46 y.o. is seen today in office s/p right 2nd digit hammertoe repair preformed on 06/07/2017.  She also presents today for follow-up evaluation of left hallux toenail removal.  She said the left side is doing well she not having any issues.  She states the swelling to the right second toe is coming down she still gets some discomfort when she tries to bend her toe although it is slowly improving.  She states that she is going to take some time to see how she does.  No recent injury or trauma since last appointment.  No redness or warmth.  She is no other concerns today. Denies any systemic complaints such as fevers, chills, nausea, vomiting. No calf pain, chest pain, shortness of breath.   Objective: General: No acute distress, AAOx3  DP/PT pulses palpable 2/4, CRT < 3 sec to all digits.  Protective sensation intact. Motor function intact.  Right foot: Incision is well coapted without any evidence of dehiscence and a scar is formed.  The second toe is in rectus position and there is mild swelling to the toe but overall appears to be improved.  There is no erythema or increase in warmth.  Second MPJ range of motion intact however there is some mild discomfort with MPJ range of motion.  There is no crepitation of the joint.  No other areas of tenderness identified to the right foot. Left foot: Status post total nail avulsion of the left hallux which is healing well.  There is no drainage or pus there is no redness or swelling.  There is no ascending cellulitis.  No fluctuation or crepitation.  No malodor.  No signs of infection. No other open lesions or pre-ulcerative lesions are identified bilaterally. No pain with calf compression, swelling, warmth, erythema.   Assessment and Plan:  Status post right hammertoe repair with MTPJ synovectomy with second MPJ posttraumatic arthritis; status post total nail avulsion left hallux  -Treatment options discussed including all  alternatives, risks, and complications -X-rays were obtained and reviewed.  Healing of the fracture of the proximal phalanx of the second toe.  Arthritic changes are present second MPJ.  I reviewed the x-rays with her today -Continue graphite insert to the right foot.  She is wearing a regular shoe.  I discussed with her the arthritic changes present of the MPJ and she is clinically and get another surgery in the future but we will hold off on this for now and see how she does.  We can consider steroid in the future if symptoms continue.  For now we will discharge her for the postoperative care knowing that there are still issues of the second MPJ will give it a couple months to see how she is doing.  I will see her back in February or March or sooner if any issues are to arise. -Left hallux toenail doing well without signs of infection.  Trula Slade [name]

## 2017-09-11 ENCOUNTER — Other Ambulatory Visit (INDEPENDENT_AMBULATORY_CARE_PROVIDER_SITE_OTHER): Payer: Medicaid Other

## 2017-09-11 ENCOUNTER — Encounter: Payer: Self-pay | Admitting: Neurology

## 2017-09-11 ENCOUNTER — Ambulatory Visit: Payer: Medicaid Other | Admitting: Neurology

## 2017-09-11 VITALS — BP 110/80 | HR 78 | Wt 147.5 lb

## 2017-09-11 DIAGNOSIS — G6289 Other specified polyneuropathies: Secondary | ICD-10-CM

## 2017-09-11 DIAGNOSIS — Z720 Tobacco use: Secondary | ICD-10-CM | POA: Diagnosis not present

## 2017-09-11 DIAGNOSIS — E538 Deficiency of other specified B group vitamins: Secondary | ICD-10-CM

## 2017-09-11 DIAGNOSIS — G589 Mononeuropathy, unspecified: Secondary | ICD-10-CM

## 2017-09-11 DIAGNOSIS — E5111 Dry beriberi: Secondary | ICD-10-CM

## 2017-09-11 LAB — VITAMIN B12: Vitamin B-12: 1042 pg/mL — ABNORMAL HIGH (ref 211–911)

## 2017-09-11 LAB — FOLATE: Folate: 23.6 ng/mL (ref >5.9)

## 2017-09-11 MED ORDER — PREGABALIN 100 MG PO CAPS: 100.0000 mg | ORAL_CAPSULE | Freq: Two times a day (BID) | ORAL | 5 refills | Status: DC

## 2017-09-11 NOTE — Patient Instructions (Addendum)
Start Lyrica 100mg  twice daily  Check labs  Please try to quit smoking   Return to clinic in 6 months

## 2017-09-11 NOTE — Progress Notes (Signed)
Follow-up Visit   Date: 09/11/17    [name] MRN: [id number] DOB: 08-23-1971   Interim History: [name] is a 46 y.o. 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  The patient was accompanied to the clinic by boyfriend who also provides collateral information.    History of present illness: She started having tingling and weakness of the legs in 11-22-2011 and eventually sought medical attention in 11-21-13 because of progressive worsening pain and weakness of the legs and malnutrition.  She was hospitalized and had extensive neurological work-up including CSF analysis, serology testing, and sural nerve biopsy.  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   She was diagnosed with alcoholic and nutritional deficiency neuropathy and was seeing Dr. [name] at [facility] for follow-up.  Her husband passed away in 11-22-2011 and she became depressed, which led to her drinking alcohol.  She has been sober since this time.    She noticed gradual improvement over the following year where she was able to walk again.  She continues stumble often because of lack of sensation and imbalance, but walks unassisted.  She has tingling and burning sensation involving the lower legs, especially from the knee down and the fingertips.  She was prescribed gabapentin '600mg'$  TID, previously she was taking '600mg'$  fives times per day.  She did not take the gabapentin as prescribed and was taking gabapentin '600mg'$  at 7am and  2pm, and '1200mg'$  at 10pm. She also takes Cymbalta '60mg'$  twice daily.  She has been noncompliant with multivitamin and folic acid.  She was seeing Dr. [name] at [facility] since 11/21/13 and was discharged in 2015-11-22 due to too many no shows.   She quit drinking alcohol in Nov 22, 2014.  She was consuming 7-8 beers daily x 3  years and has been sober since then.  UPDATE 09/11/2017:  She is here for follow-up visit.  At her last visit, she was found to have vitamin B1, vitamin B12, and folate deficiency and started on supplementation.  She is compliant with vitamin B12, but does not take folic acid or thiamine.  Her boyfriend states that she is eating much better and has gained 20lb.  She has started to drink socially, about twice per month.  She had one fall and fractured her right 2nd toe, requiring right foot surgery in September.  She has recovered well from this.  Regarding her neuropathy, she continues to have imbalance and burning pain of the lower legs and feet.  PCP switched her gabapentin to Lyrica '75mg'$  in September and would like the dose increased a little.    Medications:  Current Outpatient Medications on File Prior to Visit  Medication Sig Dispense Refill  . cephALEXin (KEFLEX) 500 MG capsule Take 1 capsule (500 mg total) 3 (three) times daily by mouth. 30 capsule 0  . Cyanocobalamin (VITAMIN B-12 IJ) Inject as directed once a week.     . DULoxetine (CYMBALTA) 60 MG capsule TAKE 1 CAPSULE BY MOUTH 2 TIMES DAILY. 60 capsule 6  . folic acid (FOLVITE) 1 MG tablet Take 1 tablet (1 mg total) by mouth daily.    . hydrALAZINE (APRESOLINE) 10 MG tablet Take 1 tablet (10 mg total) by mouth 3 (three) times daily. 90 tablet 5  . LYRICA 75 MG capsule TAKE 1 CAPSULE TWICE DAILY 60 capsule 1  . metroNIDAZOLE (METROGEL VAGINAL)  0.75 % vaginal gel Place 1 Applicatorful vaginally at bedtime. 70 g 0  . mirtazapine (REMERON) 15 MG tablet Take 1 tablet (15 mg total) by mouth at bedtime. For weight loss and insomnia 30 tablet 5  . Multiple Vitamins-Minerals (MULTIVITAMIN WITH MINERALS) tablet Take 1 tablet by mouth daily.    Marland Kitchen omeprazole (PRILOSEC) 20 MG capsule Take 1 capsule (20 mg total) by mouth daily. 30 capsule 5  . oxyCODONE-acetaminophen (ROXICET) 5-325 MG tablet Take 1 tablet by mouth every 8 (eight) hours as needed  for severe pain. 20 tablet 0  . potassium chloride (K-DUR,KLOR-CON) 10 MEQ tablet Take 10 mEq by mouth daily.    . promethazine (PHENERGAN) 25 MG tablet Take 1 tablet (25 mg total) by mouth every 8 (eight) hours as needed for nausea or vomiting. 20 tablet 0  . traMADol (ULTRAM) 50 MG tablet Take 1 tablet (50 mg total) every 12 (twelve) hours as needed by mouth. 10 tablet 0   No current facility-administered medications on file prior to visit.     Allergies:  Allergies  Allergen Reactions  . Macrobid [Nitrofurantoin Macrocrystal] Anaphylaxis and Other (See Comments)    Patient reports 2 syncope episodes after her mouth became numb    Review of Systems:  CONSTITUTIONAL: No fevers, chills, night sweats, or weight loss.  EYES: No visual changes or eye pain ENT: No hearing changes.  No history of nose bleeds.   RESPIRATORY: No cough, wheezing and shortness of breath.   CARDIOVASCULAR: Negative for chest pain, and palpitations.   GI: Negative for abdominal discomfort, blood in stools or black stools.  No recent change in bowel habits.   GU:  No history of incontinence.   MUSCLOSKELETAL: +history of joint pain or swelling.  No myalgias.   SKIN: Negative for lesions, rash, and itching.   ENDOCRINE: Negative for cold or heat intolerance, polydipsia or goiter.   PSYCH:  No depression or anxiety symptoms.   NEURO: As Above.   Vital Signs:  BP 110/80   Pulse 78   Wt 147 lb 8 oz (66.9 kg)   SpO2 99%   BMI 24.55 kg/m    General: Well-appearing, comfortable Neck: No carotid bruit CV: RRR Ext: No edema  Neurological Exam: MENTAL STATUS including orientation to time, place, person, recent and remote memory, attention span and concentration, language, and fund of knowledge is normal.  Speech is not dysarthric.  CRANIAL NERVES: No visual field defects.  Pupils equal round and reactive to light.  Normal conjugate, extra-ocular eye movements in all directions of gaze.  No ptosis.  Face is  symmetric. Palate elevates symmetrically.  Tongue is midline.  MOTOR:  Motor strength is 5/5 in all extremities.  No atrophy, fasciculations or abnormal movements.  No pronator drift.  Tone is normal.    MSRs:  Right                                                                 Left brachioradialis 2+  brachioradialis 2+  biceps 2+  biceps 2+  triceps 2+  triceps 2+  patellar 0  Patellar tr  ankle jerk 0  ankle jerk 0   SENSORY:  Intact to vibration is intact at the hands, reduced at the right knee, absent on  left knee and distal to ankles.  Pin prick and temperature is reduced distal to knees bilaterally.  Marland Kitchen  COORDINATION/GAIT:  Normal finger-to- nose-finger.  Intact rapid alternating movements bilaterally.  Gait narrow based and stable.   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 EMG evaluation of the right lower extremity was relatively unremarkable. Labs 12/2013:  ANA negative. RPR nonreactive, HIV nonreactive, ESR only at 5, rheumatoid factor negative, copper 127, Vitamin B12 at 381. Heavy metal screen is neg, SPEP no M protein CSF 12/24/2013:  CSF R1 W0 G60 P34  Labs 11/16/2016:  Vitamin B12 119*, vitamin B1 < 7, MMA 216, folate 2.2*  IMPRESSION/PLAN: 1.  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  She was recently switched to Lyrica 14m BID and tapered off gabapentin.  Because she continues to have pain, we can increase this to Lyrica 1038mBID.    2.  Nutritional deficiencies.  Continue vitamin B12 100016minjections monthly for now.  If levels are increasing, consider transitioning to oral supplementation.  She is noncompliant with folate and thiamine supplementation, but has gained 20+lb since her last visit and is eating much  better.   Check vitamin B12, vitamin B1, and folate  3.  Tobacco abuse.  Currently smoking 0.5 packs/day.  Patient was informed of the dangers of tobacco abuse including stroke, cancer, and MI, as well as benefits of tobacco cessation.  She is interested in cutting back. Approximately 4 mins were spent counseling patient cessation techniques. We discussed various methods to help quit smoking, including deciding on a date to quit, joining a support group, pharmacological agents- nicotine gum/patch/lozenges, chantix.  I will reassess her progress at next follow-up visit.  Thank you for allowing me to participate in patient's care.  If I can answer any additional questions, I would be pleased to do so.    Sincerely,    Donika K. PatPosey ProntoO

## 2017-09-14 ENCOUNTER — Ambulatory Visit (INDEPENDENT_AMBULATORY_CARE_PROVIDER_SITE_OTHER): Payer: Medicaid Other | Admitting: *Deleted

## 2017-09-14 DIAGNOSIS — E538 Deficiency of other specified B group vitamins: Secondary | ICD-10-CM | POA: Diagnosis not present

## 2017-09-14 MED ORDER — CYANOCOBALAMIN 1000 MCG/ML IJ SOLN
1000.0000 ug | Freq: Once | INTRAMUSCULAR | Status: AC
  Administered 2017-09-14: 1000 ug via INTRAMUSCULAR

## 2017-09-15 ENCOUNTER — Telehealth: Payer: Self-pay | Admitting: *Deleted

## 2017-09-15 LAB — VITAMIN B1: Vitamin B1 (Thiamine): 7 nmol/L — ABNORMAL LOW (ref 8–30)

## 2017-09-15 NOTE — Telephone Encounter (Signed)
-----   Message from Alda Berthold, DO sent at 09/15/2017 10:55 AM EST ----- Please inform pt that her vitamin B1 (thaimine) is still very low - start OTV vitamin B1 100mg /d.  Folate and B12 are within normal limits - ok to transition to OTC B12 1029mcg daily and stop injections. Thanks.

## 2017-09-15 NOTE — Telephone Encounter (Signed)
-----   Message from Alda Berthold, DO sent at 09/15/2017 10:55 AM EST ----- Please inform pt that her vitamin B1 (thaimine) is still very low - start OTV vitamin B1 100mg /d.  Folate and B12 are within normal limits - ok to transition to OTC B12 1041mcg daily and stop injections. Thanks.

## 2017-09-15 NOTE — Telephone Encounter (Signed)
Left message giving patient results and instructions.   

## 2017-09-18 ENCOUNTER — Ambulatory Visit: Payer: Medicaid Other | Admitting: Family Medicine

## 2017-10-02 ENCOUNTER — Ambulatory Visit: Payer: Medicaid Other | Attending: Family Medicine | Admitting: Family Medicine

## 2017-10-02 ENCOUNTER — Encounter: Payer: Self-pay | Admitting: Family Medicine

## 2017-10-02 VITALS — BP 155/87 | HR 87 | Temp 97.5°F | Ht 65.0 in | Wt 151.4 lb

## 2017-10-02 DIAGNOSIS — K219 Gastro-esophageal reflux disease without esophagitis: Secondary | ICD-10-CM | POA: Diagnosis not present

## 2017-10-02 DIAGNOSIS — M792 Neuralgia and neuritis, unspecified: Secondary | ICD-10-CM

## 2017-10-02 DIAGNOSIS — G629 Polyneuropathy, unspecified: Secondary | ICD-10-CM

## 2017-10-02 DIAGNOSIS — H53009 Unspecified amblyopia, unspecified eye: Secondary | ICD-10-CM | POA: Diagnosis not present

## 2017-10-02 DIAGNOSIS — G4709 Other insomnia: Secondary | ICD-10-CM | POA: Diagnosis not present

## 2017-10-02 DIAGNOSIS — I10 Essential (primary) hypertension: Secondary | ICD-10-CM

## 2017-10-02 DIAGNOSIS — G6289 Other specified polyneuropathies: Secondary | ICD-10-CM | POA: Diagnosis not present

## 2017-10-02 DIAGNOSIS — Z79899 Other long term (current) drug therapy: Secondary | ICD-10-CM | POA: Insufficient documentation

## 2017-10-02 DIAGNOSIS — G4700 Insomnia, unspecified: Secondary | ICD-10-CM | POA: Insufficient documentation

## 2017-10-02 DIAGNOSIS — G63 Polyneuropathy in diseases classified elsewhere: Secondary | ICD-10-CM | POA: Insufficient documentation

## 2017-10-02 DIAGNOSIS — E639 Nutritional deficiency, unspecified: Secondary | ICD-10-CM | POA: Insufficient documentation

## 2017-10-02 DIAGNOSIS — L308 Other specified dermatitis: Secondary | ICD-10-CM

## 2017-10-02 MED ORDER — TRAZODONE HCL 100 MG PO TABS: 100.0000 mg | ORAL_TABLET | Freq: Every evening | ORAL | 5 refills | Status: DC | PRN

## 2017-10-02 MED ORDER — AMLODIPINE BESYLATE 5 MG PO TABS: 5.0000 mg | ORAL_TABLET | Freq: Every day | ORAL | 5 refills | Status: DC

## 2017-10-02 MED ORDER — OMEPRAZOLE 20 MG PO CPDR: 20.0000 mg | DELAYED_RELEASE_CAPSULE | Freq: Every day | ORAL | 5 refills | Status: DC

## 2017-10-02 MED ORDER — HYDRALAZINE HCL 10 MG PO TABS: 10.0000 mg | ORAL_TABLET | Freq: Three times a day (TID) | ORAL | 5 refills | Status: DC

## 2017-10-02 MED ORDER — DULOXETINE HCL 60 MG PO CPEP: ORAL_CAPSULE | ORAL | 5 refills | Status: DC

## 2017-10-02 NOTE — Patient Instructions (Signed)

## 2017-10-02 NOTE — Progress Notes (Signed)
Subjective:  Patient ID: [name], female    DOB: 01/14/1971  Age: 47 y.o. 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  Her reflux symptoms are under control and she endorses taking her omeprazole in the middle of the day; she does not eat late meals. Denies nausea, vomiting or abdominal pain.  Her blood pressure is elevated and she endorses compliance with her antihypertensive. She has gained 30 pounds in the last 7 months which she attributes to being sedentary after her foot surgery. Her insomnia is controlled on Remeron.  She was seen by Maryanna Shape neurology last month at which time she had an increase in dose of her Lyrica from 75 mg to 100 mg due to uncontrolled peripheral neuropathy and informs me her symptoms are still present. She was previously on vitamin B12 injections which were discontinued once her levels normalized and she has been on OTC vitamin B12 supplements and also OTC vitamin B1 supplements due to vitamin B1 deficiency.   Past Medical History:  Diagnosis Date  . History of alcoholism ([place])   . Hypertension   . Malnutrition ([place])   . Peripheral neuropathy   . Strachan's syndrome     Past Surgical History:  Procedure Laterality Date  . MULTIPLE EXTRACTIONS WITH ALVEOLOPLASTY N/A 12/25/2013   Procedure: Extraction of tooth #'s 2,3,4,5,6,8,9,10,11,12,13,14,15,20,21,22,23,24,25,26,27, [phone] with alveoloplasty and bilateral mandibular tori reductions;  Surgeon: Lenn Cal, DDS;  Location: [facility];  Service: Oral Surgery;  Laterality: N/A;  . SURAL NERVE BX Right 12/24/2013   Procedure: SURAL NERVE BIOPSY;  Surgeon: Kristeen Miss, MD;  Location: [facility];  Service: Neurosurgery;  Laterality: Right;  . TUBAL LIGATION      Allergies  Allergen  Reactions  . Macrobid [Nitrofurantoin Macrocrystal] Anaphylaxis and Other (See Comments)    Patient reports 2 syncope episodes after her mouth became numb     Outpatient Medications Prior to Visit  Medication Sig Dispense Refill  . folic acid (FOLVITE) 1 MG tablet Take 1 tablet (1 mg total) by mouth daily.    . Multiple Vitamins-Minerals (MULTIVITAMIN WITH MINERALS) tablet Take 1 tablet by mouth daily.    . potassium chloride (K-DUR,KLOR-CON) 10 MEQ tablet Take 10 mEq by mouth daily.    . pregabalin (LYRICA) 100 MG capsule Take 1 capsule (100 mg total) by mouth 2 (two) times daily. 60 capsule 5  . promethazine (PHENERGAN) 25 MG tablet Take 1 tablet (25 mg total) by mouth every 8 (eight) hours as needed for nausea or vomiting. 20 tablet 0  . DULoxetine (CYMBALTA) 60 MG capsule TAKE 1 CAPSULE BY MOUTH 2 TIMES DAILY. 60 capsule 6  . hydrALAZINE (APRESOLINE) 10 MG tablet Take 1 tablet (10 mg total) by mouth 3 (three) times daily. 90 tablet 5  . mirtazapine (REMERON) 15 MG tablet Take 1 tablet (15 mg total) by mouth at bedtime. For weight loss and insomnia 30 tablet 5  . omeprazole (PRILOSEC) 20 MG capsule Take 1 capsule (20 mg total) by mouth daily. 30 capsule 5  . Cyanocobalamin (VITAMIN B-12 IJ) Inject as directed once a week.     Marland Kitchen oxyCODONE-acetaminophen (ROXICET) 5-325 MG tablet Take 1 tablet by mouth every 8 (eight) hours as needed for severe pain. (Patient not taking: Reported on 10/02/2017) 20 tablet 0  .  cephALEXin (KEFLEX) 500 MG capsule Take 1 capsule (500 mg total) 3 (three) times daily by mouth. (Patient not taking: Reported on 10/02/2017) 30 capsule 0  . metroNIDAZOLE (METROGEL VAGINAL) 0.75 % vaginal gel Place 1 Applicatorful vaginally at bedtime. (Patient not taking: Reported on 10/02/2017) 70 g 0  . traMADol (ULTRAM) 50 MG tablet Take 1 tablet (50 mg total) every 12 (twelve) hours as needed by mouth. (Patient not taking: Reported on 10/02/2017) 10 tablet 0   No facility-administered  medications prior to visit.     ROS Review of Systems  Constitutional: Negative for activity change, appetite change and fatigue.  HENT: Negative for congestion, sinus pressure and sore throat.   Eyes: Negative for visual disturbance.  Respiratory: Negative for cough, chest tightness, shortness of breath and wheezing.   Cardiovascular: Negative for chest pain and palpitations.  Gastrointestinal: Negative for abdominal distention, abdominal pain and constipation.  Endocrine: Negative for polydipsia.  Genitourinary: Negative for dysuria and frequency.  Musculoskeletal: Negative for arthralgias and back pain.  Skin: Negative for rash.  Neurological: Positive for numbness. Negative for tremors and light-headedness.  Hematological: Does not bruise/bleed easily.  Psychiatric/Behavioral: Negative for agitation and behavioral problems.    Objective:  BP (!) 155/87   Pulse 87   Temp (!) 97.5 F (36.4 C) (Oral)   Ht 5\' 5"  (1.651 m)   Wt 151 lb 6.4 oz (68.7 kg)   LMP 09/25/2017   SpO2 96%   BMI 25.19 kg/m   BP/Weight 10/02/2017 09/11/2017 40/05/8118  Systolic BP 147 829 562  Diastolic BP 87 80 87  Wt. (Lbs) 151.4 147.5 -  BMI 25.19 24.55 -      Physical Exam  Constitutional: She is oriented to person, place, and time. She appears well-developed and well-nourished.  Cardiovascular: Normal rate, normal heart sounds and intact distal pulses.  No murmur heard. Pulmonary/Chest: Effort normal and breath sounds normal. She has no wheezes. She has no rales. She exhibits no tenderness.  Abdominal: Soft. Bowel sounds are normal. She exhibits no distension and no mass. There is no tenderness.  Musculoskeletal: Normal range of motion.  Neurological: She is alert and oriented to person, place, and time.  Skin: Skin is warm and dry.  Psychiatric: She has a normal mood and affect.     Assessment & Plan:   1. Other polyneuropathy Secondary to nutritional deficiency Uncontrolled on  current dose of Lyrica and Cymbalta She is also on vitamin B1 and B12 supplements over-the-counter Lyrica dose was recently increased by neurology in 08/2017 so we will hold off on any dose increase at this time Keep appointment with neurology - DULoxetine (CYMBALTA) 60 MG capsule; TAKE 1 CAPSULE BY MOUTH 2 TIMES DAILY.  Dispense: 60 capsule; Refill: 5  2. Essential hypertension Uncontrolled, could be secondary to recent weight gain Amlodipine added to regimen Counseled on blood pressure goal of less than 130/80, low-sodium, DASH diet, medication compliance, 150 minutes of moderate intensity exercise per week. Discussed medication compliance, adverse effects. - hydrALAZINE (APRESOLINE) 10 MG tablet; Take 1 tablet (10 mg total) by mouth 3 (three) times daily.  Dispense: 90 tablet; Refill: 5 - amLODipine (NORVASC) 5 MG tablet; Take 1 tablet (5 mg total) by mouth daily.  Dispense: 30 tablet; Refill: 5  3. Gastroesophageal reflux disease, esophagitis presence not specified Uncontrolled Advised to take PPI first thing in the morning and if symptoms persist will increase dose of omeprazole - omeprazole (PRILOSEC) 20 MG capsule; Take 1 capsule (20 mg total) by  mouth daily.  Dispense: 30 capsule; Refill: 5  4. Other insomnia Controlled on Remeron however due to significant weight gain I will switch her to trazodone - traZODone (DESYREL) 100 MG tablet; Take 1 tablet (100 mg total) by mouth at bedtime as needed for sleep.  Dispense: 30 tablet; Refill: 5  5. Strachan's syndrome Stable   Meds ordered this encounter  Medications  . DULoxetine (CYMBALTA) 60 MG capsule    Sig: TAKE 1 CAPSULE BY MOUTH 2 TIMES DAILY.    Dispense:  60 capsule    Refill:  5  . hydrALAZINE (APRESOLINE) 10 MG tablet    Sig: Take 1 tablet (10 mg total) by mouth 3 (three) times daily.    Dispense:  90 tablet    Refill:  5  . amLODipine (NORVASC) 5 MG tablet    Sig: Take 1 tablet (5 mg total) by mouth daily.     Dispense:  30 tablet    Refill:  5  . traZODone (DESYREL) 100 MG tablet    Sig: Take 1 tablet (100 mg total) by mouth at bedtime as needed for sleep.    Dispense:  30 tablet    Refill:  5    Discontinue Remeron  . omeprazole (PRILOSEC) 20 MG capsule    Sig: Take 1 capsule (20 mg total) by mouth daily.    Dispense:  30 capsule    Refill:  5    Follow-up: Return in about 3 months (around 12/31/2017) for Follow up of chronic medical conditions.   Arnoldo Morale MD

## 2017-11-02 ENCOUNTER — Encounter: Payer: Self-pay | Admitting: Podiatry

## 2017-11-02 ENCOUNTER — Ambulatory Visit (INDEPENDENT_AMBULATORY_CARE_PROVIDER_SITE_OTHER): Payer: Medicaid Other

## 2017-11-02 ENCOUNTER — Ambulatory Visit (INDEPENDENT_AMBULATORY_CARE_PROVIDER_SITE_OTHER): Payer: Medicaid Other | Admitting: Podiatry

## 2017-11-02 DIAGNOSIS — M19071 Primary osteoarthritis, right ankle and foot: Secondary | ICD-10-CM

## 2017-11-02 DIAGNOSIS — M2041 Other hammer toe(s) (acquired), right foot: Secondary | ICD-10-CM | POA: Diagnosis not present

## 2017-11-04 ENCOUNTER — Other Ambulatory Visit: Payer: Self-pay | Admitting: Family Medicine

## 2017-11-05 NOTE — Progress Notes (Signed)
Subjective: [name] presents the office today for follow-up evaluation of right second toe pain.  She says the toe itself is doing much better but she gets some occasional pain in the joints still.  She states that it is worse after she is been on her feet or standing for some time but she denies any recent injury or trauma or any swelling increase.  She states the toe is actually much improved compared to what it was prior to the surgery is in regards to the swelling.  Pain is intermittent and not continuous.  She has no other concerns today. Denies any systemic complaints such as fevers, chills, nausea, vomiting. No acute changes since last appointment, and no other complaints at this time.   Objective: AAO x3, NAD DP/PT pulses palpable bilaterally, CRT less than 3 seconds Mild swelling to the right second toe but there is no discomfort of the toe of the toes and rectus position.  There is minimal discomfort second MTPJ.  There is no crepitation with MPJ range of motion but there is mild restriction.  There is no edema to the MPJ and there is no surrounding erythema or increase in warmth.  There is no other area tenderness identified at this time. No open lesions or pre-ulcerative lesions.  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  -X-rays were obtained and reviewed with the patient.  Evidence of healing to the fracture of the proximal phalanx.  There is arthritic changes present the second MTPJ. -We discussed further surgical intervention as well as conservative treatment.  Her pain is intermittent on a continual basis and she can wear regular shoe which she presents in today.  We discussed a steroid injection but she is having minimal pain today.  She is going back to work in March as she works for the [employer].  Discussed that she has any increased pain or swelling we can consider steroid injection but can hold off on that for now but consider this in the future should she have any increasing pain or swelling.  Also discussed with her further surgical intervention to try to save the joint.  At this point her pain is intermittent so we will hold off on this.  In the future she will likely need joint replacement or other surgical intervention. -Patient encouraged to call the office with any questions, concerns, change in symptoms.   Trula Slade [name]

## 2018-01-01 ENCOUNTER — Ambulatory Visit: Payer: Medicaid Other | Attending: Family Medicine | Admitting: Family Medicine

## 2018-01-01 ENCOUNTER — Encounter: Payer: Self-pay | Admitting: Family Medicine

## 2018-01-01 VITALS — BP 127/80 | HR 74 | Temp 97.8°F | Wt 144.6 lb

## 2018-01-01 DIAGNOSIS — G629 Polyneuropathy, unspecified: Secondary | ICD-10-CM | POA: Diagnosis not present

## 2018-01-01 DIAGNOSIS — M792 Neuralgia and neuritis, unspecified: Secondary | ICD-10-CM | POA: Diagnosis not present

## 2018-01-01 DIAGNOSIS — G6289 Other specified polyneuropathies: Secondary | ICD-10-CM | POA: Diagnosis not present

## 2018-01-01 DIAGNOSIS — M7918 Myalgia, other site: Secondary | ICD-10-CM | POA: Diagnosis not present

## 2018-01-01 DIAGNOSIS — F1021 Alcohol dependence, in remission: Secondary | ICD-10-CM | POA: Diagnosis not present

## 2018-01-01 DIAGNOSIS — L308 Other specified dermatitis: Secondary | ICD-10-CM | POA: Diagnosis not present

## 2018-01-01 DIAGNOSIS — I10 Essential (primary) hypertension: Secondary | ICD-10-CM

## 2018-01-01 DIAGNOSIS — K219 Gastro-esophageal reflux disease without esophagitis: Secondary | ICD-10-CM

## 2018-01-01 DIAGNOSIS — Z79899 Other long term (current) drug therapy: Secondary | ICD-10-CM | POA: Insufficient documentation

## 2018-01-01 DIAGNOSIS — H53009 Unspecified amblyopia, unspecified eye: Secondary | ICD-10-CM | POA: Diagnosis not present

## 2018-01-01 DIAGNOSIS — G4709 Other insomnia: Secondary | ICD-10-CM

## 2018-01-01 DIAGNOSIS — M19071 Primary osteoarthritis, right ankle and foot: Secondary | ICD-10-CM | POA: Diagnosis not present

## 2018-01-01 DIAGNOSIS — Z881 Allergy status to other antibiotic agents status: Secondary | ICD-10-CM | POA: Diagnosis not present

## 2018-01-01 MED ORDER — AMLODIPINE BESYLATE 5 MG PO TABS: 5.0000 mg | ORAL_TABLET | Freq: Every day | ORAL | 6 refills | Status: DC

## 2018-01-01 MED ORDER — MELOXICAM 7.5 MG PO TABS
7.5000 mg | ORAL_TABLET | Freq: Every day | ORAL | 1 refills | Status: DC
Start: 2018-01-01 — End: 2019-07-02

## 2018-01-01 MED ORDER — METHOCARBAMOL 500 MG PO TABS: 500.0000 mg | ORAL_TABLET | Freq: Two times a day (BID) | ORAL | 1 refills | Status: DC | PRN

## 2018-01-01 MED ORDER — DULOXETINE HCL 60 MG PO CPEP: ORAL_CAPSULE | ORAL | 6 refills | Status: DC

## 2018-01-01 MED ORDER — OMEPRAZOLE 20 MG PO CPDR: 20.0000 mg | DELAYED_RELEASE_CAPSULE | Freq: Every day | ORAL | 6 refills | Status: DC

## 2018-01-01 MED ORDER — TRAZODONE HCL 100 MG PO TABS: 100.0000 mg | ORAL_TABLET | Freq: Every evening | ORAL | 6 refills | Status: DC | PRN

## 2018-01-01 MED ORDER — HYDRALAZINE HCL 10 MG PO TABS: 10.0000 mg | ORAL_TABLET | Freq: Three times a day (TID) | ORAL | 6 refills | Status: DC

## 2018-01-01 NOTE — Patient Instructions (Signed)

## 2018-01-01 NOTE — Progress Notes (Signed)
Subjective:  Patient ID: [name], female    DOB: 20-Nov-1970  Age: 47 y.o. 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  Her right 2nd toe metatarsophalangeal joint MTP osteoarthritis is doing well and she denies foot pain; resumed work at the [employer] and last visit to [place] was in 10/2017. Tolerates her antihypertensive with no adverse effects. Insomnia is controlled on current regimen and she denies worsening of reflux symptoms. Her neuropathy is controlled on Lyrica and her next visit with Neurology, Dr Posey Pronto comes up in 3 months. She has right sided pain worse on coughing and deep breathing with no preceding history of trauma.  Past Medical History:  Diagnosis Date  . History of alcoholism ([place])   . Hypertension   . Malnutrition ([place])   . Peripheral neuropathy   . Strachan's syndrome     Past Surgical History:  Procedure Laterality Date  . MULTIPLE EXTRACTIONS WITH ALVEOLOPLASTY N/A 12/25/2013   Procedure: Extraction of tooth #'s 2,3,4,5,6,8,9,10,11,12,13,14,15,20,21,22,23,24,25,26,27, [phone] with alveoloplasty and bilateral mandibular tori reductions;  Surgeon: Lenn Cal, DDS;  Location: [facility];  Service: Oral Surgery;  Laterality: N/A;  . SURAL NERVE BX Right 12/24/2013   Procedure: SURAL NERVE BIOPSY;  Surgeon: Kristeen Miss, MD;  Location: [facility];  Service: Neurosurgery;  Laterality: Right;  . TUBAL LIGATION      Allergies  Allergen Reactions  . Macrobid [Nitrofurantoin Macrocrystal] Anaphylaxis and Other (See Comments)    Patient reports 2 syncope episodes after her mouth became numb     Outpatient Medications Prior to Visit  Medication Sig Dispense Refill  . Cyanocobalamin (VITAMIN B-12 IJ) Inject as directed once a week.     .  Multiple Vitamins-Minerals (MULTIVITAMIN WITH MINERALS) tablet Take 1 tablet by mouth daily.    . potassium chloride (K-DUR,KLOR-CON) 10 MEQ tablet Take 10 mEq by mouth daily.    . pregabalin (LYRICA) 100 MG capsule Take 1 capsule (100 mg total) by mouth 2 (two) times daily. 60 capsule 5  . amLODipine (NORVASC) 5 MG tablet Take 1 tablet (5 mg total) by mouth daily. 30 tablet 5  . DULoxetine (CYMBALTA) 60 MG capsule TAKE 1 CAPSULE BY MOUTH 2 TIMES DAILY. 60 capsule 5  . hydrALAZINE (APRESOLINE) 10 MG tablet Take 1 tablet (10 mg total) by mouth 3 (three) times daily. 90 tablet 5  . omeprazole (PRILOSEC) 20 MG capsule Take 1 capsule (20 mg total) by mouth daily. 30 capsule 5  . promethazine (PHENERGAN) 25 MG tablet Take 1 tablet (25 mg total) by mouth every 8 (eight) hours as needed for nausea or vomiting. 20 tablet 0  . traZODone (DESYREL) 100 MG tablet Take 1 tablet (100 mg total) by mouth at bedtime as needed for sleep. 30 tablet 5  . folic acid (FOLVITE) 1 MG tablet Take 1 tablet (1 mg total) by mouth daily. (Patient not taking: Reported on 01/01/2018)    . oxyCODONE-acetaminophen (ROXICET) 5-325 MG tablet Take 1 tablet by mouth every 8 (eight) hours as needed for severe pain. (Patient not taking: Reported on 10/02/2017) 20 tablet 0   No facility-administered medications prior to visit.     ROS Review of Systems  Constitutional: Negative for activity change, appetite change and fatigue.  HENT: Negative for congestion, sinus pressure and sore throat.  Eyes: Negative for visual disturbance.  Respiratory: Negative for cough, chest tightness, shortness of breath and wheezing.   Cardiovascular: Negative for chest pain and palpitations.  Gastrointestinal: Negative for abdominal distention, abdominal pain and constipation.  Endocrine: Negative for polydipsia.  Genitourinary: Negative for dysuria and frequency.  Musculoskeletal:       See hpi  Skin: Negative for rash.  Neurological: Negative for  tremors, light-headedness and numbness.  Hematological: Does not bruise/bleed easily.  Psychiatric/Behavioral: Negative for agitation and behavioral problems.    Objective:  BP 127/80   Pulse 74   Temp 97.8 F (36.6 C) (Oral)   Wt 144 lb 9.6 oz (65.6 kg)   SpO2 99%   BMI 24.06 kg/m   BP/Weight 01/01/2018 10/02/2017 77/93/9030  Systolic BP 092 330 076  Diastolic BP 80 87 80  Wt. (Lbs) 144.6 151.4 147.5  BMI 24.06 25.19 24.55      Physical Exam  Constitutional: She is oriented to person, place, and time. She appears well-developed and well-nourished.  Cardiovascular: Normal rate, normal heart sounds and intact distal pulses.  No murmur heard. Pulmonary/Chest: Effort normal and breath sounds normal. She has no wheezes. She has no rales. She exhibits no tenderness.  Abdominal: Soft. Bowel sounds are normal. She exhibits no distension and no mass. There is no tenderness.  Musculoskeletal: She exhibits tenderness (slight TTP on deep palpation of subcostal region and lower ribcage).  Neurological: She is alert and oriented to person, place, and time.  Skin: Skin is warm and dry.  Psychiatric: She has a normal mood and affect.     CMP Latest Ref Rng & Units 05/24/2017 04/07/2017 02/28/2017  Glucose 65 - 99 mg/dL 114(H) 87 93  BUN 6 - 24 mg/dL 4(L) 8 7  Creatinine 0.57 - 1.00 mg/dL 0.61 0.53(L) 0.56  Sodium 134 - 144 mmol/L 138 138 134(L)  Potassium 3.5 - 5.2 mmol/L 4.4 4.4 3.2(L)  Chloride 96 - 106 mmol/L 99 97 97(L)  CO2 20 - 29 mmol/L '26 24 27  '$ Calcium 8.7 - 10.2 mg/dL 8.6(L) 9.3 9.2  Total Protein 6.0 - 8.5 g/dL 6.7 - 7.4  Total Bilirubin 0.0 - 1.2 mg/dL <0.2 - 0.7  Alkaline Phos 39 - 117 IU/L 135(H) - 140(H)  AST 0 - 40 IU/L 19 - 23  ALT 0 - 32 IU/L 7 - 9(L)    Lipid Panel     Component Value Date/Time   CHOL 161 05/24/2017 1149   TRIG 95 05/24/2017 1149   HDL 51 05/24/2017 1149   CHOLHDL 3.2 05/24/2017 1149   CHOLHDL 4.0 07/19/2016 1238   VLDL 26 07/19/2016 1238     LDLCALC 91 05/24/2017 1149    Assessment & Plan:   1. Essential hypertension Controlled - amLODipine (NORVASC) 5 MG tablet; Take 1 tablet (5 mg total) by mouth daily.  Dispense: 30 tablet; Refill: 6 - hydrALAZINE (APRESOLINE) 10 MG tablet; Take 1 tablet (10 mg total) by mouth 3 (three) times daily.  Dispense: 90 tablet; Refill: 6 - CMP14+EGFR  2. Other polyneuropathy Stable - DULoxetine (CYMBALTA) 60 MG capsule; TAKE 1 CAPSULE BY MOUTH 2 TIMES DAILY.  Dispense: 60 capsule; Refill: 6  3. Gastroesophageal reflux disease, esophagitis presence not specified Controlled - omeprazole (PRILOSEC) 20 MG capsule; Take 1 capsule (20 mg total) by mouth daily.  Dispense: 30 capsule; Refill: 6  4. Other insomnia Controlled - traZODone (DESYREL) 100 MG tablet; Take 1 tablet (100 mg total) by mouth at bedtime as needed for sleep.  Dispense: 30 tablet; Refill: 6  5. Strachan's syndrome Currently doing well on Lyrica She will be seeing Neurology in 03/2018  6. Musculoskeletal pain Suspicious for costochondritis Placed on Mobic and Robaxin.  Meds ordered this encounter  Medications  . amLODipine (NORVASC) 5 MG tablet    Sig: Take 1 tablet (5 mg total) by mouth daily.    Dispense:  30 tablet    Refill:  6  . DULoxetine (CYMBALTA) 60 MG capsule    Sig: TAKE 1 CAPSULE BY MOUTH 2 TIMES DAILY.    Dispense:  60 capsule    Refill:  6  . hydrALAZINE (APRESOLINE) 10 MG tablet    Sig: Take 1 tablet (10 mg total) by mouth 3 (three) times daily.    Dispense:  90 tablet    Refill:  6  . omeprazole (PRILOSEC) 20 MG capsule    Sig: Take 1 capsule (20 mg total) by mouth daily.    Dispense:  30 capsule    Refill:  6  . traZODone (DESYREL) 100 MG tablet    Sig: Take 1 tablet (100 mg total) by mouth at bedtime as needed for sleep.    Dispense:  30 tablet    Refill:  6    Follow-up: Return in about 6 months (around 07/03/2018) for Follow-up of chronic medical conditions.   Charlott Rakes MD

## 2018-01-02 LAB — CMP14+EGFR
ALT: 11 IU/L (ref 0–32)
AST: 17 IU/L (ref 0–40)
Albumin/Globulin Ratio: 1.3 (ref 1.2–2.2)
Albumin: 3.9 g/dL (ref 3.5–5.5)
Alkaline Phosphatase: 136 IU/L — ABNORMAL HIGH (ref 39–117)
BUN/Creatinine Ratio: 6 — ABNORMAL LOW (ref 9–23)
BUN: 4 mg/dL — ABNORMAL LOW (ref 6–24)
Bilirubin Total: 0.3 mg/dL (ref 0.0–1.2)
CO2: 23 mmol/L (ref 20–29)
Calcium: 9 mg/dL (ref 8.7–10.2)
Chloride: 100 mmol/L (ref 96–106)
Creatinine, Ser: 0.64 mg/dL (ref 0.57–1.00)
GFR calc Af Amer: 124 mL/min/{1.73_m2} (ref >59)
GFR calc non Af Amer: 107 mL/min/{1.73_m2} (ref >59)
Globulin, Total: 3.1 g/dL (ref 1.5–4.5)
Glucose: 117 mg/dL — ABNORMAL HIGH (ref 65–99)
Potassium: 3.7 mmol/L (ref 3.5–5.2)
Sodium: 139 mmol/L (ref 134–144)
Total Protein: 7 g/dL (ref 6.0–8.5)

## 2018-03-05 ENCOUNTER — Encounter (HOSPITAL_COMMUNITY): Payer: Self-pay | Admitting: Emergency Medicine

## 2018-03-05 ENCOUNTER — Ambulatory Visit (HOSPITAL_COMMUNITY)
Admission: EM | Admit: 2018-03-05 | Discharge: 2018-03-05 | Disposition: A | Discharge status: Home / Self Care | Payer: Medicaid Other | Attending: Family Medicine | Admitting: Family Medicine

## 2018-03-05 DIAGNOSIS — W260XXA Contact with knife, initial encounter: Secondary | ICD-10-CM

## 2018-03-05 DIAGNOSIS — S61317A Laceration without foreign body of left little finger with damage to nail, initial encounter: Secondary | ICD-10-CM | POA: Diagnosis not present

## 2018-03-05 NOTE — ED Provider Notes (Signed)
[facility]    CSN: [id number] Arrival date & time: 03/05/18  1933     History   Chief Complaint No chief complaint on file.   HPI [name] is a 47 y.o. female.   HPI  Capri works in a [employer] for living.  She got off work and went home.  She was dicing tomatoes in a professional dicer and lost her attention for a moment, and caught her little finger in the dicer.  She is here for laceration. She is pressure for bleeding control. Her tetanus was 1 year ago.  Past Medical History:  Diagnosis Date  . History of alcoholism ([place])   . Hypertension   . Malnutrition ([place])   . Peripheral neuropathy   . Strachan's syndrome     Patient Active Problem List   Diagnosis Date Noted  . Insomnia 10/02/2017  . Ingrown toenail 04/05/2017  . Closed nondisplaced fracture of proximal phalanx of lesser toe of right foot 04/05/2017  . 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 Peripheral neuropathy 12/24/2013  . Lower extremity pain, bilateral 12/18/2013    Past Surgical History:  Procedure Laterality Date  . MULTIPLE EXTRACTIONS WITH ALVEOLOPLASTY N/A 12/25/2013   Procedure: Extraction of tooth #'s 2,3,4,5,6,8,9,10,11,12,13,14,15,20,21,22,23,24,25,26,27, [phone] with alveoloplasty and bilateral mandibular tori reductions;  Surgeon: Lenn Cal, DDS;  Location: [facility];  Service: Oral Surgery;  Laterality: N/A;  . SURAL NERVE BX Right 12/24/2013   Procedure: SURAL NERVE BIOPSY;  Surgeon: Kristeen Miss, MD;  Location: [facility];  Service: Neurosurgery;  Laterality: Right;  . TUBAL LIGATION      OB History   None      Home Medications    Prior to Admission medications   Medication Sig Start Date End Date Taking? Authorizing Provider  amLODipine (NORVASC) 5 MG tablet Take 1 tablet (5 mg total) by mouth daily. 01/01/18   Charlott Rakes, MD  Cyanocobalamin (VITAMIN B-12 IJ) Inject as directed once a week.     [provider]    DULoxetine (CYMBALTA) 60 MG capsule TAKE 1 CAPSULE BY MOUTH 2 TIMES DAILY. 01/01/18   Charlott Rakes, MD  hydrALAZINE (APRESOLINE) 10 MG tablet Take 1 tablet (10 mg total) by mouth 3 (three) times daily. 01/01/18   Charlott Rakes, MD  meloxicam (MOBIC) 7.5 MG tablet Take 1 tablet (7.5 mg total) by mouth daily. 01/01/18   Charlott Rakes, MD  methocarbamol (ROBAXIN) 500 MG tablet Take 1 tablet (500 mg total) by mouth 2 (two) times daily as needed for muscle spasms. 01/01/18   Charlott Rakes, MD  Multiple Vitamins-Minerals (MULTIVITAMIN WITH MINERALS) tablet Take 1 tablet by mouth daily.    [provider]  omeprazole (PRILOSEC) 20 MG capsule Take 1 capsule (20 mg total) by mouth daily. 01/01/18   Charlott Rakes, MD  potassium chloride (K-DUR,KLOR-CON) 10 MEQ tablet Take 10 mEq by mouth daily.    [provider]  pregabalin (LYRICA) 100 MG capsule Take 1 capsule (100 mg total) by mouth 2 (two) times daily. 09/11/17   Narda Amber K, DO  traZODone (DESYREL) 100 MG tablet Take 1 tablet (100 mg total) by mouth at bedtime as needed for sleep. 01/01/18   Charlott Rakes, MD    Family History Family History  Adopted: Yes  Problem Relation Age of Onset  . Diabetes Father   . Hypertension Mother   . Raynaud syndrome Mother   . Diabetes Maternal Aunt   . Heart disease Maternal Aunt   .  Diabetes Maternal Uncle   . Heart disease Maternal Uncle   . Drug abuse Maternal Grandmother   . Cancer Maternal Grandmother        intestinal  . Hypertension Maternal Grandmother   . Cancer Maternal Grandfather        lung  . Hypertension Paternal Grandfather     Social History Social History   Tobacco Use  . Smoking status: Current Every Day Smoker    Packs/day: 0.50    Years: 10.00    Pack years: 5.00  . Smokeless tobacco: Never Used  Substance Use Topics  . Alcohol use: No  . Drug use: No     Allergies   Macrobid [nitrofurantoin macrocrystal]   Review of Systems Review of  Systems  Constitutional: Negative for chills and fever.  HENT: Negative for ear pain and sore throat.   Eyes: Negative for pain and visual disturbance.  Respiratory: Negative for cough and shortness of breath.   Cardiovascular: Negative for chest pain and palpitations.  Gastrointestinal: Negative for abdominal pain and vomiting.  Genitourinary: Negative for dysuria and hematuria.  Musculoskeletal: Negative for arthralgias and back pain.  Skin: Positive for wound. Negative for color change and rash.  Neurological: Negative for seizures and syncope.  All other systems reviewed and are negative.    Physical Exam Triage Vital Signs ED Triage Vitals [03/05/18 1943]  Enc Vitals Group     BP (!) 160/98     Pulse Rate 84     Resp 18     Temp 97.9 F (36.6 C)     Temp src      SpO2 100 %     Weight      Height      Head Circumference      Peak Flow      Pain Score      Pain Loc      Pain Edu?      Excl. in [place]?    No data found.  Updated Vital Signs BP (!) 160/98   Pulse 84   Temp 97.9 F (36.6 C)   Resp 18   SpO2 100%   Visual Acuity Right Eye Distance:   Left Eye Distance:   Bilateral Distance:    Right Eye Near:   Left Eye Near:    Bilateral Near:     Physical Exam  Constitutional: She appears well-developed and well-nourished. No distress.  HENT:  Head: Normocephalic and atraumatic.  Mouth/Throat: Oropharynx is clear and moist.  Eyes: Pupils are equal, round, and reactive to light. Conjunctivae are normal.  Neck: Normal range of motion.  Cardiovascular: Normal rate.  Pulmonary/Chest: Effort normal. No respiratory distress.  Abdominal: Soft. She exhibits no distension.  Musculoskeletal: Normal range of motion. She exhibits no edema.  Neurological: She is alert.  Skin: Skin is warm and dry.  Psychiatric: She has a normal mood and affect. Her behavior is normal.     UC Treatments / Results  Labs (all labs ordered are listed, but only abnormal results  are displayed) Labs Reviewed - No data to display  EKG None  Radiology No results found.  Procedures Laceration Repair Date/Time: 03/05/2018 9:13 PM Performed by: Raylene Everts, MD Authorized by: Raylene Everts, MD   Consent:    Consent obtained:  Verbal   Consent given by:  Patient   Risks discussed:  Infection, pain and need for additional repair   Alternatives discussed:  No treatment Anesthesia (see MAR for exact dosages):  Anesthesia method:  Nerve block   Block needle gauge:  25 G   Block anesthetic:  Lidocaine 2% w/o epi   Block injection procedure:  Anatomic landmarks identified, introduced needle, negative aspiration for blood and incremental injection   Block outcome:  Anesthesia achieved Laceration details:    Location:  Finger   Length (cm):  1.5   Depth (mm):  7 Repair type:    Repair type:  Simple Pre-procedure details:    Preparation:  Patient was prepped and draped in usual sterile fashion Exploration:    Hemostasis achieved with:  Direct pressure   Wound exploration: wound explored through full range of motion     Wound extent: areolar tissue violated     Contaminated: no   Treatment:    Area cleansed with:  Hibiclens   Amount of cleaning:  Standard   Visualized foreign bodies/material removed: no   Skin repair:    Repair method:  Sutures   Suture size:  4-0   Suture material:  Prolene   Suture technique:  Simple interrupted   Number of sutures:  3 Approximation:    Approximation:  Close Post-procedure details:    Dressing:  Sterile dressing   Patient tolerance of procedure:  Tolerated well, no immediate complications Comments:     After repairing the laceration on the palmar aspect of the fifth finger tip, the nail was inspected.  There is a laceration partially through the nail into the nail bed.  Nondisplaced.  Area is dry.  Nail was recommended.  Dermabond was used to seal.   (including critical care time)  Medications Ordered  in UC Medications - No data to display  Initial Impression / Assessment and Plan / UC Course  I have reviewed the triage vital signs and the nursing notes.  Pertinent labs & imaging results that were available during my care of the patient were reviewed by me and considered in my medical decision making (see chart for details).     Discussed that with nailbed damage the nail does not always growing normally.  With a superficial slice to the nailbed there was nothing to suture.  It was glued in place with the hope will heal.  Patient understands. Final Clinical Impressions(s) / UC Diagnoses   Final diagnoses:  Laceration of left little finger without foreign body with damage to nail, initial encounter     Discharge Instructions     Keep clean and dry Watch for infection - pain, swelling, redness or drainage Stitches out in 7-10 days     ED Prescriptions    None     Controlled Substance Prescriptions [place] Controlled Substance Registry consulted? Not Applicable   Raylene Everts, MD 03/05/18 2116

## 2018-03-05 NOTE — Discharge Instructions (Signed)
Keep clean and dry Watch for infection - pain, swelling, redness or drainage Stitches out in 7-10 days

## 2018-03-05 NOTE — ED Triage Notes (Signed)
Pt states she cut her L pinkie finger on a dicer two hours PTA.

## 2018-03-10 ENCOUNTER — Other Ambulatory Visit: Payer: Self-pay | Admitting: Neurology

## 2018-03-12 ENCOUNTER — Ambulatory Visit: Payer: Medicaid Other | Admitting: Neurology

## 2018-03-12 ENCOUNTER — Encounter: Payer: Self-pay | Admitting: Neurology

## 2018-03-12 VITALS — BP 138/90 | HR 73 | Ht 65.0 in | Wt 143.1 lb

## 2018-03-12 DIAGNOSIS — G589 Mononeuropathy, unspecified: Secondary | ICD-10-CM

## 2018-03-12 DIAGNOSIS — Z720 Tobacco use: Secondary | ICD-10-CM | POA: Diagnosis not present

## 2018-03-12 DIAGNOSIS — E5111 Dry beriberi: Secondary | ICD-10-CM

## 2018-03-12 DIAGNOSIS — G6289 Other specified polyneuropathies: Secondary | ICD-10-CM

## 2018-03-12 MED ORDER — PREGABALIN 100 MG PO CAPS: 100.0000 mg | ORAL_CAPSULE | Freq: Three times a day (TID) | ORAL | 5 refills | Status: DC

## 2018-03-12 NOTE — Progress Notes (Signed)
Follow-up Visit   Date: 03/12/18    [name] MRN: [id number] DOB: 1970-11-06   Interim History: [name] is a 47 y.o. ight-handed Caucasian female with [name] syndrome, previous alcoholism, tobacco use, hypertension, and GERD returning to the clinic for follow-up of nutritional ataxic neuropathy.  The patient was accompanied to the clinic by boyfriend who also provides collateral information.    History of present illness: She started having tingling and weakness of the legs in Dec 13, 2011 and eventually sought medical attention in 2013/12/12 because of progressive worsening pain and weakness of the legs and malnutrition.  She was hospitalized and had extensive neurological work-up including CSF analysis, serology testing, and sural nerve biopsy.  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   She was diagnosed with alcoholic and nutritional deficiency neuropathy and was seeing Dr. [name] at [facility] for follow-up.  Her husband passed away in 12/13/11 and she became depressed, which led to her drinking alcohol.  She has been sober since this time.    She noticed gradual improvement over the following year where she was able to walk again.  She continues stumble often because of lack of sensation and imbalance, but walks unassisted.  She has tingling and burning sensation involving the lower legs, especially from the knee down and the fingertips.  She was prescribed gabapentin '600mg'$  TID, previously she was taking '600mg'$  fives times per day.  She did not take the gabapentin as prescribed and was taking gabapentin '600mg'$  at 7am and  2pm, and '1200mg'$  at 10pm. She also takes Cymbalta '60mg'$  twice daily.  She has been noncompliant with multivitamin and folic acid.  She was seeing Dr. [name] at [facility] since 2013-12-12 and was discharged in Dec 13, 2015 due to too many no shows.   She quit drinking alcohol in 12-13-2014.  She was consuming 7-8 beers daily x 3  years and has been sober since then.  UPDATE 09/11/2017: At her last visit, she was found to have vitamin B1, vitamin B12, and folate deficiency and started on supplementation.  She is compliant with vitamin B12, but does not take folic acid or thiamine.  Her boyfriend states that she is eating much better and has gained 20lb.  She has started to drink socially, about twice per month.  She had one fall and fractured her right 2nd toe, requiring right foot surgery in September.  She has recovered well from this.  Regarding her neuropathy, she continues to have imbalance and burning pain of the lower legs and feet.  PCP switched her gabapentin to Lyrica '75mg'$  in September and would like the dose increased a little.    UPDATE 03/12/2018:  She is here for 6 month follow-up visit.  She continues to have painful tingling and burning of the feet and lower legs.  She also complains of constant numbness in the lower legs.  She has some relief of Lyrica '100mg'$  twice daily.  She works in [employer] for [employer] and complaints of legs feeling worse after a long day at work.  She works 7-days on and 7-days off and is inquiring about disability. No recent falls.  Medications:  Current Outpatient Medications on File Prior to Visit  Medication Sig Dispense Refill  . amLODipine (NORVASC) 5 MG tablet Take 1 tablet (5 mg total) by mouth daily. 30 tablet 6  . Cyanocobalamin (VITAMIN B-12 IJ) Inject as directed once a week.     . DULoxetine (  CYMBALTA) 60 MG capsule TAKE 1 CAPSULE BY MOUTH 2 TIMES DAILY. 60 capsule 6  . hydrALAZINE (APRESOLINE) 10 MG tablet Take 1 tablet (10 mg total) by mouth 3 (three) times daily. 90 tablet 6  . meloxicam (MOBIC) 7.5 MG tablet Take 1 tablet (7.5 mg total) by mouth daily. 30 tablet 1  . methocarbamol (ROBAXIN) 500 MG tablet Take 1 tablet (500 mg total) by mouth 2 (two) times daily as needed for muscle spasms. 60 tablet 1  . Multiple Vitamins-Minerals (MULTIVITAMIN WITH  MINERALS) tablet Take 1 tablet by mouth daily.    Marland Kitchen omeprazole (PRILOSEC) 20 MG capsule Take 1 capsule (20 mg total) by mouth daily. 30 capsule 6  . potassium chloride (K-DUR,KLOR-CON) 10 MEQ tablet Take 10 mEq by mouth daily.    . traZODone (DESYREL) 100 MG tablet Take 1 tablet (100 mg total) by mouth at bedtime as needed for sleep. 30 tablet 6   No current facility-administered medications on file prior to visit.     Allergies:  Allergies  Allergen Reactions  . Macrobid [Nitrofurantoin Macrocrystal] Anaphylaxis and Other (See Comments)    Patient reports 2 syncope episodes after her mouth became numb    Review of Systems:  CONSTITUTIONAL: No fevers, chills, night sweats, or weight loss.  EYES: No visual changes or eye pain ENT: No hearing changes.  No history of nose bleeds.   RESPIRATORY: No cough, wheezing and shortness of breath.   CARDIOVASCULAR: Negative for chest pain, and palpitations.   GI: Negative for abdominal discomfort, blood in stools or black stools.  No recent change in bowel habits.   GU:  No history of incontinence.   MUSCLOSKELETAL: +history of joint pain or swelling.  No myalgias.   SKIN: Negative for lesions, rash, and itching.   ENDOCRINE: Negative for cold or heat intolerance, polydipsia or goiter.   PSYCH:  No depression or anxiety symptoms.   NEURO: As Above.   Vital Signs:  BP 138/90   Pulse 73   Ht '5\' 5"'$  (1.651 m)   Wt 143 lb 2 oz (64.9 kg)   SpO2 99%   BMI 23.82 kg/m    General Medical Exam:   General:  Well appearing, comfortable  Neck:   No carotid bruits. Respiratory:  Clear to auscultation, good air entry bilaterally.   Cardiac:  Regular rate and rhythm, no murmur.   Ext:  No edema  Neurological Exam: MENTAL STATUS including orientation to time, place, person, recent and remote memory, attention span and concentration, language, and fund of knowledge is normal.  Speech is not dysarthric.  CRANIAL NERVES:  Pupils equal round and  reactive to light.  Normal conjugate, extra-ocular eye movements in all directions of gaze.  No ptosis.  Face is symmetric. Palate elevates symmetrically.  Tongue is midline.  MOTOR:  Motor strength is 5/5 in all extremities.  No atrophy, fasciculations or abnormal movements.  No pronator drift.  Tone is normal.    MSRs:  Right                                                                 Left brachioradialis 2+  brachioradialis 2+  biceps 2+  biceps 2+  triceps 2+  triceps 2+  patellar 0  Patellar tr  ankle jerk 0  ankle jerk 0   SENSORY:  Intact to vibration is intact at the hands, absent distal to knees bilaterally.  COORDINATION/GAIT:  Normal finger-to- nose-finger. Gait narrow based and stable.  She is unable to perform stressed or tandem gait  Data: NCS/EMG 01/20/2014:  Nerve conduction studies done on all 4 extremities show evidence of a mild to moderate primarily axonal peripheral neuropathy. EMG evaluation of the right lower extremity was relatively unremarkable. Labs 12/2013:  ANA negative. RPR nonreactive, HIV nonreactive, ESR only at 5, rheumatoid factor negative, copper 127, Vitamin B12 at 381. Heavy metal screen is neg, SPEP no M protein CSF 12/24/2013:  CSF R1 W0 G60 P34  Labs 11/16/2016:  Vitamin B12 119*, vitamin B1 < 7, MMA 216, folate 2.2* Labs 09/11/2017:  Vitamin B12 1042, folate 23.6, vitamin B1 7*  IMPRESSION/PLAN: 1.  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  She continues to have painful paresthesias of the legs.  It was explained that her numbness is not reversible and the goal of medications such as Lyrica is help cope with her painful tingling. I will optimize Lyrica to '100mg'$  TID to see if we can control pain better.  I also explained that she can consider seeking alternative job which would allow her to take breaks and minimize standing for prolonged periods of time, as I do not see that she would qualify  for disability for her neuropathy.   2.  Nutritional deficiencies.  Continue vitamin B12 1010mg daily and vitamin B1 '100mg'$  daily  3.  Tobacco cessation counseling.  She is smoking 0.5 PPD and does not express interest in quitting.  Patient was informed of the dangers of tobacco abuse including stroke, cancer, and MI, as well as benefits of tobacco cessation.  Approximately 4 mins were spent counseling patient cessation techniques and stressing the importance of cessation.  I will reassess her progress at the next follow-up visit  Call with update in 2 months, if tolerating Lyrica, this can be titrated further.  Return to clinic in 6 months  Greater than 50% of this 25 minute visit was spent in counseling, explanation of diagnosis, planning of further management, and coordination of care.   Thank you for allowing me to participate in patient's care.  If I can answer any additional questions, I would be pleased to do so.    Sincerely,    Maiah Sinning K. PPosey Pronto DO

## 2018-03-12 NOTE — Patient Instructions (Addendum)
Increase Lyrica 100mg  three times daily  Continue vitamin B12 1000mg  and thiamine 100mg  daily  Recommend stop smoking and alcohol  Call with an update in 2 months  Return to clinic in 6 months

## 2018-04-12 ENCOUNTER — Encounter: Payer: Medicaid Other | Admitting: Podiatry

## 2018-04-18 NOTE — Progress Notes (Signed)
No show

## 2018-05-03 ENCOUNTER — Encounter (HOSPITAL_COMMUNITY): Payer: Self-pay | Admitting: *Deleted

## 2018-05-03 ENCOUNTER — Emergency Department (HOSPITAL_COMMUNITY)
Admission: EM | Admit: 2018-05-03 | Discharge: 2018-05-04 | Disposition: A | Discharge status: Home / Self Care | Payer: Medicaid Other | Attending: Emergency Medicine | Admitting: Emergency Medicine

## 2018-05-03 ENCOUNTER — Emergency Department (HOSPITAL_COMMUNITY): Payer: Medicaid Other

## 2018-05-03 ENCOUNTER — Other Ambulatory Visit: Payer: Self-pay

## 2018-05-03 DIAGNOSIS — Y999 Unspecified external cause status: Secondary | ICD-10-CM | POA: Diagnosis not present

## 2018-05-03 DIAGNOSIS — Z79899 Other long term (current) drug therapy: Secondary | ICD-10-CM | POA: Diagnosis not present

## 2018-05-03 DIAGNOSIS — S92334A Nondisplaced fracture of third metatarsal bone, right foot, initial encounter for closed fracture: Secondary | ICD-10-CM | POA: Diagnosis not present

## 2018-05-03 DIAGNOSIS — Y939 Activity, unspecified: Secondary | ICD-10-CM | POA: Diagnosis not present

## 2018-05-03 DIAGNOSIS — S92301A Fracture of unspecified metatarsal bone(s), right foot, initial encounter for closed fracture: Secondary | ICD-10-CM

## 2018-05-03 DIAGNOSIS — I10 Essential (primary) hypertension: Secondary | ICD-10-CM | POA: Diagnosis not present

## 2018-05-03 DIAGNOSIS — S92324A Nondisplaced fracture of second metatarsal bone, right foot, initial encounter for closed fracture: Secondary | ICD-10-CM | POA: Diagnosis not present

## 2018-05-03 DIAGNOSIS — F17200 Nicotine dependence, unspecified, uncomplicated: Secondary | ICD-10-CM | POA: Diagnosis not present

## 2018-05-03 DIAGNOSIS — Y929 Unspecified place or not applicable: Secondary | ICD-10-CM | POA: Diagnosis not present

## 2018-05-03 DIAGNOSIS — W1842XA Slipping, tripping and stumbling without falling due to stepping into hole or opening, initial encounter: Secondary | ICD-10-CM | POA: Insufficient documentation

## 2018-05-03 DIAGNOSIS — S99921A Unspecified injury of right foot, initial encounter: Secondary | ICD-10-CM | POA: Diagnosis present

## 2018-05-03 DIAGNOSIS — S92321A Displaced fracture of second metatarsal bone, right foot, initial encounter for closed fracture: Secondary | ICD-10-CM | POA: Diagnosis not present

## 2018-05-03 DIAGNOSIS — S92351A Displaced fracture of fifth metatarsal bone, right foot, initial encounter for closed fracture: Secondary | ICD-10-CM | POA: Diagnosis not present

## 2018-05-03 DIAGNOSIS — S92344A Nondisplaced fracture of fourth metatarsal bone, right foot, initial encounter for closed fracture: Secondary | ICD-10-CM | POA: Diagnosis not present

## 2018-05-03 NOTE — ED Triage Notes (Signed)
Pt stepped off her porch wrong and injured her R foot. Has neuropathy in that foot as well. Swelling noted

## 2018-05-04 ENCOUNTER — Emergency Department (HOSPITAL_COMMUNITY): Payer: Medicaid Other

## 2018-05-04 DIAGNOSIS — S92351A Displaced fracture of fifth metatarsal bone, right foot, initial encounter for closed fracture: Secondary | ICD-10-CM | POA: Diagnosis not present

## 2018-05-04 MED ORDER — OXYCODONE-ACETAMINOPHEN 5-325 MG PO TABS: 1.0000 | ORAL_TABLET | ORAL | 0 refills | Status: DC | PRN

## 2018-05-04 MED ORDER — HYDROCODONE-ACETAMINOPHEN 5-325 MG PO TABS: 1.0000 | ORAL_TABLET | ORAL | 0 refills | Status: DC | PRN

## 2018-05-04 MED ORDER — OXYCODONE-ACETAMINOPHEN 5-325 MG PO TABS
1.0000 | ORAL_TABLET | Freq: Once | ORAL | Status: AC
  Administered 2018-05-04: 1 via ORAL
  Filled 2018-05-04: qty 1

## 2018-05-04 NOTE — ED Provider Notes (Signed)
[facility] EMERGENCY DEPARTMENT Provider Note   CSN: [id number] Arrival date & time: 05/03/18  2302     History   Chief Complaint Chief Complaint  Patient presents with  . Foot Injury    HPI [name] is a 47 y.o. female.  HPI 47 year old Caucasian female past medical history significant for peripheral neuropathy presents to the emergency department today for evaluation of right foot pain after mechanical injury.  Patient states that this evening she stepped in a hole and twisted her right foot.  She reports significant pain to her right foot and unable to bear weight on the right foot.  She reports decreased sensation at baseline.  She denies any head injury or LOC.  She has not taken anything for the pain prior to arrival.  Pain is worse with palpation and range of motion.  Denies weakness. Past Medical History:  Diagnosis Date  . History of alcoholism ([place])   . Hypertension   . Malnutrition ([place])   . Peripheral neuropathy   . Strachan's syndrome     Patient Active Problem List   Diagnosis Date Noted  . Insomnia 10/02/2017  . Ingrown toenail 04/05/2017  . Closed nondisplaced fracture of proximal phalanx of lesser toe of right foot 04/05/2017  . 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 Peripheral neuropathy 12/24/2013  . Lower extremity pain, bilateral 12/18/2013    Past Surgical History:  Procedure Laterality Date  . MULTIPLE EXTRACTIONS WITH ALVEOLOPLASTY N/A 12/25/2013   Procedure: Extraction of tooth #'s 2,3,4,5,6,8,9,10,11,12,13,14,15,20,21,22,23,24,25,26,27, [id number] with alveoloplasty and bilateral mandibular tori reductions;  Surgeon: Lenn Cal, DDS;  Location: [facility];  Service: Oral Surgery;  Laterality: N/A;  . SURAL NERVE BX Right 12/24/2013   Procedure: SURAL NERVE BIOPSY;  Surgeon: Kristeen Miss, MD;  Location: [facility];  Service: Neurosurgery;  Laterality: Right;  . TUBAL LIGATION       OB History     None      Home Medications    Prior to Admission medications   Medication Sig Start Date End Date Taking? Authorizing Provider  amLODipine (NORVASC) 5 MG tablet Take 1 tablet (5 mg total) by mouth daily. 01/01/18   Charlott Rakes, MD  Cyanocobalamin (VITAMIN B-12 IJ) Inject as directed once a week.     [provider]  DULoxetine (CYMBALTA) 60 MG capsule TAKE 1 CAPSULE BY MOUTH 2 TIMES DAILY. 01/01/18   Charlott Rakes, MD  hydrALAZINE (APRESOLINE) 10 MG tablet Take 1 tablet (10 mg total) by mouth 3 (three) times daily. 01/01/18   Charlott Rakes, MD  meloxicam (MOBIC) 7.5 MG tablet Take 1 tablet (7.5 mg total) by mouth daily. 01/01/18   Charlott Rakes, MD  methocarbamol (ROBAXIN) 500 MG tablet Take 1 tablet (500 mg total) by mouth 2 (two) times daily as needed for muscle spasms. 01/01/18   Charlott Rakes, MD  Multiple Vitamins-Minerals (MULTIVITAMIN WITH MINERALS) tablet Take 1 tablet by mouth daily.    [provider]  omeprazole (PRILOSEC) 20 MG capsule Take 1 capsule (20 mg total) by mouth daily. 01/01/18   Charlott Rakes, MD  oxyCODONE-acetaminophen (PERCOCET/ROXICET) 5-325 MG tablet Take 1-2 tablets by mouth every 4 (four) hours as needed for severe pain. 05/04/18   Ocie Cornfield T, PA-C  potassium chloride (K-DUR,KLOR-CON) 10 MEQ tablet Take 10 mEq by mouth daily.    [provider]  pregabalin (LYRICA) 100 MG capsule Take 1 capsule (100 mg total) by mouth 3 (three) times daily. 03/12/18  Patel, Donika K, DO  traZODone (DESYREL) 100 MG tablet Take 1 tablet (100 mg total) by mouth at bedtime as needed for sleep. 01/01/18   Charlott Rakes, MD    Family History Family History  Adopted: Yes  Problem Relation Age of Onset  . Diabetes Father   . Hypertension Mother   . Raynaud syndrome Mother   . Diabetes Maternal Aunt   . Heart disease Maternal Aunt   . Diabetes Maternal Uncle   . Heart disease Maternal Uncle   . Drug abuse Maternal Grandmother   .  Cancer Maternal Grandmother        intestinal  . Hypertension Maternal Grandmother   . Cancer Maternal Grandfather        lung  . Hypertension Paternal Grandfather     Social History Social History   Tobacco Use  . Smoking status: Current Every Day Smoker    Packs/day: 0.50    Years: 10.00    Pack years: 5.00  . Smokeless tobacco: Never Used  Substance Use Topics  . Alcohol use: No  . Drug use: No     Allergies   Macrobid [nitrofurantoin macrocrystal]   Review of Systems Review of Systems  Musculoskeletal: Positive for arthralgias, joint swelling and myalgias.  Skin: Positive for color change.  Neurological: Positive for numbness. Negative for weakness.     Physical Exam Updated Vital Signs BP (!) 152/96 (BP Location: Right Arm)   Pulse 80   Resp 15   LMP 04/09/2018   SpO2 98%   Physical Exam  Constitutional: She appears well-developed and well-nourished. No distress.  HENT:  Head: Normocephalic and atraumatic.  Eyes: Right eye exhibits no discharge. Left eye exhibits no discharge. No scleral icterus.  Neck: Normal range of motion.  Pulmonary/Chest: No respiratory distress.  Musculoskeletal:       Right foot: There is decreased range of motion, tenderness, bony tenderness and swelling. There is normal capillary refill, no crepitus, no deformity and no laceration.  Patient has edema and ecchymosis to the dorsum of the right foot.  Tender to palpation.  Limited range of motion secondary to pain.  There is no edema of the ankle.  Cap refill is normal.  DP pulses are 2+ bilaterally.  Decreased sensation at baseline given neuropathy.  Patient has normal skin compartments.  Full range of motion of the right knee without pain.  No obvious deformity, crepitus noted.  Neurological: She is alert.  Skin: No pallor.  Psychiatric: Her behavior is normal. Judgment and thought content normal.  Nursing note and vitals reviewed.    ED Treatments / Results  Labs (all labs  ordered are listed, but only abnormal results are displayed) Labs Reviewed - No data to display  EKG None  Radiology Dg Ankle Complete Right  Result Date: 05/04/2018 CLINICAL DATA:  Right foot and ankle pain after fall stepping off porch. EXAM: RIGHT ANKLE - COMPLETE 3+ VIEW COMPARISON:  Foot radiograph 1 hour ago. FINDINGS: Fractures at the base of the second through fourth metatarsals on associated foot radiograph are partially visualized. No additional fracture of the ankle. The ankle mortise is preserved. Mild soft tissue edema. IMPRESSION: No acute ankle fracture. Partially included proximal metatarsal fractures as seen on foot radiograph. Electronically Signed   By: Jeb Levering M.D.   On: 05/04/2018 01:05   Dg Foot Complete Right  Result Date: 05/03/2018 CLINICAL DATA:  Right foot pain after injury stepping off porch. Swelling. EXAM: RIGHT FOOT COMPLETE - 3+ VIEW  COMPARISON:  Radiograph 11/02/2017, multiple prior exams FINDINGS: Comminuted essentially nondisplaced fractures at the base of the second, third, and fourth metatarsals. Possible intra-articular extension at the third tarsal metatarsal joint. No Lisfranc joint widening. Alignment appears maintained. Remote second toe proximal phalanx fracture. There is soft tissue edema over the dorsum of the foot. IMPRESSION: Comminuted nondisplaced fractures base of the second, third, and fourth metatarsals with possible intra-articular extension of the third ray fracture. No Lisfranc joint widening. Electronically Signed   By: Jeb Levering M.D.   On: 05/03/2018 23:42    Procedures Procedures (including critical care time)  Medications Ordered in ED Medications  oxyCODONE-acetaminophen (PERCOCET/ROXICET) 5-325 MG per tablet 1 tablet (1 tablet Oral Given 05/04/18 0120)     Initial Impression / Assessment and Plan / ED Course  I have reviewed the triage vital signs and the nursing notes.  Pertinent labs & imaging results that were  available during my care of the patient were reviewed by me and considered in my medical decision making (see chart for details).     Presents to the ED for right foot pain after mechanical injury.  Patient neurovascularly intact.  Compartments are soft.  X-ray shows comminuted fracture of second third and fourth metatarsals that are nondisplaced.  No Lisfranc joint widening.  Ankle x-ray shows no acute abnormalities.  Discussed with Dr. Marcelino Scot with orthopedics.  Recommend stirrup and short leg splint with crutches and nonweightbearing until follow-up with orthopedics.  Patient will be given short course of pain medication.   Pt is hemodynamically stable, in NAD, & able to ambulate in the ED. Evaluation does not show pathology that would require ongoing emergent intervention or inpatient treatment. I explained the diagnosis to the patient. Pain has been managed & has no complaints prior to dc. Pt is comfortable with above plan and is stable for discharge at this time. All questions were answered prior to disposition. Strict return precautions for f/u to the ED were discussed. Encouraged follow up with PCP.  I have reviewed the patient's information in the [facility] for the past 12 months and found them to have one prescription for oxycodone last year.  Opiates were prescribed for an acute, painful condition. The patient was given information on side effects and encouraged to use other, non-opiate pain medication primary, only using opiate medicine sparingly for severe pain.      Final Clinical Impressions(s) / ED Diagnoses   Final diagnoses:  Closed nondisplaced fracture of metatarsal bone of right foot, unspecified metatarsal, initial encounter    ED Discharge Orders         Ordered    HYDROcodone-acetaminophen (NORCO/VICODIN) 5-325 MG tablet  Every 4 hours PRN,   Status:  Discontinued     05/04/18 0127    oxyCODONE-acetaminophen (PERCOCET/ROXICET) 5-325  MG tablet  Every 4 hours PRN     05/04/18 0212           Doristine Devoid, PA-C 05/04/18 Noonan, Ankit, MD 05/04/18 2311

## 2018-05-04 NOTE — Discharge Instructions (Signed)
Hydrocodone and mtorin as needed for pain. Ice affected area (see instructions below).  Please call the orthopedic physician listed today or first thing in the morning to schedule a follow up appointment.   No weightbearing.  Fractures generally take 4-6 weeks to heal. It is very important to keep your splint dry until your follow up with the orthopedic doctor and a cast can be applied. You may place a plastic bag around the extremity with the splint while bathing to keep it dry. Also try to sleep with the extremity elevated for the next several nights to decrease swelling. Check the fingertips and toes several times per day to make sure they are not cold, pale, or blue. If this is the case, the splint may be too tight and should return to the ER, your regular doctor or the orthopedist for recheck. Return to the ER for new or worsening symptoms, any additional concerns.   COLD THERAPY DIRECTIONS:  Ice or gel packs can be used to reduce both pain and swelling. Ice is the most helpful within the first 24 to 48 hours after an injury or flareup from overusing a muscle or joint.  Ice is effective, has very few side effects, and is safe for most people to use.   If you expose your skin to cold temperatures for too long or without the proper protection, you can damage your skin or nerves. Watch for signs of skin damage due to cold.   HOME CARE INSTRUCTIONS  Follow these tips to use ice and cold packs safely.  Place a dry or damp towel between the ice and skin. A damp towel will cool the skin more quickly, so you may need to shorten the time that the ice is used.  For a more rapid response, add gentle compression to the ice.  Ice for no more than 10 to 20 minutes at a time. The bonier the area you are icing, the less time it will take to get the benefits of ice.  Check your skin after 5 minutes to make sure there are no signs of a poor response to cold or skin damage.  Rest 20 minutes or more in between  uses.  Once your skin is numb, you can end your treatment. You can test numbness by very lightly touching your skin. The touch should be so light that you do not see the skin dimple from the pressure of your fingertip. When using ice, most people will feel these normal sensations in this order: cold, burning, aching, and numbness.

## 2018-05-04 NOTE — ED Notes (Signed)
Ortho tech at bedside 

## 2018-05-04 NOTE — ED Notes (Signed)
Ortho tech contacted by Dorothea Ogle EDPA

## 2018-05-04 NOTE — ED Notes (Addendum)
Pt reports stepping in a hole tonight, twisting her R ankle.  Swelling and slight bruising noted on her foot anteriorly.  Pt has neuropathy.  She is unable to bear weight

## 2018-05-04 NOTE — ED Notes (Signed)
During dc instruction with rx pain med, she states vicodin prescribed did not work for her in the past. States "if it didn't work for my toe, it's definitely not going to work nowFederal[facility] EDPA made aware.

## 2018-05-07 DIAGNOSIS — S92341A Displaced fracture of fourth metatarsal bone, right foot, initial encounter for closed fracture: Secondary | ICD-10-CM | POA: Diagnosis not present

## 2018-05-07 DIAGNOSIS — S92321A Displaced fracture of second metatarsal bone, right foot, initial encounter for closed fracture: Secondary | ICD-10-CM | POA: Diagnosis not present

## 2018-05-07 DIAGNOSIS — S92331A Displaced fracture of third metatarsal bone, right foot, initial encounter for closed fracture: Secondary | ICD-10-CM | POA: Diagnosis not present

## 2018-05-10 ENCOUNTER — Encounter: Payer: Self-pay | Admitting: Podiatry

## 2018-05-10 ENCOUNTER — Ambulatory Visit: Payer: Medicaid Other | Admitting: Podiatry

## 2018-05-10 ENCOUNTER — Encounter

## 2018-05-10 ENCOUNTER — Telehealth: Payer: Self-pay | Admitting: *Deleted

## 2018-05-10 DIAGNOSIS — S92331A Displaced fracture of third metatarsal bone, right foot, initial encounter for closed fracture: Secondary | ICD-10-CM

## 2018-05-10 DIAGNOSIS — R609 Edema, unspecified: Secondary | ICD-10-CM

## 2018-05-10 DIAGNOSIS — S92901A Unspecified fracture of right foot, initial encounter for closed fracture: Secondary | ICD-10-CM

## 2018-05-10 NOTE — Telephone Encounter (Signed)
[employer] - Medicaid requires clinicals to review for pre-cert CT right foot without contrast 73700, Service Order: [id number], [place] clinicals 05/03/2018 and x-ray reports 05/03/2018 and 05/04/2018, Dr. Leigh Aurora clinicals, orders and demographics of 05/10/2018 faxed to [facility].

## 2018-05-10 NOTE — Progress Notes (Signed)
Subjective: 47 year old female presents the office today for concerns of fracture to the right foot.  She fell resulting in fractures of her second, third, fourth metatarsal bases.  She has been nonweightbearing.  This happened 1 week ago.  She was placed into a splint and given pain medication.  She states that she is still having some discomfort with pain medicine does help.  She is asking about a knee scooter. Denies any systemic complaints such as fevers, chills, nausea, vomiting. No acute changes since last appointment, and no other complaints at this time.   Objective: AAO x3, NAD DP/PT pulses palpable bilaterally, CRT less than 3 seconds Splint intact.  Upon removal there is still moderate to significant swelling to the dorsal aspect of the right foot but the foot is supple.  There is tenderness diffusely along the metatarsal bases where the area the fracture is.  No pain in the toes there is no pain in the ankle.  No significant swelling to the leg. No open lesions or pre-ulcerative lesions.  No pain with calf compression, swelling, warmth, erythema  Assessment: Lisfranc fracture right foot  Plan: -All treatment options discussed with the patient including all alternatives, risks, complications.  -Reviewed the x-rays in the emergency room independently.  There is intra-articular fracture of the third metatarsal base.  Given the multiple fractures and CT scan to evaluate fractures.  She will likely need 6 weeks of nonweightbearing.  Given the swelling I put her into an Unna boot for compression followed by the splint.  We will have her come in on Wednesday for a cast if the swelling has improved.  Continue to ice and elevate.  I will order knee scooter for her. -Patient encouraged to call the office with any questions, concerns, change in symptoms.   Trula Slade [name]

## 2018-05-11 NOTE — Telephone Encounter (Signed)
[facility] - MEDICAID NOTIFICATION CT SCAN [id number] RIGHT FOOT IS APPROVED, AUTHORIZATION NUMBER:  [id number], EFFECTIVE DATE:  05/10/2018, END DATE: 06/09/2018. Faxed to [place] with orders.

## 2018-05-11 NOTE — Telephone Encounter (Signed)
Left message informing pt her CT SCAN had been approved and faxed to [facility] and she could contact them to schedule [phone].

## 2018-05-16 ENCOUNTER — Ambulatory Visit (INDEPENDENT_AMBULATORY_CARE_PROVIDER_SITE_OTHER): Payer: Medicaid Other | Admitting: Podiatry

## 2018-05-16 ENCOUNTER — Ambulatory Visit
Admission: RE | Admit: 2018-05-16 | Discharge: 2018-05-16 | Disposition: A | Discharge status: Home / Self Care | Payer: Medicaid Other | Source: Ambulatory Visit | Attending: Podiatry | Admitting: Podiatry

## 2018-05-16 DIAGNOSIS — S92911P Unspecified fracture of right toe(s), subsequent encounter for fracture with malunion: Secondary | ICD-10-CM

## 2018-05-16 DIAGNOSIS — S92901A Unspecified fracture of right foot, initial encounter for closed fracture: Secondary | ICD-10-CM

## 2018-05-16 DIAGNOSIS — S92901D Unspecified fracture of right foot, subsequent encounter for fracture with routine healing: Secondary | ICD-10-CM

## 2018-05-16 DIAGNOSIS — S92311A Displaced fracture of first metatarsal bone, right foot, initial encounter for closed fracture: Secondary | ICD-10-CM | POA: Diagnosis not present

## 2018-05-16 MED ORDER — OXYCODONE-ACETAMINOPHEN 5-325 MG PO TABS: 1.0000 | ORAL_TABLET | Freq: Four times a day (QID) | ORAL | 0 refills | Status: DC | PRN

## 2018-05-19 NOTE — Progress Notes (Signed)
Subjective: Patient presents the office today for evaluation of possible cast application.  She states she is doing well but she is out of pain medicine she is asking for refill today she still gets discomfort.  Denies any recent injury or falls.  She is remained in the splint.  You can try ice and elevate as well no recent injury or changes since her last saw her. Denies any systemic complaints such as fevers, chills, nausea, vomiting. No acute changes since last appointment, and no other complaints at this time.   Objective: AAO x3, NAD DP/PT pulses palpable bilaterally, CRT less than 3 seconds There is still some mild swelling to the dorsal aspect of the right foot.  The swelling overall is much improved.  There is no erythema warmth there is no open lesions identified at this time.  Diffuse tenderness in the midfoot.  No open lesions or pre-ulcerative lesions.  No pain with calf compression, swelling, warmth, erythema  Assessment: Metatarsal fractures right foot  Plan: -All treatment options discussed with the patient including all alternatives, risks, complications.  -I reviewed the CT scan with her.  I do recommend immobilization for 6 weeks at least.  Today cast was applied make sure to pad all bony prominences.  Nonweightbearing.  Refill pain medication for her today.  Ice elevation. -Patient encouraged to call the office with any questions, concerns, change in symptoms.   *X-ray rightfoot next appointment  Trula Slade [name]

## 2018-05-21 ENCOUNTER — Telehealth: Payer: Self-pay | Admitting: Podiatry

## 2018-05-21 ENCOUNTER — Other Ambulatory Visit: Payer: Self-pay | Admitting: Podiatry

## 2018-05-21 MED ORDER — OXYCODONE-ACETAMINOPHEN 5-325 MG PO TABS: 1.0000 | ORAL_TABLET | Freq: Four times a day (QID) | ORAL | 0 refills | Status: DC | PRN

## 2018-05-21 NOTE — Progress Notes (Signed)
Refilled pain medication

## 2018-05-21 NOTE — Telephone Encounter (Signed)
I informed pt Dr. Jacqualyn Posey had escribed the pain medication.

## 2018-05-21 NOTE — Telephone Encounter (Signed)
I was calling to see if Dr. Jacqualyn Posey would call me some more pain medication in to last until I see him this Friday. If you would call me as soon as possible at [phone]. Thank you

## 2018-05-21 NOTE — Telephone Encounter (Signed)
I sent this to the pharmacy.  ?

## 2018-05-25 ENCOUNTER — Ambulatory Visit (INDEPENDENT_AMBULATORY_CARE_PROVIDER_SITE_OTHER): Payer: Medicaid Other

## 2018-05-25 ENCOUNTER — Ambulatory Visit: Payer: Medicaid Other | Admitting: Podiatry

## 2018-05-25 DIAGNOSIS — S92901D Unspecified fracture of right foot, subsequent encounter for fracture with routine healing: Secondary | ICD-10-CM | POA: Diagnosis not present

## 2018-05-25 DIAGNOSIS — S92811D Other fracture of right foot, subsequent encounter for fracture with routine healing: Secondary | ICD-10-CM

## 2018-05-29 NOTE — Progress Notes (Signed)
Subjective: 47 year old female presents the office today for follow-up evaluation of multiple fractures to her right foot.  She states her pain is improving.  She is been wearing the cast nonweightbearing without any issues.  Denies any recent injury or falls or any changes. Denies any systemic complaints such as fevers, chills, nausea, vomiting. No acute changes since last appointment, and no other complaints at this time.   Objective: AAO x3, NAD DP/PT pulses palpable bilaterally, CRT less than 3 seconds Cast intact and not causing any irritation.  Upon removal there is no open lesions or pre-ulcerative lesions.  Decreased swelling to the dorsal aspect the foot but still present.  There is decreased tenderness to palpation along the areas of the fracture.  No pain with ankle.  No open lesions or pre-ulcerative lesions.  No pain with calf compression, swelling, warmth, erythema  Assessment: Multiple fractures right foot  Plan: -All treatment options discussed with the patient including all alternatives, risks, complications.  -X-rays were obtained reviewed.  Radiolucencies present on the second, third, fourth metatarsals.  Lisfranc joint appears to be intact. -Today new well-padded below-knee fiberglass cast was applied making sure to pad all bony prominences. -Continue nonweightbearing -Ice and elevation -Pain medication as needed. -Patient encouraged to call the office with any questions, concerns, change in symptoms.   [name] [name]

## 2018-05-30 ENCOUNTER — Telehealth: Payer: Self-pay | Admitting: *Deleted

## 2018-05-30 ENCOUNTER — Other Ambulatory Visit: Payer: Self-pay | Admitting: Podiatry

## 2018-05-30 MED ORDER — OXYCODONE-ACETAMINOPHEN 5-325 MG PO TABS: 1.0000 | ORAL_TABLET | Freq: Four times a day (QID) | ORAL | 0 refills | Status: DC | PRN

## 2018-05-30 NOTE — Telephone Encounter (Signed)
Yes this is [name] again. I'm really needing some pain medicine called in. My foot is almost unbearable. I see Dr. Jacqualyn Posey. If there is any kind of way you can get some kind of pain medicine, preferably the one I've been taking in today, it would be great. Please try to get it in today. I don't have an appointment until Monday, so if you could give me enough to last until Monday it would be really great. This pain is almost unbearable in my foot and I would appreciate if y'all could get a call in today. I would appreciate a call back to know what's going on. My number is [phone]. Thank you.

## 2018-05-30 NOTE — Telephone Encounter (Signed)
Refilled pain medication

## 2018-05-30 NOTE — Telephone Encounter (Signed)
Dr. Jacqualyn Posey states he will send the percocet for pt check if the cast is too tight and have continue with ice behind the knee and elevation, come in tomorrow or tonight if the cast is too tight.

## 2018-05-30 NOTE — Telephone Encounter (Signed)
Pt called states she has blood bone in her foot and it hurts very bad and she needs her pain medication.

## 2018-05-30 NOTE — Telephone Encounter (Signed)
Left message informing Dr. Jacqualyn Posey of pt's 3 calls within 30 minutes.

## 2018-05-30 NOTE — Telephone Encounter (Signed)
Pt called states she did not get pain medication offered at the last appt, but she needs it now. Pt sounded like she had been crying.

## 2018-05-30 NOTE — Telephone Encounter (Signed)
Pt states she is up more yesterday at her work cookout at the [employer] and may have put more pressure on it and she thinks she fell up the steps at the game. I told pt Dr. Jacqualyn Posey would call in the pain medication in about 1 hour and we scheduled her for an appt tomorrow at 4:00pm since she fell.

## 2018-05-31 ENCOUNTER — Ambulatory Visit (INDEPENDENT_AMBULATORY_CARE_PROVIDER_SITE_OTHER): Payer: Medicaid Other

## 2018-05-31 ENCOUNTER — Encounter: Payer: Self-pay | Admitting: Podiatry

## 2018-05-31 ENCOUNTER — Ambulatory Visit: Payer: Medicaid Other | Admitting: Podiatry

## 2018-05-31 DIAGNOSIS — S99921A Unspecified injury of right foot, initial encounter: Secondary | ICD-10-CM | POA: Diagnosis not present

## 2018-05-31 DIAGNOSIS — S92901D Unspecified fracture of right foot, subsequent encounter for fracture with routine healing: Secondary | ICD-10-CM

## 2018-06-04 NOTE — Progress Notes (Signed)
Subjective: 47 year old female presents the office today for follow-up evaluation of multiple fractures to her right foot.  She presents today for an acute appointment as she thinks she may have fallen but she is also to work picnic and she had her foot hanging down for longer period of time resulted in swelling and increased pain. Denies any systemic complaints such as fevers, chills, nausea, vomiting. No acute changes since last appointment, and no other complaints at this time.   Objective: AAO x3, NAD DP/PT pulses palpable bilaterally, CRT less than 3 seconds Cast intact and not causing any irritation to the skin area there is still some mild swelling to the dorsal aspect of the foot however does appear to be about the same compared to last appointment.  There is decreased tenderness to palpation along the area of the fracture compared to last appointment.  There is no other areas of tenderness identified.  No proximal pain. No pain with calf compression, swelling, warmth, erythema  Assessment: Multiple fractures right foot  Plan: -All treatment options discussed with the patient including all alternatives, risks, complications.  -X-rays were obtained reviewed.  Radiolucencies present on the second, third, fourth metatarsals.  Lisfranc joint appears to be intact.  No new fracture identified -A well-padded below-knee fiberglass cast was applied making sure to pad all bony prominences. -Continue nonweightbearing -Ice and elevation -Pain medication as needed.  This was refilled yesterday -Patient encouraged to call the office with any questions, concerns, change in symptoms.   Trula Slade [name]

## 2018-06-05 ENCOUNTER — Telehealth: Payer: Self-pay | Admitting: Podiatry

## 2018-06-05 ENCOUNTER — Other Ambulatory Visit: Payer: Self-pay | Admitting: Podiatry

## 2018-06-05 MED ORDER — HYDROCODONE-ACETAMINOPHEN 5-325 MG PO TABS: 1.0000 | ORAL_TABLET | Freq: Four times a day (QID) | ORAL | 0 refills | Status: DC | PRN

## 2018-06-05 NOTE — Telephone Encounter (Signed)
I called and left a message earlier for Lattie Haw, Dr. Leigh Aurora assistant. I really need to get a message to Dr. Jacqualyn Posey to let him know my situation at this point. I would really appreciate a call back before 5 pm today preferably from [place]. [phone]. Thank you.

## 2018-06-05 NOTE — Telephone Encounter (Signed)
Pt trying to have medication refilled, Oxycodone. Per. Patient she is suppose to ask for [place] [facility] or Dr. Jacqualyn Posey.

## 2018-06-06 NOTE — Telephone Encounter (Signed)
Called and spoke with the patient yesterday afternoon and Per Dr Jacqualyn Posey was sending over vicodin to the patient's pharmacy. Lattie Haw

## 2018-06-11 ENCOUNTER — Ambulatory Visit (INDEPENDENT_AMBULATORY_CARE_PROVIDER_SITE_OTHER): Payer: Medicaid Other | Admitting: Podiatry

## 2018-06-11 DIAGNOSIS — S92811S Other fracture of right foot, sequela: Secondary | ICD-10-CM

## 2018-06-11 DIAGNOSIS — S92901D Unspecified fracture of right foot, subsequent encounter for fracture with routine healing: Secondary | ICD-10-CM

## 2018-06-11 MED ORDER — HYDROCODONE-ACETAMINOPHEN 5-325 MG PO TABS: 1.0000 | ORAL_TABLET | Freq: Four times a day (QID) | ORAL | 0 refills | Status: DC | PRN

## 2018-06-11 NOTE — Progress Notes (Signed)
Subjective: 47 year old female presents the office today for follow-up evaluation of multiple fractures to her right foot.  Overall she states that she is doing better.  She has pain most in the morning she first gets up at nighttime.  She is asking for refill of pain medicine as she did run out over the weekend.  She presents today for cast change and she has no other concerns today. Denies any systemic complaints such as fevers, chills, nausea, vomiting. No acute changes since last appointment, and no other complaints at this time.   Objective: AAO x3, NAD DP/PT pulses palpable bilaterally, CRT less than 3 seconds Cast intact and not causing any irritation.  Upon removal there is no open lesions identified.  There is no significant tenderness palpation the foot or ankle.  There is still some mild swelling to the foot but overall is improved.  There is no erythema or warmth.  No other areas of tenderness. No pain with calf compression, swelling, warmth, erythema  Assessment: Multiple fractures right foot  Plan: -All treatment options discussed with the patient including all alternatives, risks, complications.  -To the cast removed without complications.  New cast was reapplied make sure to pad all bony prominences.  Continue ice elevate.  Refilled Vicodin for her.  I will see her back in 2 weeks and repeat x-rays at that point likely start to transition to a cam boot and partial weightbearing. -Patient encouraged to call the office with any questions, concerns, change in symptoms.   Trula Slade [name]

## 2018-06-15 ENCOUNTER — Ambulatory Visit: Payer: Medicaid Other | Admitting: Podiatry

## 2018-06-18 ENCOUNTER — Telehealth: Payer: Self-pay | Admitting: Podiatry

## 2018-06-18 ENCOUNTER — Other Ambulatory Visit: Payer: Self-pay | Admitting: Podiatry

## 2018-06-18 MED ORDER — OXYCODONE-ACETAMINOPHEN 5-325 MG PO TABS: 1.0000 | ORAL_TABLET | Freq: Four times a day (QID) | ORAL | 0 refills | Status: DC | PRN

## 2018-06-18 NOTE — Telephone Encounter (Addendum)
I informed pt of Dr. Leigh Aurora orders and she states understanding. Pt states she fell on her crutches and cracked cast. I told pt we would have schedulers call to set up appt for tomorrow. Pt states she can't get to her cam boot.

## 2018-06-18 NOTE — Telephone Encounter (Signed)
I called pt, asked to describe the pain is across the top of the foot and is worse in the morning, pt states she has neuropathy and has to take 2 hydrocodone 3 times a day for it to help the pain.

## 2018-06-18 NOTE — Telephone Encounter (Signed)
I sent percocet to her pharmacy. She may need to come in to have the cast removed. She can come in this week and when she comes in bring the CAM boot as she will likely go into this.

## 2018-06-18 NOTE — Telephone Encounter (Signed)
I need some more pain medication and if he can switch it back to what it was at first. The hydrocodone is not doing anything anymore. Can someone please call me back before 5:00 pm.

## 2018-06-19 NOTE — Telephone Encounter (Signed)
Left message informing pt that we were trying to get her in today to be seen as I had discussed with her yesterday, but the schedulers had been extremely busy, but we would get her in to be seen and to discuss her pain medication with Dr. Jacqualyn Posey.

## 2018-06-19 NOTE — Telephone Encounter (Signed)
Left voicemail for pt to call us back and schedule an appointment for today to see Dr. Jacqualyn Posey and/or the nurse.

## 2018-06-19 NOTE — Telephone Encounter (Signed)
Left message for pt to call to get an appt today an appt today to discuss pain medication, cast removal and cam walker.

## 2018-06-19 NOTE — Telephone Encounter (Signed)
I have not received a call back about me getting something for pain as I ran out on Friday and went through the weekend without anything. My foot is broken and I don't understand why I can't get anything for pain. Can somebody please please call me back.

## 2018-06-20 NOTE — Telephone Encounter (Signed)
Pt called back 06/20/2018 and is scheduled with Gretta Arab, RN 1:15pm on 06/21/2018.

## 2018-06-21 ENCOUNTER — Ambulatory Visit (INDEPENDENT_AMBULATORY_CARE_PROVIDER_SITE_OTHER): Payer: Medicaid Other

## 2018-06-21 ENCOUNTER — Ambulatory Visit (INDEPENDENT_AMBULATORY_CARE_PROVIDER_SITE_OTHER): Payer: Medicaid Other | Admitting: Podiatry

## 2018-06-21 DIAGNOSIS — Z720 Tobacco use: Secondary | ICD-10-CM | POA: Diagnosis not present

## 2018-06-21 DIAGNOSIS — S99921D Unspecified injury of right foot, subsequent encounter: Secondary | ICD-10-CM

## 2018-06-21 DIAGNOSIS — S92901D Unspecified fracture of right foot, subsequent encounter for fracture with routine healing: Secondary | ICD-10-CM | POA: Diagnosis not present

## 2018-06-21 DIAGNOSIS — S92901G Unspecified fracture of right foot, subsequent encounter for fracture with delayed healing: Secondary | ICD-10-CM

## 2018-06-21 DIAGNOSIS — R609 Edema, unspecified: Secondary | ICD-10-CM

## 2018-06-21 DIAGNOSIS — G6289 Other specified polyneuropathies: Secondary | ICD-10-CM

## 2018-06-21 MED ORDER — HYDROCODONE-ACETAMINOPHEN 5-325 MG PO TABS: 1.0000 | ORAL_TABLET | Freq: Four times a day (QID) | ORAL | 0 refills | Status: DC | PRN

## 2018-06-21 NOTE — Progress Notes (Signed)
Subjective:  Patient ID: [name], female    DOB: October 21, 1970,  MRN: [id number]  Chief Complaint  Patient presents with  . Fracture    Rt foot, cast removed " my foot feels about the same as it did at the last visit"     47 y.o. female presents with the above complaint.  Requesting refill of her pain medication today states that the pain continues and overall is not noticing much improvement.  Endorses smoking  Date of injury 05/03/2018  Review of Systems: Negative except as noted in the HPI. Denies N/V/F/Ch.  Past Medical History:  Diagnosis Date  . History of alcoholism ([place])   . Hypertension   . Malnutrition ([place])   . Peripheral neuropathy   . Strachan's syndrome     Current Outpatient Medications:  .  amLODipine (NORVASC) 5 MG tablet, Take 1 tablet (5 mg total) by mouth daily., Disp: 30 tablet, Rfl: 6 .  DULoxetine (CYMBALTA) 60 MG capsule, TAKE 1 CAPSULE BY MOUTH 2 TIMES DAILY., Disp: 60 capsule, Rfl: 6 .  hydrALAZINE (APRESOLINE) 10 MG tablet, Take 1 tablet (10 mg total) by mouth 3 (three) times daily., Disp: 90 tablet, Rfl: 6 .  HYDROcodone-acetaminophen (NORCO/VICODIN) 5-325 MG tablet, Take 1 tablet by mouth every 6 (six) hours as needed., Disp: 15 tablet, Rfl: 0 .  meloxicam (MOBIC) 7.5 MG tablet, Take 1 tablet (7.5 mg total) by mouth daily., Disp: 30 tablet, Rfl: 1 .  methocarbamol (ROBAXIN) 500 MG tablet, Take 1 tablet (500 mg total) by mouth 2 (two) times daily as needed for muscle spasms., Disp: 60 tablet, Rfl: 1 .  Multiple Vitamins-Minerals (MULTIVITAMIN WITH MINERALS) tablet, Take 1 tablet by mouth daily., Disp: , Rfl:  .  omeprazole (PRILOSEC) 20 MG capsule, Take 1 capsule (20 mg total) by mouth daily., Disp: 30 capsule, Rfl: 6 .  oxyCODONE-acetaminophen (PERCOCET/ROXICET) 5-325 MG tablet, Take 1 tablet by mouth every 6 (six) hours as needed for severe pain., Disp: 15 tablet, Rfl: 0 .  potassium chloride (K-DUR,KLOR-CON) 10 MEQ tablet, Take 10 mEq by mouth  daily., Disp: , Rfl:  .  pregabalin (LYRICA) 100 MG capsule, Take 1 capsule (100 mg total) by mouth 3 (three) times daily., Disp: 90 capsule, Rfl: 5 .  traZODone (DESYREL) 100 MG tablet, Take 1 tablet (100 mg total) by mouth at bedtime as needed for sleep., Disp: 30 tablet, Rfl: 6  Social History   Tobacco Use  Smoking Status Current Every Day Smoker  . Packs/day: 0.50  . Years: 10.00  . Pack years: 5.00  Smokeless Tobacco Never Used    Allergies  Allergen Reactions  . Macrobid [Nitrofurantoin Macrocrystal] Anaphylaxis and Other (See Comments)    Patient reports 2 syncope episodes after her mouth became numb   Objective:  There were no vitals filed for this visit. There is no height or weight on file to calculate BMI. Constitutional Well developed. Well nourished.  Vascular Dorsalis pedis pulses palpable bilaterally. Posterior tibial pulses palpable bilaterally. Capillary refill normal to all digits.  No cyanosis or clubbing noted. Pedal hair growth normal.  Neurologic Normal speech. Oriented to person, place, and time. Epicritic sensation to light touch grossly present bilaterally.  Dermatologic Nails well groomed and normal in appearance. No open wounds. No skin lesions.  Orthopedic: Normal joint ROM without pain or crepitus bilaterally. No visible deformities. Pain to palpation about the metatarsal bases right foot Edema right forefoot   Radiographs: Incomplete consolidation of the fractures about the metatarsal bases.  Continued fracture evident of the second proximal phalanx base. Assessment:   1. Closed fracture of right foot with delayed healing, subsequent encounter   2. Injury of right foot, subsequent encounter   3. Tobacco abuse   4. Swelling   5. Other polyneuropathy    Plan:  Patient was evaluated and treated and all questions answered.  Multiple closed fractures right foot, concern for delayed healing, tobacco abuse and neuropathy contributing  factors -X-rays reviewed as above.  Though I do see signs of healing I do not believe the fracture to fully heal. -As she is now about 7 weeks from injury I would consider this delayed healing -Reapply short leg cast today.  Patient did not like the fit of the first cast as she was concerned too much of her toes were showing, and the cast was cut,removed and reapplied.  A total of 8 rolls of fiberglass casting was used -Follow-up in 2 weeks for repeat cast with Dr. Jacqualyn Posey -Pain medication refilled per patient request.  Would not consider further refills -Discussed continued icing and elevating of the foot for pain reduction.  No follow-ups on file.

## 2018-06-29 ENCOUNTER — Ambulatory Visit: Payer: Medicaid Other | Admitting: Podiatry

## 2018-07-06 ENCOUNTER — Ambulatory Visit (INDEPENDENT_AMBULATORY_CARE_PROVIDER_SITE_OTHER): Payer: Medicaid Other

## 2018-07-06 ENCOUNTER — Ambulatory Visit (INDEPENDENT_AMBULATORY_CARE_PROVIDER_SITE_OTHER): Payer: Medicaid Other | Admitting: Podiatry

## 2018-07-06 DIAGNOSIS — S92901D Unspecified fracture of right foot, subsequent encounter for fracture with routine healing: Secondary | ICD-10-CM | POA: Diagnosis not present

## 2018-07-06 DIAGNOSIS — S92901G Unspecified fracture of right foot, subsequent encounter for fracture with delayed healing: Secondary | ICD-10-CM | POA: Diagnosis not present

## 2018-07-08 NOTE — Progress Notes (Signed)
Subjective: 47 year old female presents the office today for follow-up evaluation of multiple fractures to her right foot.  She states that she was doing well she was having no pain is so the callus was removed she should have some swelling.  Overall she is been doing well.  She is entrance the operative foot as much as possible.  Ice and elevate it. Denies any systemic complaints such as fevers, chills, nausea, vomiting. No acute changes since last appointment, and no other complaints at this time.   Objective: AAO x3, NAD DP/PT pulses palpable bilaterally, CRT less than 3 seconds Mild swelling to the dorsal aspect the right foot still.  There is no erythema increased warmth.  There is no significant tenderness palpation along the area the previous fracture.  No open lesions or pre-ulcerative lesions.  No pain with calf compression, swelling, warmth, erythema  Assessment: Multiple metatarsal fractures right foot  Plan: -All treatment options discussed with the patient including all alternatives, risks, complications.  -X-rays were obtained and reviewed with lucencies are present most and will second metatarsal base.  There is evidence of healing across the fracture site however. -Also been observed in boot nonweightbearing, she was hesitancy because of alk phos.  A well-padded fiberglass below-knee cast was applied making sure to pad all bony problems.  Continue nonweightbearing.  Continue ice and elevation. -Patient encouraged to call the office with any questions, concerns, change in symptoms.   *X-ray next appointment and likely transition to unna boot/CAM [place] [name]

## 2018-07-20 ENCOUNTER — Ambulatory Visit: Payer: Medicaid Other | Admitting: Podiatry

## 2018-07-23 ENCOUNTER — Ambulatory Visit (INDEPENDENT_AMBULATORY_CARE_PROVIDER_SITE_OTHER): Payer: Medicaid Other

## 2018-07-23 ENCOUNTER — Ambulatory Visit (INDEPENDENT_AMBULATORY_CARE_PROVIDER_SITE_OTHER): Payer: Medicaid Other | Admitting: Podiatry

## 2018-07-23 ENCOUNTER — Ambulatory Visit: Payer: Medicaid Other | Admitting: Family Medicine

## 2018-07-23 ENCOUNTER — Encounter: Payer: Self-pay | Admitting: Podiatry

## 2018-07-23 DIAGNOSIS — S92901D Unspecified fracture of right foot, subsequent encounter for fracture with routine healing: Secondary | ICD-10-CM

## 2018-07-25 DIAGNOSIS — S92901A Unspecified fracture of right foot, initial encounter for closed fracture: Secondary | ICD-10-CM | POA: Insufficient documentation

## 2018-07-25 NOTE — Progress Notes (Signed)
Subjective: 47 year old female presents the office today for follow-up evaluation of multiple fractures to her right foot.  She sustained the injury on May 03, 2018.  She has been in the cast and she states that her pain is improved.  She takes occasional pain medicine but overall she is doing much better.  She has been nonweightbearing. Denies any systemic complaints such as fevers, chills, nausea, vomiting. No acute changes since last appointment, and no other complaints at this time.   Objective: AAO x3, NAD DP/PT pulses palpable bilaterally, CRT less than 3 seconds Mild but improved swelling to the dorsal aspect of the right foot.  There is no significant tenderness palpation on the areas of fractures.  There is no open lesions.  Dry skin is present. No pain with calf compression, swelling, warmth, erythema  Assessment: Multiple metatarsal fractures right foot  Plan: -All treatment options discussed with the patient including all alternatives, risks, complications.  -X-rays were obtained and reviewed with lucencies are present most and will second and third metatarsal base.  There is some evidence of healing however radiolucent line still evident. -At this point or a transition to a cam boot.  She has a bone stimulator at home that she states that she has from the toe that she never really used.  On her to start to use this daily as directed.  As she starts to feel better she can start to transition to weightbearing as tolerated in a cam boot.  Wear cam boot at all times.  Continue to ice and elevate.  Return in about 3 weeks (around 08/13/2018).  Trula Slade [name]

## 2018-07-26 ENCOUNTER — Telehealth: Payer: Self-pay | Admitting: Podiatry

## 2018-07-26 NOTE — Telephone Encounter (Signed)
Pt was put in a boot on Monday after being in a cast. Patient called requesting that she have some pain medication prescribed. Please give patient a call.

## 2018-07-26 NOTE — Telephone Encounter (Signed)
Pt states she is having a soreness across the top of the foot now that she is in a boot rather than the cast and is taking the percocet every 12 hours.

## 2018-07-27 ENCOUNTER — Other Ambulatory Visit: Payer: Self-pay | Admitting: Podiatry

## 2018-07-27 MED ORDER — OXYCODONE-ACETAMINOPHEN 5-325 MG PO TABS: 1.0000 | ORAL_TABLET | Freq: Three times a day (TID) | ORAL | 0 refills | Status: DC | PRN

## 2018-07-27 NOTE — Telephone Encounter (Signed)
I sent it to her pharmacy listed in epic. [facility] let me know if she has any issues getting it. Thanks.

## 2018-08-07 ENCOUNTER — Other Ambulatory Visit: Payer: Self-pay | Admitting: Podiatry

## 2018-08-07 ENCOUNTER — Telehealth: Payer: Self-pay | Admitting: Podiatry

## 2018-08-07 MED ORDER — HYDROCODONE-ACETAMINOPHEN 5-325 MG PO TABS: 1.0000 | ORAL_TABLET | Freq: Four times a day (QID) | ORAL | 0 refills | Status: DC | PRN

## 2018-08-07 NOTE — Telephone Encounter (Signed)
I refilled the pain medication but if she is having severe pain she needs to come in to be seen tomorrow.

## 2018-08-07 NOTE — Telephone Encounter (Signed)
Unable to leave a message to check pt's pain status, phone rang over 1 minute and then hung up.

## 2018-08-07 NOTE — Telephone Encounter (Signed)
Pt called requesting that she have some pain medication prescribed. Had cast removed and has been walking in boot but is experiencing severe pain.

## 2018-08-07 NOTE — Progress Notes (Signed)
I refilled pain medication

## 2018-08-08 NOTE — Telephone Encounter (Signed)
Left message informing pt Dr. Jacqualyn Posey had refilled the pain medication, and if she is having severe pain,Dr. Jacqualyn Posey would like her to make an appt 08/08/2018, and our office had a 2:15pm and 4:15pm appt available, but she would need to call to be scheduled.

## 2018-08-12 ENCOUNTER — Telehealth: Payer: Self-pay | Admitting: Podiatry

## 2018-08-12 NOTE — Telephone Encounter (Signed)
This patient called the office stating she was in severe pain due to previous foot fractures since she was very active yesterday.  She was diagnosed with metatarsal fractures and is being treated by Dr.  Jacqualyn Posey.  She was last seen on 07/23/18 by Dr.  Jacqualyn Posey.  She was prescribed on 11/15 by Dr.  Jacqualyn Posey for percocet.  She then was prescribed on 11/26 for vicodin.   She says the medicine she has been prescribed is ineffective.  She calls to the office requesting additional medicine for her pain.  I told her we are limited with prescribing opiates .  She then requested Dr.  Jacqualyn Posey phone number.  I told her she needs to ice elevate and rest her foot to allow her swollen foot to subside.  [facility] [address]  She was prescribed # 30  100 mg.  If pain persists she is to call the office on Monday.  Gardiner Barefoot [name]

## 2018-08-13 ENCOUNTER — Ambulatory Visit (INDEPENDENT_AMBULATORY_CARE_PROVIDER_SITE_OTHER): Payer: Medicaid Other

## 2018-08-13 ENCOUNTER — Encounter: Payer: Self-pay | Admitting: Podiatry

## 2018-08-13 ENCOUNTER — Ambulatory Visit: Payer: Medicaid Other | Admitting: Podiatry

## 2018-08-13 DIAGNOSIS — S92901D Unspecified fracture of right foot, subsequent encounter for fracture with routine healing: Secondary | ICD-10-CM | POA: Diagnosis not present

## 2018-08-13 DIAGNOSIS — S92901G Unspecified fracture of right foot, subsequent encounter for fracture with delayed healing: Secondary | ICD-10-CM

## 2018-08-13 MED ORDER — OXYCODONE-ACETAMINOPHEN 5-325 MG PO TABS: 1.0000 | ORAL_TABLET | Freq: Three times a day (TID) | ORAL | 0 refills | Status: DC | PRN

## 2018-08-15 NOTE — Progress Notes (Signed)
Subjective: 47 year old female presents the office today for follow-up evaluation of multiple fractures to her right foot.  Overall she states that she is doing well.  She does get discomfort but she is been wearing the cam boot at all times.  She is asking for refill of pain medication.  She has been using the bone stimulator but she thinks that it may need to be reset.  She denies any recent injury or falls or any changes.  She was doing well however over the Thanksgiving holiday she is on her feet quite a bit and she thinks this aggravated the symptoms.Denies any systemic complaints such as fevers, chills, nausea, vomiting. No acute changes since last appointment, and no other complaints at this time.   Objective: AAO x3, NAD DP/PT pulses palpable bilaterally, CRT less than 3 seconds Mild swelling on the dorsal aspect of the right foot.  There is minimal tenderness palpation along the midfoot but there is no specific area of tenderness.  There is no erythema or increase in warmth there is no fluctuation or open sores. No pain with calf compression, swelling, warmth, erythema  Assessment: Multiple metatarsal fractures right foot, with delayed union  Plan: -All treatment options discussed with the patient including all alternatives, risks, complications.  -X-rays were obtained and reviewed with lucencies are present most and will second and third metatarsal base.  There is delayed union across the fracture site -Continue cam boot at all times.  Ice and elevation.  Refill pain medication for her today.  When to contact the company that she get the bone stimulator from this evening to be reset.  Return in about 3 weeks (around 09/03/2018).  X-ray next appointment  Trula Slade [name]

## 2018-08-27 ENCOUNTER — Ambulatory Visit: Payer: Medicaid Other | Admitting: Family Medicine

## 2018-08-29 DIAGNOSIS — H5213 Myopia, bilateral: Secondary | ICD-10-CM | POA: Diagnosis not present

## 2018-09-03 ENCOUNTER — Other Ambulatory Visit: Payer: Self-pay | Admitting: Podiatry

## 2018-09-03 ENCOUNTER — Ambulatory Visit (INDEPENDENT_AMBULATORY_CARE_PROVIDER_SITE_OTHER): Payer: Medicaid Other | Admitting: Podiatry

## 2018-09-03 ENCOUNTER — Ambulatory Visit (INDEPENDENT_AMBULATORY_CARE_PROVIDER_SITE_OTHER): Payer: Medicaid Other

## 2018-09-03 DIAGNOSIS — S92301A Fracture of unspecified metatarsal bone(s), right foot, initial encounter for closed fracture: Secondary | ICD-10-CM

## 2018-09-03 DIAGNOSIS — S92591P Other fracture of right lesser toe(s), subsequent encounter for fracture with malunion: Secondary | ICD-10-CM

## 2018-09-03 DIAGNOSIS — S92901G Unspecified fracture of right foot, subsequent encounter for fracture with delayed healing: Secondary | ICD-10-CM

## 2018-09-03 DIAGNOSIS — S92311K Displaced fracture of first metatarsal bone, right foot, subsequent encounter for fracture with nonunion: Secondary | ICD-10-CM | POA: Diagnosis not present

## 2018-09-03 DIAGNOSIS — R609 Edema, unspecified: Secondary | ICD-10-CM

## 2018-09-03 DIAGNOSIS — M79671 Pain in right foot: Secondary | ICD-10-CM

## 2018-09-03 MED ORDER — OXYCODONE-ACETAMINOPHEN 5-325 MG PO TABS: 1.0000 | ORAL_TABLET | Freq: Three times a day (TID) | ORAL | 0 refills | Status: DC | PRN

## 2018-09-13 ENCOUNTER — Telehealth: Payer: Self-pay | Admitting: *Deleted

## 2018-09-13 ENCOUNTER — Other Ambulatory Visit: Payer: Self-pay | Admitting: Podiatry

## 2018-09-13 NOTE — Telephone Encounter (Signed)
Called and spoke with the patient stating that Dr Jacqualyn Posey wanted the patient to get Vitamin D blood work done due to the healing progress in the patient's foot is not healing like it should and patient stated that she was making an appointment with the PCP and would get the blood work done and I stated to call the [place] office if any concerns or questions. Lattie Haw

## 2018-09-13 NOTE — Telephone Encounter (Signed)
Called and left a message for the patient to call me back today and I left our [place] office number [phone]. Lattie Haw

## 2018-09-13 NOTE — Progress Notes (Signed)
Subjective: 48 year old female presents the office today for follow-up evaluation of multiple fractures to her right foot.  She states overall she is feeling somewhat better, she still gets some discomfort with wearing the boot she still gets swelling.  She denies any recent injury or falls or any changes since last saw her otherwise.  She is still using the bone stimulator daily. Denies any systemic complaints such as fevers, chills, nausea, vomiting. No acute changes since last appointment, and no other complaints at this time.   Objective: AAO x3, NAD DP/PT pulses palpable bilaterally, CRT less than 3 seconds Mild swelling on the dorsal aspect of the right foot which continues.  There is still tenderness palpation along the midfoot mostly on the second metatarsal base.  Overall tenderness has improved but does continue.  There is no erythema or increase in warmth there is no fluctuation or open sores. No pain with calf compression, swelling, warmth, erythema  Assessment: Multiple metatarsal fractures right foot, with delayed union  Plan: -All treatment options discussed with the patient including all alternatives, risks, complications.  -X-rays were obtained and reviewed with lucencies are present most and will second and third metatarsal base.  There is delayed union across the fracture site -Check Vitamin D level  -Continue cam boot at all times.  Ice and elevation.  Refilled pain medication for her today.    Return in about 3 weeks (around 09/24/2018). x-ray next appointment   Trula Slade [name]

## 2018-09-14 MED ORDER — OXYCODONE-ACETAMINOPHEN 5-325 MG PO TABS: 1.0000 | ORAL_TABLET | Freq: Three times a day (TID) | ORAL | 0 refills | Status: DC | PRN

## 2018-09-14 NOTE — Telephone Encounter (Signed)
Pt complains of sharp pains in the foot.

## 2018-09-17 ENCOUNTER — Other Ambulatory Visit: Payer: Self-pay | Admitting: Neurology

## 2018-09-17 DIAGNOSIS — H5213 Myopia, bilateral: Secondary | ICD-10-CM | POA: Diagnosis not present

## 2018-09-18 ENCOUNTER — Other Ambulatory Visit: Payer: Self-pay | Admitting: Neurology

## 2018-09-19 ENCOUNTER — Telehealth: Payer: Self-pay

## 2018-09-19 NOTE — Telephone Encounter (Signed)
Rcvd Rx refill request from [facility] for Lyrica 100 mg. Dr. Posey Pronto refilled yesterday, Rx was not rcvd by pharmacy. I called and spoke with Lanelle Bal, gave verbal for #90 TID no additional refills.

## 2018-09-24 ENCOUNTER — Ambulatory Visit (INDEPENDENT_AMBULATORY_CARE_PROVIDER_SITE_OTHER): Payer: Medicaid Other

## 2018-09-24 ENCOUNTER — Ambulatory Visit (INDEPENDENT_AMBULATORY_CARE_PROVIDER_SITE_OTHER): Payer: Medicaid Other | Admitting: Podiatry

## 2018-09-24 DIAGNOSIS — S92901G Unspecified fracture of right foot, subsequent encounter for fracture with delayed healing: Secondary | ICD-10-CM

## 2018-09-24 DIAGNOSIS — H52203 Unspecified astigmatism, bilateral: Secondary | ICD-10-CM | POA: Diagnosis not present

## 2018-09-24 MED ORDER — OXYCODONE-ACETAMINOPHEN 5-325 MG PO TABS: 1.0000 | ORAL_TABLET | Freq: Three times a day (TID) | ORAL | 0 refills | Status: DC | PRN

## 2018-09-25 ENCOUNTER — Other Ambulatory Visit: Payer: Self-pay | Admitting: Podiatry

## 2018-09-25 ENCOUNTER — Telehealth: Payer: Self-pay | Admitting: *Deleted

## 2018-09-25 DIAGNOSIS — S92901G Unspecified fracture of right foot, subsequent encounter for fracture with delayed healing: Secondary | ICD-10-CM

## 2018-09-25 LAB — VITAMIN D 25 HYDROXY (VIT D DEFICIENCY, FRACTURES): Vit D, 25-Hydroxy: 20 ng/mL — ABNORMAL LOW (ref 30–100)

## 2018-09-25 MED ORDER — VITAMIN D (ERGOCALCIFEROL) 1.25 MG (50000 UNIT) PO CAPS: 50000.0000 [IU] | ORAL_CAPSULE | ORAL | 0 refills | Status: DC

## 2018-09-25 NOTE — Telephone Encounter (Signed)
Faxed orders to [facility]. 

## 2018-09-25 NOTE — Telephone Encounter (Signed)
-----   Message from Trula Slade, [name] sent at 09/24/2018  2:28 PM EST ----- Can you please order a CT scan of the right foot. She had fractures metatarsal 1-5 several months ago and still having pian and serial x-rays are not showing any improvement. Thanks.

## 2018-09-25 NOTE — Progress Notes (Signed)
Subjective: 48 year old female presents the office today for follow-up evaluation of multiple fractures to her right foot.  She states that overall she is feeling somewhat better the swelling is somewhat improved however she still gets pain to the top of her foot.  She is continued with the bone stimulator.  She has tried going without the boot and going barefoot which does aggravate her symptoms.  She denies any recent injury or falls or any changes since I last saw her she has no other concerns today. Denies any systemic complaints such as fevers, chills, nausea, vomiting. No acute changes since last appointment, and no other complaints at this time.   Objective: AAO x3, NAD DP/PT pulses palpable bilaterally, CRT less than 3 seconds Mild swelling on the dorsal aspect of the right foot which continues although somewhat improved.  There is still tenderness palpation along the midfoot mostly on the second metatarsal base.  Overall she is feeling better than when we started but she still gets tenderness which is residual.  There is no erythema or increase in warmth there is no fluctuation or open sores. No pain with calf compression, swelling, warmth, erythema  Assessment: Multiple metatarsal fractures right foot, with nonunion  Plan: -All treatment options discussed with the patient including all alternatives, risks, complications.  -X-rays were obtained and reviewed with lucencies are present most and will second and third metatarsal base.  There is nonunion across the fracture site at the second and third metatarsal -Check Vitamin D level -she did not get this checked and will recheck it.  Paper was given. -Given the nature of nonhealing fractures I am ordering a CT scan for evaluation to evaluate healing as there is nonunion.  She is been immobilized as well as using the bone stimulator.  Continue ice elevate and limit activity.  Trula Slade [name]

## 2018-09-25 NOTE — Telephone Encounter (Signed)
done

## 2018-09-26 NOTE — Telephone Encounter (Signed)
[facility] - MEDICAID AUTHORIZATION:  [id number] FOR [id number] RIGHT FOOT, EFFECTIVE 09/26/2018, END 10/26/2018. Faxed authorization and orders to [place] [facility].

## 2018-09-27 ENCOUNTER — Telehealth: Payer: Self-pay | Admitting: *Deleted

## 2018-09-27 DIAGNOSIS — S92901G Unspecified fracture of right foot, subsequent encounter for fracture with delayed healing: Secondary | ICD-10-CM

## 2018-09-27 NOTE — Telephone Encounter (Signed)
-----   Message from Trula Slade, [name] sent at 09/25/2018  7:01 AM EST ----- Val- please let her know that her vitamin D level is low. [facility] vitmain d that she is to take 1 time a week. Will recheck in 1 month.

## 2018-09-27 NOTE — Telephone Encounter (Signed)
I called pt and informed of Dr. Leigh Aurora review of results and orders. I told pt I would mail her a copy of the labs as a reminder to perform after 4 weeks of the Vitamin D. Mailed labs to pt.

## 2018-09-28 ENCOUNTER — Other Ambulatory Visit: Payer: Medicaid Other

## 2018-09-28 NOTE — Progress Notes (Signed)
Follow-up Visit   Date: 10/01/18    [name] MRN: [id number] DOB: [birth year]   Interim History: [name] is a 48 y.o. 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  The patient was accompanied to the clinic by self.  History of present illness: She started having tingling and weakness of the legs in 04-Dec-2011 and eventually sought medical attention in December 03, 2013 because of progressive worsening pain and weakness of the legs and malnutrition.  She was hospitalized and had extensive neurological work-up including CSF analysis, serology testing, and sural nerve biopsy.  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   She was diagnosed with alcoholic and nutritional deficiency neuropathy and was seeing Dr. [name] at [facility] for follow-up.  Her husband passed away in 12/04/2011 and she became depressed, which led to her drinking alcohol.  She has been sober since this time.    She noticed gradual improvement over the following year where she was able to walk again.  She continues stumble often because of lack of sensation and imbalance, but walks unassisted.  She has tingling and burning sensation involving the lower legs, especially from the knee down and the fingertips.  She was prescribed gabapentin '600mg'$  TID, previously she was taking '600mg'$  fives times per day.  She did not take the gabapentin as prescribed and was taking gabapentin '600mg'$  at 7am and  2pm, and '1200mg'$  at 10pm. She also takes Cymbalta '60mg'$  twice daily.  She has been noncompliant with multivitamin and folic acid.  She was seeing Dr. [name] at [facility] since 12-03-2013 and was discharged in 12-04-15 due to too many no shows.   She quit drinking alcohol in 12-04-14.  She was consuming 7-8 beers daily x 3 years and has been sober since then.  UPDATE  09/11/2017: At her last visit, she was found to have vitamin B1, vitamin B12, and folate deficiency and started on supplementation.  She is compliant with vitamin B12, but does not take folic acid or thiamine.  Her boyfriend states that she is eating much better and has gained 20lb.  She has started to drink socially, about twice per month.  She had one fall and fractured her right 2nd toe, requiring right foot surgery in September.  She has recovered well from this.  Regarding her neuropathy, she continues to have imbalance and burning pain of the lower legs and feet.  PCP switched her gabapentin to Lyrica '75mg'$  in September and would like the dose increased a little.    UPDATE 10/01/2018:  She is here for follow-up visit.  She reports improved control of pain with increasing Lyrica to '100mg'$  TID.  She continues to have constant numbness of the lower legs.  She sprained her ankle in August and broke her right foot, which is taking time to heal.  She is followed by podiatry and wears a boot.  She is compliant with supplementation and admits that she continues to drink on the weekends (2-3 beers).     Medications:  Current Outpatient Medications on File Prior to Visit  Medication Sig Dispense Refill  . amLODipine (NORVASC) 5 MG tablet Take 1 tablet (5 mg total) by mouth daily. 30 tablet 6  . DULoxetine (CYMBALTA) 60 MG capsule TAKE 1 CAPSULE BY MOUTH 2 TIMES DAILY. 60 capsule 6  . hydrALAZINE (APRESOLINE) 10 MG tablet Take 1 tablet (  10 mg total) by mouth 3 (three) times daily. 90 tablet 6  . HYDROcodone-acetaminophen (NORCO/VICODIN) 5-325 MG tablet Take 1 tablet by mouth every 6 (six) hours as needed. 15 tablet 0  . meloxicam (MOBIC) 7.5 MG tablet Take 1 tablet (7.5 mg total) by mouth daily. 30 tablet 1  . methocarbamol (ROBAXIN) 500 MG tablet Take 1 tablet (500 mg total) by mouth 2 (two) times daily as needed for muscle spasms. 60 tablet 1  . Multiple Vitamins-Minerals (MULTIVITAMIN WITH MINERALS) tablet  Take 1 tablet by mouth daily.    Marland Kitchen omeprazole (PRILOSEC) 20 MG capsule Take 1 capsule (20 mg total) by mouth daily. 30 capsule 6  . oxyCODONE-acetaminophen (PERCOCET/ROXICET) 5-325 MG tablet Take 1 tablet by mouth every 8 (eight) hours as needed for severe pain. 10 tablet 0  . potassium chloride (K-DUR,KLOR-CON) 10 MEQ tablet Take 10 mEq by mouth daily.    . pregabalin (LYRICA) 100 MG capsule TAKE 1 CAPSULE BY MOUTH THREE TIMES A DAY 90 capsule 5  . traZODone (DESYREL) 100 MG tablet Take 1 tablet (100 mg total) by mouth at bedtime as needed for sleep. 30 tablet 6  . Vitamin D, Ergocalciferol, (DRISDOL) 1.25 MG (50000 UT) CAPS capsule Take 1 capsule (50,000 Units total) by mouth every 7 (seven) days. 5 capsule 0   No current facility-administered medications on file prior to visit.     Allergies:  Allergies  Allergen Reactions  . Macrobid [Nitrofurantoin Macrocrystal] Anaphylaxis and Other (See Comments)    Patient reports 2 syncope episodes after her mouth became numb    Review of Systems:  CONSTITUTIONAL: No fevers, chills, night sweats, or weight loss.  EYES: No visual changes or eye pain ENT: No hearing changes.  No history of nose bleeds.   RESPIRATORY: No cough, wheezing and shortness of breath.   CARDIOVASCULAR: Negative for chest pain, and palpitations.   GI: Negative for abdominal discomfort, blood in stools or black stools.  No recent change in bowel habits.   GU:  No history of incontinence.   MUSCLOSKELETAL: +history of joint pain or swelling.  No myalgias.   SKIN: Negative for lesions, rash, and itching.   ENDOCRINE: Negative for cold or heat intolerance, polydipsia or goiter.   PSYCH:  No depression or anxiety symptoms.   NEURO: As Above.   Vital Signs:  BP 110/80   Pulse 70   Ht '5\' 5"'$  (1.651 m)   Wt 145 lb 6 oz (65.9 kg)   SpO2 98%   BMI 24.19 kg/m    General Medical Exam:   General:  Well appearing, comfortable  Eyes/ENT: see cranial nerve examination.     Neck: No masses appreciated.  Full range of motion without tenderness.  No carotid bruits. Respiratory:  Clear to auscultation, good air entry bilaterally.   Cardiac:  Regular rate and rhythm, no murmur.   Ext:  No edema, right foot is supported in a boot  Neurological Exam: MENTAL STATUS including orientation to time, place, person, recent and remote memory, attention span and concentration, language, and fund of knowledge is normal.  Speech is not dysarthric.  CRANIAL NERVES:  Pupils equal round and reactive to light.  Normal conjugate, extra-ocular eye movements in all directions of gaze.  No ptosis.  Face is symmetric. Palate elevates symmetrically.  Tongue is midline.  MOTOR:  Motor strength is 5/5 in all extremities.  No atrophy, fasciculations or abnormal movements.  No pronator drift.  Tone is normal.    MSRs:  Right                                                                 Left brachioradialis 2+  brachioradialis 2+  biceps 2+  biceps 2+  triceps 2+  triceps 2+  patellar 0  Patellar tr  ankle jerk 0  ankle jerk 0   SENSORY:  Intact to vibration is intact at the hands, reduced distal to knees and absent at the ankles bilaterally.  COORDINATION/GAIT:   Gait appears asymmetric due to right foot boot, unassisted and stable.   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 EMG evaluation of the right lower extremity was relatively unremarkable. Labs 12/2013:  ANA negative. RPR nonreactive, HIV nonreactive, ESR only at 5, rheumatoid factor negative, copper 127, Vitamin B12 at 381. Heavy metal screen is neg, SPEP no M protein CSF 12/24/2013:  CSF R1 W0 G60 P34  Labs 11/16/2016:  Vitamin B12 119*, vitamin B1 < 7, MMA 216, folate 2.2* Labs 09/11/2017:  Vitamin B12 1042, folate 23.6, vitamin B1 7*  IMPRESSION/PLAN: 1.  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  She has reduced her alcohol consumption to weekends only and reports drinking 2-3 beers.  Again, I advised her to completely abstain from alcohol to avoid progression of neuropathy. Her painful paresthesias are controlled on combination of Lyrica '100mg'$  TID and Cymbalta '60mg'$  BID.  2.  Nutritional deficiencies.  Continue vitamin B12 1048mg daily and vitamin B1 '100mg'$  daily.  Check vitamin B12, folate, and B1 level.   3.  Tobacco abuse.   Currently smoking 1 packs/day.  Patient was informed of the dangers of tobacco abuse including stroke, cancer, and MI, as well as benefits of tobacco cessation.  Patient is willing to quit at this time.  Approximately 5 mins were spent counseling patient cessation techniques. We discussed various methods to help quit smoking, including deciding on a date to quit, joining a support group, pharmacological agents- nicotine gum/patch/lozenges, chantix.  I will reassess her progress at the next follow-up visit  Return to clinic in 1 year  Thank you for allowing me to participate in patient's care.  If I can answer any additional questions, I would be pleased to do so.    Sincerely,    Marshall Kampf K. PPosey Pronto DO

## 2018-09-29 ENCOUNTER — Other Ambulatory Visit: Payer: Self-pay | Admitting: Podiatry

## 2018-09-29 ENCOUNTER — Encounter: Payer: Self-pay | Admitting: Podiatry

## 2018-09-30 ENCOUNTER — Other Ambulatory Visit: Payer: Self-pay | Admitting: Podiatry

## 2018-09-30 MED ORDER — OXYCODONE-ACETAMINOPHEN 5-325 MG PO TABS: 1.0000 | ORAL_TABLET | Freq: Three times a day (TID) | ORAL | 0 refills | Status: DC | PRN

## 2018-10-01 ENCOUNTER — Encounter: Payer: Self-pay | Admitting: Neurology

## 2018-10-01 ENCOUNTER — Ambulatory Visit: Payer: Medicaid Other | Admitting: Neurology

## 2018-10-01 ENCOUNTER — Other Ambulatory Visit (INDEPENDENT_AMBULATORY_CARE_PROVIDER_SITE_OTHER): Payer: Medicaid Other

## 2018-10-01 VITALS — BP 110/80 | HR 70 | Ht 65.0 in | Wt 145.4 lb

## 2018-10-01 DIAGNOSIS — E538 Deficiency of other specified B group vitamins: Secondary | ICD-10-CM

## 2018-10-01 DIAGNOSIS — G589 Mononeuropathy, unspecified: Secondary | ICD-10-CM

## 2018-10-01 DIAGNOSIS — G6289 Other specified polyneuropathies: Secondary | ICD-10-CM

## 2018-10-01 DIAGNOSIS — Z720 Tobacco use: Secondary | ICD-10-CM

## 2018-10-01 DIAGNOSIS — E5111 Dry beriberi: Secondary | ICD-10-CM

## 2018-10-01 LAB — VITAMIN B12: Vitamin B-12: 352 pg/mL (ref 211–911)

## 2018-10-01 LAB — FOLATE: Folate: 6.4 ng/mL (ref >5.9)

## 2018-10-01 NOTE — Patient Instructions (Addendum)
Continue Cymbalta 60mg  twice daily and Lyrica 100mg  three times daily  Try to stop smoking and abstain for alcohol  Return to clinic 1 year

## 2018-10-03 ENCOUNTER — Ambulatory Visit
Admission: RE | Admit: 2018-10-03 | Discharge: 2018-10-03 | Disposition: A | Discharge status: Home / Self Care | Payer: Medicaid Other | Source: Ambulatory Visit | Attending: Podiatry | Admitting: Podiatry

## 2018-10-03 DIAGNOSIS — S92901G Unspecified fracture of right foot, subsequent encounter for fracture with delayed healing: Secondary | ICD-10-CM

## 2018-10-03 DIAGNOSIS — S92333A Displaced fracture of third metatarsal bone, unspecified foot, initial encounter for closed fracture: Secondary | ICD-10-CM | POA: Diagnosis not present

## 2018-10-04 LAB — VITAMIN B1: Vitamin B1 (Thiamine): 8 nmol/L (ref 8–30)

## 2018-10-05 ENCOUNTER — Telehealth: Payer: Self-pay | Admitting: *Deleted

## 2018-10-05 ENCOUNTER — Encounter: Payer: Self-pay | Admitting: *Deleted

## 2018-10-05 NOTE — Telephone Encounter (Signed)
Results and instructions sent via My Chart.  

## 2018-10-05 NOTE — Telephone Encounter (Signed)
-----   Message from Trula Slade, [name] sent at 10/05/2018  7:30 AM EST ----- Val- Pldase let her know that the second and third metatarsal fractures have not healed. Continue CAM boot, bone stimulator and have her follow up with me. She needs to continue the vitamin D as it was low which is a contributing factor as well as the smoking. Please let her know the results. Thank you.

## 2018-10-05 NOTE — Telephone Encounter (Signed)
Left message informing pt of Dr. Leigh Aurora review of results and orders, appt line [phone] or [phone].

## 2018-10-05 NOTE — Telephone Encounter (Signed)
-----   Message from Alda Berthold, DO sent at 10/04/2018  3:53 PM EST ----- Please inform patient that her vitamin levels are low-normal.  Continue supplements - folic acid 1mg  daily, B1 100mg  daily, and B12 1075mcg daily.  Thanks.

## 2018-10-08 ENCOUNTER — Telehealth: Payer: Self-pay | Admitting: Podiatry

## 2018-10-08 ENCOUNTER — Other Ambulatory Visit: Payer: Self-pay | Admitting: Podiatry

## 2018-10-08 MED ORDER — OXYCODONE-ACETAMINOPHEN 5-325 MG PO TABS: 1.0000 | ORAL_TABLET | Freq: Three times a day (TID) | ORAL | 0 refills | Status: DC | PRN

## 2018-10-08 NOTE — Telephone Encounter (Signed)
Pt has sent a my chart message to Dr. Jacqualyn Posey for a refill on her pain medication. She is in a lot of pain. She would like it refilled today.

## 2018-10-08 NOTE — Telephone Encounter (Signed)
I sent it over

## 2018-10-14 ENCOUNTER — Other Ambulatory Visit: Payer: Self-pay | Admitting: Family Medicine

## 2018-10-14 DIAGNOSIS — G6289 Other specified polyneuropathies: Secondary | ICD-10-CM

## 2018-10-14 DIAGNOSIS — K219 Gastro-esophageal reflux disease without esophagitis: Secondary | ICD-10-CM

## 2018-10-14 DIAGNOSIS — I10 Essential (primary) hypertension: Secondary | ICD-10-CM

## 2018-10-16 ENCOUNTER — Encounter: Payer: Self-pay | Admitting: Podiatry

## 2018-10-16 ENCOUNTER — Other Ambulatory Visit: Payer: Self-pay | Admitting: Sports Medicine

## 2018-10-16 ENCOUNTER — Other Ambulatory Visit: Payer: Self-pay | Admitting: Podiatry

## 2018-10-16 ENCOUNTER — Telehealth: Payer: Self-pay | Admitting: *Deleted

## 2018-10-16 MED ORDER — OXYCODONE-ACETAMINOPHEN 5-325 MG PO TABS: 1.0000 | ORAL_TABLET | Freq: Three times a day (TID) | ORAL | 0 refills | Status: DC | PRN

## 2018-10-16 NOTE — Telephone Encounter (Signed)
Patient called and stated that she would like to have a refill of pain medicine and per Dr Jacqualyn Posey stated that patient could get a refill of oxycodone (Percocet/Roxicet) 5-325 mg tablet and 10 quantity and called patient back and stated that the pain medicine is at the pharmacy and to call if any concerns or questions at [phone]. Lattie Haw

## 2018-10-16 NOTE — Progress Notes (Signed)
Rx from Dr. Jacqualyn Posey is in chart for Percocet ordered today -DrlStover

## 2018-10-19 ENCOUNTER — Telehealth: Payer: Self-pay | Admitting: Neurology

## 2018-10-19 ENCOUNTER — Other Ambulatory Visit: Payer: Self-pay | Admitting: Neurology

## 2018-10-19 NOTE — Telephone Encounter (Signed)
Refill on the Lyrica medication to the [facility] on file. Thanks!

## 2018-10-22 ENCOUNTER — Ambulatory Visit: Payer: Medicaid Other | Admitting: Family Medicine

## 2018-10-22 ENCOUNTER — Telehealth: Payer: Self-pay | Admitting: Podiatry

## 2018-10-22 ENCOUNTER — Other Ambulatory Visit: Payer: Self-pay | Admitting: Podiatry

## 2018-10-22 ENCOUNTER — Encounter: Payer: Self-pay | Admitting: Podiatry

## 2018-10-22 ENCOUNTER — Other Ambulatory Visit: Payer: Self-pay | Admitting: Neurology

## 2018-10-22 MED ORDER — HYDROCODONE-ACETAMINOPHEN 5-325 MG PO TABS: 1.0000 | ORAL_TABLET | Freq: Four times a day (QID) | ORAL | 0 refills | Status: DC | PRN

## 2018-10-22 NOTE — Telephone Encounter (Signed)
5 refills that you called in for her lyrica are not there. [facility] on [address]. Please send these in again. Thanks!

## 2018-10-22 NOTE — Telephone Encounter (Signed)
Pt called requesting that Dr. Leigh Aurora nurse give her a call. Pt asked for Filippa. Please give pt a call back.

## 2018-10-23 ENCOUNTER — Ambulatory Visit (INDEPENDENT_AMBULATORY_CARE_PROVIDER_SITE_OTHER): Payer: Medicaid Other | Admitting: Podiatry

## 2018-10-23 DIAGNOSIS — L603 Nail dystrophy: Secondary | ICD-10-CM

## 2018-10-23 DIAGNOSIS — S92811P Other fracture of right foot, subsequent encounter for fracture with malunion: Secondary | ICD-10-CM

## 2018-10-23 DIAGNOSIS — S92901G Unspecified fracture of right foot, subsequent encounter for fracture with delayed healing: Secondary | ICD-10-CM

## 2018-10-23 DIAGNOSIS — L600 Ingrowing nail: Secondary | ICD-10-CM

## 2018-10-23 DIAGNOSIS — M79675 Pain in left toe(s): Secondary | ICD-10-CM

## 2018-10-23 DIAGNOSIS — E559 Vitamin D deficiency, unspecified: Secondary | ICD-10-CM

## 2018-10-23 NOTE — Telephone Encounter (Signed)
Spoke with Dr Jacqualyn Posey yesterday and per Dr Jacqualyn Posey would refill the pain medicine 10/22/2018. Lattie Haw

## 2018-10-23 NOTE — Telephone Encounter (Signed)
[facility] called [facility] and they have not received the Rx.  Called in new Rx for patient.

## 2018-10-23 NOTE — Patient Instructions (Signed)

## 2018-10-25 DIAGNOSIS — E559 Vitamin D deficiency, unspecified: Secondary | ICD-10-CM | POA: Insufficient documentation

## 2018-10-25 NOTE — Progress Notes (Signed)
Subjective: 48 year old female presents the office for follow-up evaluation of a fracture of her right foot.  She states that overall she is doing better and her pain is actually improved.  She states that it starts to feel more normal like her left foot.  She is still in the cam boot.  She has not had any swelling to the right foot she denies any recent injury or trauma any changes in the last saw her.  She also presents today requesting for her left big toenail to be removed as it grew back and is thick and discolored and causes pain.  Denies any redness or drainage or any swelling to the toenail site.  She has no other concerns today. Denies any systemic complaints such as fevers, chills, nausea, vomiting. No acute changes since last appointment.  Objective: AAO x3, NAD DP/PT pulses palpable bilaterally, CRT less than 3 seconds At this time there is no significant discomfort to palpation of the dorsal aspect of the right foot along the second third metatarsal specifically over the area of the fracture.  There is no other areas of tenderness.  Very minimal edema compared to the contralateral extremity. On the left side the hallux toenails are hypertrophic, dystrophic with yellow discoloration is loose from the underlying nail bed distally.  There is subjective tenderness in the left hallux toenail. No open lesions or pre-ulcerative lesions.  No pain with calf compression, swelling, warmth, erythema  Assessment: Nonunion right metatarsal fracture; left hallux onychodystrophy  Plan: -All treatment options discussed with the patient including all alternatives, risks, complications.  -Reviewed the CT scan results with her on the right foot.  Overall she is actually looking clinically good improvement.  Continue the cam boot for now.  We discussed that over time we could maybe try to get back into a shoe with a graphite insert.  She also hold off any surgical intervention.  We will recheck a vitamin D  level.  This is ordered today.  Continue with bone stimulator as well. -At this time, the patient is requesting total nail removal with chemical matricectomy to the symptomatic portion of the nail. Risks and complications were discussed with the patient for which they understand and written consent was obtained. Under sterile conditions a total of 3 mL of a mixture of 2% lidocaine plain and 0.5% Marcaine plain was infiltrated in a hallux block fashion. Once anesthetized, the skin was prepped in sterile fashion. A tourniquet was then applied. Next the left hallux nail was then sharply excised making sure to remove the entire offending nail border. Once the nails were ensured to be removed area was debrided and the underlying skin was intact. There is no purulence identified in the procedure. Next phenol was then applied under standard conditions and copiously irrigated. Silvadene was applied. A dry sterile dressing was applied. After application of the dressing the tourniquet was removed and there is found to be an immediate capillary refill time to the digit. The patient tolerated the procedure well any complications. Post procedure instructions were discussed the patient for which he verbally understood. Follow-up in 2 weeks for nail check or sooner if any problems are to arise. Discussed signs/symptoms of infection and directed to call the office immediately should any occur or go directly to the emergency room. In the meantime, encouraged to call the office with any questions, concerns, changes symptoms. -Patient encouraged to call the office with any questions, concerns, change in symptoms.   Return in about 2 weeks (  around 11/06/2018).  Trula Slade [name]

## 2018-10-30 ENCOUNTER — Encounter: Payer: Self-pay | Admitting: Podiatry

## 2018-10-30 ENCOUNTER — Other Ambulatory Visit: Payer: Self-pay | Admitting: Podiatry

## 2018-10-30 MED ORDER — HYDROCODONE-ACETAMINOPHEN 5-325 MG PO TABS: 1.0000 | ORAL_TABLET | Freq: Four times a day (QID) | ORAL | 0 refills | Status: DC | PRN

## 2018-11-02 ENCOUNTER — Encounter: Payer: Self-pay | Admitting: Podiatry

## 2018-11-05 ENCOUNTER — Other Ambulatory Visit: Payer: Self-pay | Admitting: Podiatry

## 2018-11-05 MED ORDER — HYDROCODONE-ACETAMINOPHEN 5-325 MG PO TABS: 1.0000 | ORAL_TABLET | Freq: Four times a day (QID) | ORAL | 0 refills | Status: DC | PRN

## 2018-11-06 ENCOUNTER — Ambulatory Visit: Payer: Medicaid Other | Admitting: Podiatry

## 2018-11-12 ENCOUNTER — Encounter: Payer: Self-pay | Admitting: Podiatry

## 2018-11-12 ENCOUNTER — Ambulatory Visit: Payer: Medicaid Other | Admitting: Podiatry

## 2018-11-12 DIAGNOSIS — L603 Nail dystrophy: Secondary | ICD-10-CM

## 2018-11-12 DIAGNOSIS — S92901G Unspecified fracture of right foot, subsequent encounter for fracture with delayed healing: Secondary | ICD-10-CM

## 2018-11-12 MED ORDER — HYDROCODONE-ACETAMINOPHEN 5-325 MG PO TABS: 1.0000 | ORAL_TABLET | Freq: Three times a day (TID) | ORAL | 0 refills | Status: DC | PRN

## 2018-11-13 ENCOUNTER — Other Ambulatory Visit: Payer: Self-pay | Admitting: Family Medicine

## 2018-11-13 DIAGNOSIS — G6289 Other specified polyneuropathies: Secondary | ICD-10-CM

## 2018-11-13 DIAGNOSIS — I10 Essential (primary) hypertension: Secondary | ICD-10-CM

## 2018-11-13 DIAGNOSIS — K219 Gastro-esophageal reflux disease without esophagitis: Secondary | ICD-10-CM

## 2018-11-14 NOTE — Progress Notes (Signed)
Subjective: 48 year old female presents the office for follow-up evaluation of a fracture of her right foot and for nail check on the left big toe.  She said the big toe is been doing well.  She still been soaking she denies any drainage or pus coming from the area no pain.  On the right foot she is back into wearing a regular shoe.  She gets some discomfort at the top of the toe but overall she is been doing better.  She takes some pain medication occasionally at the end of the day.  She denies any recent injury or trauma any changes since I last saw her otherwise.  No new concerns.  Objective: AAO x3, NAD DP/PT pulses palpable bilaterally, CRT less than 3 seconds At this time there is minimal discomfort to palpation of the dorsal aspect of the right foot along the second third metatarsal specifically over the area of the fracture.  Decreased healing.  She is back to regular shoe.  On the left foot status post total nail avulsion left hallux toenail procedure site appears to be healed only small scab is present.  There is no surrounding erythema, ascending cellulitis there is no fluctuation crepitation any drainage or pus or any clinical signs of infection. No other open lesions or pre-ulcerative lesions. No open lesions or pre-ulcerative lesions.  No pain with calf compression, swelling, warmth, erythema  Assessment: Nonunion right metatarsal fracture; left hallux onychodystrophy follow-up after total nail avulsion  Plan: -All treatment options discussed with the patient including all alternatives, risks, complications.  -Regards to her right foot she is doing better.  We again discussed the CT scan results as well as the nonunion, she is back to wearing a shoe when she is feeling comfortable wearing this.  She is now purchase new shoes.  Discussed with a graphite insert inside of her shoes.  I will see her back next appointment take a new x-ray.  In regards to the left foot this is doing well and  appears to be healed.  I would continue keeping antibiotic ointment and bandage up in the day and continue soaking for the next week.  Monitor for any signs or signs of infection. -I did refill pain medicine but she states that she is only taking it as needed and has decreased amount that she is taking.  *Recheck vitamin D level  Trula Slade [name]

## 2018-11-15 ENCOUNTER — Other Ambulatory Visit: Payer: Self-pay | Admitting: Family Medicine

## 2018-11-15 ENCOUNTER — Encounter: Payer: Self-pay | Admitting: Family Medicine

## 2018-11-15 DIAGNOSIS — I10 Essential (primary) hypertension: Secondary | ICD-10-CM

## 2018-11-15 NOTE — Telephone Encounter (Signed)
Patient called to request a refill on their medication, they were instructed to hang up and contact their pharmacy directly to order their prescriptions even if they are out of refills.  She want her BP medications because she made an appointment

## 2018-11-16 MED ORDER — AMLODIPINE BESYLATE 5 MG PO TABS: 5.0000 mg | ORAL_TABLET | Freq: Every day | ORAL | 0 refills | Status: DC

## 2018-11-16 MED ORDER — HYDRALAZINE HCL 10 MG PO TABS: 10.0000 mg | ORAL_TABLET | Freq: Three times a day (TID) | ORAL | 0 refills | Status: DC

## 2018-11-17 ENCOUNTER — Other Ambulatory Visit: Payer: Self-pay | Admitting: Family Medicine

## 2018-11-17 DIAGNOSIS — G6289 Other specified polyneuropathies: Secondary | ICD-10-CM

## 2018-11-19 ENCOUNTER — Ambulatory Visit: Payer: Medicaid Other | Attending: Family Medicine | Admitting: Family Medicine

## 2018-11-19 ENCOUNTER — Encounter: Payer: Self-pay | Admitting: Family Medicine

## 2018-11-19 DIAGNOSIS — I10 Essential (primary) hypertension: Secondary | ICD-10-CM | POA: Diagnosis not present

## 2018-11-19 DIAGNOSIS — G6289 Other specified polyneuropathies: Secondary | ICD-10-CM | POA: Diagnosis not present

## 2018-11-19 DIAGNOSIS — G4709 Other insomnia: Secondary | ICD-10-CM

## 2018-11-19 DIAGNOSIS — K219 Gastro-esophageal reflux disease without esophagitis: Secondary | ICD-10-CM | POA: Diagnosis not present

## 2018-11-19 MED ORDER — TRAZODONE HCL 100 MG PO TABS: 100.0000 mg | ORAL_TABLET | Freq: Every evening | ORAL | 6 refills | Status: DC | PRN

## 2018-11-19 MED ORDER — HYDRALAZINE HCL 10 MG PO TABS: 10.0000 mg | ORAL_TABLET | Freq: Three times a day (TID) | ORAL | 6 refills | Status: DC

## 2018-11-19 MED ORDER — DULOXETINE HCL 60 MG PO CPEP: ORAL_CAPSULE | ORAL | 6 refills | Status: DC

## 2018-11-19 MED ORDER — OMEPRAZOLE 20 MG PO CPDR: DELAYED_RELEASE_CAPSULE | ORAL | 6 refills | Status: DC

## 2018-11-19 MED ORDER — AMLODIPINE BESYLATE 5 MG PO TABS: 5.0000 mg | ORAL_TABLET | Freq: Every day | ORAL | 6 refills | Status: DC

## 2018-11-19 NOTE — Patient Instructions (Signed)

## 2018-11-19 NOTE — Progress Notes (Signed)
Subjective:  Patient ID: [name], female    DOB: Dec 06, 1970  Age: 48 y.o. 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 Reflux symptoms are controlled on omeprazole and symptoms return whenever she is off it. Compliant with her antihypertensive and for her neuropathy she has to take Cymbalta combined with Lyrica and still has some residual numbness in both legs below the level of her knees. Also sees Dr. 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 Currently under the care of Dr. Levan Hurst of podiatry due to closed fracture of her right foot with delayed healing; last office visit was last week.  She has been unable to work at her job at the [employer] where she is usually on her feet.  She has been out of work since August of last year.  Currently graduated from wearing a cast in a boot and now wears regular shoes but does experience some right foot pain.  Past Medical History:  Diagnosis Date  . History of alcoholism ([place])   . Hypertension   . Malnutrition ([place])   . Peripheral neuropathy   . Strachan's syndrome     Past Surgical History:  Procedure Laterality Date  . MULTIPLE EXTRACTIONS WITH ALVEOLOPLASTY N/A 12/25/2013   Procedure: Extraction of tooth #'s 2,3,4,5,6,8,9,10,11,12,13,14,15,20,21,22,23,24,25,26,27, [phone] with alveoloplasty and bilateral mandibular tori reductions;  Surgeon: Lenn Cal, DDS;  Location: [facility];  Service: Oral Surgery;  Laterality: N/A;  . SURAL NERVE BX Right 12/24/2013   Procedure: SURAL NERVE BIOPSY;  Surgeon: Kristeen Miss, MD;  Location: [facility];  Service: Neurosurgery;  Laterality: Right;  . TUBAL LIGATION      Family History  Adopted: Yes  Problem Relation Age of Onset  . Diabetes Father   . Hypertension Mother   .  Raynaud syndrome Mother   . Diabetes Maternal Aunt   . Heart disease Maternal Aunt   . Diabetes Maternal Uncle   . Heart disease Maternal Uncle   . Drug abuse Maternal Grandmother   . Cancer Maternal Grandmother        intestinal  . Hypertension Maternal Grandmother   . Cancer Maternal Grandfather        lung  . Hypertension Paternal Grandfather     Allergies  Allergen Reactions  . Macrobid [Nitrofurantoin Macrocrystal] Anaphylaxis and Other (See Comments)    Patient reports 2 syncope episodes after her mouth became numb    Outpatient Medications Prior to Visit  Medication Sig Dispense Refill  . HYDROcodone-acetaminophen (NORCO/VICODIN) 5-325 MG tablet Take 1 tablet by mouth every 8 (eight) hours as needed. 10 tablet 0  . Multiple Vitamins-Minerals (MULTIVITAMIN WITH MINERALS) tablet Take 1 tablet by mouth daily.    . potassium chloride (K-DUR,KLOR-CON) 10 MEQ tablet Take 10 mEq by mouth daily.    . pregabalin (LYRICA) 100 MG capsule TAKE 1 CAPSULE BY MOUTH THREE TIMES A DAY 90 capsule 5  . amLODipine (NORVASC) 5 MG tablet Take 1 tablet (5 mg total) by mouth daily. 30 tablet 0  . DULoxetine (CYMBALTA) 60 MG capsule TAKE 1 CAPSULE BY MOUTH TWICE A DAY 60 capsule 0  . hydrALAZINE (APRESOLINE) 10 MG tablet Take 1 tablet (10 mg total) by mouth 3 (three) times daily. 90 tablet 0  . omeprazole (PRILOSEC) 20 MG capsule TAKE 1  CAPSULE BY MOUTH EVERY DAY 30 capsule 0  . traZODone (DESYREL) 100 MG tablet Take 1 tablet (100 mg total) by mouth at bedtime as needed for sleep. 30 tablet 6  . meloxicam (MOBIC) 7.5 MG tablet Take 1 tablet (7.5 mg total) by mouth daily. (Patient not taking: Reported on 11/19/2018) 30 tablet 1  . methocarbamol (ROBAXIN) 500 MG tablet Take 1 tablet (500 mg total) by mouth 2 (two) times daily as needed for muscle spasms. (Patient not taking: Reported on 11/19/2018) 60 tablet 1  . oxyCODONE-acetaminophen (PERCOCET/ROXICET) 5-325 MG tablet Take 1 tablet by mouth every 8  (eight) hours as needed for severe pain. (Patient not taking: Reported on 11/19/2018) 10 tablet 0  . Vitamin D, Ergocalciferol, (DRISDOL) 1.25 MG (50000 UT) CAPS capsule Take 1 capsule (50,000 Units total) by mouth every 7 (seven) days. (Patient not taking: Reported on 11/19/2018) 5 capsule 0   No facility-administered medications prior to visit.      ROS Review of Systems General: negative for fever, weight loss, appetite change Eyes: no visual symptoms. ENT: no ear symptoms, no sinus tenderness, no nasal congestion or sore throat. Neck: no pain  Respiratory: no wheezing, shortness of breath, cough Cardiovascular: no chest pain, no dyspnea on exertion, no pedal edema, no orthopnea. Gastrointestinal: no abdominal pain, no diarrhea, no constipation Genito-Urinary: no urinary frequency, no dysuria, no polyuria. Hematologic: no bruising Endocrine: no cold or heat intolerance Neurological: no headaches, no seizures, no tremors, + numbness Musculoskeletal: see HPI Skin: no pruritus, no rash. Psychological: no depression, no anxiety,    Objective:  BP 124/77   Pulse 71   Temp 98.3 F (36.8 C) (Oral)   Ht 5' 5" (1.651 m)   Wt 142 lb 12.8 oz (64.8 kg)   SpO2 93%   BMI 23.76 kg/m   BP/Weight 11/19/2018 10/01/2018 4/97/0263  Systolic BP 785 885 027  Diastolic BP 77 80 96  Wt. (Lbs) 142.8 145.38 -  BMI 23.76 24.19 -      Physical Exam Constitutional: normal appearing,  Eyes: PERRLA HEENT: Head is atraumatic, normal sinuses, normal oropharynx, normal appearing tonsils and palate, tympanic membrane is normal bilaterally. Neck: normal range of motion, no thyromegaly, no JVD Cardiovascular: normal rate and rhythm, normal heart sounds, no murmurs, rub or gallop, no pedal edema Respiratory: Normal breath sounds, clear to auscultation bilaterally, no wheezes, no rales, no rhonchi Abdomen: soft, not tender to palpation, normal bowel sounds, no enlarged organs Musculoskeletal: Full ROM, no  tenderness in joints Skin: warm and dry, no lesions. Neurological: alert, oriented x3, cranial nerves I-XII grossly intact , normal motor strength, Paresthesia of dorsum of R foot Psychological: normal mood.   CMP Latest Ref Rng & Units 01/01/2018 05/24/2017 04/07/2017  Glucose 65 - 99 mg/dL 117(H) 114(H) 87  BUN 6 - 24 mg/dL 4(L) 4(L) 8  Creatinine 0.57 - 1.00 mg/dL 0.64 0.61 0.53(L)  Sodium 134 - 144 mmol/L 139 138 138  Potassium 3.5 - 5.2 mmol/L 3.7 4.4 4.4  Chloride 96 - 106 mmol/L 100 99 97  CO2 20 - 29 mmol/L _0 Calcium 8.7 - 10.2 mg/dL 9.0 8.6(L) 9.3  Total Protein 6.0 - 8.5 g/dL 7.0 6.7 -  Total Bilirubin 0.0 - 1.2 mg/dL 0.3 <0.2 -  Alkaline Phos 39 - 117 IU/L 136(H) 135(H) -  AST 0 - 40 IU/L 17 19 -  ALT 0 - 32 IU/L 11 7 -    Lipid Panel     Component Value  Date/Time   CHOL 161 05/24/2017 1149   TRIG 95 05/24/2017 1149   HDL 51 05/24/2017 1149   CHOLHDL 3.2 05/24/2017 1149   CHOLHDL 4.0 07/19/2016 1238   VLDL 26 07/19/2016 1238   LDLCALC 91 05/24/2017 1149    CBC    Component Value Date/Time   WBC 18.3 (H) 02/28/2017 2130   RBC 3.79 (L) 02/28/2017 2130   HGB 13.3 02/28/2017 2130   HGB 11.5 02/28/2017 1616   HCT 37.6 02/28/2017 2130   HCT 36.6 02/28/2017 1616   PLT 372 02/28/2017 2130   PLT 352 02/28/2017 1616   MCV 99.2 02/28/2017 2130   MCV 106 (H) 02/28/2017 1616   MCH 35.1 (H) 02/28/2017 2130   MCHC 35.4 02/28/2017 2130   RDW 15.3 02/28/2017 2130   RDW 15.9 (H) 02/28/2017 1616   LYMPHSABS 2.1 02/28/2017 1616   MONOABS 2.1 (H) 12/18/2013 1540   EOSABS 0.2 02/28/2017 1616   BASOSABS 0.0 02/28/2017 1616    Lab Results  Component Value Date   HGBA1C 4.8 12/20/2013    Assessment & Plan:   1. Gastroesophageal reflux disease, esophagitis presence not specified Controlled Discussed risks and benefit of omeprazole but unable to discontinue PPI due to the fact that reflux symptoms return while off PPI - omeprazole (PRILOSEC) 20 MG capsule; TAKE  1 CAPSULE BY MOUTH EVERY DAY  Dispense: 30 capsule; Refill: 6  2. Essential hypertension Controlled Counseled on blood pressure goal of less than 130/80, low-sodium, DASH diet, medication compliance, 150 minutes of moderate intensity exercise per week. Discussed medication compliance, adverse effects. - amLODipine (NORVASC) 5 MG tablet; Take 1 tablet (5 mg total) by mouth daily.  Dispense: 30 tablet; Refill: 6 - hydrALAZINE (APRESOLINE) 10 MG tablet; Take 1 tablet (10 mg total) by mouth 3 (three) times daily.  Dispense: 90 tablet; Refill: 6  3. Other polyneuropathy Improved on Cymbalta and Lyrica She does experience some residual neuropathy - DULoxetine (CYMBALTA) 60 MG capsule; TAKE 1 CAPSULE BY MOUTH TWICE A DAY  Dispense: 60 capsule; Refill: 6 - Lipid panel - CMP14+EGFR  4. Other insomnia Stable - traZODone (DESYREL) 100 MG tablet; Take 1 tablet (100 mg total) by mouth at bedtime as needed for sleep.  Dispense: 30 tablet; Refill: 6   Meds ordered this encounter  Medications  . omeprazole (PRILOSEC) 20 MG capsule    Sig: TAKE 1 CAPSULE BY MOUTH EVERY DAY    Dispense:  30 capsule    Refill:  6  . amLODipine (NORVASC) 5 MG tablet    Sig: Take 1 tablet (5 mg total) by mouth daily.    Dispense:  30 tablet    Refill:  6  . DULoxetine (CYMBALTA) 60 MG capsule    Sig: TAKE 1 CAPSULE BY MOUTH TWICE A DAY    Dispense:  60 capsule    Refill:  6  . hydrALAZINE (APRESOLINE) 10 MG tablet    Sig: Take 1 tablet (10 mg total) by mouth 3 (three) times daily.    Dispense:  90 tablet    Refill:  6  . traZODone (DESYREL) 100 MG tablet    Sig: Take 1 tablet (100 mg total) by mouth at bedtime as needed for sleep.    Dispense:  30 tablet    Refill:  6    Follow-up: Return in about 6 months (around 05/22/2019) for follow up of chronic medical conditions.       Charlott Rakes, MD, FAAFP. [facility]  [place], [place]   11/19/2018, 9:14 AM

## 2018-11-19 NOTE — Progress Notes (Signed)
REFILLS: all meds.

## 2018-11-20 ENCOUNTER — Other Ambulatory Visit: Payer: Self-pay | Admitting: Family Medicine

## 2018-11-20 LAB — CMP14+EGFR
ALT: 8 IU/L (ref 0–32)
AST: 22 IU/L (ref 0–40)
Albumin/Globulin Ratio: 1.2 (ref 1.2–2.2)
Albumin: 3.5 g/dL — ABNORMAL LOW (ref 3.8–4.8)
Alkaline Phosphatase: 152 IU/L — ABNORMAL HIGH (ref 39–117)
BUN/Creatinine Ratio: 4 — ABNORMAL LOW (ref 9–23)
BUN: 3 mg/dL — ABNORMAL LOW (ref 6–24)
Bilirubin Total: 0.3 mg/dL (ref 0.0–1.2)
CO2: 25 mmol/L (ref 20–29)
Calcium: 8.9 mg/dL (ref 8.7–10.2)
Chloride: 99 mmol/L (ref 96–106)
Creatinine, Ser: 0.7 mg/dL (ref 0.57–1.00)
GFR calc Af Amer: 119 mL/min/{1.73_m2} (ref >59)
GFR calc non Af Amer: 104 mL/min/{1.73_m2} (ref >59)
Globulin, Total: 2.9 g/dL (ref 1.5–4.5)
Glucose: 108 mg/dL — ABNORMAL HIGH (ref 65–99)
Potassium: 3.3 mmol/L — ABNORMAL LOW (ref 3.5–5.2)
Sodium: 142 mmol/L (ref 134–144)
Total Protein: 6.4 g/dL (ref 6.0–8.5)

## 2018-11-20 LAB — LIPID PANEL
Chol/HDL Ratio: 4.8 ratio — ABNORMAL HIGH (ref 0.0–4.4)
Cholesterol, Total: 192 mg/dL (ref 100–199)
HDL: 40 mg/dL (ref >39)
LDL Calculated: 118 mg/dL — ABNORMAL HIGH (ref 0–99)
Triglycerides: 168 mg/dL — ABNORMAL HIGH (ref 0–149)
VLDL Cholesterol Cal: 34 mg/dL (ref 5–40)

## 2018-11-20 MED ORDER — POTASSIUM CHLORIDE CRYS ER 10 MEQ PO TBCR: 10.0000 meq | EXTENDED_RELEASE_TABLET | Freq: Every day | ORAL | 6 refills | Status: DC

## 2018-11-23 ENCOUNTER — Other Ambulatory Visit: Payer: Self-pay | Admitting: Podiatry

## 2018-11-23 ENCOUNTER — Encounter: Payer: Self-pay | Admitting: Podiatry

## 2018-11-23 MED ORDER — OXYCODONE-ACETAMINOPHEN 5-325 MG PO TABS: 1.0000 | ORAL_TABLET | Freq: Three times a day (TID) | ORAL | 0 refills | Status: DC | PRN

## 2018-11-29 ENCOUNTER — Encounter: Payer: Self-pay | Admitting: Podiatry

## 2018-12-03 ENCOUNTER — Ambulatory Visit: Payer: Medicaid Other | Admitting: Podiatry

## 2018-12-10 ENCOUNTER — Encounter: Payer: Self-pay | Admitting: Podiatry

## 2018-12-12 ENCOUNTER — Other Ambulatory Visit: Payer: Self-pay | Admitting: Podiatry

## 2018-12-12 MED ORDER — HYDROCODONE-ACETAMINOPHEN 5-325 MG PO TABS: 1.0000 | ORAL_TABLET | Freq: Four times a day (QID) | ORAL | 0 refills | Status: AC | PRN

## 2019-01-14 ENCOUNTER — Ambulatory Visit: Payer: Medicaid Other | Attending: Family Medicine | Admitting: Family Medicine

## 2019-01-14 ENCOUNTER — Other Ambulatory Visit: Payer: Self-pay

## 2019-03-19 ENCOUNTER — Ambulatory Visit: Payer: Medicaid Other | Attending: Family Medicine | Admitting: Family Medicine

## 2019-03-19 ENCOUNTER — Encounter: Payer: Self-pay | Admitting: Family Medicine

## 2019-03-19 ENCOUNTER — Other Ambulatory Visit: Payer: Self-pay

## 2019-03-19 DIAGNOSIS — G629 Polyneuropathy, unspecified: Secondary | ICD-10-CM | POA: Diagnosis not present

## 2019-03-19 DIAGNOSIS — Z833 Family history of diabetes mellitus: Secondary | ICD-10-CM | POA: Insufficient documentation

## 2019-03-19 DIAGNOSIS — Z801 Family history of malignant neoplasm of trachea, bronchus and lung: Secondary | ICD-10-CM | POA: Insufficient documentation

## 2019-03-19 DIAGNOSIS — I10 Essential (primary) hypertension: Secondary | ICD-10-CM | POA: Diagnosis not present

## 2019-03-19 DIAGNOSIS — E559 Vitamin D deficiency, unspecified: Secondary | ICD-10-CM | POA: Diagnosis not present

## 2019-03-19 DIAGNOSIS — Z881 Allergy status to other antibiotic agents status: Secondary | ICD-10-CM | POA: Diagnosis not present

## 2019-03-19 DIAGNOSIS — R221 Localized swelling, mass and lump, neck: Secondary | ICD-10-CM

## 2019-03-19 DIAGNOSIS — Z8249 Family history of ischemic heart disease and other diseases of the circulatory system: Secondary | ICD-10-CM | POA: Insufficient documentation

## 2019-03-19 DIAGNOSIS — Z800 Family history of malignant neoplasm of digestive organs: Secondary | ICD-10-CM | POA: Insufficient documentation

## 2019-03-19 DIAGNOSIS — R5383 Other fatigue: Secondary | ICD-10-CM | POA: Diagnosis not present

## 2019-03-19 DIAGNOSIS — Z79899 Other long term (current) drug therapy: Secondary | ICD-10-CM | POA: Diagnosis not present

## 2019-03-19 DIAGNOSIS — Z720 Tobacco use: Secondary | ICD-10-CM | POA: Diagnosis not present

## 2019-03-19 NOTE — Progress Notes (Signed)
Subjective:  Patient ID: [name], female    DOB: 16-Mar-1971  Age: 48 y.o. 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  She was initially scheduled for a telehealth visit but had complained of neck swelling and fatigue and was advised to come into the clinic in person for examination. She has noticed neck swelling which she described as "lymphadenopathy" the last 3 months and this is not tender and she has also noticed fatigue.  Denies presence of lumps in other body parts, denies fever, denies myalgias.  Denies presence of sore throat, abdominal pain, decreased appetite. She denies chest pains or dyspnea and endorses a history of smoking. She does have a history of vitamin D deficiency with a level of 20 and 09/24/2018.  Past Medical History:  Diagnosis Date  . History of alcoholism ([place])   . Hypertension   . Malnutrition ([place])   . Peripheral neuropathy   . Strachan's syndrome     Past Surgical History:  Procedure Laterality Date  . MULTIPLE EXTRACTIONS WITH ALVEOLOPLASTY N/A 12/25/2013   Procedure: Extraction of tooth #'s 2,3,4,5,6,8,9,10,11,12,13,14,15,20,21,22,23,24,25,26,27, [phone] with alveoloplasty and bilateral mandibular tori reductions;  Surgeon: Lenn Cal, DDS;  Location: [facility];  Service: Oral Surgery;  Laterality: N/A;  . SURAL NERVE BX Right 12/24/2013   Procedure: SURAL NERVE BIOPSY;  Surgeon: Kristeen Miss, MD;  Location: [facility];  Service: Neurosurgery;  Laterality: Right;  . TUBAL LIGATION      Family History  Adopted: Yes  Problem Relation Age of Onset  . Diabetes Father   . Hypertension Mother   . Raynaud syndrome Mother   . Diabetes Maternal Aunt   . Heart disease Maternal Aunt   . Diabetes Maternal Uncle   . Heart disease Maternal Uncle   . Drug  abuse Maternal Grandmother   . Cancer Maternal Grandmother        intestinal  . Hypertension Maternal Grandmother   . Cancer Maternal Grandfather        lung  . Hypertension Paternal Grandfather     Allergies  Allergen Reactions  . Macrobid [Nitrofurantoin Macrocrystal] Anaphylaxis and Other (See Comments)    Patient reports 2 syncope episodes after her mouth became numb    Outpatient Medications Prior to Visit  Medication Sig Dispense Refill  . amLODipine (NORVASC) 5 MG tablet Take 1 tablet (5 mg total) by mouth daily. 30 tablet 6  . DULoxetine (CYMBALTA) 60 MG capsule TAKE 1 CAPSULE BY MOUTH TWICE A DAY 60 capsule 6  . hydrALAZINE (APRESOLINE) 10 MG tablet Take 1 tablet (10 mg total) by mouth 3 (three) times daily. 90 tablet 6  . Multiple Vitamins-Minerals (MULTIVITAMIN WITH MINERALS) tablet Take 1 tablet by mouth daily.    Marland Kitchen omeprazole (PRILOSEC) 20 MG capsule TAKE 1 CAPSULE BY MOUTH EVERY DAY 30 capsule 6  . potassium chloride (K-DUR,KLOR-CON) 10 MEQ tablet Take 1 tablet (10 mEq total) by mouth daily. 30 tablet 6  . pregabalin (LYRICA) 100 MG capsule TAKE 1 CAPSULE BY MOUTH THREE TIMES A DAY 90 capsule 5  . traZODone (DESYREL) 100 MG tablet Take 1 tablet (100 mg total) by mouth at bedtime as needed for sleep. 30 tablet 6  . meloxicam (MOBIC) 7.5 MG tablet Take 1 tablet (7.5 mg total) by mouth daily. (Patient not taking: Reported  on 11/19/2018) 30 tablet 1  . methocarbamol (ROBAXIN) 500 MG tablet Take 1 tablet (500 mg total) by mouth 2 (two) times daily as needed for muscle spasms. (Patient not taking: Reported on 11/19/2018) 60 tablet 1  . oxyCODONE-acetaminophen (PERCOCET/ROXICET) 5-325 MG tablet Take 1 tablet by mouth every 8 (eight) hours as needed for severe pain. (Patient not taking: Reported on 03/19/2019) 10 tablet 0  . Vitamin D, Ergocalciferol, (DRISDOL) 1.25 MG (50000 UT) CAPS capsule Take 1 capsule (50,000 Units total) by mouth every 7 (seven) days. (Patient not taking: Reported  on 11/19/2018) 5 capsule 0   No facility-administered medications prior to visit.      ROS Review of Systems  Constitutional: Negative for activity change, appetite change and fatigue.  HENT: Negative for congestion, sinus pressure and sore throat.   Eyes: Negative for visual disturbance.  Respiratory: Negative for cough, chest tightness, shortness of breath and wheezing.   Cardiovascular: Negative for chest pain and palpitations.  Gastrointestinal: Negative for abdominal distention, abdominal pain and constipation.  Endocrine: Negative for polydipsia.  Genitourinary: Negative for dysuria and frequency.  Musculoskeletal: Negative for arthralgias and back pain.  Skin: Negative for rash.  Neurological: Negative for tremors, light-headedness and numbness.  Hematological: Does not bruise/bleed easily.  Psychiatric/Behavioral: Negative for agitation and behavioral problems.    Objective:  There were no vitals taken for this visit.  BP/Weight 11/19/2018 10/01/2018 0/35/0093  Systolic BP 818 299 371  Diastolic BP 77 80 96  Wt. (Lbs) 142.8 145.38 -  BMI 23.76 24.19 -      Physical Exam Constitutional:      Appearance: She is well-developed.  Neck:     Musculoskeletal: No neck rigidity or muscular tenderness.     Vascular: No carotid bruit.  Cardiovascular:     Rate and Rhythm: Normal rate.     Heart sounds: Normal heart sounds. No murmur.  Pulmonary:     Effort: Pulmonary effort is normal.     Breath sounds: Normal breath sounds. No wheezing or rales.  Chest:     Chest wall: No tenderness.  Abdominal:     General: Bowel sounds are normal. There is no distension.     Palpations: Abdomen is soft. There is no mass.     Tenderness: There is no abdominal tenderness.  Musculoskeletal:        General: Swelling (b/l trapezius muscles at base of neck, no TTP) present.  Lymphadenopathy:     Cervical: No cervical adenopathy.  Neurological:     Mental Status: She is alert and  oriented to person, place, and time.     CMP Latest Ref Rng & Units 11/19/2018 01/01/2018 05/24/2017  Glucose 65 - 99 mg/dL 108(H) 117(H) 114(H)  BUN 6 - 24 mg/dL 3(L) 4(L) 4(L)  Creatinine 0.57 - 1.00 mg/dL 0.70 0.64 0.61  Sodium 134 - 144 mmol/L 142 139 138  Potassium 3.5 - 5.2 mmol/L 3.3(L) 3.7 4.4  Chloride 96 - 106 mmol/L 99 100 99  CO2 20 - 29 mmol/L '25 23 26  '$ Calcium 8.7 - 10.2 mg/dL 8.9 9.0 8.6(L)  Total Protein 6.0 - 8.5 g/dL 6.4 7.0 6.7  Total Bilirubin 0.0 - 1.2 mg/dL 0.3 0.3 <0.2  Alkaline Phos 39 - 117 IU/L 152(H) 136(H) 135(H)  AST 0 - 40 IU/L '22 17 19  '$ ALT 0 - 32 IU/L '8 11 7    '$ Lipid Panel     Component Value Date/Time   CHOL 192 11/19/2018 1004   TRIG  168 (H) 11/19/2018 1004   HDL 40 11/19/2018 1004   CHOLHDL 4.8 (H) 11/19/2018 1004   CHOLHDL 4.0 07/19/2016 1238   VLDL 26 07/19/2016 1238   LDLCALC 118 (H) 11/19/2018 1004    CBC    Component Value Date/Time   WBC 18.3 (H) 02/28/2017 2130   RBC 3.79 (L) 02/28/2017 2130   HGB 13.3 02/28/2017 2130   HGB 11.5 02/28/2017 1616   HCT 37.6 02/28/2017 2130   HCT 36.6 02/28/2017 1616   PLT 372 02/28/2017 2130   PLT 352 02/28/2017 1616   MCV 99.2 02/28/2017 2130   MCV 106 (H) 02/28/2017 1616   MCH 35.1 (H) 02/28/2017 2130   MCHC 35.4 02/28/2017 2130   RDW 15.3 02/28/2017 2130   RDW 15.9 (H) 02/28/2017 1616   LYMPHSABS 2.1 02/28/2017 1616   MONOABS 2.1 (H) 12/18/2013 1540   EOSABS 0.2 02/28/2017 1616   BASOSABS 0.0 02/28/2017 1616    Lab Results  Component Value Date   HGBA1C 4.8 12/20/2013    Assessment & Plan:   1. Neck mass Possibly fatty deposition she might have gained weight Given associated fatigue, I will refer for further evaluation No lymphadenopathy noted on exam - DG Chest 2 View; Future - US Soft Tissue Head/Neck; Future  2. Other fatigue - VITAMIN D 25 Hydroxy (Vit-D Deficiency, Fractures) - CMP14+EGFR - CBC with Differential/Platelet - T4, free - TSH  3. Tobacco abuse We will  need to exclude presence of Pancoast tumor given neck swelling - DG Chest 2 View; Future  4. Vitamin D deficiency Previous vitamin D deficiency Repeat vitamin D levels due to ongoing fatigue   No orders of the defined types were placed in this encounter.   Follow-up: Return for medical conditions, keep previously scheduled appointment.       Charlott Rakes, MD, FAAFP. [facility] and [place], [employer]   03/19/2019, 3:26 PM

## 2019-03-19 NOTE — Patient Instructions (Signed)

## 2019-03-19 NOTE — Progress Notes (Signed)
Patient has been called and DOB has been verified. Patient has been screened and transferred to PCP to start phone visit.     

## 2019-03-20 ENCOUNTER — Other Ambulatory Visit: Payer: Self-pay | Admitting: Family Medicine

## 2019-03-20 LAB — TSH: TSH: 0.703 u[IU]/mL (ref 0.450–4.500)

## 2019-03-20 LAB — CBC WITH DIFFERENTIAL/PLATELET
Basophils Absolute: 0 10*3/uL (ref 0.0–0.2)
Basos: 0 %
EOS (ABSOLUTE): 0.2 10*3/uL (ref 0.0–0.4)
Eos: 1 %
Hematocrit: 44 % (ref 34.0–46.6)
Hemoglobin: 14.6 g/dL (ref 11.1–15.9)
Immature Grans (Abs): 0 10*3/uL (ref 0.0–0.1)
Immature Granulocytes: 0 %
Lymphocytes Absolute: 2.5 10*3/uL (ref 0.7–3.1)
Lymphs: 20 %
MCH: 34.8 pg — ABNORMAL HIGH (ref 26.6–33.0)
MCHC: 33.2 g/dL (ref 31.5–35.7)
MCV: 105 fL — ABNORMAL HIGH (ref 79–97)
Monocytes Absolute: 0.9 10*3/uL (ref 0.1–0.9)
Monocytes: 7 %
Neutrophils Absolute: 9.3 10*3/uL — ABNORMAL HIGH (ref 1.4–7.0)
Neutrophils: 72 %
Platelets: 240 10*3/uL (ref 150–450)
RBC: 4.19 x10E6/uL (ref 3.77–5.28)
RDW: 15.4 % (ref 11.7–15.4)
WBC: 12.9 10*3/uL — ABNORMAL HIGH (ref 3.4–10.8)

## 2019-03-20 LAB — CMP14+EGFR
ALT: 13 IU/L (ref 0–32)
AST: 30 IU/L (ref 0–40)
Albumin/Globulin Ratio: 1.3 (ref 1.2–2.2)
Albumin: 3.7 g/dL — ABNORMAL LOW (ref 3.8–4.8)
Alkaline Phosphatase: 168 IU/L — ABNORMAL HIGH (ref 39–117)
BUN/Creatinine Ratio: 5 — ABNORMAL LOW (ref 9–23)
BUN: 4 mg/dL — ABNORMAL LOW (ref 6–24)
Bilirubin Total: 0.3 mg/dL (ref 0.0–1.2)
CO2: 29 mmol/L (ref 20–29)
Calcium: 9.2 mg/dL (ref 8.7–10.2)
Chloride: 100 mmol/L (ref 96–106)
Creatinine, Ser: 0.73 mg/dL (ref 0.57–1.00)
GFR calc Af Amer: 113 mL/min/{1.73_m2} (ref >59)
GFR calc non Af Amer: 98 mL/min/{1.73_m2} (ref >59)
Globulin, Total: 2.8 g/dL (ref 1.5–4.5)
Glucose: 103 mg/dL — ABNORMAL HIGH (ref 65–99)
Potassium: 4.2 mmol/L (ref 3.5–5.2)
Sodium: 144 mmol/L (ref 134–144)
Total Protein: 6.5 g/dL (ref 6.0–8.5)

## 2019-03-20 LAB — T4, FREE: Free T4: 0.98 ng/dL (ref 0.82–1.77)

## 2019-03-20 LAB — VITAMIN D 25 HYDROXY (VIT D DEFICIENCY, FRACTURES): Vit D, 25-Hydroxy: 26.3 ng/mL — ABNORMAL LOW (ref 30.0–100.0)

## 2019-03-20 MED ORDER — VITAMIN D (ERGOCALCIFEROL) 1.25 MG (50000 UNIT) PO CAPS: 50000.0000 [IU] | ORAL_CAPSULE | ORAL | 1 refills | Status: DC

## 2019-03-29 ENCOUNTER — Ambulatory Visit: Payer: Medicaid Other | Admitting: Podiatry

## 2019-04-02 ENCOUNTER — Other Ambulatory Visit: Payer: Self-pay

## 2019-04-02 ENCOUNTER — Ambulatory Visit (HOSPITAL_COMMUNITY)
Admission: RE | Admit: 2019-04-02 | Discharge: 2019-04-02 | Disposition: A | Discharge status: Home / Self Care | Payer: Medicaid Other | Source: Ambulatory Visit | Attending: Family Medicine | Admitting: Family Medicine

## 2019-04-02 DIAGNOSIS — Z720 Tobacco use: Secondary | ICD-10-CM

## 2019-04-02 DIAGNOSIS — R221 Localized swelling, mass and lump, neck: Secondary | ICD-10-CM

## 2019-04-04 ENCOUNTER — Ambulatory Visit: Payer: Medicaid Other | Admitting: Podiatry

## 2019-04-04 ENCOUNTER — Ambulatory Visit (INDEPENDENT_AMBULATORY_CARE_PROVIDER_SITE_OTHER): Payer: Medicaid Other

## 2019-04-04 ENCOUNTER — Other Ambulatory Visit: Payer: Self-pay

## 2019-04-04 DIAGNOSIS — R2241 Localized swelling, mass and lump, right lower limb: Secondary | ICD-10-CM | POA: Diagnosis not present

## 2019-04-04 DIAGNOSIS — M79671 Pain in right foot: Secondary | ICD-10-CM

## 2019-04-04 DIAGNOSIS — S92324K Nondisplaced fracture of second metatarsal bone, right foot, subsequent encounter for fracture with nonunion: Secondary | ICD-10-CM | POA: Diagnosis not present

## 2019-04-04 DIAGNOSIS — S92901K Unspecified fracture of right foot, subsequent encounter for fracture with nonunion: Secondary | ICD-10-CM

## 2019-04-05 NOTE — Progress Notes (Signed)
Subjective: 48 year old female presents the office today for concerns of right foot pain and swelling mostly to the top of the foot.  She states that she still gets discomfort redness of the top of her foot and she is noticed erythema on top of the foot as well which is been present is not resolved.  She states that she does try to walk a couple miles a day she gets discomfort.  She is been a daily basis.  She is tried changing shoes any significant treatment. Denies any systemic complaints such as fevers, chills, nausea, vomiting. No acute changes since last appointment, and no other complaints at this time.   Objective: AAO x3, NAD DP/PT pulses palpable bilaterally, CRT less than 3 seconds There is tenderness to palpation over the right foot along the 2nd metatarsal base. There is bony "knot" present.  There is mild chronic edema.  There is no erythema or warmth.  No pain with ankle or forefoot otherwise. No open lesions or pre-ulcerative lesions.  No pain with calf compression, swelling, warmth, erythema  Assessment: Right foot second metatarsal nonunion  Plan: -All treatment options discussed with the patient including all alternatives, risks, complications.  -X-rays were obtained and reviewed.  Radiolucent line still present at the base of the second metatarsal consistent with a nonunion.  Given her ongoing symptoms I discussed with her surgical intervention.  Prior to this I like to get a CT scan to evaluate the union of the other metatarsals and this is for surgical planning. -The meantime continue with a more rigid bottom shoe.  Continue ice to the area.. -Patient encouraged to call the office with any questions, concerns, change in symptoms.    Trula Slade [name]

## 2019-04-08 ENCOUNTER — Telehealth: Payer: Self-pay | Admitting: *Deleted

## 2019-04-08 NOTE — Telephone Encounter (Signed)
[employer] - Medicaid requires clinicals to pre-cert CT of right foot for Case:  [id number]. Faxed required clinicals to [facility].

## 2019-04-12 NOTE — Telephone Encounter (Signed)
[employer] - MEDICAID AUTHORIZATION:  [id number], EFFECTIVE: 04/08/2019, END:  10/05/2019 FOR CT 67014 RIGHT. Faxed to [facility].

## 2019-04-15 ENCOUNTER — Ambulatory Visit
Admission: RE | Admit: 2019-04-15 | Discharge: 2019-04-15 | Disposition: A | Discharge status: Home / Self Care | Payer: Medicaid Other | Source: Ambulatory Visit | Attending: Podiatry | Admitting: Podiatry

## 2019-04-15 DIAGNOSIS — S92324K Nondisplaced fracture of second metatarsal bone, right foot, subsequent encounter for fracture with nonunion: Secondary | ICD-10-CM | POA: Diagnosis not present

## 2019-04-26 ENCOUNTER — Telehealth: Payer: Self-pay | Admitting: Podiatry

## 2019-04-26 MED ORDER — TRAMADOL HCL 50 MG PO TABS: 50.0000 mg | ORAL_TABLET | Freq: Three times a day (TID) | ORAL | 0 refills | Status: AC | PRN

## 2019-04-26 NOTE — Telephone Encounter (Signed)
Rx for Tramadol sent.

## 2019-04-29 ENCOUNTER — Ambulatory Visit: Payer: Medicaid Other | Admitting: Podiatry

## 2019-04-29 ENCOUNTER — Other Ambulatory Visit: Payer: Self-pay | Admitting: Neurology

## 2019-04-30 ENCOUNTER — Other Ambulatory Visit: Payer: Self-pay | Admitting: Neurology

## 2019-04-30 ENCOUNTER — Telehealth: Payer: Self-pay | Admitting: Neurology

## 2019-04-30 NOTE — Telephone Encounter (Signed)
Refill on the Lyrica 100mg  30 day supply- needs to go to [facility] [place]. Thanks!

## 2019-05-03 ENCOUNTER — Telehealth: Payer: Self-pay | Admitting: Podiatry

## 2019-05-03 ENCOUNTER — Ambulatory Visit: Payer: Medicaid Other | Admitting: Podiatry

## 2019-05-03 ENCOUNTER — Other Ambulatory Visit: Payer: Self-pay

## 2019-05-03 DIAGNOSIS — M79671 Pain in right foot: Secondary | ICD-10-CM

## 2019-05-03 DIAGNOSIS — S92324K Nondisplaced fracture of second metatarsal bone, right foot, subsequent encounter for fracture with nonunion: Secondary | ICD-10-CM | POA: Diagnosis not present

## 2019-05-03 MED ORDER — HYDROCODONE-ACETAMINOPHEN 5-325 MG PO TABS: 1.0000 | ORAL_TABLET | Freq: Four times a day (QID) | ORAL | 0 refills | Status: DC | PRN

## 2019-05-03 NOTE — Telephone Encounter (Signed)
done

## 2019-05-03 NOTE — Telephone Encounter (Signed)
Pt was seen in office this morning and was supposed to have a prescription sent in to the pharmacy but they have not received.   [facility] [place]

## 2019-05-03 NOTE — Progress Notes (Signed)
Subjective: 48 year old female presents the office with her mom for follow-up evaluation of right foot pain, nonunion second metatarsal is also discussed CT scan results.  She still having discomfort to her foot and she wants to consider surgical intervention in the CT scan.  She is currently not working. Denies any systemic complaints such as fevers, chills, nausea, vomiting. No acute changes since last appointment, and no other complaints at this time.   Objective: AAO x3, NAD DP/PT pulses palpable bilaterally, CRT less than 3 seconds Still tenderness palpation of the second tarsal base.  Mild edema to this area dorsally there is no erythema or warmth.  Flexor, extensor tendons are intact. No open lesions or pre-ulcerative lesions.  No pain with calf compression, swelling, warmth, erythema  Assessment: Nonunion right foot second metatarsal  Plan: -All treatment options discussed with the patient including all alternatives, risks, complications.  -CT scan results were discussed with her I also reviewed the prior x-rays.  We discussed both conservative as well as surgical treatment options.  At this time she wished to proceed with surgical intervention given her acute pain. -Discussed right second metatarsal repair of nonunion with bone graft from calcaneus. -The incision placement as well as the postoperative course was discussed with the patient. I discussed risks of the surgery which include, but not limited to, infection, bleeding, pain, swelling, need for further surgery, delayed or nonhealing, painful or ugly scar, numbness or sensation changes, over/under correction, recurrence, transfer lesions, further deformity, hardware failure, DVT/PE, loss of toe/foot. Patient understands these risks and wishes to proceed with surgery. The surgical consent was reviewed with the patient all 3 pages were signed. No promises or guarantees were given to the outcome of the procedure. All questions were  answered to the best of my ability. Before the surgery the patient was encouraged to call the office if there is any further questions. The surgery will be performed at the [facility] on an outpatient basis. -Patient encouraged to call the office with any questions, concerns, change in symptoms.   Trula Slade [name]

## 2019-05-03 NOTE — Patient Instructions (Signed)

## 2019-05-08 ENCOUNTER — Encounter: Payer: Self-pay | Admitting: Podiatry

## 2019-05-08 ENCOUNTER — Other Ambulatory Visit: Payer: Self-pay | Admitting: Podiatry

## 2019-05-08 MED ORDER — HYDROCODONE-ACETAMINOPHEN 5-325 MG PO TABS: 1.0000 | ORAL_TABLET | Freq: Four times a day (QID) | ORAL | 0 refills | Status: DC | PRN

## 2019-05-08 NOTE — Progress Notes (Signed)
error 

## 2019-05-09 ENCOUNTER — Telehealth: Payer: Self-pay | Admitting: *Deleted

## 2019-05-09 NOTE — Telephone Encounter (Signed)
I left a message for the patient to call me back.  Per Dr. Jacqualyn Posey, we need to change the location due to [facility] not being able to supply the requested instrumentation and needed specials.  If the patient agrees to doing it at [facility], she will need to have a history and physical form completed as well as get a Covid test 3-4 days prior to decided surgery date.

## 2019-05-13 NOTE — Telephone Encounter (Signed)
"  I was calling you back.  I'm supposed to be having surgery tomorrow.  I think.  You said it was rescheduled.  So if you can, give me a call back."  I am calling you back.  I was calling in regards to your surgery that was scheduled for 05/22/2019.  Due to the expense of the hardware, your surgery can't be done at [facility].  It must be done at a [facility] facility.  With it being done at a [facility], [facility] requires a history and physical be done within a 30 day window before the surgery date.  So have you had a physical recently?  "Probably not, I think it was a couple of months ago."  Can you call and see if you can get an appointment with your primary care doctor?  Then give me a call and let me know the date of the appointment.  Once we have that information, we can get you scheduled for your surgery.  "Okay, I'll call in the morning and see when I can get an appointment.  I'll call and let you know."

## 2019-05-13 NOTE — Telephone Encounter (Signed)
I attempted to call the patient again.  I left her a message to call me back.  I informed her that we need to change the date of her appointment and that we need to change the location.

## 2019-05-14 ENCOUNTER — Other Ambulatory Visit: Payer: Self-pay | Admitting: Podiatry

## 2019-05-14 MED ORDER — HYDROCODONE-ACETAMINOPHEN 5-325 MG PO TABS: 1.0000 | ORAL_TABLET | Freq: Four times a day (QID) | ORAL | 0 refills | Status: DC | PRN

## 2019-05-21 ENCOUNTER — Other Ambulatory Visit: Payer: Self-pay | Admitting: Podiatry

## 2019-05-21 MED ORDER — HYDROCODONE-ACETAMINOPHEN 5-325 MG PO TABS: 1.0000 | ORAL_TABLET | Freq: Four times a day (QID) | ORAL | 0 refills | Status: DC | PRN

## 2019-05-27 ENCOUNTER — Telehealth: Payer: Self-pay | Admitting: *Deleted

## 2019-05-27 ENCOUNTER — Other Ambulatory Visit: Payer: Self-pay | Admitting: Podiatry

## 2019-05-27 MED ORDER — HYDROCODONE-ACETAMINOPHEN 5-325 MG PO TABS: 1.0000 | ORAL_TABLET | Freq: Four times a day (QID) | ORAL | 0 refills | Status: DC | PRN

## 2019-05-27 NOTE — Telephone Encounter (Signed)
Pt states she is having pain in the 5 broken metatarsals, has an appt for PE on 06/12/2019 prior to her surgery at the hospital. Pt request the pain medication be sent to the [facility].

## 2019-05-28 NOTE — Telephone Encounter (Signed)
Done. I had originally called it into the [facility] on [address] but called and cancelled that

## 2019-05-29 ENCOUNTER — Encounter: Payer: Self-pay | Admitting: Podiatry

## 2019-05-29 ENCOUNTER — Telehealth: Payer: Self-pay | Admitting: Podiatry

## 2019-05-29 ENCOUNTER — Other Ambulatory Visit: Payer: Self-pay | Admitting: Podiatry

## 2019-05-29 NOTE — Telephone Encounter (Signed)
Requesting a refill of hydrocodone.

## 2019-05-29 NOTE — Telephone Encounter (Signed)
It was sent Monday.

## 2019-06-04 ENCOUNTER — Other Ambulatory Visit: Payer: Self-pay | Admitting: Podiatry

## 2019-06-05 ENCOUNTER — Telehealth: Payer: Self-pay | Admitting: *Deleted

## 2019-06-05 NOTE — Telephone Encounter (Signed)
Pt called states she is calling to check on the pain medication refill.

## 2019-06-06 MED ORDER — HYDROCODONE-ACETAMINOPHEN 5-325 MG PO TABS: 1.0000 | ORAL_TABLET | Freq: Four times a day (QID) | ORAL | 0 refills | Status: DC | PRN

## 2019-06-06 NOTE — Telephone Encounter (Signed)
done

## 2019-06-12 ENCOUNTER — Ambulatory Visit: Payer: Medicaid Other | Attending: Family Medicine | Admitting: Family Medicine

## 2019-06-12 ENCOUNTER — Other Ambulatory Visit: Payer: Self-pay

## 2019-06-12 ENCOUNTER — Encounter: Payer: Self-pay | Admitting: Family Medicine

## 2019-06-12 VITALS — BP 130/77 | HR 67 | Temp 98.2°F | Ht 65.0 in | Wt 137.6 lb

## 2019-06-12 DIAGNOSIS — Z23 Encounter for immunization: Secondary | ICD-10-CM | POA: Diagnosis not present

## 2019-06-12 DIAGNOSIS — K219 Gastro-esophageal reflux disease without esophagitis: Secondary | ICD-10-CM

## 2019-06-12 DIAGNOSIS — G4709 Other insomnia: Secondary | ICD-10-CM | POA: Diagnosis not present

## 2019-06-12 DIAGNOSIS — F419 Anxiety disorder, unspecified: Secondary | ICD-10-CM | POA: Diagnosis not present

## 2019-06-12 DIAGNOSIS — G629 Polyneuropathy, unspecified: Secondary | ICD-10-CM | POA: Diagnosis not present

## 2019-06-12 DIAGNOSIS — Z0001 Encounter for general adult medical examination with abnormal findings: Secondary | ICD-10-CM

## 2019-06-12 DIAGNOSIS — F329 Major depressive disorder, single episode, unspecified: Secondary | ICD-10-CM | POA: Diagnosis not present

## 2019-06-12 DIAGNOSIS — Z0000 Encounter for general adult medical examination without abnormal findings: Secondary | ICD-10-CM | POA: Diagnosis not present

## 2019-06-12 DIAGNOSIS — Z881 Allergy status to other antibiotic agents status: Secondary | ICD-10-CM | POA: Insufficient documentation

## 2019-06-12 DIAGNOSIS — Z79899 Other long term (current) drug therapy: Secondary | ICD-10-CM | POA: Insufficient documentation

## 2019-06-12 DIAGNOSIS — Z8249 Family history of ischemic heart disease and other diseases of the circulatory system: Secondary | ICD-10-CM | POA: Diagnosis not present

## 2019-06-12 DIAGNOSIS — I10 Essential (primary) hypertension: Secondary | ICD-10-CM

## 2019-06-12 DIAGNOSIS — Z131 Encounter for screening for diabetes mellitus: Secondary | ICD-10-CM | POA: Diagnosis not present

## 2019-06-12 DIAGNOSIS — G6289 Other specified polyneuropathies: Secondary | ICD-10-CM | POA: Diagnosis not present

## 2019-06-12 DIAGNOSIS — F32A Depression, unspecified: Secondary | ICD-10-CM

## 2019-06-12 DIAGNOSIS — Z1239 Encounter for other screening for malignant neoplasm of breast: Secondary | ICD-10-CM

## 2019-06-12 LAB — POCT GLYCOSYLATED HEMOGLOBIN (HGB A1C): HbA1c, POC (prediabetic range): 5.1 % — AB (ref 5.7–6.4)

## 2019-06-12 MED ORDER — HYDRALAZINE HCL 10 MG PO TABS: 10.0000 mg | ORAL_TABLET | Freq: Three times a day (TID) | ORAL | 6 refills | Status: DC

## 2019-06-12 MED ORDER — AMLODIPINE BESYLATE 5 MG PO TABS: 5.0000 mg | ORAL_TABLET | Freq: Every day | ORAL | 6 refills | Status: DC

## 2019-06-12 MED ORDER — HYDROXYZINE HCL 25 MG PO TABS: 25.0000 mg | ORAL_TABLET | Freq: Three times a day (TID) | ORAL | 3 refills | Status: DC | PRN

## 2019-06-12 MED ORDER — DULOXETINE HCL 60 MG PO CPEP: ORAL_CAPSULE | ORAL | 6 refills | Status: DC

## 2019-06-12 MED ORDER — TRAZODONE HCL 100 MG PO TABS: 100.0000 mg | ORAL_TABLET | Freq: Every evening | ORAL | 6 refills | Status: DC | PRN

## 2019-06-12 MED ORDER — OMEPRAZOLE 20 MG PO CPDR: DELAYED_RELEASE_CAPSULE | ORAL | 6 refills | Status: DC

## 2019-06-12 NOTE — Patient Instructions (Signed)
Health Maintenance, Female Adopting a healthy lifestyle and getting preventive care are important in promoting health and wellness. Ask your health care provider about:  The right schedule for you to have regular tests and exams.  Things you can do on your own to prevent diseases and keep yourself healthy. What should I know about diet, weight, and exercise? Eat a healthy diet   Eat a diet that includes plenty of vegetables, fruits, low-fat dairy products, and lean protein.  Do not eat a lot of foods that are high in solid fats, added sugars, or sodium. Maintain a healthy weight Body mass index (BMI) is used to identify weight problems. It estimates body fat based on height and weight. Your health care provider can help determine your BMI and help you achieve or maintain a healthy weight. Get regular exercise Get regular exercise. This is one of the most important things you can do for your health. Most adults should:  Exercise for at least 150 minutes each week. The exercise should increase your heart rate and make you sweat (moderate-intensity exercise).  Do strengthening exercises at least twice a week. This is in addition to the moderate-intensity exercise.  Spend less time sitting. Even light physical activity can be beneficial. Watch cholesterol and blood lipids Have your blood tested for lipids and cholesterol at 48 years of age, then have this test every 5 years. Have your cholesterol levels checked more often if:  Your lipid or cholesterol levels are high.  You are older than 48 years of age.  You are at high risk for heart disease. What should I know about cancer screening? Depending on your health history and family history, you may need to have cancer screening at various ages. This may include screening for:  Breast cancer.  Cervical cancer.  Colorectal cancer.  Skin cancer.  Lung cancer. What should I know about heart disease, diabetes, and high blood  pressure? Blood pressure and heart disease  High blood pressure causes heart disease and increases the risk of stroke. This is more likely to develop in people who have high blood pressure readings, are of African descent, or are overweight.  Have your blood pressure checked: ? Every 3-5 years if you are 18-39 years of age. ? Every year if you are 40 years old or older. Diabetes Have regular diabetes screenings. This checks your fasting blood sugar level. Have the screening done:  Once every three years after age 40 if you are at a normal weight and have a low risk for diabetes.  More often and at a younger age if you are overweight or have a high risk for diabetes. What should I know about preventing infection? Hepatitis B If you have a higher risk for hepatitis B, you should be screened for this virus. Talk with your health care provider to find out if you are at risk for hepatitis B infection. Hepatitis C Testing is recommended for:  Everyone born from 1945 through 1965.  Anyone with known risk factors for hepatitis C. Sexually transmitted infections (STIs)  Get screened for STIs, including gonorrhea and chlamydia, if: ? You are sexually active and are younger than 48 years of age. ? You are older than 48 years of age and your health care provider tells you that you are at risk for this type of infection. ? Your sexual activity has changed since you were last screened, and you are at increased risk for chlamydia or gonorrhea. Ask your health care provider if   you are at risk.  Ask your health care provider about whether you are at high risk for HIV. Your health care provider may recommend a prescription medicine to help prevent HIV infection. If you choose to take medicine to prevent HIV, you should first get tested for HIV. You should then be tested every 3 months for as long as you are taking the medicine. Pregnancy  If you are about to stop having your period (premenopausal) and  you may become pregnant, seek counseling before you get pregnant.  Take 400 to 800 micrograms (mcg) of folic acid every day if you become pregnant.  Ask for birth control (contraception) if you want to prevent pregnancy. Osteoporosis and menopause Osteoporosis is a disease in which the bones lose minerals and strength with aging. This can result in bone fractures. If you are 65 years old or older, or if you are at risk for osteoporosis and fractures, ask your health care provider if you should:  Be screened for bone loss.  Take a calcium or vitamin D supplement to lower your risk of fractures.  Be given hormone replacement therapy (HRT) to treat symptoms of menopause. Follow these instructions at home: Lifestyle  Do not use any products that contain nicotine or tobacco, such as cigarettes, e-cigarettes, and chewing tobacco. If you need help quitting, ask your health care provider.  Do not use street drugs.  Do not share needles.  Ask your health care provider for help if you need support or information about quitting drugs. Alcohol use  Do not drink alcohol if: ? Your health care provider tells you not to drink. ? You are pregnant, may be pregnant, or are planning to become pregnant.  If you drink alcohol: ? Limit how much you use to 0-1 drink a day. ? Limit intake if you are breastfeeding.  Be aware of how much alcohol is in your drink. In the [place] one drink equals one 12 oz bottle of beer (355 mL), one 5 oz glass of wine (148 mL), or one 1 oz glass of hard liquor (44 mL). General instructions  Schedule regular health, dental, and eye exams.  Stay current with your vaccines.  Tell your health care provider if: ? You often feel depressed. ? You have ever been abused or do not feel safe at home. Summary  Adopting a healthy lifestyle and getting preventive care are important in promoting health and wellness.  Follow your health care provider's instructions about healthy  diet, exercising, and getting tested or screened for diseases.  Follow your health care provider's instructions on monitoring your cholesterol and blood pressure. This information is not intended to replace advice given to you by your health care provider. Make sure you discuss any questions you have with your health care provider. Document Released: 03/14/2011 Document Revised: 08/22/2018 Document Reviewed: 08/22/2018 Elsevier Patient Education  2020 Elsevier Inc.  
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
## 2019-06-13 LAB — CBC WITH DIFFERENTIAL/PLATELET
Basophils Absolute: 0.1 10*3/uL (ref 0.0–0.2)
Basos: 1 %
EOS (ABSOLUTE): 0.2 10*3/uL (ref 0.0–0.4)
Eos: 2 %
Hematocrit: 42.6 % (ref 34.0–46.6)
Hemoglobin: 14.5 g/dL (ref 11.1–15.9)
Immature Grans (Abs): 0 10*3/uL (ref 0.0–0.1)
Immature Granulocytes: 0 %
Lymphocytes Absolute: 2.8 10*3/uL (ref 0.7–3.1)
Lymphs: 20 %
MCH: 34.8 pg — ABNORMAL HIGH (ref 26.6–33.0)
MCHC: 34 g/dL (ref 31.5–35.7)
MCV: 102 fL — ABNORMAL HIGH (ref 79–97)
Monocytes Absolute: 1.2 10*3/uL — ABNORMAL HIGH (ref 0.1–0.9)
Monocytes: 9 %
Neutrophils Absolute: 9.4 10*3/uL — ABNORMAL HIGH (ref 1.4–7.0)
Neutrophils: 68 %
Platelets: 286 10*3/uL (ref 150–450)
RBC: 4.17 x10E6/uL (ref 3.77–5.28)
RDW: 13.5 % (ref 11.7–15.4)
WBC: 13.7 10*3/uL — ABNORMAL HIGH (ref 3.4–10.8)

## 2019-06-13 LAB — CMP14+EGFR
ALT: 11 IU/L (ref 0–32)
AST: 32 IU/L (ref 0–40)
Albumin/Globulin Ratio: 1.5 (ref 1.2–2.2)
Albumin: 4.1 g/dL (ref 3.8–4.8)
Alkaline Phosphatase: 171 IU/L — ABNORMAL HIGH (ref 39–117)
BUN/Creatinine Ratio: 6 — ABNORMAL LOW (ref 9–23)
BUN: 4 mg/dL — ABNORMAL LOW (ref 6–24)
Bilirubin Total: 0.4 mg/dL (ref 0.0–1.2)
CO2: 29 mmol/L (ref 20–29)
Calcium: 9.1 mg/dL (ref 8.7–10.2)
Chloride: 96 mmol/L (ref 96–106)
Creatinine, Ser: 0.67 mg/dL (ref 0.57–1.00)
GFR calc Af Amer: 121 mL/min/{1.73_m2} (ref >59)
GFR calc non Af Amer: 105 mL/min/{1.73_m2} (ref >59)
Globulin, Total: 2.7 g/dL (ref 1.5–4.5)
Glucose: 81 mg/dL (ref 65–99)
Potassium: 4.3 mmol/L (ref 3.5–5.2)
Sodium: 139 mmol/L (ref 134–144)
Total Protein: 6.8 g/dL (ref 6.0–8.5)

## 2019-06-14 ENCOUNTER — Other Ambulatory Visit: Payer: Self-pay | Admitting: Podiatry

## 2019-06-14 NOTE — Telephone Encounter (Signed)
Please Advise? I will call and let the patient know if we can fill this RX. [name]

## 2019-06-15 MED ORDER — HYDROCODONE-ACETAMINOPHEN 5-325 MG PO TABS: 1.0000 | ORAL_TABLET | Freq: Four times a day (QID) | ORAL | 0 refills | Status: DC | PRN

## 2019-06-16 LAB — LIPID PANEL
Chol/HDL Ratio: 4.5 ratio — ABNORMAL HIGH (ref 0.0–4.4)
Cholesterol, Total: 202 mg/dL — ABNORMAL HIGH (ref 100–199)
HDL: 45 mg/dL (ref >39)
LDL Chol Calc (NIH): 121 mg/dL — ABNORMAL HIGH (ref 0–99)
Triglycerides: 203 mg/dL — ABNORMAL HIGH (ref 0–149)
VLDL Cholesterol Cal: 36 mg/dL (ref 5–40)

## 2019-06-16 LAB — SPECIMEN STATUS REPORT

## 2019-06-17 ENCOUNTER — Other Ambulatory Visit: Payer: Self-pay | Admitting: Family Medicine

## 2019-06-17 MED ORDER — ATORVASTATIN CALCIUM 20 MG PO TABS: 20.0000 mg | ORAL_TABLET | Freq: Every day | ORAL | 3 refills | Status: DC

## 2019-06-19 ENCOUNTER — Ambulatory Visit: Payer: Medicaid Other | Attending: Family Medicine | Admitting: Licensed Clinical Social Worker

## 2019-06-19 ENCOUNTER — Other Ambulatory Visit: Payer: Self-pay

## 2019-06-19 ENCOUNTER — Telehealth: Payer: Self-pay | Admitting: Licensed Clinical Social Worker

## 2019-06-19 NOTE — Telephone Encounter (Signed)
Call placed to patient for [name] appointment scheduled today. LCSW left message requesting a return call.

## 2019-06-20 ENCOUNTER — Telehealth: Payer: Self-pay | Admitting: *Deleted

## 2019-06-20 DIAGNOSIS — Z01812 Encounter for preprocedural laboratory examination: Secondary | ICD-10-CM

## 2019-06-20 NOTE — Telephone Encounter (Signed)
"  I'm calling you back.  If you can give me a call back whenever you're available.  Have a good blessed day."  I am calling to see if July 03, 2019 will be okay to schedule your surgery.  "Yes, that will be fine.  What time will it be?" It will be a 1 pm at [facility].  Have you had a physical recently by your primary care doctor?  "I saw him on September 30."  Did you get a history and physical form when you were here?  "No, I did not."  You can come by to get the form or I can fax it to your primary care physician.  "I'll call you tomorrow and give you the fax number."  You will get a call from the pre-op nurse.  She will give you your arrival time, usually they have you there two hours prior to your surgery time.  You will also need a Covid test.  The test is usually done three to four days prior to your surgery date.  Someone from pre-testing will give you a call to schedule that appointment.  "Okay, I'll call you tomorrow."

## 2019-06-20 NOTE — Telephone Encounter (Signed)
I attempted to call the patient to schedule her surgery.  I offered her July 03, 2019. I asked her to call me back.  (The surgery will be done at [facility] at 1 pm.)

## 2019-06-25 ENCOUNTER — Telehealth: Payer: Self-pay | Admitting: *Deleted

## 2019-06-25 NOTE — Telephone Encounter (Signed)
Left message informing pt I would send her pain status to another doctor, Dr. Jacqualyn Posey is out of the office until next week.

## 2019-06-25 NOTE — Telephone Encounter (Signed)
I attempted to call the patient to see who I needed to send the history and physical form to for completion.  I left her a message to call me tomorrow.

## 2019-06-25 NOTE — Telephone Encounter (Signed)
Pt request refill of the pain medication.

## 2019-06-25 NOTE — Telephone Encounter (Signed)
Great. Let me know what to do. Let me know which pain meds to send and I can to her pharmacy.

## 2019-06-26 ENCOUNTER — Encounter: Payer: Self-pay | Admitting: Family Medicine

## 2019-06-26 ENCOUNTER — Other Ambulatory Visit: Payer: Self-pay | Admitting: Podiatry

## 2019-06-26 MED ORDER — HYDROCODONE-ACETAMINOPHEN 5-325 MG PO TABS: 1.0000 | ORAL_TABLET | Freq: Four times a day (QID) | ORAL | 0 refills | Status: DC | PRN

## 2019-06-26 NOTE — Telephone Encounter (Signed)
I attempted to call the patient in regards to her history and physical form.  I informed her that we must cancel her appointment for 07/03/2019 if she has not had the history and form completed.

## 2019-06-26 NOTE — Telephone Encounter (Signed)
done

## 2019-06-27 NOTE — Telephone Encounter (Signed)
If you cannot get in touch with her via phone she has been  responsive on my chart.

## 2019-06-27 NOTE — Telephone Encounter (Signed)
I am calling you in regards to your history and physical form that is needed for your surgery that's scheduled for next week.  You were going to call me back and let me know who to send the form to for completion.  "I spoke to you yesterday."  I didn't speak with you.  "Well maybe it was Monday.  My doctor said she would do a letter.  You should be able to see it in your system."  I said her note now for 06/12/2019.  I will fax it the forms so she can complete them.  I was told the form had to be completed and sent per the pre-admit nurse at [facility].  "Okay, thank you so much."  I sent you a message in MyChart, please ignore it.  I faxed the history and physical form to Dr. Margarita Rana.

## 2019-06-28 ENCOUNTER — Telehealth: Payer: Self-pay | Admitting: *Deleted

## 2019-06-28 DIAGNOSIS — Z01812 Encounter for preprocedural laboratory examination: Secondary | ICD-10-CM

## 2019-06-28 NOTE — Telephone Encounter (Signed)
"  Did you receive my forms from my doctor.  I haven't received it but I faxed it to your doctor.  "I thought I was supposed to have a Covid test done.  I haven't heard anything."  I'll put in another order.  You can call the scheduler to schedule your appointment because it needs to be done this weekend.  You can call them at [phone].  "Okay, I'll give them a call.  Thanks for your help."

## 2019-06-29 ENCOUNTER — Other Ambulatory Visit (HOSPITAL_COMMUNITY)
Admission: RE | Admit: 2019-06-29 | Discharge: 2019-06-29 | Disposition: A | Discharge status: Home / Self Care | Payer: Medicaid Other | Source: Ambulatory Visit | Attending: Podiatry | Admitting: Podiatry

## 2019-06-29 DIAGNOSIS — Z01812 Encounter for preprocedural laboratory examination: Secondary | ICD-10-CM | POA: Diagnosis not present

## 2019-06-29 DIAGNOSIS — Z20828 Contact with and (suspected) exposure to other viral communicable diseases: Secondary | ICD-10-CM | POA: Diagnosis not present

## 2019-06-30 LAB — NOVEL CORONAVIRUS, NAA (HOSP ORDER, SEND-OUT TO REF LAB; TAT 18-24 HRS): SARS-CoV-2, NAA: NOT DETECTED

## 2019-07-01 ENCOUNTER — Telehealth: Payer: Self-pay | Admitting: Podiatry

## 2019-07-01 NOTE — Telephone Encounter (Signed)
Left voicemail for [name] to call me back to let me know if they received H&P papers on patient for her surgery scheduled for Wednesday 07/03/2019 at [facility].

## 2019-07-02 ENCOUNTER — Encounter (HOSPITAL_BASED_OUTPATIENT_CLINIC_OR_DEPARTMENT_OTHER): Payer: Self-pay | Admitting: *Deleted

## 2019-07-02 ENCOUNTER — Telehealth: Payer: Self-pay | Admitting: Family Medicine

## 2019-07-02 ENCOUNTER — Other Ambulatory Visit: Payer: Self-pay

## 2019-07-02 NOTE — Telephone Encounter (Signed)
Called pt to let her know I got H&P and was in the process of faxing to [facility]. Pt thanked me for staying late to get this taken care of.

## 2019-07-02 NOTE — Telephone Encounter (Signed)
Thank you, please let me know when it is done.

## 2019-07-02 NOTE — Telephone Encounter (Signed)
I do not have an H&P received from anyone, your office or, anyway I don't have anything for this pt. I did see a message where pt had called her PCP and he said he would fax over her ov notes from her physical. That is not good enough. We have to have the H&P form and they can attach that on there but we actually have to have that H&P form to say that she is cleared for surgery. We cannot just have where she had a previous visit and the surgery is on there. That H&P form has be either filled out or they can put, sign at the bottom to say they are okay for surgery and put it with the previous physical that is within the 30 day date.

## 2019-07-02 NOTE — Telephone Encounter (Signed)
Called pt to let her know that as of right now I still have not received the H&P from Dr. Smitty Pluck office. I told her that I told them if we do not get that saying she is cleared for surgery that her surgery would have to be cancelled. Pt stated she would give them a call as well to help push them along. I told her if they did not send anything by 5:30 this evening I would start contacting them again first thing in the morning. Pt thanked me for calling her and letting her know what is going on.

## 2019-07-02 NOTE — Telephone Encounter (Signed)
Patient needs a letter clearing her for surgery. She has had a recent physical.

## 2019-07-02 NOTE — Telephone Encounter (Signed)
Elmo Putt called from Dr. Smitty Pluck office asking about the ov notes and H&P form. Stated on the front page it says they can send clinical's. I told her yes and that I got those, but in order for the pt to be cleared to have surgery, Dr. Margarita Rana has to write on the H&P form that pt is cleared for surgery and to sign the form. Elmo Putt stated she would get that faxed to me today at [phone].

## 2019-07-02 NOTE — Telephone Encounter (Signed)
Called and spoke with [place] to follow up on status of H&P I faxed to Dr. Margarita Rana. Stated I faxed as a STAT request as pt is scheduled for sx tomorrow and if I don't get this back today and fax in that pt's sx will have to be canceled. Stated she would send a message to Dr. Smitty Pluck nurse to follow up.

## 2019-07-02 NOTE — Telephone Encounter (Signed)
I sure will. I got the office notes but faxed the H&P for them to write she's cleared for surgery and to sign and fax back to me so I can get sent to to [facility].

## 2019-07-02 NOTE — Telephone Encounter (Signed)
[name] with [facility] called to check on the status of the STAT HNT for surgical clearance. States their direct fax is [phone] please follow up

## 2019-07-02 NOTE — Telephone Encounter (Signed)
Form has been completed and will be faxed to [place]

## 2019-07-02 NOTE — Telephone Encounter (Signed)
Patient called to check on the status of her letter. States her appointment will be canceled if her surgical clearance letter is not received. Please follow up.

## 2019-07-02 NOTE — Progress Notes (Addendum)
ADDENDUM:  Received pt's pcp h&p via fax from Bethany Jacqualyn Posey office, placed in chart.   Spoke w/ via phone for pre-op interview--- PT  Lab needs dos----  Istat 8 and urine preg COVID test ------ 06-29-2019  Arrive at ------- 1100 NPO after ------ MN w/ exception clear liquids until 0700 (no cream/milk products) then nothing by mouth  Medications to take morning of surgery ----- Lyrica, Cymbalta, Norvasc, Hydralazine, Prilosec w/ sips fo water Diabetic medication ----- n/a  Patient Special Instructions ----- n/a Pre-Op special Istructions ----- Have not received pt's pcp h&p yet. But note in epic progress note in epic from Bethany wagoner office, [name], she taking care h&p getting done and faxed to [place] today.  Patient verbalized understanding of instructions that were given at this phone interview. Patient denies shortness of breath, chest pain, fever, cough a this phone interview.   Anesthesia :  PCP:  [name] (lov in epic 06-12-2019) Cardiologist :  no Chest x-ray : 04-02-2019 epic EKG : 02-28-2017 epic Echo : 12-23-2013 epic Stress test:  no Cardiac Cath :  no Sleep Study/ CPAP : NO  Blood Thinner/ Instructions [name] Dose:  NO ASA / Instructions/ Last Dose :  NO

## 2019-07-02 NOTE — Telephone Encounter (Signed)
Letter has been faxed over. 

## 2019-07-02 NOTE — Telephone Encounter (Signed)
[name] stated she spoke to Dr. Smitty Pluck nurse and would get the form filled out and completed today. I asked her to fax it directly to me at [phone].

## 2019-07-02 NOTE — Telephone Encounter (Signed)
Left message letting [name] know I'm faxing the H&P and notes to [phone]. Told her to call me back if I needed to fax to a different number.

## 2019-07-02 NOTE — Telephone Encounter (Signed)
[name] called to check on the status of the H&P form. She looked in the notes and saw the latest update. I told her I was going to stay since they don't close until 5:30 and if I didn't get anything this evening I would start calling them again first thing tomorrow morning. I told her I explained to them that Dr. Margarita Rana needed to write on the H&P that pt was cleared for surgery and sign it, that just the notes would not be enough. I also told [name] I made Dr. Smitty Pluck office aware that if we did not get the H&P that the pt's surgery would be cancelled.

## 2019-07-03 ENCOUNTER — Ambulatory Visit (HOSPITAL_BASED_OUTPATIENT_CLINIC_OR_DEPARTMENT_OTHER)
Admission: RE | Admit: 2019-07-03 | Discharge: 2019-07-03 | Disposition: A | Discharge status: Home / Self Care | Payer: Medicaid Other | Attending: Podiatry | Admitting: Podiatry

## 2019-07-03 ENCOUNTER — Encounter: Payer: Self-pay | Admitting: Podiatry

## 2019-07-03 ENCOUNTER — Encounter (HOSPITAL_BASED_OUTPATIENT_CLINIC_OR_DEPARTMENT_OTHER)
Admission: RE | Disposition: A | Discharge status: Home / Self Care | Payer: Self-pay | Source: Home / Self Care | Attending: Podiatry

## 2019-07-03 ENCOUNTER — Ambulatory Visit (HOSPITAL_BASED_OUTPATIENT_CLINIC_OR_DEPARTMENT_OTHER): Payer: Medicaid Other | Admitting: Certified Registered"

## 2019-07-03 ENCOUNTER — Encounter (HOSPITAL_BASED_OUTPATIENT_CLINIC_OR_DEPARTMENT_OTHER): Payer: Self-pay

## 2019-07-03 DIAGNOSIS — K219 Gastro-esophageal reflux disease without esophagitis: Secondary | ICD-10-CM | POA: Diagnosis not present

## 2019-07-03 DIAGNOSIS — F1021 Alcohol dependence, in remission: Secondary | ICD-10-CM | POA: Diagnosis not present

## 2019-07-03 DIAGNOSIS — F17210 Nicotine dependence, cigarettes, uncomplicated: Secondary | ICD-10-CM | POA: Diagnosis not present

## 2019-07-03 DIAGNOSIS — G629 Polyneuropathy, unspecified: Secondary | ICD-10-CM | POA: Diagnosis not present

## 2019-07-03 DIAGNOSIS — G8918 Other acute postprocedural pain: Secondary | ICD-10-CM | POA: Diagnosis not present

## 2019-07-03 DIAGNOSIS — X58XXXD Exposure to other specified factors, subsequent encounter: Secondary | ICD-10-CM | POA: Diagnosis not present

## 2019-07-03 DIAGNOSIS — I10 Essential (primary) hypertension: Secondary | ICD-10-CM | POA: Diagnosis not present

## 2019-07-03 DIAGNOSIS — Z881 Allergy status to other antibiotic agents status: Secondary | ICD-10-CM | POA: Insufficient documentation

## 2019-07-03 DIAGNOSIS — S92324A Nondisplaced fracture of second metatarsal bone, right foot, initial encounter for closed fracture: Secondary | ICD-10-CM | POA: Diagnosis not present

## 2019-07-03 DIAGNOSIS — M1990 Unspecified osteoarthritis, unspecified site: Secondary | ICD-10-CM | POA: Diagnosis not present

## 2019-07-03 DIAGNOSIS — S92324K Nondisplaced fracture of second metatarsal bone, right foot, subsequent encounter for fracture with nonunion: Secondary | ICD-10-CM | POA: Insufficient documentation

## 2019-07-03 HISTORY — PX: ORIF TOE FRACTURE: SHX5032

## 2019-07-03 HISTORY — DX: Presence of spectacles and contact lenses: Z97.3

## 2019-07-03 HISTORY — DX: Unspecified osteoarthritis, unspecified site: M19.90

## 2019-07-03 HISTORY — DX: Personal history of diseases of the skin and subcutaneous tissue: Z87.2

## 2019-07-03 HISTORY — DX: Gastro-esophageal reflux disease without esophagitis: K21.9

## 2019-07-03 HISTORY — DX: Fracture of unspecified metatarsal bone(s), unspecified foot, initial encounter for closed fracture: S92.309A

## 2019-07-03 HISTORY — DX: Presence of dental prosthetic device (complete) (partial): Z97.2

## 2019-07-03 LAB — POCT I-STAT, CHEM 8
BUN: 4 mg/dL — ABNORMAL LOW (ref 6–20)
Calcium, Ion: 1.21 mmol/L (ref 1.15–1.40)
Chloride: 97 mmol/L — ABNORMAL LOW (ref 98–111)
Creatinine, Ser: 0.5 mg/dL (ref 0.44–1.00)
Glucose, Bld: 109 mg/dL — ABNORMAL HIGH (ref 70–99)
HCT: 43 % (ref 36.0–46.0)
Hemoglobin: 14.6 g/dL (ref 12.0–15.0)
Potassium: 3.8 mmol/L (ref 3.5–5.1)
Sodium: 139 mmol/L (ref 135–145)
TCO2: 29 mmol/L (ref 22–32)

## 2019-07-03 SURGERY — OPEN REDUCTION INTERNAL FIXATION (ORIF) METATARSAL (TOE) FRACTURE
Anesthesia: General | Site: Toe | Laterality: Right

## 2019-07-03 MED ORDER — DEXAMETHASONE SODIUM PHOSPHATE 10 MG/ML IJ SOLN
INTRAMUSCULAR | Status: AC
  Filled 2019-07-03: qty 1

## 2019-07-03 MED ORDER — CEPHALEXIN 500 MG PO CAPS: 500.0000 mg | ORAL_CAPSULE | Freq: Three times a day (TID) | ORAL | 0 refills | Status: DC

## 2019-07-03 MED ORDER — FENTANYL CITRATE (PF) 100 MCG/2ML IJ SOLN
100.0000 ug | Freq: Once | INTRAMUSCULAR | Status: AC
  Administered 2019-07-03: 50 ug via INTRAVENOUS
  Filled 2019-07-03: qty 2

## 2019-07-03 MED ORDER — BUPIVACAINE-EPINEPHRINE (PF) 0.5% -1:200000 IJ SOLN
INTRAMUSCULAR | Status: DC | PRN
  Administered 2019-07-03: 30 mL via PERINEURAL

## 2019-07-03 MED ORDER — CHLORHEXIDINE GLUCONATE CLOTH 2 % EX PADS
6.0000 | MEDICATED_PAD | Freq: Once | CUTANEOUS | Status: DC
  Filled 2019-07-03: qty 6

## 2019-07-03 MED ORDER — LIDOCAINE 2% (20 MG/ML) 5 ML SYRINGE
INTRAMUSCULAR | Status: AC
  Filled 2019-07-03: qty 5

## 2019-07-03 MED ORDER — FENTANYL CITRATE (PF) 100 MCG/2ML IJ SOLN
INTRAMUSCULAR | Status: AC
  Filled 2019-07-03: qty 2

## 2019-07-03 MED ORDER — CEFAZOLIN SODIUM-DEXTROSE 2-4 GM/100ML-% IV SOLN
2.0000 g | INTRAVENOUS | Status: AC
  Administered 2019-07-03: 2 g via INTRAVENOUS
  Filled 2019-07-03: qty 100

## 2019-07-03 MED ORDER — ONDANSETRON HCL 4 MG/2ML IJ SOLN
INTRAMUSCULAR | Status: AC
  Filled 2019-07-03: qty 2

## 2019-07-03 MED ORDER — ONDANSETRON HCL 4 MG/2ML IJ SOLN
INTRAMUSCULAR | Status: DC | PRN
  Administered 2019-07-03: 4 mg via INTRAVENOUS

## 2019-07-03 MED ORDER — LIDOCAINE 2% (20 MG/ML) 5 ML SYRINGE
INTRAMUSCULAR | Status: DC | PRN
  Administered 2019-07-03: 60 mg via INTRAVENOUS

## 2019-07-03 MED ORDER — MIDAZOLAM HCL 2 MG/2ML IJ SOLN
2.0000 mg | Freq: Once | INTRAMUSCULAR | Status: AC
  Administered 2019-07-03: 13:00:00 2 mg via INTRAVENOUS
  Filled 2019-07-03: qty 2

## 2019-07-03 MED ORDER — PROMETHAZINE HCL 25 MG PO TABS: 25.0000 mg | ORAL_TABLET | Freq: Three times a day (TID) | ORAL | 0 refills | Status: DC | PRN

## 2019-07-03 MED ORDER — OXYCODONE HCL 5 MG PO TABS
5.0000 mg | ORAL_TABLET | Freq: Once | ORAL | Status: DC | PRN
  Filled 2019-07-03: qty 1

## 2019-07-03 MED ORDER — OXYCODONE HCL 5 MG/5ML PO SOLN
5.0000 mg | Freq: Once | ORAL | Status: DC | PRN
  Filled 2019-07-03: qty 5

## 2019-07-03 MED ORDER — PROMETHAZINE HCL 25 MG/ML IJ SOLN
6.2500 mg | INTRAMUSCULAR | Status: DC | PRN
  Filled 2019-07-03: qty 1

## 2019-07-03 MED ORDER — FENTANYL CITRATE (PF) 100 MCG/2ML IJ SOLN
INTRAMUSCULAR | Status: DC | PRN
  Administered 2019-07-03: 50 ug via INTRAVENOUS

## 2019-07-03 MED ORDER — OXYCODONE-ACETAMINOPHEN 5-325 MG PO TABS: 1.0000 | ORAL_TABLET | Freq: Four times a day (QID) | ORAL | 0 refills | Status: DC | PRN

## 2019-07-03 MED ORDER — FENTANYL CITRATE (PF) 100 MCG/2ML IJ SOLN
25.0000 ug | INTRAMUSCULAR | Status: DC | PRN
  Filled 2019-07-03: qty 1

## 2019-07-03 MED ORDER — CEFAZOLIN SODIUM-DEXTROSE 2-4 GM/100ML-% IV SOLN
INTRAVENOUS | Status: AC
  Filled 2019-07-03: qty 100

## 2019-07-03 MED ORDER — DEXAMETHASONE SODIUM PHOSPHATE 10 MG/ML IJ SOLN
INTRAMUSCULAR | Status: DC | PRN
  Administered 2019-07-03: 10 mg via INTRAVENOUS

## 2019-07-03 MED ORDER — MIDAZOLAM HCL 2 MG/2ML IJ SOLN
INTRAMUSCULAR | Status: AC
  Filled 2019-07-03: qty 2

## 2019-07-03 MED ORDER — PROPOFOL 10 MG/ML IV BOLUS
INTRAVENOUS | Status: DC | PRN
  Administered 2019-07-03: 160 mg via INTRAVENOUS

## 2019-07-03 MED ORDER — LACTATED RINGERS IV SOLN
INTRAVENOUS | Status: DC
  Administered 2019-07-03: 12:00:00 via INTRAVENOUS
  Filled 2019-07-03: qty 1000

## 2019-07-03 SURGICAL SUPPLY — 69 items
1.6mm threaded holding tak ×1 IMPLANT
2.0mm short drill ×1 IMPLANT
BANDAGE ELASTIC 3 VELCRO ST LF (GAUZE/BANDAGES/DRESSINGS) ×1 IMPLANT
BIT DRILL SHORT  2.0 (BIT) ×1
BIT DRILL SRG SHRT 2XPILT FT (BIT) IMPLANT
BIT DRL SRG SHRT 2XPILT FT (BIT) ×2
BLADE SURG 15 STRL LF DISP TIS (BLADE) ×4 IMPLANT
BLADE SURG 15 STRL SS (BLADE) ×2
BNDG CONFORM 2 STRL LF (GAUZE/BANDAGES/DRESSINGS) ×3 IMPLANT
BNDG ELASTIC 3X5.8 VLCR STR LF (GAUZE/BANDAGES/DRESSINGS) ×3 IMPLANT
BNDG ELASTIC 4X5.8 VLCR STR LF (GAUZE/BANDAGES/DRESSINGS) ×6 IMPLANT
BNDG ESMARK 4X9 LF (GAUZE/BANDAGES/DRESSINGS) ×2 IMPLANT
BNDG GAUZE ELAST 4 BULKY (GAUZE/BANDAGES/DRESSINGS) ×3 IMPLANT
COVER TABLE BACK 60X90 (DRAPES) ×3 IMPLANT
COVER WAND RF STERILE (DRAPES) ×1 IMPLANT
CUFF TOURN SGL QUICK 18X4 (TOURNIQUET CUFF) ×1 IMPLANT
DRAPE EXTREMITY T 121X128X90 (DISPOSABLE) ×3 IMPLANT
DRAPE IMP U-DRAPE 54X76 (DRAPES) ×3 IMPLANT
DRAPE OEC MINIVIEW 54X84 (DRAPES) ×3 IMPLANT
DRAPE SHEET LG 3/4 BI-LAMINATE (DRAPES) ×1 IMPLANT
DRAPE SURG 17X23 STRL (DRAPES) ×3 IMPLANT
DRSG EMULSION OIL 3X3 NADH (GAUZE/BANDAGES/DRESSINGS) ×3 IMPLANT
DURAPREP 26ML APPLICATOR (WOUND CARE) ×3 IMPLANT
ELECT REM PT RETURN 9FT ADLT (ELECTROSURGICAL) ×3
ELECTRODE REM PT RTRN 9FT ADLT (ELECTROSURGICAL) ×2 IMPLANT
GAUZE 4X4 16PLY RFD (DISPOSABLE) IMPLANT
GAUZE SPONGE 4X4 12PLY STRL (GAUZE/BANDAGES/DRESSINGS) ×3 IMPLANT
GAUZE SPONGE 4X4 8PLY STR LF (GAUZE/BANDAGES/DRESSINGS) ×1 IMPLANT
GLOVE BIO SURGEON STRL SZ7.5 (GLOVE) ×6 IMPLANT
GLOVE BIO SURGEON STRL SZ8 (GLOVE) IMPLANT
GLOVE BIOGEL PI IND STRL 8 (GLOVE) IMPLANT
GLOVE BIOGEL PI INDICATOR 8 (GLOVE)
GOWN STRL REUS W/ TWL XL LVL3 (GOWN DISPOSABLE) ×2 IMPLANT
GOWN STRL REUS W/TWL XL LVL3 (GOWN DISPOSABLE) ×1
GUIDEWIRE .045XTROC TIP LSR LN (WIRE) IMPLANT
K-WIRE 1.1 (WIRE) ×1
NDL HYPO 25X1 1.5 SAFETY (NEEDLE) ×2 IMPLANT
NDL SAFETY ECLIPSE 18X1.5 (NEEDLE) IMPLANT
NEEDLE HYPO 18GX1.5 SHARP (NEEDLE)
NEEDLE HYPO 25X1 1.5 SAFETY (NEEDLE) ×3 IMPLANT
NS IRRIG 1000ML POUR BTL (IV SOLUTION) IMPLANT
PACK BASIN DAY SURGERY FS (CUSTOM PROCEDURE TRAY) ×3 IMPLANT
PADDING CAST ABS 4INX4YD NS (CAST SUPPLIES) ×2
PADDING CAST ABS COTTON 4X4 ST (CAST SUPPLIES) ×4 IMPLANT
PENCIL BUTTON HOLSTER BLD 10FT (ELECTRODE) ×3 IMPLANT
PLATE STRAIGHT 5H (Plate) ×1 IMPLANT
PUTTY DBM STAGRAFT PLUS 5CC (Putty) ×1 IMPLANT
SCREW LOCK T8 16X2.7X30X2 LD (Screw) IMPLANT
SCREW LOCK T8 20X2.7X2 LD NS (Screw) IMPLANT
SCREW LOCKING 1.7X10 (Screw) ×2 IMPLANT
SCREW LOCKING 2.7X16 (Screw) ×1 IMPLANT
SCREW LOCKING 2.7X20 (Screw) ×1 IMPLANT
SCREW NLOCK 2.7X16 (Screw) ×2 IMPLANT
SPLINT FIBERGLASS 4X30 (CAST SUPPLIES) ×2 IMPLANT
STAPLER VISISTAT 35W (STAPLE) IMPLANT
STOCKINETTE 6  STRL (DRAPES) ×1
STOCKINETTE 6 STRL (DRAPES) ×2 IMPLANT
SUT MNCRL AB 3-0 PS2 18 (SUTURE) ×3 IMPLANT
SUT MNCRL AB 4-0 PS2 18 (SUTURE) ×3 IMPLANT
SUT MON AB 2-0 SH 27 (SUTURE) ×1
SUT MON AB 2-0 SH27 (SUTURE) IMPLANT
SUT MON AB 5-0 PS2 18 (SUTURE) IMPLANT
SUT PROLENE 4 0 PS 2 18 (SUTURE) ×2 IMPLANT
SUT VICRYL AB 2 0 TIE (SUTURE) IMPLANT
SUT VICRYL AB 2 0 TIES (SUTURE) ×1
SYR 10ML LL (SYRINGE) IMPLANT
SYR BULB 3OZ (MISCELLANEOUS) ×3 IMPLANT
TAK THREADED HOLDING 1.6 (MISCELLANEOUS) ×1 IMPLANT
UNDERPAD 30X36 HEAVY ABSORB (UNDERPADS AND DIAPERS) ×3 IMPLANT

## 2019-07-03 NOTE — Progress Notes (Signed)
Assisted Dr. Brock with right, ultrasound guided, popliteal block. Side rails up, monitors on throughout procedure. See vital signs in flow sheet. Tolerated Procedure well. 

## 2019-07-03 NOTE — Transfer of Care (Signed)
Immediate Anesthesia Transfer of Care Note  Patient: [name]  Procedure(s) Performed: OPEN REDUCTION INTERNAL FIXATION (ORIF) METATARSAL (TOE) FRACTURE (Right Toe) BONE GRAFTR (Right Toe)  Patient Location: PACU  Anesthesia Type:General  Level of Consciousness: awake, alert , oriented and patient cooperative  Airway & Oxygen Therapy: Patient Spontanous Breathing and Patient connected to nasal cannula oxygen  Post-op Assessment: Report given to RN and Post -op Vital signs reviewed and stable  Post vital signs: Reviewed and stable  Last Vitals:  Vitals Value Taken Time  BP    Temp    Pulse    Resp    SpO2      Last Pain:  Vitals:   07/03/19 1151  TempSrc: Oral  PainSc: 8       Patients Stated Pain Goal: 3 ([id number])  Complications: No apparent anesthesia complications

## 2019-07-03 NOTE — Anesthesia Procedure Notes (Signed)
Procedure Name: LMA Insertion Date/Time: 07/03/2019 1:51 PM Performed by: Suan Halter, CRNA Pre-anesthesia Checklist: Patient identified, Emergency Drugs available, Suction available and Patient being monitored Patient Re-evaluated:Patient Re-evaluated prior to induction Oxygen Delivery Method: Circle system utilized Preoxygenation: Pre-oxygenation with 100% oxygen Induction Type: IV induction Ventilation: Mask ventilation without difficulty LMA: LMA inserted LMA Size: 4.0 Number of attempts: 1 Airway Equipment and Method: Bite block Placement Confirmation: positive ETCO2 Tube secured with: Tape Dental Injury: Teeth and Oropharynx as per pre-operative assessment

## 2019-07-03 NOTE — Anesthesia Procedure Notes (Signed)
Anesthesia Regional Block: Popliteal block   Pre-Anesthetic Checklist: ,, timeout performed, Correct Patient, Correct Site, Correct Laterality, Correct Procedure, Correct Position, site marked, Risks and benefits discussed,  Surgical consent,  Pre-op evaluation,  At surgeon's request and post-op pain management  Laterality: Right  Prep: chloraprep       Needles:  Injection technique: Single-shot  Needle Type: Echogenic Needle     Needle Length: 10cm  Needle Gauge: 21     Additional Needles:   Narrative:  Start time: 07/03/2019 12:45 PM End time: 07/03/2019 12:49 PM Injection made incrementally with aspirations every 5 mL.  Performed by: Personally  Anesthesiologist: Audry Pili, MD  Additional Notes: No pain on injection. No increased resistance to injection. Injection made in 5cc increments. Good needle visualization. Patient tolerated the procedure well.

## 2019-07-03 NOTE — Discharge Instructions (Signed)
See written instructions Keep same home medications as prior except stop the vicodin  Acetaminophen; Oxycodone tablets What is this medicine? ACETAMINOPHEN; OXYCODONE (a set a MEE noe fen; ox i KOE done) is a pain reliever. It is used to treat moderate to severe pain. This medicine may be used for other purposes; ask your health care provider or pharmacist if you have questions. COMMON BRAND NAME(S): Endocet, Magnacet, Nalocet, Narvox, Percocet, Perloxx, Primalev, Primlev, Roxicet, Xolox What should I tell my health care provider before I take this medicine? They need to know if you have any of these conditions:  brain tumor  Crohn's disease, inflammatory bowel disease, or ulcerative colitis  drug abuse or addiction  head injury  heart or circulation problems  if you often drink alcohol  kidney disease or problems going to the bathroom  liver disease  lung disease, asthma, or breathing problems  an unusual or allergic reaction to acetaminophen, oxycodone, other opioid analgesics, other medicines, foods, dyes, or preservatives  pregnant or trying to get pregnant  breast-feeding How should I use this medicine? Take this medicine by mouth with a full glass of water. Follow the directions on the prescription label. You can take it with or without food. If it upsets your stomach, take it with food. Take your medicine at regular intervals. Do not take it more often than directed. A special MedGuide will be given to you by the pharmacist with each prescription and refill. Be sure to read this information carefully each time. Talk to your pediatrician regarding the use of this medicine in children. Special care may be needed. Overdosage: If you think you have taken too much of this medicine contact a poison control center or emergency room at once. NOTE: This medicine is only for you. Do not share this medicine with others. What if I miss a dose? If you miss a dose, take it as soon  as you can. If it is almost time for your next dose, take only that dose. Do not take double or extra doses. What may interact with this medicine? This medicine may interact with the following medications:  alcohol  antihistamines for allergy, cough and cold  antiviral medicines for HIV or AIDS  atropine  certain antibiotics like clarithromycin, erythromycin, linezolid, rifampin  certain medicines for anxiety or sleep  certain medicines for bladder problems like oxybutynin, tolterodine  certain medicines for depression like amitriptyline, fluoxetine, sertraline  certain medicines for fungal infections like ketoconazole, itraconazole, voriconazole  certain medicines for migraine headache like almotriptan, eletriptan, frovatriptan, naratriptan, rizatriptan, sumatriptan, zolmitriptan  certain medicines for nausea or vomiting like dolasetron, ondansetron, palonosetron  certain medicines for Parkinson's disease like benztropine, trihexyphenidyl  certain medicines for seizures like phenobarbital, phenytoin, primidone  certain medicines for stomach problems like dicyclomine, hyoscyamine  certain medicines for travel sickness like scopolamine  diuretics  general anesthetics like halothane, isoflurane, methoxyflurane, propofol  ipratropium  local anesthetics like lidocaine, pramoxine, tetracaine  MAOIs like Carbex, Eldepryl, Marplan, Nardil, and Parnate  medicines that relax muscles for surgery  methylene blue  nilotinib  other medicines with acetaminophen  other narcotic medicines for pain or cough  phenothiazines like chlorpromazine, mesoridazine, prochlorperazine, thioridazine This list may not describe all possible interactions. Give your health care provider a list of all the medicines, herbs, non-prescription drugs, or dietary supplements you use. Also tell them if you smoke, drink alcohol, or use illegal drugs. Some items may interact with your medicine. What  should I watch for while using  this medicine? Tell your doctor or health care professional if your pain does not go away, if it gets worse, or if you have new or a different type of pain. You may develop tolerance to the medicine. Tolerance means that you will need a higher dose of the medication for pain relief. Tolerance is normal and is expected if you take this medicine for a long time. Do not suddenly stop taking your medicine because you may develop a severe reaction. Your body becomes used to the medicine. This does NOT mean you are addicted. Addiction is a behavior related to getting and using a drug for a non-medical reason. If you have pain, you have a medical reason to take pain medicine. Your doctor will tell you how much medicine to take. If your doctor wants you to stop the medicine, the dose will be slowly lowered over time to avoid any side effects. There are different types of narcotic medicines (opiates). If you take more than one type at the same time or if you are taking another medicine that also causes drowsiness, you may have more side effects. Give your health care provider a list of all medicines you use. Your doctor will tell you how much medicine to take. Do not take more medicine than directed. Call emergency for help if you have problems breathing or unusual sleepiness. Do not take other medicines that contain acetaminophen with this medicine. Always read labels carefully. If you have questions, ask your doctor or pharmacist. If you take too much acetaminophen get medical help right away. Too much acetaminophen can be very dangerous and cause liver damage. Even if you do not have symptoms, it is important to get help right away. You may get drowsy or dizzy. Do not drive, use machinery, or do anything that needs mental alertness until you know how this medicine affects you. Do not stand or sit up quickly, especially if you are an older patient. This reduces the risk of dizzy or  fainting spells. Alcohol may interfere with the effect of this medicine. Avoid alcoholic drinks. The medicine will cause constipation. Try to have a bowel movement at least every 2 to 3 days. If you do not have a bowel movement for 3 days, call your doctor or health care professional. Your mouth may get dry. Chewing sugarless gum or sucking hard candy, and drinking plenty or water may help. Contact your doctor if the problem does not go away or is severe. What side effects may I notice from receiving this medicine? Side effects that you should report to your doctor or health care professional as soon as possible:  allergic reactions like skin rash, itching or hives, swelling of the face, lips, or tongue  breathing problems  confusion  redness, blistering, peeling or loosening of the skin, including inside the mouth  signs and symptoms of liver injury like dark yellow or brown urine; general ill feeling or flu-like symptoms; light-colored stools; loss of appetite; nausea; right upper belly pain; unusually weak or tired; yellowing of the eyes or skin  signs and symptoms of low blood pressure like dizziness; feeling faint or lightheaded, falls; unusually weak or tired  trouble passing urine or change in the amount of urine Side effects that usually do not require medical attention (report to your doctor or health care professional if they continue or are bothersome):  constipation  dry mouth  nausea, vomiting  tiredness This list may not describe all possible side effects. Call your doctor for  medical advice about side effects. You may report side effects to FDA at 1-800-FDA-1088. Where should I keep my medicine? Keep out of the reach of children. This medicine can be abused. Keep your medicine in a safe place to protect it from theft. Do not share this medicine with anyone. Selling or giving away this medicine is dangerous and against the law. Store at room temperature between 20 and 25  degrees C (68 and 77 degrees F). This medicine may cause harm and death if it is taken by other adults, children, or pets. Return medicine that has not been used to an official disposal site. Contact the DEA at 256-868-6800 or your city/county government to find a site. If you cannot return the medicine, flush it down the toilet. Do not use the medicine after the expiration date. NOTE: This sheet is a summary. It may not cover all possible information. If you have questions about this medicine, talk to your doctor, pharmacist, or health care provider.  2020 Elsevier/Gold Standard (2017-01-03 15:46:38)    Metatarsal Fracture A metatarsal fracture is a break in one of the five bones that connect the toes to the rest of the foot. This may also be called a forefoot fracture. A metatarsal fracture may be:  A crack in the surface of the bone (stress fracture). This often occurs in athletes.  A break all the way through the bone (complete fracture). The bone that connects to the little toe (fifth metatarsal) is most commonly fractured. Ballet dancers often fracture this bone. What are the causes? A metatarsal fracture may be caused by:  Sudden twisting of the foot.  Falling onto the foot.  Something heavy falling onto the foot.  Overuse or repetitive exercise. What increases the risk? This condition is more likely to develop in people who:  Play contact sports.  Do ballet.  Have a condition that causes the bones to become thin and brittle (osteoporosis).  Have a low calcium level. What are the signs or symptoms? Symptoms of this condition include:  Pain that gets worse when walking or standing.  Pain when pressing on the foot or moving the toes.  Swelling.  Bruising on the top or bottom of the foot. How is this diagnosed? This condition may be diagnosed based on:  Your symptoms.  Any recent foot injuries you have had.  A physical exam.  An X-ray of your foot. If you  have a stress fracture, it may not show up on an X-ray, and you may need other imaging tests, such as: ? A bone scan. ? CT scan. ? MRI. How is this treated? Treatment depends on how severe your fracture is and how the pieces of the broken bone line up with each other (alignment). Treatment may involve:  Wearing a cast, splint, or supportive boot on your foot.  Using crutches, and not putting any weight on your foot.  Having surgery to align broken bones (open reduction and internal fixation, ORIF).  Physical therapy.  Follow-up visits and X-rays to make sure you are healing. Follow these instructions at home: If you have a splint or a supportive boot:  Wear the splint or boot as told by your health care provider. Remove it only as told by your health care provider.  Loosen the splint or boot if your toes tingle, become numb, or turn cold and blue.  Keep the splint or boot clean.  If your splint or boot is not waterproof: ? Do not let it get  wet. ? Cover it with a watertight covering when you take a bath or a shower. If you have a cast:  Do not stick anything inside the cast to scratch your skin. Doing that increases your risk for infection.  Check the skin around the cast every day. Tell your health care provider about any concerns.  You may put lotion on dry skin around the edges of the cast. Do not put lotion on the skin underneath the cast.  Keep the cast clean.  If the cast is not waterproof: ? Do not let it get wet. ? Cover it with a watertight covering when you take a bath or a shower. Activity  Do not use your affected leg to support your body weight until your health care provider says that you can. Use crutches as directed.  Ask your health care provider what activities are safe for you during recovery, and ask what activities you need to avoid.  Do physical therapy exercises as directed. Driving  Do not drive or use heavy machinery while taking pain  medicine.  Do not drive while wearing a cast, splint, or boot on a foot that you use for driving. Managing pain, stiffness, and swelling   If directed, put ice on painful areas: ? Put ice in a plastic bag. ? Place a towel between your skin and the bag.  If you have a removable splint or boot, remove it as told by your health care provider.  If you have a cast, place a towel between your cast and the bag. ? Leave the ice on for 20 minutes, 2-3 times a day.  Move your toes often to avoid stiffness and to lessen swelling.  Raise (elevate) your lower leg above the level of your heart while you are sitting or lying down. General instructions  Do not put pressure on any part of the cast or splint until it is fully hardened. This may take several hours.  Take over-the-counter and prescription medicines only as told by your health care provider.  Do not use any products that contain nicotine or tobacco, such as cigarettes and e-cigarettes. These can delay bone healing. If you need help quitting, ask your health care provider.  Do not take baths, swim, or use a hot tub until your health care provider approves. Ask your health care provider if you may take showers.  Keep all follow-up visits as told by your health care provider. This is important. Contact a health care provider if you have:  Pain that gets worse or does not get better with medicine.  A fever.  A bad smell coming from your cast or splint. Get help right away if you have:  Any of the following in your toes or your foot, even after loosening your splint (if applicable): ? Numbness. ? Tingling. ? Coldness. ? Blue skin.  Redness or swelling that gets worse.  Pain that suddenly becomes severe. Summary  A metatarsal fracture is a break in one of the five bones that connect the toes to the rest of the foot.  Treatment depends on how severe your fracture is and how the pieces of the broken bone line up with each other  (alignment). This may include wearing a cast, splint, or supportive boot, or using crutches. Sometimes surgery is needed to align the bones.  Ice and elevate your foot to help lessen the pain and swelling.  Make sure you know what symptoms should cause you to get help right away. This  information is not intended to replace advice given to you by your health care provider. Make sure you discuss any questions you have with your health care provider. Document Released: 05/21/2002 Document Revised: 12/20/2018 Document Reviewed: 09/25/2017 Elsevier Patient Education  [place] Instructions  Activity: Get plenty of rest for the remainder of the day. A responsible individual must stay with you for 24 hours following the procedure.  For the next 24 hours, DO NOT: -Drive a car -Paediatric nurse -Drink alcoholic beverages -Take any medication unless instructed by your physician -Make any legal decisions or sign important papers.  Meals: Start with liquid foods such as gelatin or soup. Progress to regular foods as tolerated. Avoid greasy, spicy, heavy foods. If nausea and/or vomiting occur, drink only clear liquids until the nausea and/or vomiting subsides. Call your physician if vomiting continues.  Special Instructions/Symptoms: Your throat may feel dry or sore from the anesthesia or the breathing tube placed in your throat during surgery. If this causes discomfort, gargle with warm salt water. The discomfort should disappear within 24 hours.  If you had a scopolamine patch placed behind your ear for the management of post- operative nausea and/or vomiting:  1. The medication in the patch is effective for 72 hours, after which it should be removed.  Wrap patch in a tissue and discard in the trash. Wash hands thoroughly with soap and water. 2. You may remove the patch earlier than 72 hours if you experience unpleasant side effects which may include dry  mouth, dizziness or visual disturbances. 3. Avoid touching the patch. Wash your hands with soap and water after contact with the patch.  Regional Anesthesia Blocks  1. Numbness or the inability to move the "blocked" extremity may last from 3-48 hours after placement. The length of time depends on the medication injected and your individual response to the medication. If the numbness is not going away after 48 hours, call your surgeon.  2. The extremity that is blocked will need to be protected until the numbness is gone and the  Strength has returned. Because you cannot feel it, you will need to take extra care to avoid injury. Because it may be weak, you may have difficulty moving it or using it. You may not know what position it is in without looking at it while the block is in effect.  3. For blocks in the legs and feet, returning to weight bearing and walking needs to be done carefully. You will need to wait until the numbness is entirely gone and the strength has returned. You should be able to move your leg and foot normally before you try and bear weight or walk. You will need someone to be with you when you first try to ensure you do not fall and possibly risk injury.  4. Bruising and tenderness at the needle site are common side effects and will resolve in a few days.  5. Persistent numbness or new problems with movement should be communicated to the surgeon or the [place] [phone] [place] [phone]).

## 2019-07-03 NOTE — Anesthesia Preprocedure Evaluation (Addendum)
Anesthesia Evaluation  Patient identified by MRN, date of birth, ID band Patient awake    Reviewed: Allergy & Precautions, NPO status , Patient's Chart, lab work & pertinent test results  History of Anesthesia Complications Negative for: history of anesthetic complications  Airway Mallampati: II  TM Distance: >3 FB Neck ROM: Full    Dental  (+) Upper Dentures, Dental Advisory Given   Pulmonary Current Smoker and Patient abstained from smoking.,    Pulmonary exam normal        Cardiovascular hypertension, Pt. on medications Normal cardiovascular exam     Neuro/Psych  Neuromuscular disease (neuropathy) negative psych ROS   GI/Hepatic GERD  Medicated and Controlled,(+)     substance abuse  alcohol use,   Endo/Other  negative endocrine ROS  Renal/GU negative Renal ROS     Musculoskeletal  (+) Arthritis ,   Abdominal   Peds  Hematology negative hematology ROS (+)   Anesthesia Other Findings   Reproductive/Obstetrics  S/p tubal ligation                             Anesthesia Physical Anesthesia Plan  ASA: III  Anesthesia Plan: General   Post-op Pain Management:  Regional for Post-op pain   Induction: Intravenous  PONV Risk Score and Plan: 3 and Treatment may vary due to age or medical condition, Ondansetron, Dexamethasone and Midazolam  Airway Management Planned: LMA  Additional Equipment: None  Intra-op Plan:   Post-operative Plan: Extubation in OR  Informed Consent: I have reviewed the patients History and Physical, chart, labs and discussed the procedure including the risks, benefits and alternatives for the proposed anesthesia with the patient or authorized representative who has indicated his/her understanding and acceptance.     Dental advisory given  Plan Discussed with: CRNA and Anesthesiologist  Anesthesia Plan Comments:        Anesthesia Quick  Evaluation

## 2019-07-03 NOTE — H&P (Signed)
Patient seen in pre-op holding. 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 Discussed the surgery as well as the postop course and discussed all alteratives, risks, complications. She wishes to proceed. NPO. Surgical consent signed. Will proceed as scheduled  CT scan 1. 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 Interval healing of the intra-articular third metatarsal base fracture. 3. Prior healed fracture deformity of the second proximal phalanx.

## 2019-07-03 NOTE — Brief Op Note (Signed)
07/03/2019  3:22 PM  PATIENT:  [name]  48 y.o. female  PRE-OPERATIVE DIAGNOSIS:  A CLOSED NON DISPLACE FRACTURE 2ND METATARSAL  POST-OPERATIVE DIAGNOSIS:  A CLOSED NON DISPLACE FRACTURE 2ND METATARSAL  PROCEDURE:  Procedure(s) with comments: OPEN REDUCTION INTERNAL FIXATION (ORIF) METATARSAL (TOE) FRACTURE (Right) - LEG BLOCK BONE GRAFTR (Right)  SURGEON:  Surgeon(s) and Role:    * Trula Slade, [name] - Primary    * Felipa Furnace, [name] - Assisting  PHYSICIAN ASSISTANT:   ASSISTANTS: none   ANESTHESIA:   general  EBL:  Minimal    BLOOD ADMINISTERED:none  DRAINS: none   LOCAL MEDICATIONS USED:  NONE  SPECIMEN:  No Specimen  DISPOSITION OF SPECIMEN:  none  COUNTS:  YES  TOURNIQUET:   Total Tourniquet Time Documented: Calf (Right) - 68 minutes Total: Calf (Right) - 68 minutes   DICTATION: .Viviann Spare Dictation  PLAN OF CARE: Discharge to home after PACU  PATIENT DISPOSITION:  PACU - hemodynamically stable.   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 Resection of the nonunion with ORIF. After plating minimal gapping. Used DBM to fill the small void.

## 2019-07-04 NOTE — Addendum Note (Signed)
Addendum  created 07/04/19 1202 by Wanita Chamberlain, CRNA   Charge Capture section accepted

## 2019-07-04 NOTE — Anesthesia Postprocedure Evaluation (Signed)
Anesthesia Post Note  Patient: [name]  Procedure(s) Performed: OPEN REDUCTION INTERNAL FIXATION (ORIF) METATARSAL (TOE) FRACTURE (Right Toe)     Patient location during evaluation: PACU Anesthesia Type: General Level of consciousness: awake Pain management: pain level controlled Vital Signs Assessment: post-procedure vital signs reviewed and stable Respiratory status: spontaneous breathing Cardiovascular status: stable Postop Assessment: no apparent nausea or vomiting Anesthetic complications: no    Last Vitals:  Vitals:   07/03/19 1600 07/03/19 1630  BP: 117/78 116/78  Pulse: 79 78  Resp: 13 14  Temp:  36.4 C  SpO2: 93% 97%    Last Pain:  Vitals:   07/03/19 1630  TempSrc:   PainSc: 0-No pain   Pain Goal: Patients Stated Pain Goal: 3 (07/03/19 1151)                 Huston Foley

## 2019-07-05 ENCOUNTER — Telehealth: Payer: Self-pay | Admitting: *Deleted

## 2019-07-05 ENCOUNTER — Encounter (HOSPITAL_BASED_OUTPATIENT_CLINIC_OR_DEPARTMENT_OTHER): Payer: Self-pay | Admitting: Podiatry

## 2019-07-05 NOTE — Telephone Encounter (Signed)
Called and spoke with the patient and patient stated that she was doing alright and feels sore today and there is not any swelling that the patient can see and there was not any fever or chills and no nausea and took the pain medicine at 1 pm today and is taken two tablets at a time and the bandage seems to be ok and the toes are sore as well and I stated to call the [place] office if any concerns or questions at [phone]. [name]

## 2019-07-08 ENCOUNTER — Other Ambulatory Visit: Payer: Self-pay | Admitting: Podiatry

## 2019-07-09 MED ORDER — OXYCODONE-ACETAMINOPHEN 5-325 MG PO TABS: 1.0000 | ORAL_TABLET | Freq: Four times a day (QID) | ORAL | 0 refills | Status: DC | PRN

## 2019-07-11 ENCOUNTER — Ambulatory Visit (INDEPENDENT_AMBULATORY_CARE_PROVIDER_SITE_OTHER): Payer: Medicaid Other | Admitting: Podiatry

## 2019-07-11 ENCOUNTER — Encounter: Payer: Self-pay | Admitting: Podiatry

## 2019-07-11 ENCOUNTER — Ambulatory Visit (INDEPENDENT_AMBULATORY_CARE_PROVIDER_SITE_OTHER): Payer: Medicaid Other

## 2019-07-11 ENCOUNTER — Other Ambulatory Visit: Payer: Self-pay

## 2019-07-11 DIAGNOSIS — S92901D Unspecified fracture of right foot, subsequent encounter for fracture with routine healing: Secondary | ICD-10-CM

## 2019-07-11 DIAGNOSIS — M79671 Pain in right foot: Secondary | ICD-10-CM

## 2019-07-11 DIAGNOSIS — M216X1 Other acquired deformities of right foot: Secondary | ICD-10-CM | POA: Diagnosis not present

## 2019-07-12 NOTE — Progress Notes (Signed)
Subjective: [name] is a 48 y.o. is seen today in office s/p right foot second metatarsal ORIF preformed on 07/03/2019.  Overall she states her pain is improving.  She still taking Percocet and she received a refill this yesterday.  She is been nonweightbearing.  She does take the boot off to sleep.  Denies any systemic complaints such as fevers, chills, nausea, vomiting. No calf pain, chest pain, shortness of breath.   Objective: General: No acute distress, AAOx3  DP/PT pulses palpable 2/4, CRT < 3 sec to all digits.  Protective sensation intact. Motor function intact.  RIGHT foot: Incision is well coapted without any evidence of dehiscence and sutures are intact. There is no surrounding erythema, ascending cellulitis, fluctuance, crepitus, malodor, drainage/purulence. There is mild edema around the surgical site. There is mild pain along the surgical site.  No other areas of tenderness to bilateral lower extremities.  No other open lesions or pre-ulcerative lesions.  No pain with calf compression, swelling, warmth, erythema.   Assessment and Plan:  Status post right second metatarsal ORIF, doing well with no complications   -Treatment options discussed including all alternatives, risks, and complications -X-rays reviewed.  Hardware intact.  No evidence of acute fracture. -Antibiotic ointment and a dressing was applied.  Keep dressing clean, dry, intact. -Remain nonweightbearing -Wear cam boot at all times even while sleeping. -Ice/elevation -Pain medication as needed. -Monitor for any clinical signs or symptoms of infection and DVT/PE and directed to call the office immediately should any occur or go to the ER. -Follow-up as scheduled for possible suture removal or sooner if any problems arise. In the meantime, encouraged to call the office with any questions, concerns, change in symptoms.   [name], [name]

## 2019-07-12 NOTE — Op Note (Signed)
PATIENT:  [name]  48 y.o. female  PRE-OPERATIVE DIAGNOSIS:  A CLOSED NON DISPLACE FRACTURE 2ND METATARSAL  POST-OPERATIVE DIAGNOSIS:  A CLOSED NON DISPLACE FRACTURE 2ND METATARSAL  PROCEDURE:  Procedure(s) with comments: OPEN REDUCTION INTERNAL FIXATION (ORIF) METATARSAL (TOE) FRACTURE (Right) - LEG BLOCK BONE GRAFTR (Right)  SURGEON:  Surgeon(s) and Role:    * Trula Slade, [name] - Primary    * Felipa Furnace, [name] - Assisting  PHYSICIAN ASSISTANT:   ASSISTANTS: none   ANESTHESIA:   general  EBL:  Minimal    BLOOD ADMINISTERED:none  DRAINS: none   LOCAL MEDICATIONS USED:  NONE  SPECIMEN:  No Specimen  DISPOSITION OF SPECIMEN:  none  COUNTS:  YES  TOURNIQUET:   Total Tourniquet Time Documented: Calf (Right) - 68 minutes Total: Calf (Right) - 68 minutes   DICTATION: .Viviann Spare Dictation  PLAN OF CARE: Discharge to home after PACU  PATIENT DISPOSITION:  PACU - hemodynamically stable.   Delay start of Pharmacological VTE agent (>24hrs) due to surgical blood loss or risk of bleeding: no  Indication for surgery: Patient originally had an injury to her right foot and multiple fractures of the metatarsals are noted.  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  Despite conservative treatments she still had a nonunion and pain.  Because of this discussed surgical intervention.  Discussed repair of the nonunion with ORIF.  Alternatives, risks, complications were discussed.  No promises or guarantees were given affecting the procedure and all questions were answered to the best my ability.  Procedure in detail: The patient was both verbally and visually identified in the preoperative holding area.  Preoperatively she had a nerve block performed with anesthesia staff.  She was then transferred to the operating room via stretcher and placed on the operating table in supine position.  A well-padded calf tourniquet was applied.  The  right lower extremities and scrubbed, prepped, draped in the normal sterile fashion.  After appropriate timeout was performed the right lower extremity was exsanguinated with an Esmarch bandage and the pneumatic calf tourniquet was inflated to 250 mmHg.  At this time utilizing fluoroscopy house able to identify the fracture with metatarsal.  Linear incision was made on the second metatarsal base approximately 4 cm in length with a #15 blade scalpel.  This was made to the epidermis and dermis.  Subcutaneous tissues were bluntly and sharply dissected making sure to retract all vital neurovascular structures and all bleeders were cauterized and ligated as necessary.  Dissection was then carried out in a layered fashion to avoid any tenderness and neurovascular structures and a deep incision was made along the second metatarsal on the fracture site.  Soft tissue structures were freed from the metatarsal to easily identify the fracture and again this was confirmed under fluoroscopy.  Once the fracture was identified I utilized an osteotome and curettes in order to resect the nonunion.  Once I was adequately able to resect the nonunion down to healthy bone I did use 0.045 K wire length to help facilitate healing.  At this time I did she is a Acumed 5 hole plate for compression.  The plate was then temporarily fixated and fluoroscopy was utilized to confirm placement.  It appeared to be distal to the joint.  The screws were then inserted under standard technique to ensure compression.  Fluoroscopy was utilized confirm placement of the hardware and found to be adequate compression across the fracture site.  There is very  minimal gapping present.  Because of this I did not proceed with bone grafting to the calcaneus.  I did use DBX bone putty with cancellous chips to fill a small void.  At this time the fracture site appear to be stable and adequately compressed.  Incision was copiously irrigated with saline and hemostasis  achieved.  Incision was then closed in a layered fashion the deep structures closed with 2-0 Monocryl followed by the  subcutaneous tissues with 3-0 Monocryl and skin were then closed with Prolene.  A nonstick dressing followed by dry sterile dressing was applied to the surgical site.  Tourniquet was released and there was found to be in immediate capillary fill time to the digits.  She was transferred to the PACU vital signs stable and vascular status intact.  She tolerated the procedure well and complications.  [name], [name]

## 2019-07-15 ENCOUNTER — Other Ambulatory Visit: Payer: Self-pay | Admitting: Podiatry

## 2019-07-15 ENCOUNTER — Encounter: Payer: Self-pay | Admitting: Podiatry

## 2019-07-15 ENCOUNTER — Other Ambulatory Visit: Payer: Self-pay | Admitting: Family Medicine

## 2019-07-15 MED ORDER — OXYCODONE-ACETAMINOPHEN 5-325 MG PO TABS: 1.0000 | ORAL_TABLET | Freq: Four times a day (QID) | ORAL | 0 refills | Status: DC | PRN

## 2019-07-18 ENCOUNTER — Ambulatory Visit: Payer: Medicaid Other | Admitting: Podiatry

## 2019-07-18 ENCOUNTER — Other Ambulatory Visit: Payer: Self-pay

## 2019-07-18 DIAGNOSIS — E559 Vitamin D deficiency, unspecified: Secondary | ICD-10-CM

## 2019-07-18 DIAGNOSIS — S92901D Unspecified fracture of right foot, subsequent encounter for fracture with routine healing: Secondary | ICD-10-CM

## 2019-07-24 ENCOUNTER — Encounter: Payer: Self-pay | Admitting: Podiatry

## 2019-07-24 ENCOUNTER — Other Ambulatory Visit: Payer: Self-pay | Admitting: Podiatry

## 2019-07-24 MED ORDER — OXYCODONE-ACETAMINOPHEN 5-325 MG PO TABS: 1.0000 | ORAL_TABLET | Freq: Four times a day (QID) | ORAL | 0 refills | Status: DC | PRN

## 2019-07-25 ENCOUNTER — Other Ambulatory Visit: Payer: Self-pay

## 2019-07-25 ENCOUNTER — Ambulatory Visit (INDEPENDENT_AMBULATORY_CARE_PROVIDER_SITE_OTHER): Payer: Self-pay | Admitting: Podiatry

## 2019-07-25 DIAGNOSIS — S92901D Unspecified fracture of right foot, subsequent encounter for fracture with routine healing: Secondary | ICD-10-CM

## 2019-07-26 NOTE — Progress Notes (Signed)
Subjective: [name] is a 48 y.o. is seen today in office s/p right foot second metatarsal ORIF preformed on 07/03/2019.  She presented for suture removal.  She said that she is doing well.  Still in some discomfort I refill the pain medication yesterday.  She has been nonweightbearing wearing the cam boot.  She denies any fevers, chills, nausea, vomiting.  No calf pain right chest pain, shortness of breath.   Objective: General: No acute distress, AAOx3  DP/PT pulses palpable 2/4, CRT < 3 sec to all digits.  Protective sensation intact. Motor function intact.  RIGHT foot: Incision is well coapted without any evidence of dehiscence and sutures are intact.  No surrounding erythema, ascending cellulitis.  No drainage or pus or any clinical signs of infection noted today.  There is decreased edema to the foot.  Still some tenderness on the surgical site but overall improved. No pain with calf compression, swelling, warmth, erythema.   Assessment and Plan:  Status post right second metatarsal ORIF, doing well with no complications   -Treatment options discussed including all alternatives, risks, and complications -Sutures removed today without complications.  Incision is made well coapted.  She can start to shower tomorrow as long as incision is doing well.  Continue cam boot, nonweightbearing, ice and elevation. -Monitor for any clinical signs or symptoms of infection and DVT/PE and directed to call the office immediately should any occur or go to the ER. -Follow-up as scheduled or sooner if any problems arise. In the meantime, encouraged to call the office with any questions, concerns, change in symptoms.   X-ray next appointment  [name], [name]

## 2019-07-29 NOTE — Progress Notes (Signed)
Subjective: [name] is a 48 y.o. is seen today in office s/p right foot second metatarsal ORIF preformed on 07/03/2019.  She says she is doing better and her pain is improved.  She still taking the pain medicine however.  She been nonweightbearing in the cam boot.  She is eager to get out of the cam boot. Denies any systemic complaints such as fevers, chills, nausea, vomiting. No calf pain, chest pain, shortness of breath.   Objective: General: No acute distress, AAOx3  DP/PT pulses palpable 2/4, CRT < 3 sec to all digits.  Protective sensation intact. Motor function intact.  RIGHT foot: Incision is well coapted without any evidence of dehiscence and sutures are intact.  There is still some motion across the incision.  There is no surrounding erythema, ascending cellulitis, fluctuance, crepitus, malodor, drainage/purulence. There is mild however improved edema around the surgical site. There is mild pain along the surgical site.  No other areas of tenderness to bilateral lower extremities.  No other open lesions or pre-ulcerative lesions.  No pain with calf compression, swelling, warmth, erythema.   Assessment and Plan:  Status post right second metatarsal ORIF, doing well with no complications   -Treatment options discussed including all alternatives, risks, and complications -Left the sutures intact today.  Antibiotic ointment and dressings applied.  Keep dressing clean, dry, intact. -Remain nonweightbearing -Wear cam boot at all times even while sleeping. -Ice/elevation -Pain medication as needed. -Recheck Vitamin D level -Monitor for any clinical signs or symptoms of infection and DVT/PE and directed to call the office immediately should any occur or go to the ER. -Follow-up as scheduled for possible suture removal or sooner if any problems arise. In the meantime, encouraged to call the office with any questions, concerns, change in symptoms.   [name], [name]

## 2019-07-31 ENCOUNTER — Other Ambulatory Visit: Payer: Self-pay | Admitting: Podiatry

## 2019-07-31 ENCOUNTER — Encounter: Payer: Self-pay | Admitting: Podiatry

## 2019-07-31 MED ORDER — HYDROCODONE-ACETAMINOPHEN 10-325 MG PO TABS: 1.0000 | ORAL_TABLET | Freq: Four times a day (QID) | ORAL | 0 refills | Status: AC | PRN

## 2019-08-06 ENCOUNTER — Encounter: Payer: Self-pay | Admitting: Podiatry

## 2019-08-06 ENCOUNTER — Other Ambulatory Visit: Payer: Self-pay | Admitting: Podiatry

## 2019-08-06 MED ORDER — HYDROCODONE-ACETAMINOPHEN 10-325 MG PO TABS: 1.0000 | ORAL_TABLET | Freq: Four times a day (QID) | ORAL | 0 refills | Status: AC | PRN

## 2019-08-09 ENCOUNTER — Encounter: Payer: Self-pay | Admitting: Podiatry

## 2019-08-12 ENCOUNTER — Ambulatory Visit (INDEPENDENT_AMBULATORY_CARE_PROVIDER_SITE_OTHER): Payer: Medicaid Other | Admitting: Podiatry

## 2019-08-12 ENCOUNTER — Ambulatory Visit (INDEPENDENT_AMBULATORY_CARE_PROVIDER_SITE_OTHER): Payer: Medicaid Other

## 2019-08-12 ENCOUNTER — Other Ambulatory Visit: Payer: Self-pay

## 2019-08-12 DIAGNOSIS — M79671 Pain in right foot: Secondary | ICD-10-CM

## 2019-08-12 DIAGNOSIS — E559 Vitamin D deficiency, unspecified: Secondary | ICD-10-CM

## 2019-08-12 DIAGNOSIS — S92901D Unspecified fracture of right foot, subsequent encounter for fracture with routine healing: Secondary | ICD-10-CM

## 2019-08-12 MED ORDER — CEPHALEXIN 500 MG PO CAPS: 500.0000 mg | ORAL_CAPSULE | Freq: Three times a day (TID) | ORAL | 0 refills | Status: DC

## 2019-08-12 MED ORDER — HYDROCODONE-ACETAMINOPHEN 10-325 MG PO TABS: 1.0000 | ORAL_TABLET | Freq: Four times a day (QID) | ORAL | 0 refills | Status: DC | PRN

## 2019-08-13 ENCOUNTER — Ambulatory Visit: Payer: Medicaid Other

## 2019-08-16 ENCOUNTER — Other Ambulatory Visit: Payer: Self-pay | Admitting: Podiatry

## 2019-08-16 MED ORDER — HYDROCODONE-ACETAMINOPHEN 10-325 MG PO TABS: 1.0000 | ORAL_TABLET | Freq: Four times a day (QID) | ORAL | 0 refills | Status: AC | PRN

## 2019-08-19 NOTE — Progress Notes (Signed)
Subjective: [name] is a 48 y.o. is seen today in office s/p right foot second metatarsal ORIF preformed on 07/03/2019.  She states that she is doing well but still having discomfort at times.  She still in a cam boot and using crutches but she does not know she has put weight on her foot some.  She denies any fevers, chills, nausea, vomiting.  No calf pain right chest pain, shortness of breath.   Objective: General: No acute distress, AAOx3  DP/PT pulses palpable 2/4, CRT < 3 sec to all digits.  Protective sensation intact. Motor function intact.  RIGHT foot: Incision is well coapted without any evidence of dehiscence and incision appears to be healing well and scar is forming.  No surrounding erythema, ascending cellulitis.  No drainage or pus or any clinical signs of infection noted today.  There is mild edema to the foot.  Still some tenderness on the surgical site but overall improved. No pain with calf compression, swelling, warmth, erythema.   Assessment and Plan:  Status post right second metatarsal ORIF, doing well with no complications   -Treatment options discussed including all alternatives, risks, and complications -X-rays obtained reviewed.  Hardware intact.  Radiolucent line still evident of the fracture however there is some consolidation present. -Incision appears to be healing well.  Continue antibiotic ointment dressing changes daily.  Continue with ice elevation, nonweightbearing wear cam boot at all times.  She never had her vitamin D level rechecked that she is going to get this done. -Monitor for any clinical signs or symptoms of infection and DVT/PE and directed to call the office immediately should any occur or go to the ER. -Follow-up as scheduled or sooner if any problems arise. In the meantime, encouraged to call the office with any questions, concerns, change in symptoms.   X-ray next appointment  [name], [name]

## 2019-08-22 ENCOUNTER — Telehealth: Payer: Self-pay | Admitting: Podiatry

## 2019-08-22 ENCOUNTER — Other Ambulatory Visit: Payer: Self-pay | Admitting: Podiatry

## 2019-08-22 MED ORDER — HYDROCODONE-ACETAMINOPHEN 10-325 MG PO TABS: 1.0000 | ORAL_TABLET | Freq: Four times a day (QID) | ORAL | 0 refills | Status: AC | PRN

## 2019-08-22 NOTE — Telephone Encounter (Signed)
done

## 2019-08-22 NOTE — Telephone Encounter (Signed)
Pt wants to know if she can get her pain med refill [facility] pt number [phone]

## 2019-08-27 ENCOUNTER — Other Ambulatory Visit: Payer: Self-pay

## 2019-08-27 ENCOUNTER — Ambulatory Visit (INDEPENDENT_AMBULATORY_CARE_PROVIDER_SITE_OTHER): Payer: Medicaid Other

## 2019-08-27 ENCOUNTER — Ambulatory Visit (INDEPENDENT_AMBULATORY_CARE_PROVIDER_SITE_OTHER): Payer: Self-pay | Admitting: Podiatry

## 2019-08-27 ENCOUNTER — Encounter: Payer: Self-pay | Admitting: Family Medicine

## 2019-08-27 DIAGNOSIS — S92901D Unspecified fracture of right foot, subsequent encounter for fracture with routine healing: Secondary | ICD-10-CM

## 2019-08-27 DIAGNOSIS — E559 Vitamin D deficiency, unspecified: Secondary | ICD-10-CM

## 2019-08-27 MED ORDER — HYDROCODONE-ACETAMINOPHEN 5-325 MG PO TABS: 1.0000 | ORAL_TABLET | Freq: Three times a day (TID) | ORAL | 0 refills | Status: DC | PRN

## 2019-08-27 NOTE — Progress Notes (Signed)
Subjective: [name] is a 48 y.o. is seen today in office s/p right foot second metatarsal ORIF preformed on 07/03/2019.  She said that she is feeling better and the pain is improving.  The swelling is improved.  She gets some occasional numbness or tingling to the incision site but otherwise this is not consistent.  No radiating pain or weakness.  She does relate that she did have another fall but she was wearing the cam boot.  No other injury. She denies any fevers, chills, nausea, vomiting.  No calf pain right chest pain, shortness of breath.   Objective: General: No acute distress, AAOx3  DP/PT pulses palpable 2/4, CRT < 3 sec to all digits.  Protective sensation intact. Motor function intact.  RIGHT foot: Incision is well coapted without any evidence of dehiscence and incision appears to be healing well.  Eschar is well formed.  There is decreased edema.  Minimal tenderness palpation surgical site.  Range of motion intact of the digits.  There is no erythema or warmth or any clinical signs of infection. No pain with calf compression, swelling, warmth, erythema.   Assessment and Plan:  Status post right second metatarsal ORIF, doing well with no complications   -Treatment options discussed including all alternatives, risks, and complications -X-rays obtained reviewed.  Hardware intact.  Radiolucent line still evident of the fracture however there is some increased consolidation present. -I want her to start using a bone stimulator.  She does have this at home. -Remain in cam boot.   -Ice elevation. -Decrease pain medicine to Vicodin 5/325. -She did not get the vitamin D rechecked and she is going to do this later this week.  Return in about 3 weeks (around 09/17/2019).  Trula Slade [name]

## 2019-09-02 ENCOUNTER — Encounter: Payer: Self-pay | Admitting: Podiatry

## 2019-09-03 ENCOUNTER — Encounter: Payer: Self-pay | Admitting: Neurology

## 2019-09-03 ENCOUNTER — Other Ambulatory Visit: Payer: Self-pay | Admitting: Podiatry

## 2019-09-03 MED ORDER — HYDROCODONE-ACETAMINOPHEN 5-325 MG PO TABS: 1.0000 | ORAL_TABLET | Freq: Three times a day (TID) | ORAL | 0 refills | Status: DC | PRN

## 2019-09-08 ENCOUNTER — Other Ambulatory Visit: Payer: Self-pay | Admitting: Family Medicine

## 2019-09-09 ENCOUNTER — Other Ambulatory Visit: Payer: Self-pay | Admitting: Podiatry

## 2019-09-10 MED ORDER — HYDROCODONE-ACETAMINOPHEN 5-325 MG PO TABS: 1.0000 | ORAL_TABLET | Freq: Three times a day (TID) | ORAL | 0 refills | Status: DC | PRN

## 2019-09-17 ENCOUNTER — Encounter: Payer: Medicaid Other | Admitting: Podiatry

## 2019-09-20 ENCOUNTER — Other Ambulatory Visit: Payer: Self-pay | Admitting: Podiatry

## 2019-09-20 ENCOUNTER — Telehealth: Payer: Self-pay | Admitting: *Deleted

## 2019-09-20 MED ORDER — HYDROCODONE-ACETAMINOPHEN 5-325 MG PO TABS: 1.0000 | ORAL_TABLET | Freq: Three times a day (TID) | ORAL | 0 refills | Status: DC | PRN

## 2019-09-20 NOTE — Telephone Encounter (Signed)
-----   Message from Trula Slade, [name] sent at 09/20/2019  3:14 PM EST ----- done ----- Message ----- From: Viviana Simpler, [place] Sent: 09/20/2019   2:32 PM EST To: Trula Slade, [name]  Hey Dr Lawernce Pitts called and left a message stating that she missed her appointment and did not have a ride and would like a refill of the pain medicine. Please Advise Maleiyah

## 2019-09-20 NOTE — Telephone Encounter (Signed)
Called and left a message for the patient that Dr Jacqualyn Posey sent over the medicine to the pharmacy and if any concerns or questions to call the office. Lattie Haw

## 2019-09-25 ENCOUNTER — Other Ambulatory Visit: Payer: Self-pay | Admitting: Podiatry

## 2019-09-26 ENCOUNTER — Other Ambulatory Visit: Payer: Self-pay | Admitting: Podiatry

## 2019-09-26 ENCOUNTER — Encounter: Payer: Self-pay | Admitting: Podiatry

## 2019-09-26 MED ORDER — HYDROCODONE-ACETAMINOPHEN 5-325 MG PO TABS: 1.0000 | ORAL_TABLET | Freq: Three times a day (TID) | ORAL | 0 refills | Status: DC | PRN

## 2019-09-26 NOTE — Progress Notes (Signed)
error 

## 2019-09-27 ENCOUNTER — Ambulatory Visit: Payer: Medicaid Other | Admitting: Podiatry

## 2019-10-02 ENCOUNTER — Other Ambulatory Visit: Payer: Self-pay | Admitting: Podiatry

## 2019-10-04 ENCOUNTER — Ambulatory Visit (INDEPENDENT_AMBULATORY_CARE_PROVIDER_SITE_OTHER): Payer: Medicaid Other

## 2019-10-04 ENCOUNTER — Telehealth (INDEPENDENT_AMBULATORY_CARE_PROVIDER_SITE_OTHER): Payer: Medicaid Other | Admitting: Neurology

## 2019-10-04 ENCOUNTER — Telehealth: Payer: Self-pay | Admitting: Neurology

## 2019-10-04 ENCOUNTER — Encounter: Payer: Self-pay | Admitting: Neurology

## 2019-10-04 ENCOUNTER — Other Ambulatory Visit: Payer: Self-pay

## 2019-10-04 ENCOUNTER — Encounter: Payer: Self-pay | Admitting: Podiatry

## 2019-10-04 ENCOUNTER — Ambulatory Visit (INDEPENDENT_AMBULATORY_CARE_PROVIDER_SITE_OTHER): Payer: Medicaid Other | Admitting: Podiatry

## 2019-10-04 VITALS — Ht 65.0 in | Wt 140.0 lb

## 2019-10-04 VITALS — Temp 97.6°F

## 2019-10-04 DIAGNOSIS — S92811D Other fracture of right foot, subsequent encounter for fracture with routine healing: Secondary | ICD-10-CM

## 2019-10-04 DIAGNOSIS — E5111 Dry beriberi: Secondary | ICD-10-CM

## 2019-10-04 DIAGNOSIS — G6289 Other specified polyneuropathies: Secondary | ICD-10-CM | POA: Diagnosis not present

## 2019-10-04 DIAGNOSIS — E538 Deficiency of other specified B group vitamins: Secondary | ICD-10-CM

## 2019-10-04 DIAGNOSIS — S92901D Unspecified fracture of right foot, subsequent encounter for fracture with routine healing: Secondary | ICD-10-CM

## 2019-10-04 MED ORDER — PREGABALIN 100 MG PO CAPS: ORAL_CAPSULE | ORAL | 5 refills | Status: DC

## 2019-10-04 MED ORDER — HYDROCODONE-ACETAMINOPHEN 5-325 MG PO TABS: 1.0000 | ORAL_TABLET | Freq: Three times a day (TID) | ORAL | 0 refills | Status: DC | PRN

## 2019-10-04 NOTE — Telephone Encounter (Signed)
Patient wants to know if Dr. Posey Pronto will write prescriptions for: 0000000, B-1, and Folic Acid. She also wants to confirm the correct dosage of folic acid she should be taking.

## 2019-10-04 NOTE — Progress Notes (Signed)
   Virtual Visit via Video Note The purpose of this virtual visit is to provide medical care while limiting exposure to the novel coronavirus.    Consent was obtained for video visit:  Yes.   Answered questions that patient had about telehealth interaction:  Yes.   I discussed the limitations, risks, security and privacy concerns of performing an evaluation and management service by telemedicine. I also discussed with the patient that there may be a patient responsible charge related to this service. The patient expressed understanding and agreed to proceed.  Pt location: Home Physician Location: office Name of referring provider:  Charlott Rakes, MD I connected with [name] at patients initiation/request on 10/04/2019 at  3:30 PM EST by video enabled telemedicine application and verified that I am speaking with the correct person using two identifiers. Pt MRN:  [id number] Pt DOB:  13-Feb-1971 Video Participants:  [name]   History of Present Illness: This is a 49 y.o. 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  Overall, there has been no significant change in the numbness in the legs.  She has been compliant with her supplements. Neuropathy pain is well controlled on Lyrica 100mg  TID. She fractured her right foot in August after loosing her balance and underwent surgery in October.  Since this time, she has increased pain in the right foot, which is worse in the morning.  Pain is alleviated with hydrocodone which is prescribed by her podiatrist. She walks with a cam boot.   She continues to consume alcohol on the weekends and has 2-3 drinks in a sitting.  She is smoking half-pack per day.   Observations/Objective:   Vitals:   10/04/19 1530  Weight: 140 lb (63.5 kg)  Height: 5\' 5"  (1.651 m)   Patient is awake, alert, and appears comfortable.   Extraocular muscles are intact. No ptosis.  Face is symmetric.  Speech is not  dysarthric.  Antigravity in all extremities.    DATA: Labs 10/01/2018:  Vitamin B12 352, vitamin B1 8, folate 6.4  Assessment and Plan:  1.  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  Symptoms are stable with ongoing numbness and pain in the lower legs.  Pain related to neuropathy is well-controlled on Lyrica 100mg  TID - refills provided She also takes Cymbalta 60mg  BID which is written by her PCP for depression and pain.  2.  Nutritional deficiencies.   Continue vitmain B12 1045mcg daily, vitamin B1 100mg  daily and folic acid 1mg  daily If she completely abstains from alcohol in the future, we may be able to discontinue supplements, but continue for now  3.  Right foot fracture and pain, followed by podiatry.    Follow Up Instructions:   I discussed the assessment and treatment plan with the patient. The patient was provided an opportunity to ask questions and all were answered. The patient agreed with the plan and demonstrated an understanding of the instructions.   The patient was advised to call back or seek an in-person evaluation if the symptoms worsen or if the condition fails to improve as anticipated.  Follow-up in 1 year   Alda Berthold, DO

## 2019-10-07 NOTE — Telephone Encounter (Signed)
These are over the counter medications and do not need prescribed.  She needs to take: Vitamin B12 1035mcg daily Vitamin B1 100mg  daily Folic acid 1mg  daily

## 2019-10-07 NOTE — Telephone Encounter (Signed)
Left message informing patient to pick medications up over the counter. Names, dosages and how to take left on vmail as well.

## 2019-10-07 NOTE — Telephone Encounter (Signed)
I see in your last OV note how you would like for pt to take the medications. Are you ok to send them as prescriptions?

## 2019-10-08 ENCOUNTER — Other Ambulatory Visit: Payer: Self-pay | Admitting: Family Medicine

## 2019-10-11 NOTE — Progress Notes (Signed)
Subjective: [name] is a 49 y.o. is seen today in office s/p right foot second metatarsal ORIF preformed on 07/03/2019.  Overall she states that she is doing well and her pain is improving.  She is asking for 1 more refill the Vicodin she states that hopefully after this we will need any more.  She has been walking in the cam boot.  No recent injury or falls that she reports.  She denies any fevers, chills, nausea, vomiting.  No calf pain right chest pain, shortness of breath.   Objective: General: No acute distress, AAOx3  DP/PT pulses palpable 2/4, CRT < 3 sec to all digits.  Protective sensation intact. Motor function intact.  RIGHT foot: Incision is well coapted without any evidence of dehiscence and incision appears to be healing well with eschar.  There is minimal edema.  No erythema or warmth.  Not able to elicit any significant discomfort today and only very minimal discomfort identified.  No pain to submetatarsal areas today.  Is no other areas of discomfort. No pain with calf compression, swelling, warmth, erythema.   Assessment and Plan:  Status post right second metatarsal ORIF, doing well with no complications   -Treatment options discussed including all alternatives, risks, and complications -X-rays obtained reviewed.  Hardware intact.  Radiolucent line still evident of the fracture however there is increased consolidation across the fracture site. -Remain in cam boot.  Continue bone stimulator.  She has been using this intermittently. -As she starts to feel better she can start to transition to regular shoe as able. -If pain medicine has been refilled discussed tramadol.  Return in about 4 weeks (around 11/01/2019).  Repeat x-rays  Trula Slade [name]

## 2019-10-17 ENCOUNTER — Other Ambulatory Visit: Payer: Self-pay | Admitting: Podiatry

## 2019-10-17 ENCOUNTER — Encounter: Payer: Self-pay | Admitting: Podiatry

## 2019-10-21 ENCOUNTER — Other Ambulatory Visit: Payer: Self-pay | Admitting: Podiatry

## 2019-10-21 MED ORDER — HYDROCODONE-ACETAMINOPHEN 5-325 MG PO TABS: 1.0000 | ORAL_TABLET | Freq: Three times a day (TID) | ORAL | 0 refills | Status: DC | PRN

## 2019-10-21 NOTE — Progress Notes (Signed)
Refilled pain medication. Was going to go down to tramadol but due to other medications I will refill vicodin.

## 2019-10-22 ENCOUNTER — Telehealth: Payer: Self-pay | Admitting: Family Medicine

## 2019-10-22 NOTE — Telephone Encounter (Signed)
Called patient at [phone] to reach , left VM to call back . Name and contact nr provided.

## 2019-10-22 NOTE — Telephone Encounter (Signed)
Patient called to report she is having a fever of 102 and would like a nurse to call her back if possible to discuss what to do until her appointment tomorrow 10/23/19. Please advise if possible

## 2019-10-23 ENCOUNTER — Encounter: Payer: Self-pay | Admitting: Family Medicine

## 2019-10-23 ENCOUNTER — Ambulatory Visit: Payer: Medicaid Other | Attending: Family Medicine | Admitting: Family Medicine

## 2019-10-23 ENCOUNTER — Other Ambulatory Visit: Payer: Self-pay

## 2019-10-23 DIAGNOSIS — Z20822 Contact with and (suspected) exposure to covid-19: Secondary | ICD-10-CM | POA: Diagnosis not present

## 2019-10-23 NOTE — Progress Notes (Signed)
Virtual Visit via Telephone Note  I connected with [name], on 10/23/2019 at 10:52 AM by telephone due to the COVID-19 pandemic and verified that I am speaking with the correct person using two identifiers.   Consent: I discussed the limitations, risks, security and privacy concerns of performing an evaluation and management service by telephone and the availability of in person appointments. I also discussed with the patient that there may be a patient responsible charge related to this service. The patient expressed understanding and agreed to proceed.   Location of Patient: Home  Location of Provider: Clinic   Persons participating in Telemedicine visit: Arlean Dighton [facility] Dr. 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  Has had fever of 100.2, headache, diarrhea; this all started about 36 hours ago.  Diarrhea has occurred about 6 times in the last 1 hour.  She denies presence of upper respiratory symptoms. She has no appetite and denies abnormal taste smell.  Unknown history of sick contacts. She has her boyfriend with her at the moment.   Past Medical History:  Diagnosis Date  . Arthritis    right foot  . GERD (gastroesophageal reflux disease)   . History of alcoholism ([place])   . History of cellulitis    2015  right side face  . Hypertension    followed by pcp  (per pt never had stress test)  . Malnutrition ([place])   . Metatarsal fracture    right 2nd  . Peripheral neuropathy    feet  . Strachan's syndrome    painful neuropathy  . Wears dentures    upper  . Wears glasses    Allergies  Allergen Reactions  . Macrobid [Nitrofurantoin Macrocrystal] Anaphylaxis and Other (See Comments)    Patient reports 2 syncope episodes after her  mouth became numb    Current Outpatient Medications on File Prior to Visit  Medication Sig Dispense Refill  . amLODipine (NORVASC) 5 MG tablet Take 1 tablet (5 mg total) by mouth daily. (Patient taking differently: Take 5 mg by mouth daily. ) 30 tablet 6  . DULoxetine (CYMBALTA) 60 MG capsule TAKE 1 CAPSULE BY MOUTH TWICE A DAY 60 capsule 6  . hydrALAZINE (APRESOLINE) 10 MG tablet Take 1 tablet (10 mg total) by mouth 3 (three) times daily. 90 tablet 6  . hydrOXYzine (ATARAX/VISTARIL) 25 MG tablet Take 1 tablet (25 mg total) by mouth 3 (three) times daily as needed. 60 tablet 3  . KLOR-CON M10 10 MEQ tablet TAKE 1 TABLET BY MOUTH EVERY DAY 30 tablet 6  . Multiple Vitamins-Minerals (MULTIVITAMIN WITH MINERALS) tablet Take 1 tablet by mouth daily.    Marland Kitchen omeprazole (PRILOSEC) 20 MG capsule TAKE 1 CAPSULE BY MOUTH EVERY DAY (Patient taking differently: Take 20 mg by mouth daily. TAKE 1 CAPSULE BY MOUTH EVERY DAY) 30 capsule 6  . pregabalin (LYRICA) 100 MG capsule TAKE 1 CAPSULE BY MOUTH THREE TIMES A DAY 90 capsule 5  . traZODone (DESYREL) 100 MG tablet Take 1 tablet (100 mg total) by mouth at bedtime as needed for sleep. 30 tablet 6  . Vitamin D, Ergocalciferol, (DRISDOL) 1.25 MG (50000 UT) CAPS capsule Take 1 capsule (50,000 Units total) by mouth every 7 (seven) days. Please make PCP appointment. 4 capsule 0  .  HYDROcodone-acetaminophen (NORCO/VICODIN) 5-325 MG tablet Take 1 tablet by mouth every 8 (eight) hours as needed. (Patient not taking: Reported on 10/23/2019) 10 tablet 0   No current facility-administered medications on file prior to visit.    Observations/Objective: Awake, alert Not in acute distress   Assessment and Plan: 1. Suspected COVID-19 virus infection Advised to text Covid to [phone] to schedule testing Counseled on commencing isolation Increase fluid intake, comments OTC vitamin C, vitamin D, zinc, magnesium Advised that if symptoms progress she will need an urgent care  visit - MyChart COVID-19 home monitoring program; Future - Temperature monitoring; Future   Follow Up Instructions: Return in about 1 week (around 10/30/2019) for Follow-up of suspected Covid.    I discussed the assessment and treatment plan with the patient. The patient was provided an opportunity to ask questions and all were answered. The patient agreed with the plan and demonstrated an understanding of the instructions.   The patient was advised to call back or seek an in-person evaluation if the symptoms worsen or if the condition fails to improve as anticipated.     I provided 15 minutes total of non-face-to-face time during this encounter including median intraservice time, reviewing previous notes, investigations, ordering medications, medical decision making, coordinating care and patient verbalized understanding at the end of the visit.     Charlott Rakes, MD, FAAFP. [facility], [place]   10/23/2019, 10:52 AM

## 2019-10-23 NOTE — Progress Notes (Signed)
Patient has been called and DOB has been verified. Patient has been screened and transferred to PCP to start phone visit.  Highest fever has been 100.2 no coughing, runny nose.  Abdominal pain and headaches.

## 2019-10-24 ENCOUNTER — Ambulatory Visit: Payer: Medicaid Other | Admitting: Podiatry

## 2019-10-24 NOTE — Telephone Encounter (Signed)
Pt concerns were addressed by MD Margarita Rana / refer to MD notes

## 2019-10-25 ENCOUNTER — Encounter: Payer: Self-pay | Admitting: Family Medicine

## 2019-10-29 ENCOUNTER — Encounter: Payer: Medicaid Other | Admitting: Podiatry

## 2019-10-31 ENCOUNTER — Ambulatory Visit: Payer: Medicaid Other | Admitting: Podiatry

## 2019-11-01 ENCOUNTER — Encounter: Payer: Medicaid Other | Admitting: Podiatry

## 2019-11-06 ENCOUNTER — Other Ambulatory Visit: Payer: Self-pay | Admitting: Podiatry

## 2019-11-06 ENCOUNTER — Encounter: Payer: Self-pay | Admitting: Podiatry

## 2019-11-07 ENCOUNTER — Other Ambulatory Visit: Payer: Self-pay | Admitting: Podiatry

## 2019-11-07 MED ORDER — HYDROCODONE-ACETAMINOPHEN 5-325 MG PO TABS: 1.0000 | ORAL_TABLET | Freq: Three times a day (TID) | ORAL | 0 refills | Status: DC | PRN

## 2019-11-07 NOTE — Telephone Encounter (Signed)
I sent this medication this morning.

## 2019-11-12 ENCOUNTER — Ambulatory Visit (INDEPENDENT_AMBULATORY_CARE_PROVIDER_SITE_OTHER): Payer: Medicaid Other

## 2019-11-12 ENCOUNTER — Ambulatory Visit (INDEPENDENT_AMBULATORY_CARE_PROVIDER_SITE_OTHER): Payer: Medicaid Other | Admitting: Podiatry

## 2019-11-12 ENCOUNTER — Encounter: Payer: Self-pay | Admitting: Podiatry

## 2019-11-12 ENCOUNTER — Other Ambulatory Visit: Payer: Self-pay

## 2019-11-12 VITALS — Temp 97.1°F | Resp 16

## 2019-11-12 DIAGNOSIS — E559 Vitamin D deficiency, unspecified: Secondary | ICD-10-CM

## 2019-11-12 DIAGNOSIS — S92811D Other fracture of right foot, subsequent encounter for fracture with routine healing: Secondary | ICD-10-CM | POA: Diagnosis not present

## 2019-11-12 DIAGNOSIS — S92901D Unspecified fracture of right foot, subsequent encounter for fracture with routine healing: Secondary | ICD-10-CM | POA: Diagnosis not present

## 2019-11-12 MED ORDER — HYDROCODONE-ACETAMINOPHEN 5-325 MG PO TABS: 1.0000 | ORAL_TABLET | Freq: Three times a day (TID) | ORAL | 0 refills | Status: DC | PRN

## 2019-11-15 NOTE — Progress Notes (Signed)
Subjective: [name] is a 49 y.o. is seen today in office s/p right foot second metatarsal ORIF preformed on 07/03/2019.  She states that she still gets some discomfort with walking.  She does walk to the store which is uphill and she has been walking more in a regular shoe when she gets some discomfort after doing this.  This is when she takes pain medicine but she is trying to take it more intermittently.  No recent injury or falls.  Some occasional swelling.  She did not get the vitamin D level rechecked yet.  She still using a bone stimulator. She denies any fevers, chills, nausea, vomiting.  No calf pain right chest pain, shortness of breath.   Objective: General: No acute distress, AAOx3  DP/PT pulses palpable 2/4, CRT < 3 sec to all digits.  Protective sensation intact. Motor function intact.  RIGHT foot: Incision is well coapted without any evidence of dehiscence and the scar is well formed.  Minimal edema.  There is no erythema or warmth.  Minimal tenderness to palpation today.  No other areas of discomfort.  Majority of tenderness is actually more along the distal portion of metatarsal towards the end of the hardware but not able to palpate any hardware.  No pain with calf compression, swelling, warmth, erythema.   Assessment and Plan:  Status post right second metatarsal ORIF, doing well with no complications   -Treatment options discussed including all alternatives, risks, and complications -X-rays obtained reviewed.  Hardware intact.  On the AP view there is still radiolucent line with fracture however not able to well visualize this on the oblique of the lateral.  There is increased consolidation.  -Remain in regular shoe and she can use the graphite insert.  Continue bone stimulator.  The medication as needed but will try to wean off of this.  Continue to ice elevate as well.  We will recheck vitamin D level.  Trula Slade [name]

## 2019-11-20 ENCOUNTER — Other Ambulatory Visit: Payer: Self-pay | Admitting: Family Medicine

## 2019-11-21 ENCOUNTER — Other Ambulatory Visit: Payer: Self-pay | Admitting: Podiatry

## 2019-11-26 ENCOUNTER — Encounter: Payer: Self-pay | Admitting: Podiatry

## 2019-11-26 MED ORDER — HYDROCODONE-ACETAMINOPHEN 5-325 MG PO TABS: 1.0000 | ORAL_TABLET | Freq: Three times a day (TID) | ORAL | 0 refills | Status: DC | PRN

## 2019-11-27 ENCOUNTER — Telehealth: Payer: Self-pay | Admitting: *Deleted

## 2019-11-27 NOTE — Telephone Encounter (Signed)
-----   Message from Trula Slade, [name] sent at 11/26/2019  7:21 PM EDT ----- I told her we would put a compression anklet at the front desk for her to come pick up. She should be there Wednesday if someone could take care of this since I will not be in the office Wednesday. Thank you.

## 2019-11-27 NOTE — Telephone Encounter (Signed)
I have an anklet at the front desk. Lattie Haw

## 2019-12-17 DIAGNOSIS — E559 Vitamin D deficiency, unspecified: Secondary | ICD-10-CM | POA: Diagnosis not present

## 2019-12-17 LAB — VITAMIN D 25 HYDROXY (VIT D DEFICIENCY, FRACTURES): Vit D, 25-Hydroxy: 51 ng/mL (ref 30–100)

## 2019-12-18 ENCOUNTER — Telehealth: Payer: Self-pay | Admitting: *Deleted

## 2019-12-18 ENCOUNTER — Other Ambulatory Visit: Payer: Self-pay | Admitting: Podiatry

## 2019-12-18 NOTE — Progress Notes (Signed)
error 

## 2019-12-18 NOTE — Telephone Encounter (Signed)
Called and left a message stating that the Vitamin D was within normal range and per Dr Jacqualyn Posey would hold off on further supplements and stated to call the office with any concerns or questions. Lattie Haw

## 2019-12-18 NOTE — Telephone Encounter (Signed)
-----   Message from Trula Slade, [name] sent at 12/18/2019 11:26 AM EDT ----- Lattie Haw- will you let her know the vitamin D is in a normal range now. We can hold any further supplements. Thanks.

## 2019-12-24 ENCOUNTER — Ambulatory Visit (INDEPENDENT_AMBULATORY_CARE_PROVIDER_SITE_OTHER): Payer: Medicaid Other | Admitting: Podiatry

## 2019-12-24 ENCOUNTER — Other Ambulatory Visit: Payer: Self-pay

## 2019-12-24 DIAGNOSIS — S92324K Nondisplaced fracture of second metatarsal bone, right foot, subsequent encounter for fracture with nonunion: Secondary | ICD-10-CM | POA: Diagnosis not present

## 2019-12-24 MED ORDER — HYDROCODONE-ACETAMINOPHEN 5-325 MG PO TABS: 1.0000 | ORAL_TABLET | Freq: Three times a day (TID) | ORAL | 0 refills | Status: DC | PRN

## 2019-12-25 NOTE — Progress Notes (Signed)
Subjective: [name] is a 49 y.o. is seen today in office s/p right foot second metatarsal ORIF preformed on 07/03/2019.  Overall she states that she is doing better.  She has been walking more she walks about 1-1/2 to 2 miles per day.  She takes pain medicine only in the mornings often for refill.  Overall she has been doing much better the pain is improved as well as swelling.  Denies any fevers, chills, nausea, vomiting.  No calf pain, chest pain or shortness of breath.  No recent injury or changes otherwise.   Objective: General: No acute distress, AAOx3  DP/PT pulses palpable 2/4, CRT < 3 sec to all digits.  Protective sensation intact. Motor function intact.  RIGHT foot: Incision is well coapted without any evidence of dehiscence and the scar is well formed.  Trace edema.  There is no tenderness palpation.  Flexor, extensor tendons appear to be intact.  No other areas of discomfort identified today.  No pain with calf compression, swelling, warmth, erythema.   Assessment and Plan:  Status post right second metatarsal ORIF, doing well with no complications   -Treatment options discussed including all alternatives, risks, and complications -We will hold on x-rays as he is doing well today.  I did give her 1 more prescription of pain medicine hopefully this with the last one.  Continue with wearing supportive shoes and gradually increase her walking.    Trula Slade [name]

## 2020-01-11 ENCOUNTER — Other Ambulatory Visit: Payer: Self-pay | Admitting: Family Medicine

## 2020-01-11 DIAGNOSIS — K219 Gastro-esophageal reflux disease without esophagitis: Secondary | ICD-10-CM

## 2020-01-12 ENCOUNTER — Other Ambulatory Visit: Payer: Self-pay | Admitting: Family Medicine

## 2020-01-12 DIAGNOSIS — G6289 Other specified polyneuropathies: Secondary | ICD-10-CM

## 2020-01-24 ENCOUNTER — Other Ambulatory Visit: Payer: Self-pay | Admitting: Family Medicine

## 2020-01-24 DIAGNOSIS — I10 Essential (primary) hypertension: Secondary | ICD-10-CM

## 2020-01-28 ENCOUNTER — Telehealth: Payer: Self-pay | Admitting: Neurology

## 2020-01-28 MED ORDER — PREGABALIN 150 MG PO CAPS
ORAL_CAPSULE | ORAL | 5 refills | Status: DC
Start: 2020-01-28 — End: 2020-08-19

## 2020-01-28 NOTE — Telephone Encounter (Signed)
Rx sent for Lyrica 150mg  TID

## 2020-01-28 NOTE — Telephone Encounter (Signed)
There is room to increase Lyrica dose to 150mg  three times a day, if she would like to start with that first.

## 2020-01-28 NOTE — Telephone Encounter (Signed)
Patient broke her foot. She has neuropathy in that foot. She states the Lyrica is not working for her anymore, she would like to know if there is something else she can try. She said the pain is almost unbearable and makes it very difficult to stand. She has tried Gabipentin in the past before trying Lyrica. Please call.

## 2020-01-28 NOTE — Telephone Encounter (Signed)
Pt advised. Please send in new rx change.

## 2020-01-28 NOTE — Telephone Encounter (Signed)
Please advise 

## 2020-03-16 ENCOUNTER — Other Ambulatory Visit: Payer: Self-pay | Admitting: Family Medicine

## 2020-03-16 DIAGNOSIS — K219 Gastro-esophageal reflux disease without esophagitis: Secondary | ICD-10-CM

## 2020-04-01 ENCOUNTER — Other Ambulatory Visit: Payer: Self-pay

## 2020-04-01 ENCOUNTER — Ambulatory Visit (HOSPITAL_COMMUNITY)
Admission: EM | Admit: 2020-04-01 | Discharge: 2020-04-01 | Disposition: A | Discharge status: Home / Self Care | Payer: Medicaid Other | Attending: Family Medicine | Admitting: Family Medicine

## 2020-04-01 ENCOUNTER — Encounter (HOSPITAL_COMMUNITY): Payer: Self-pay | Admitting: Emergency Medicine

## 2020-04-01 DIAGNOSIS — G629 Polyneuropathy, unspecified: Secondary | ICD-10-CM | POA: Insufficient documentation

## 2020-04-01 DIAGNOSIS — K219 Gastro-esophageal reflux disease without esophagitis: Secondary | ICD-10-CM | POA: Insufficient documentation

## 2020-04-01 DIAGNOSIS — I10 Essential (primary) hypertension: Secondary | ICD-10-CM | POA: Insufficient documentation

## 2020-04-01 DIAGNOSIS — F17210 Nicotine dependence, cigarettes, uncomplicated: Secondary | ICD-10-CM | POA: Insufficient documentation

## 2020-04-01 DIAGNOSIS — Z79899 Other long term (current) drug therapy: Secondary | ICD-10-CM | POA: Insufficient documentation

## 2020-04-01 DIAGNOSIS — M19071 Primary osteoarthritis, right ankle and foot: Secondary | ICD-10-CM | POA: Diagnosis not present

## 2020-04-01 DIAGNOSIS — Z7901 Long term (current) use of anticoagulants: Secondary | ICD-10-CM | POA: Insufficient documentation

## 2020-04-01 DIAGNOSIS — R0789 Other chest pain: Secondary | ICD-10-CM | POA: Diagnosis not present

## 2020-04-01 DIAGNOSIS — E559 Vitamin D deficiency, unspecified: Secondary | ICD-10-CM | POA: Diagnosis not present

## 2020-04-01 DIAGNOSIS — Z20822 Contact with and (suspected) exposure to covid-19: Secondary | ICD-10-CM | POA: Diagnosis not present

## 2020-04-01 DIAGNOSIS — G4700 Insomnia, unspecified: Secondary | ICD-10-CM | POA: Diagnosis not present

## 2020-04-01 DIAGNOSIS — R197 Diarrhea, unspecified: Secondary | ICD-10-CM | POA: Insufficient documentation

## 2020-04-01 DIAGNOSIS — R05 Cough: Secondary | ICD-10-CM | POA: Diagnosis present

## 2020-04-01 DIAGNOSIS — J069 Acute upper respiratory infection, unspecified: Secondary | ICD-10-CM

## 2020-04-01 MED ORDER — AZITHROMYCIN 250 MG PO TABS: ORAL_TABLET | ORAL | 0 refills | Status: AC

## 2020-04-01 MED ORDER — BENZONATATE 100 MG PO CAPS
100.0000 mg | ORAL_CAPSULE | Freq: Three times a day (TID) | ORAL | 0 refills | Status: DC
Start: 2020-04-01 — End: 2021-08-26

## 2020-04-01 MED ORDER — ALBUTEROL SULFATE HFA 108 (90 BASE) MCG/ACT IN AERS
INHALATION_SPRAY | RESPIRATORY_TRACT | Status: AC
  Filled 2020-04-01: qty 6.7

## 2020-04-01 MED ORDER — ALBUTEROL SULFATE HFA 108 (90 BASE) MCG/ACT IN AERS
2.0000 | INHALATION_SPRAY | Freq: Once | RESPIRATORY_TRACT | Status: AC
  Administered 2020-04-01: 2 via RESPIRATORY_TRACT

## 2020-04-01 MED ORDER — PREDNISONE 20 MG PO TABS: 40.0000 mg | ORAL_TABLET | Freq: Every day | ORAL | 0 refills | Status: AC

## 2020-04-01 NOTE — Discharge Instructions (Addendum)
Self isolate until covid results are back and negative.  Will notify you by phone of any positive findings. Your negative results will be sent through your MyChart.     Use of inhaler as needed for wheezing or shortness of breath.   Continue with mucinex to keep mucus thin and productive as able.  Tylenol and/or ibuprofen as needed for pain or fevers.   Complete prednisone pack.  Tessalon as needed for cough.  Push fluids to ensure adequate hydration and keep secretions thin.  I have sent antibiotics, please await your covid testing. If positive do not take antibiotics. If negative and no improvement you may take.  If any worsening- increased shortness of breath , difficulty breathing, fevers, or otherwise worsening please return.

## 2020-04-01 NOTE — ED Triage Notes (Signed)
Pt here with cough and nasal congestion x 4 days; denies fever

## 2020-04-02 LAB — SARS CORONAVIRUS 2 (TAT 6-24 HRS): SARS Coronavirus 2: NEGATIVE

## 2020-04-03 NOTE — ED Provider Notes (Signed)
[place]    CSN: [id number] Arrival date & time: 04/01/20  1909      History   Chief Complaint Chief Complaint  Patient presents with  . Cough    HPI [name] is a 49 y.o. female.   [name] presents with complaints of cough, chest tightness, nasal drainage. Started 7/17. Initially with sore throat but this has improved. No fevers. Has been taking mucinex and tylenol which have minimally helped. Some loose stools. No other nausea or vomiting. No history of asthma or copd. She does smoke. No known ill contacts. She [place] at the [employer].    ROS per HPI, negative if not otherwise mentioned.      Past Medical History:  Diagnosis Date  . Arthritis    right foot  . GERD (gastroesophageal reflux disease)   . History of alcoholism ([place])   . History of cellulitis    2015  right side face  . Hypertension    followed by pcp  (per pt never had stress test)  . Malnutrition ([place])   . Metatarsal fracture    right 2nd  . Peripheral neuropathy    feet  . Strachan's syndrome    painful neuropathy  . Wears dentures    upper  . Wears glasses     Patient Active Problem List   Diagnosis Date Noted  . Tobacco abuse 03/19/2019  . Vitamin D deficiency 10/25/2018  . Closed fracture of bone of right foot 07/25/2018  . Insomnia 10/02/2017  . Ingrown toenail 04/05/2017  . Closed nondisplaced fracture of proximal phalanx of lesser toe of right foot 04/05/2017  . 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 Peripheral neuropathy 12/24/2013  . Lower extremity pain, bilateral 12/18/2013    Past Surgical History:  Procedure Laterality Date  . MULTIPLE EXTRACTIONS WITH ALVEOLOPLASTY N/A 12/25/2013   Procedure: Extraction of tooth #'s 2,3,4,5,6,8,9,10,11,12,13,14,15,20,21,22,23,24,25,26,27, [phone] with alveoloplasty and bilateral mandibular tori reductions;  Surgeon: Lenn Cal, DDS;  Location: [facility];  Service: Oral Surgery;   Laterality: N/A;  . ORIF TOE FRACTURE Right 07/03/2019   Procedure: OPEN REDUCTION INTERNAL FIXATION (ORIF) METATARSAL (TOE) FRACTURE;  Surgeon: Trula Slade, [name];  Location: [facility];  Service: Podiatry;  Laterality: Right;  LEG BLOCK  . SURAL NERVE BX Right 12/24/2013   Procedure: SURAL NERVE BIOPSY;  Surgeon: Kristeen Miss, MD;  Location: [facility];  Service: Neurosurgery;  Laterality: Right;  . TUBAL LIGATION Bilateral 2002   PPTL    OB History   No obstetric history on file.      Home Medications    Prior to Admission medications   Medication Sig Start Date End Date Taking? Authorizing Provider  amLODipine (NORVASC) 5 MG tablet Take 1 tablet (5 mg total) by mouth daily. Must make PCP appointment. 01/24/20   Charlott Rakes, MD  azithromycin (ZITHROMAX) 250 MG tablet Take 2 tablets (500 mg total) by mouth daily for 1 day, THEN 1 tablet (250 mg total) daily for 4 days. 04/01/20 04/06/20  Zigmund Gottron, NP  benzonatate (TESSALON) 100 MG capsule Take 1 capsule (100 mg total) by mouth every 8 (eight) hours. 04/01/20   Zigmund Gottron, NP  DULoxetine (CYMBALTA) 60 MG capsule TAKE 1 CAPSULE BY MOUTH TWICE A DAY 01/15/20   Charlott Rakes, MD  hydrALAZINE (APRESOLINE) 10 MG tablet Take 1 tablet (10 mg total) by mouth 3 (three) times daily. 06/12/19   Charlott Rakes, MD  HYDROcodone-acetaminophen (NORCO/VICODIN) 5-325 MG tablet Take 1 tablet by mouth every 8 (eight) hours as needed. 12/24/19   Trula Slade, [name]  hydrOXYzine (ATARAX/VISTARIL) 25 MG tablet TAKE 1 TABLET BY MOUTH THREE TIMES A DAY AS NEEDED 11/21/19   Charlott Rakes, MD  KLOR-CON M10 10 MEQ tablet TAKE 1 TABLET BY MOUTH EVERY DAY 03/17/20   Charlott Rakes, MD  Multiple Vitamins-Minerals (MULTIVITAMIN WITH MINERALS) tablet Take 1 tablet by mouth daily.    [provider]  omeprazole (PRILOSEC) 20 MG capsule TAKE 1 CAPSULE BY MOUTH EVERY DAY 03/17/20   Charlott Rakes, MD  predniSONE (DELTASONE)  20 MG tablet Take 2 tablets (40 mg total) by mouth daily with breakfast for 5 days. 04/01/20 04/06/20  Zigmund Gottron, NP  pregabalin (LYRICA) 150 MG capsule Take 1 capsule three times a day 01/28/20   Cameron Sprang, MD  traZODone (DESYREL) 100 MG tablet Take 1 tablet (100 mg total) by mouth at bedtime as needed for sleep. 06/12/19   Charlott Rakes, MD  Vitamin D, Ergocalciferol, (DRISDOL) 1.25 MG (50000 UNIT) CAPS capsule TAKE 1 CAPSULE (50,000 UNITS TOTAL) BY MOUTH EVERY 7 (SEVEN) DAYS. PLEASE MAKE PCP APPOINTMENT. 11/21/19   Charlott Rakes, MD    Family History Family History  Adopted: Yes  Problem Relation Age of Onset  . Diabetes Father   . Hypertension Mother   . Raynaud syndrome Mother   . Diabetes Maternal Aunt   . Heart disease Maternal Aunt   . Diabetes Maternal Uncle   . Heart disease Maternal Uncle   . Drug abuse Maternal Grandmother   . Cancer Maternal Grandmother        intestinal  . Hypertension Maternal Grandmother   . Cancer Maternal Grandfather        lung  . Hypertension Paternal Grandfather     Social History Social History   Tobacco Use  . Smoking status: Current Every Day Smoker    Packs/day: 0.50    Years: 20.00    Pack years: 10.00    Types: Cigarettes  . Smokeless tobacco: Never Used  Vaping Use  . Vaping Use: Never used  Substance Use Topics  . Alcohol use: Yes    Comment: 07-02-2019-- per pt 4 drinks per day  . Drug use: Not Currently    Comment: 07-02-2019  -- per pt last used in age 66s     Allergies   Macrobid [nitrofurantoin macrocrystal]   Review of Systems Review of Systems   Physical Exam Triage Vital Signs ED Triage Vitals  Enc Vitals Group     BP 04/01/20 1957 (!) 160/95     Pulse Rate 04/01/20 1957 85     Resp 04/01/20 1957 18     Temp 04/01/20 1957 98.3 F (36.8 C)     Temp Source 04/01/20 1957 Oral     SpO2 04/01/20 1957 97 %     Weight --      Height --      Head Circumference --      Peak Flow --      Pain  Score 04/01/20 1958 5     Pain Loc --      Pain Edu? --      Excl. in [place]? --    No data found.  Updated Vital Signs BP (!) 160/95 (BP Location: Right Arm)   Pulse 85   Temp 98.3 F (36.8 C) (Oral)   Resp 18   SpO2 97%    Physical  Exam Constitutional:      General: She is not in acute distress.    Appearance: She is well-developed.  HENT:     Nose: Rhinorrhea present.     Mouth/Throat:     Mouth: Mucous membranes are moist.  Cardiovascular:     Rate and Rhythm: Normal rate.  Pulmonary:     Effort: Pulmonary effort is normal.     Breath sounds: Wheezing present.     Comments: Coarse congested cough noted with coarse lung sounds as well as wheezes noted  Skin:    General: Skin is warm and dry.  Neurological:     Mental Status: She is alert and oriented to person, place, and time.      UC Treatments / Results  Labs (all labs ordered are listed, but only abnormal results are displayed) Labs Reviewed  SARS CORONAVIRUS 2 (TAT 6-24 HRS)    EKG   Radiology No results found.  Procedures Procedures (including critical care time)  Medications Ordered in UC Medications  albuterol (VENTOLIN HFA) 108 (90 Base) MCG/ACT inhaler 2 puff (2 puffs Inhalation Given 04/01/20 2020)    Initial Impression / Assessment and Plan / UC Course  I have reviewed the triage vital signs and the nursing notes.  Pertinent labs & imaging results that were available during my care of the patient were reviewed by me and considered in my medical decision making (see chart for details).     No work of breathing. Afebrile. Vitals stable. History and physical consistent with viral illness.  covid testing pending. Bronchitis considered as well with steroids and inhaler provided, encouraged decreased to quit smoking. Azithromycin have and hold script provided. Return precautions provided. Patient verbalized understanding and agreeable to plan.   Final Clinical Impressions(s) / UC Diagnoses    Final diagnoses:  Viral URI with cough     Discharge Instructions     Self isolate until covid results are back and negative.  Will notify you by phone of any positive findings. Your negative results will be sent through your MyChart.     Use of inhaler as needed for wheezing or shortness of breath.   Continue with mucinex to keep mucus thin and productive as able.  Tylenol and/or ibuprofen as needed for pain or fevers.   Complete prednisone pack.  Tessalon as needed for cough.  Push fluids to ensure adequate hydration and keep secretions thin.  I have sent antibiotics, please await your covid testing. If positive do not take antibiotics. If negative and no improvement you may take.  If any worsening- increased shortness of breath , difficulty breathing, fevers, or otherwise worsening please return.    ED Prescriptions    Medication Sig Dispense Auth. Provider   predniSONE (DELTASONE) 20 MG tablet Take 2 tablets (40 mg total) by mouth daily with breakfast for 5 days. 10 tablet Augusto Gamble B, NP   azithromycin (ZITHROMAX) 250 MG tablet Take 2 tablets (500 mg total) by mouth daily for 1 day, THEN 1 tablet (250 mg total) daily for 4 days. 6 tablet Augusto Gamble B, NP   benzonatate (TESSALON) 100 MG capsule Take 1 capsule (100 mg total) by mouth every 8 (eight) hours. 21 capsule Zigmund Gottron, NP     PDMP not reviewed this encounter.   Zigmund Gottron, NP 04/03/20 [phone]

## 2020-04-06 ENCOUNTER — Encounter: Payer: Self-pay | Admitting: Podiatry

## 2020-04-08 ENCOUNTER — Other Ambulatory Visit: Payer: Self-pay | Admitting: Family Medicine

## 2020-04-08 DIAGNOSIS — I10 Essential (primary) hypertension: Secondary | ICD-10-CM

## 2020-04-22 ENCOUNTER — Other Ambulatory Visit: Payer: Self-pay | Admitting: Family Medicine

## 2020-04-22 DIAGNOSIS — I10 Essential (primary) hypertension: Secondary | ICD-10-CM

## 2020-04-22 DIAGNOSIS — G6289 Other specified polyneuropathies: Secondary | ICD-10-CM

## 2020-04-22 NOTE — Telephone Encounter (Signed)
Requested  medications are  due for refill today yes  Requested medications are on the active medication list yes  Last refill 6/28  Last visit 06/12/2019  Future visit scheduled no  Notes to clinic Failed protocol due to no visit within 6 months

## 2020-04-22 NOTE — Telephone Encounter (Signed)
Pharmacy requesting changes to 30 day courtesy fill- sent for review of request. No appointment scheduled.

## 2020-04-24 ENCOUNTER — Other Ambulatory Visit: Payer: Self-pay | Admitting: Family Medicine

## 2020-04-24 DIAGNOSIS — I10 Essential (primary) hypertension: Secondary | ICD-10-CM

## 2020-04-24 DIAGNOSIS — G6289 Other specified polyneuropathies: Secondary | ICD-10-CM

## 2020-04-24 MED ORDER — AMLODIPINE BESYLATE 5 MG PO TABS: 5.0000 mg | ORAL_TABLET | Freq: Every day | ORAL | 1 refills | Status: DC

## 2020-04-24 MED ORDER — POTASSIUM CHLORIDE CRYS ER 10 MEQ PO TBCR: 10.0000 meq | EXTENDED_RELEASE_TABLET | Freq: Every day | ORAL | 1 refills | Status: DC

## 2020-04-24 MED ORDER — DULOXETINE HCL 60 MG PO CPEP: 60.0000 mg | ORAL_CAPSULE | Freq: Two times a day (BID) | ORAL | 1 refills | Status: DC

## 2020-04-24 NOTE — Telephone Encounter (Signed)
Requested medication (s) are due for refill today: yes  Requested medication (s) are on the active medication list: yes   Future visit scheduled: yes  Notes to clinic: patient has appointment for 06/17/2020 Please review for a short supply until appointment    Requested Prescriptions  Pending Prescriptions Disp Refills   DULoxetine (CYMBALTA) 60 MG capsule 60 capsule 2    Sig: Take 1 capsule (60 mg total) by mouth 2 (two) times daily.      Psychiatry: Antidepressants - SNRI Failed - 04/24/2020  2:43 PM      Failed - Last BP in normal range    BP Readings from Last 1 Encounters:  04/01/20 (!) 160/95          Failed - Valid encounter within last 6 months    Recent Outpatient Visits           6 months ago Suspected COVID-19 virus infection   [place], [place], MD   10 months ago Screening for diabetes mellitus   [place], Charlane Ferretti, MD   1 year ago Neck mass   [place], Enobong, MD   1 year ago Gastroesophageal reflux disease, esophagitis presence not specified   [place], Enobong, MD   2 years ago Essential hypertension   [place], Charlane Ferretti, MD       Future Appointments             In 1 month Charlott Rakes, MD [place]              amLODipine (NORVASC) 5 MG tablet 30 tablet 0    Sig: Take 1 tablet (5 mg total) by mouth daily. Must make PCP appointment.      Cardiovascular:  Calcium Channel Blockers Failed - 04/24/2020  2:43 PM      Failed - Last BP in normal range    BP Readings from Last 1 Encounters:  04/01/20 (!) 160/95          Failed - Valid encounter within last 6 months    Recent Outpatient Visits           6 months ago Suspected COVID-19 virus infection   [place], [place], MD    10 months ago Screening for diabetes mellitus   [place], Charlane Ferretti, MD   1 year ago Neck mass   [place], Enobong, MD   1 year ago Gastroesophageal reflux disease, esophagitis presence not specified   [place], Enobong, MD   2 years ago Essential hypertension   [place], Charlane Ferretti, MD       Future Appointments             In 1 month Charlott Rakes, MD [place]              potassium chloride (KLOR-CON M10) 10 MEQ tablet 30 tablet 5    Sig: Take 1 tablet (10 mEq total) by mouth daily.      Endocrinology:  Minerals - Potassium Supplementation Passed - 04/24/2020  2:43 PM      Passed - K in normal range and within 360 days    Potassium  Date Value Ref Range  Status  07/03/2019 3.8 3.5 - 5.1 mmol/L Final          Passed - Cr in normal range and within 360 days    Creat  Date Value Ref Range Status  07/19/2016 0.70 0.50 - 1.10 mg/dL Final   Creatinine, Ser  Date Value Ref Range Status  07/03/2019 0.50 0.44 - 1.00 mg/dL Final   Creatinine, Urine  Date Value Ref Range Status  12/19/2013 69.70 mg/dL Final          Passed - Valid encounter within last 12 months    Recent Outpatient Visits           6 months ago Suspected COVID-19 virus infection   [place], Charlane Ferretti, MD   10 months ago Screening for diabetes mellitus   [place], Charlane Ferretti, MD   1 year ago Neck mass   [place], Enobong, MD   1 year ago Gastroesophageal reflux disease, esophagitis presence not specified   [place], Enobong, MD   2 years ago Essential hypertension   [place], Enobong, MD       Future Appointments              In 1 month Charlott Rakes, MD [place]

## 2020-04-24 NOTE — Telephone Encounter (Signed)
Medication Refill - Medication: amLODipine (NORVASC) 5 MG tablet,DULoxetine (CYMBALTA) 60 MG capsule,KLOR-CON M10 10 MEQ tablet (Patient is requesting supply of medication to hold her until her upcoming appt. Patient is completely out of medication)  Has the patient contacted their pharmacy? yes (Agent: If no, request that the patient contact the pharmacy for the refill.) (Agent: If yes, when and what did the pharmacy advise?)Contact PCP  Preferred Pharmacy (with phone number or street name):  [id number] - [place], [place] Phone:  [phone]  Fax:  [phone]       Agent: Please be advised that RX refills may take up to 3 business days. We ask that you follow-up with your pharmacy.

## 2020-04-24 NOTE — Telephone Encounter (Signed)
Future visit in 1 month  

## 2020-05-16 ENCOUNTER — Other Ambulatory Visit: Payer: Self-pay | Admitting: Family Medicine

## 2020-05-16 DIAGNOSIS — I10 Essential (primary) hypertension: Secondary | ICD-10-CM

## 2020-05-16 DIAGNOSIS — G6289 Other specified polyneuropathies: Secondary | ICD-10-CM

## 2020-05-16 NOTE — Telephone Encounter (Signed)
Requested Prescriptions  Pending Prescriptions Disp Refills  . hydrALAZINE (APRESOLINE) 10 MG tablet [Pharmacy Med Name: HYDRALAZINE 10 MG TABLET] 270 tablet 1    Sig: TAKE 1 TABLET BY MOUTH THREE TIMES A DAY     Cardiovascular:  Vasodilators Failed - 05/16/2020  8:30 AM      Failed - WBC in normal range and within 360 days    WBC  Date Value Ref Range Status  06/12/2019 13.7 (H) 3.4 - 10.8 x10E3/uL Final  02/28/2017 18.3 (H) 4.0 - 10.5 K/uL Final         Failed - Last BP in normal range    BP Readings from Last 1 Encounters:  04/01/20 (!) 160/95         Passed - HCT in normal range and within 360 days    HCT  Date Value Ref Range Status  07/03/2019 43.0 36 - 46 % Final   Hematocrit  Date Value Ref Range Status  06/12/2019 42.6 34.0 - 46.6 % Final         Passed - HGB in normal range and within 360 days    Hemoglobin  Date Value Ref Range Status  07/03/2019 14.6 12.0 - 15.0 g/dL Final  06/12/2019 14.5 11.1 - 15.9 g/dL Final         Passed - RBC in normal range and within 360 days    RBC  Date Value Ref Range Status  06/12/2019 4.17 3.77 - 5.28 x10E6/uL Final  02/28/2017 3.79 (L) 3.87 - 5.11 MIL/uL Final         Passed - PLT in normal range and within 360 days    Platelets  Date Value Ref Range Status  06/12/2019 286 150 - 450 x10E3/uL Final         Passed - Valid encounter within last 12 months    Recent Outpatient Visits          6 months ago Suspected COVID-19 virus infection   [place], [place], MD   11 months ago Screening for diabetes mellitus   [place], Charlane Ferretti, MD   1 year ago Neck mass   [place], Charlane Ferretti, MD   1 year ago Gastroesophageal reflux disease, esophagitis presence not specified   [place], Charlane Ferretti, MD   2 years ago Essential hypertension   [place], Enobong, MD      Future Appointments            In 1 month Charlott Rakes, MD [place]

## 2020-06-15 ENCOUNTER — Other Ambulatory Visit: Payer: Self-pay | Admitting: Family Medicine

## 2020-06-15 DIAGNOSIS — G6289 Other specified polyneuropathies: Secondary | ICD-10-CM

## 2020-06-17 ENCOUNTER — Other Ambulatory Visit: Payer: Self-pay

## 2020-06-17 ENCOUNTER — Encounter: Payer: Self-pay | Admitting: Family Medicine

## 2020-06-17 ENCOUNTER — Ambulatory Visit: Payer: Medicaid Other | Attending: Family Medicine | Admitting: Family Medicine

## 2020-06-17 VITALS — BP 111/70 | HR 86 | Ht 65.0 in | Wt 142.0 lb

## 2020-06-17 DIAGNOSIS — K219 Gastro-esophageal reflux disease without esophagitis: Secondary | ICD-10-CM | POA: Diagnosis not present

## 2020-06-17 DIAGNOSIS — Z23 Encounter for immunization: Secondary | ICD-10-CM

## 2020-06-17 DIAGNOSIS — F419 Anxiety disorder, unspecified: Secondary | ICD-10-CM

## 2020-06-17 DIAGNOSIS — F32A Depression, unspecified: Secondary | ICD-10-CM | POA: Diagnosis not present

## 2020-06-17 DIAGNOSIS — G4709 Other insomnia: Secondary | ICD-10-CM | POA: Diagnosis not present

## 2020-06-17 DIAGNOSIS — G6289 Other specified polyneuropathies: Secondary | ICD-10-CM | POA: Diagnosis not present

## 2020-06-17 DIAGNOSIS — R630 Anorexia: Secondary | ICD-10-CM | POA: Diagnosis not present

## 2020-06-17 DIAGNOSIS — I10 Essential (primary) hypertension: Secondary | ICD-10-CM

## 2020-06-17 MED ORDER — OMEPRAZOLE 20 MG PO CPDR: 20.0000 mg | DELAYED_RELEASE_CAPSULE | Freq: Every day | ORAL | 1 refills | Status: DC

## 2020-06-17 MED ORDER — AMLODIPINE BESYLATE 5 MG PO TABS: 5.0000 mg | ORAL_TABLET | Freq: Every day | ORAL | 1 refills | Status: DC

## 2020-06-17 MED ORDER — POTASSIUM CHLORIDE CRYS ER 10 MEQ PO TBCR
10.0000 meq | EXTENDED_RELEASE_TABLET | Freq: Every day | ORAL | 1 refills | Status: DC
Start: 2020-06-17 — End: 2020-12-15

## 2020-06-17 MED ORDER — MIRTAZAPINE 15 MG PO TABS: 15.0000 mg | ORAL_TABLET | Freq: Every day | ORAL | 1 refills | Status: DC

## 2020-06-17 MED ORDER — BUSPIRONE HCL 5 MG PO TABS: 5.0000 mg | ORAL_TABLET | Freq: Two times a day (BID) | ORAL | 1 refills | Status: DC

## 2020-06-17 NOTE — Progress Notes (Signed)
Subjective:  Patient ID: [name], female    DOB: 08-Oct-1970  Age: 49 y.o. 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  She is concerned about her appetitie as she eats once/day.  She took Remeron in the past and would like to get back on it.  Also has insomnia for which she uses trazodone as needed. Requests change from Hydroxyzine as it just made her sleepy but did not help much with her anxiety.  She is also on Cymbalta for anxiety and depression.  Her peripheral neuropathy is constant and occurs from her knees down to her legs and she is prescribed Lyrica by neurology.  On a scale of 1-10 she rates her neuropathic symptoms as an 8/10.  Past Medical History:  Diagnosis Date  . Arthritis    right foot  . GERD (gastroesophageal reflux disease)   . History of alcoholism ([place])   . History of cellulitis    2015  right side face  . Hypertension    followed by pcp  (per pt never had stress test)  . Malnutrition ([place])   . Metatarsal fracture    right 2nd  . Peripheral neuropathy    feet  . Strachan's syndrome    painful neuropathy  . Wears dentures    upper  . Wears glasses     Past Surgical History:  Procedure Laterality Date  . MULTIPLE EXTRACTIONS WITH ALVEOLOPLASTY N/A 12/25/2013   Procedure: Extraction of tooth #'s 2,3,4,5,6,8,9,10,11,12,13,14,15,20,21,22,23,24,25,26,27, [phone] with alveoloplasty and bilateral mandibular tori reductions;  Surgeon: Lenn Cal, DDS;  Location: [facility];  Service: Oral Surgery;  Laterality: N/A;  . ORIF TOE FRACTURE Right 07/03/2019   Procedure: OPEN REDUCTION INTERNAL FIXATION (ORIF) METATARSAL (TOE) FRACTURE;  Surgeon: Trula Slade, [name];  Location: [facility];  Service: Podiatry;  Laterality: Right;  LEG  BLOCK  . SURAL NERVE BX Right 12/24/2013   Procedure: SURAL NERVE BIOPSY;  Surgeon: Kristeen Miss, MD;  Location: [facility];  Service: Neurosurgery;  Laterality: Right;  . TUBAL LIGATION Bilateral 2002   PPTL    Family History  Adopted: Yes  Problem Relation Age of Onset  . Diabetes Father   . Hypertension Mother   . Raynaud syndrome Mother   . Diabetes Maternal Aunt   . Heart disease Maternal Aunt   . Diabetes Maternal Uncle   . Heart disease Maternal Uncle   . Drug abuse Maternal Grandmother   . Cancer Maternal Grandmother        intestinal  . Hypertension Maternal Grandmother   . Cancer Maternal Grandfather        lung  . Hypertension Paternal Grandfather     Allergies  Allergen Reactions  . Macrobid [Nitrofurantoin Macrocrystal] Anaphylaxis and Other (See Comments)    Patient reports 2 syncope episodes after her mouth became numb    Outpatient Medications Prior to Visit  Medication Sig Dispense Refill  . DULoxetine (CYMBALTA) 60 MG capsule TAKE 1 CAPSULE (60 MG TOTAL) BY MOUTH 2 (TWO) TIMES DAILY. 180 capsule 1  . hydrALAZINE (APRESOLINE) 10 MG tablet TAKE 1 TABLET BY MOUTH THREE TIMES A DAY 270 tablet 1  . Multiple Vitamins-Minerals (MULTIVITAMIN WITH MINERALS) tablet Take 1 tablet by mouth daily.    . pregabalin (LYRICA) 150 MG capsule Take 1 capsule  three times a day 90 capsule 5  . traZODone (DESYREL) 100 MG tablet Take 1 tablet (100 mg total) by mouth at bedtime as needed for sleep. 30 tablet 6  . amLODipine (NORVASC) 5 MG tablet Take 1 tablet (5 mg total) by mouth daily. Must make PCP appointment. 30 tablet 1  . KLOR-CON M10 10 MEQ tablet TAKE 1 TABLET BY MOUTH EVERY DAY 30 tablet 1  . omeprazole (PRILOSEC) 20 MG capsule TAKE 1 CAPSULE BY MOUTH EVERY DAY 90 capsule 0  . benzonatate (TESSALON) 100 MG capsule Take 1 capsule (100 mg total) by mouth every 8 (eight) hours. (Patient not taking: Reported on 06/17/2020) 21 capsule 0  . HYDROcodone-acetaminophen  (NORCO/VICODIN) 5-325 MG tablet Take 1 tablet by mouth every 8 (eight) hours as needed. (Patient not taking: Reported on 06/17/2020) 10 tablet 0  . Vitamin D, Ergocalciferol, (DRISDOL) 1.25 MG (50000 UNIT) CAPS capsule TAKE 1 CAPSULE (50,000 UNITS TOTAL) BY MOUTH EVERY 7 (SEVEN) DAYS. PLEASE MAKE PCP APPOINTMENT. (Patient not taking: Reported on 06/17/2020) 4 capsule 0  . hydrOXYzine (ATARAX/VISTARIL) 25 MG tablet TAKE 1 TABLET BY MOUTH THREE TIMES A DAY AS NEEDED (Patient not taking: Reported on 06/17/2020) 60 tablet 2   No facility-administered medications prior to visit.     ROS Review of Systems  Constitutional: Positive for appetite change. Negative for activity change and fatigue.  HENT: Negative for congestion, sinus pressure and sore throat.   Eyes: Negative for visual disturbance.  Respiratory: Negative for cough, chest tightness, shortness of breath and wheezing.   Cardiovascular: Negative for chest pain and palpitations.  Gastrointestinal: Negative for abdominal distention, abdominal pain and constipation.  Endocrine: Negative for polydipsia.  Genitourinary: Negative for dysuria and frequency.  Musculoskeletal: Negative for arthralgias and back pain.  Skin: Negative for rash.  Neurological: Positive for numbness. Negative for tremors and light-headedness.  Hematological: Does not bruise/bleed easily.  Psychiatric/Behavioral: Positive for sleep disturbance. Negative for agitation and behavioral problems.    Objective:  BP 111/70   Pulse 86   Ht $R'5\' 5"'jm$  (1.651 m)   Wt 142 lb (64.4 kg)   SpO2 96%   BMI 23.63 kg/m   BP/Weight 06/17/2020 04/01/2020 1/44/3154  Systolic BP 008 676 -  Diastolic BP 70 95 -  Wt. (Lbs) 142 - 140  BMI 23.63 - 23.3      Physical Exam Constitutional:      Appearance: She is well-developed.  Neck:     Vascular: No JVD.  Cardiovascular:     Rate and Rhythm: Normal rate.     Heart sounds: Normal heart sounds. No murmur heard.   Pulmonary:      Effort: Pulmonary effort is normal.     Breath sounds: Normal breath sounds. No wheezing or rales.  Chest:     Chest wall: No tenderness.  Abdominal:     General: Bowel sounds are normal. There is no distension.     Palpations: Abdomen is soft. There is no mass.     Tenderness: There is no abdominal tenderness.  Musculoskeletal:        General: Normal range of motion.     Right lower leg: No edema.     Left lower leg: No edema.  Neurological:     Mental Status: She is alert and oriented to person, place, and time.  Psychiatric:        Mood and Affect: Mood normal.     CMP Latest Ref Rng & Units 07/03/2019 06/12/2019 03/19/2019  Glucose 70 - 99 mg/dL 109(H) 81 103(H)  BUN 6 - 20 mg/dL 4(L) 4(L) 4(L)  Creatinine 0.44 - 1.00 mg/dL 0.50 0.67 0.73  Sodium 135 - 145 mmol/L 139 139 144  Potassium 3.5 - 5.1 mmol/L 3.8 4.3 4.2  Chloride 98 - 111 mmol/L 97(L) 96 100  CO2 20 - 29 mmol/L - 29 29  Calcium 8.7 - 10.2 mg/dL - 9.1 9.2  Total Protein 6.0 - 8.5 g/dL - 6.8 6.5  Total Bilirubin 0.0 - 1.2 mg/dL - 0.4 0.3  Alkaline Phos 39 - 117 IU/L - 171(H) 168(H)  AST 0 - 40 IU/L - 32 30  ALT 0 - 32 IU/L - 11 13    Lipid Panel     Component Value Date/Time   CHOL 202 (H) 06/12/2019 1057   TRIG 203 (H) 06/12/2019 1057   HDL 45 06/12/2019 1057   CHOLHDL 4.5 (H) 06/12/2019 1057   CHOLHDL 4.0 07/19/2016 1238   VLDL 26 07/19/2016 1238   LDLCALC 121 (H) 06/12/2019 1057    CBC    Component Value Date/Time   WBC 13.7 (H) 06/12/2019 1057   WBC 18.3 (H) 02/28/2017 2130   RBC 4.17 06/12/2019 1057   RBC 3.79 (L) 02/28/2017 2130   HGB 14.6 07/03/2019 1211   HGB 14.5 06/12/2019 1057   HCT 43.0 07/03/2019 1211   HCT 42.6 06/12/2019 1057   PLT 286 06/12/2019 1057   MCV 102 (H) 06/12/2019 1057   MCH 34.8 (H) 06/12/2019 1057   MCH 35.1 (H) 02/28/2017 2130   MCHC 34.0 06/12/2019 1057   MCHC 35.4 02/28/2017 2130   RDW 13.5 06/12/2019 1057   LYMPHSABS 2.8 06/12/2019 1057   MONOABS 2.1 (H)  12/18/2013 1540   EOSABS 0.2 06/12/2019 1057   BASOSABS 0.1 06/12/2019 1057    Lab Results  Component Value Date   HGBA1C 5.1 (A) 06/12/2019    Assessment & Plan:  1. Essential hypertension Controlled Counseled on blood pressure goal of less than 130/80, low-sodium, DASH diet, medication compliance, 150 minutes of moderate intensity exercise per week. Discussed medication compliance, adverse effects. - CMP14+EGFR; Future - Lipid panel; Future - amLODipine (NORVASC) 5 MG tablet; Take 1 tablet (5 mg total) by mouth daily.  Dispense: 90 tablet; Refill: 1  2. Gastroesophageal reflux disease Stable - omeprazole (PRILOSEC) 20 MG capsule; Take 1 capsule (20 mg total) by mouth daily.  Dispense: 90 capsule; Refill: 1  3. Anxiety and depression Uncontrolled Switched from hydroxyzine to BuSpar due to sedation of the former Continue Cymbalta  4. Other insomnia Placed on Remeron which will also help with her appetite  5. Other polyneuropathy Stable on Lyrica Management per neurology  6. Poor appetite Placed on Remeron-advised of weight gain associated with Remeron  7. Need for immunization against influenza - Flu Vaccine QUAD 36+ mos IM    Meds ordered this encounter  Medications  . amLODipine (NORVASC) 5 MG tablet    Sig: Take 1 tablet (5 mg total) by mouth daily.    Dispense:  90 tablet    Refill:  1  . busPIRone (BUSPAR) 5 MG tablet    Sig: Take 1 tablet (5 mg total) by mouth 2 (two) times daily.    Dispense:  180 tablet    Refill:  1  . omeprazole (PRILOSEC) 20 MG capsule    Sig: Take 1 capsule (20 mg total) by mouth daily.    Dispense:  90 capsule    Refill:  1  .  mirtazapine (REMERON) 15 MG tablet    Sig: Take 1 tablet (15 mg total) by mouth at bedtime.    Dispense:  90 tablet    Refill:  1  . potassium chloride (KLOR-CON M10) 10 MEQ tablet    Sig: Take 1 tablet (10 mEq total) by mouth daily.    Dispense:  90 tablet    Refill:  1    Follow-up: No follow-ups  on file.       Charlott Rakes, MD, FAAFP. [facility] and [place], [place]   06/17/2020, 2:01 PM

## 2020-06-17 NOTE — Progress Notes (Signed)
Needs refills on medications. 

## 2020-06-17 NOTE — Patient Instructions (Signed)

## 2020-06-24 ENCOUNTER — Ambulatory Visit: Payer: Medicaid Other | Attending: Family Medicine

## 2020-06-24 ENCOUNTER — Telehealth: Payer: Self-pay

## 2020-06-24 ENCOUNTER — Other Ambulatory Visit: Payer: Self-pay

## 2020-06-24 DIAGNOSIS — I10 Essential (primary) hypertension: Secondary | ICD-10-CM

## 2020-06-24 NOTE — Telephone Encounter (Signed)
Please schedule a telehealth visit with any available clinician.

## 2020-06-24 NOTE — Telephone Encounter (Signed)
Patient states that she has been having very bad diarrhea, she states that it has been going on for 1 year now. States that she forgot to inform you at her last visit on 06/17/2020

## 2020-06-25 ENCOUNTER — Other Ambulatory Visit: Payer: Self-pay | Admitting: Family Medicine

## 2020-06-25 DIAGNOSIS — R7989 Other specified abnormal findings of blood chemistry: Secondary | ICD-10-CM

## 2020-06-25 LAB — CMP14+EGFR
ALT: 18 IU/L (ref 0–32)
AST: 33 IU/L (ref 0–40)
Albumin/Globulin Ratio: 1.2 (ref 1.2–2.2)
Albumin: 4.1 g/dL (ref 3.8–4.8)
Alkaline Phosphatase: 209 IU/L — ABNORMAL HIGH (ref 44–121)
BUN/Creatinine Ratio: 7 — ABNORMAL LOW (ref 9–23)
BUN: 5 mg/dL — ABNORMAL LOW (ref 6–24)
Bilirubin Total: 0.4 mg/dL (ref 0.0–1.2)
CO2: 25 mmol/L (ref 20–29)
Calcium: 9.1 mg/dL (ref 8.7–10.2)
Chloride: 96 mmol/L (ref 96–106)
Creatinine, Ser: 0.67 mg/dL (ref 0.57–1.00)
GFR calc Af Amer: 120 mL/min/{1.73_m2} (ref >59)
GFR calc non Af Amer: 104 mL/min/{1.73_m2} (ref >59)
Globulin, Total: 3.3 g/dL (ref 1.5–4.5)
Glucose: 129 mg/dL — ABNORMAL HIGH (ref 65–99)
Potassium: 3.6 mmol/L (ref 3.5–5.2)
Sodium: 140 mmol/L (ref 134–144)
Total Protein: 7.4 g/dL (ref 6.0–8.5)

## 2020-06-25 LAB — LIPID PANEL
Chol/HDL Ratio: 5.5 ratio — ABNORMAL HIGH (ref 0.0–4.4)
Cholesterol, Total: 186 mg/dL (ref 100–199)
HDL: 34 mg/dL — ABNORMAL LOW (ref >39)
LDL Chol Calc (NIH): 98 mg/dL (ref 0–99)
Triglycerides: 321 mg/dL — ABNORMAL HIGH (ref 0–149)
VLDL Cholesterol Cal: 54 mg/dL — ABNORMAL HIGH (ref 5–40)

## 2020-06-25 NOTE — Telephone Encounter (Signed)
Patient will be called and scheduled for an appointment by front office.

## 2020-06-26 NOTE — Progress Notes (Signed)
Patient has viewed results via mychart.  Appointment for CT scan will be scheduled and patient will be informed.

## 2020-07-06 ENCOUNTER — Ambulatory Visit (HOSPITAL_COMMUNITY)
Admission: RE | Admit: 2020-07-06 | Discharge: 2020-07-06 | Disposition: A | Discharge status: Home / Self Care | Payer: Medicaid Other | Source: Ambulatory Visit | Attending: Family Medicine | Admitting: Family Medicine

## 2020-07-06 ENCOUNTER — Other Ambulatory Visit: Payer: Self-pay

## 2020-07-06 DIAGNOSIS — R7989 Other specified abnormal findings of blood chemistry: Secondary | ICD-10-CM | POA: Diagnosis not present

## 2020-07-06 DIAGNOSIS — K8020 Calculus of gallbladder without cholecystitis without obstruction: Secondary | ICD-10-CM | POA: Diagnosis not present

## 2020-07-06 DIAGNOSIS — K760 Fatty (change of) liver, not elsewhere classified: Secondary | ICD-10-CM | POA: Diagnosis not present

## 2020-07-10 ENCOUNTER — Other Ambulatory Visit: Payer: Self-pay | Admitting: Family Medicine

## 2020-07-10 DIAGNOSIS — F32A Depression, unspecified: Secondary | ICD-10-CM

## 2020-07-10 DIAGNOSIS — F419 Anxiety disorder, unspecified: Secondary | ICD-10-CM

## 2020-07-10 NOTE — Telephone Encounter (Signed)
Requested medication (s) are due for refill today: No  Requested medication (s) are on the active medication list: yes  Last refill:  06/17/20  #180  1 refill  Future visit scheduled:yes  Notes to clinic:  pharmacy requesting 2 refills which will take there RX beyond 6 months.     Requested Prescriptions  Pending Prescriptions Disp Refills   busPIRone (BUSPAR) 5 MG tablet [Pharmacy Med Name: BUSPIRONE HCL 5 MG TABLET] 180 tablet 2    Sig: TAKE 1 TABLET BY MOUTH TWICE A DAY      Psychiatry: Anxiolytics/Hypnotics - Non-controlled Passed - 07/10/2020  2:31 PM      Passed - Valid encounter within last 6 months    Recent Outpatient Visits           3 weeks ago Essential hypertension   [place], Charlane Ferretti, MD   8 months ago Suspected COVID-19 virus infection   [place], Charlane Ferretti, MD   1 year ago Screening for diabetes mellitus   [place], Enobong, MD   1 year ago Neck mass   [place], Enobong, MD   1 year ago Gastroesophageal reflux disease, esophagitis presence not specified   [place], Enobong, MD       Future Appointments             In 3 weeks Charlott Rakes, MD [place]

## 2020-08-03 ENCOUNTER — Ambulatory Visit: Payer: Medicaid Other | Admitting: Family Medicine

## 2020-08-18 ENCOUNTER — Other Ambulatory Visit: Payer: Self-pay | Admitting: Neurology

## 2020-08-19 NOTE — Telephone Encounter (Signed)
This is in Dr. Lars Masson in box but think that the patient is yours?

## 2020-09-21 ENCOUNTER — Encounter: Payer: Medicaid Other | Admitting: Family Medicine

## 2020-10-06 ENCOUNTER — Encounter: Payer: Medicaid Other | Admitting: Family Medicine

## 2020-10-09 ENCOUNTER — Ambulatory Visit: Payer: Medicaid Other | Admitting: Neurology

## 2020-10-28 ENCOUNTER — Other Ambulatory Visit: Payer: Self-pay | Admitting: Neurology

## 2020-10-29 ENCOUNTER — Telehealth: Payer: Self-pay | Admitting: Neurology

## 2020-10-29 NOTE — Telephone Encounter (Signed)
I will discuss medication management at her appointment tomorrow.  No prescription will be sent until she has been evaluated.

## 2020-10-29 NOTE — Telephone Encounter (Signed)
Called patient and informed her that Dr. Posey Pronto will not be sending any prescriptions until she is evaluated in office and until she discusses medication management at her appt tomorrow. Patient verbalized understanding.

## 2020-10-29 NOTE — Telephone Encounter (Signed)
Patient called in wanting a refill of her Lyrica. She is out and doesn't want to wait until her appointment tomorrow.

## 2020-10-30 ENCOUNTER — Encounter: Payer: Self-pay | Admitting: Neurology

## 2020-10-30 ENCOUNTER — Other Ambulatory Visit: Payer: Self-pay

## 2020-10-30 ENCOUNTER — Ambulatory Visit: Payer: Medicaid Other | Admitting: Neurology

## 2020-10-30 ENCOUNTER — Other Ambulatory Visit (INDEPENDENT_AMBULATORY_CARE_PROVIDER_SITE_OTHER): Payer: Medicaid Other

## 2020-10-30 VITALS — BP 125/84 | HR 74 | Ht 65.0 in | Wt 143.0 lb

## 2020-10-30 DIAGNOSIS — G6289 Other specified polyneuropathies: Secondary | ICD-10-CM

## 2020-10-30 DIAGNOSIS — E5111 Dry beriberi: Secondary | ICD-10-CM

## 2020-10-30 DIAGNOSIS — E538 Deficiency of other specified B group vitamins: Secondary | ICD-10-CM

## 2020-10-30 DIAGNOSIS — G621 Alcoholic polyneuropathy: Secondary | ICD-10-CM

## 2020-10-30 LAB — B12 AND FOLATE PANEL
Folate: 21.5 ng/mL (ref >5.9)
Vitamin B-12: 699 pg/mL (ref 211–911)

## 2020-10-30 MED ORDER — PREGABALIN 150 MG PO CAPS: 150.0000 mg | ORAL_CAPSULE | Freq: Two times a day (BID) | ORAL | 5 refills | Status: DC

## 2020-10-30 MED ORDER — NORTRIPTYLINE HCL 10 MG PO CAPS: ORAL_CAPSULE | ORAL | 5 refills | Status: DC

## 2020-10-30 NOTE — Progress Notes (Signed)
Follow-up Visit   Date: 10/30/20    [name] MRN: [id number] DOB: 01-Oct-1970   Interim History: [name] is a 50 y.o. ight-handed Caucasian female with [name] syndrome, previous alcoholism, tobacco use, hypertension, and GERD returning to the clinic for follow-up of nutritional ataxic neuropathy.  The patient was accompanied to the clinic by self.  She reports having worsening burning pain and cold sensation in the feet.  She continues to have numbness in the hands and below the knees.  No recent falls.  She takes Lyrica $RemoveBefo'150mg'CKuWznlefIx$  TID and Cymbalta $RemoveBefo'60mg'JZoUVQZeGlT$  BID, and would like to explore other options for pain.  She has been on gabapentin $RemoveBefor'600mg'ygNYMUFubSOG$  five times daily in the past.  She reports having a few drink about once per week and is compliant with taking supplements.  She will be starting work at the [employer] next month.    Medications:  Current Outpatient Medications on File Prior to Visit  Medication Sig Dispense Refill  . amLODipine (NORVASC) 5 MG tablet Take 1 tablet (5 mg total) by mouth daily. 90 tablet 1  . benzonatate (TESSALON) 100 MG capsule Take 1 capsule (100 mg total) by mouth every 8 (eight) hours. (Patient not taking: Reported on 06/17/2020) 21 capsule 0  . busPIRone (BUSPAR) 5 MG tablet Take 1 tablet (5 mg total) by mouth 2 (two) times daily. 180 tablet 1  . DULoxetine (CYMBALTA) 60 MG capsule TAKE 1 CAPSULE (60 MG TOTAL) BY MOUTH 2 (TWO) TIMES DAILY. 180 capsule 1  . hydrALAZINE (APRESOLINE) 10 MG tablet TAKE 1 TABLET BY MOUTH THREE TIMES A DAY 270 tablet 1  . HYDROcodone-acetaminophen (NORCO/VICODIN) 5-325 MG tablet Take 1 tablet by mouth every 8 (eight) hours as needed. (Patient not taking: Reported on 06/17/2020) 10 tablet 0  . mirtazapine (REMERON) 15 MG tablet Take 1 tablet (15 mg total) by mouth at bedtime. 90 tablet 1  . Multiple Vitamins-Minerals (MULTIVITAMIN WITH MINERALS) tablet Take 1 tablet by mouth daily.    Marland Kitchen omeprazole (PRILOSEC) 20 MG capsule Take 1  capsule (20 mg total) by mouth daily. 90 capsule 1  . potassium chloride (KLOR-CON M10) 10 MEQ tablet Take 1 tablet (10 mEq total) by mouth daily. 90 tablet 1  . pregabalin (LYRICA) 150 MG capsule TAKE 1 CAPSULE BY MOUTH THREE TIMES A DAY 90 capsule 1  . traZODone (DESYREL) 100 MG tablet Take 1 tablet (100 mg total) by mouth at bedtime as needed for sleep. 30 tablet 6  . Vitamin D, Ergocalciferol, (DRISDOL) 1.25 MG (50000 UNIT) CAPS capsule TAKE 1 CAPSULE (50,000 UNITS TOTAL) BY MOUTH EVERY 7 (SEVEN) DAYS. PLEASE MAKE PCP APPOINTMENT. (Patient not taking: Reported on 06/17/2020) 4 capsule 0   No current facility-administered medications on file prior to visit.    Allergies:  Allergies  Allergen Reactions  . Macrobid [Nitrofurantoin Macrocrystal] Anaphylaxis and Other (See Comments)    Patient reports 2 syncope episodes after her mouth became numb     Vital Signs:  BP 125/84   Pulse 74   Ht $R'5\' 5"'Qa$  (1.651 m)   Wt 143 lb (64.9 kg)   SpO2 98%   BMI 23.80 kg/m    Neurological Exam: MENTAL STATUS including orientation to time, place, person, recent and remote memory, attention span and concentration, language, and fund of knowledge is normal.  Speech is not dysarthric.  MOTOR:  Motor strength is 5/5 in all extremities.  No atrophy, fasciculations or abnormal movements.  No pronator drift.  Tone is  normal.    MSRs:  Right                                                                 Left brachioradialis 2+  brachioradialis 2+  biceps 2+  biceps 2+  triceps 2+  triceps 2+  patellar 1+  Patellar 1+  ankle jerk 0  ankle jerk 0   SENSORY:  Intact to vibration is intact at the hands, reduced distal to knees and absent at the ankles bilaterally.  COORDINATION/GAIT:   Gait appears stable, unassisted. Unable to perform tandem gait    Data: NCS/EMG 01/20/2014:  Nerve conduction studies done on all 4 extremities show evidence of a mild to moderate primarily axonal peripheral neuropathy.  EMG evaluation of the right lower extremity was relatively unremarkable. Labs 12/2013:  ANA negative. RPR nonreactive, HIV nonreactive, ESR only at 5, rheumatoid factor negative, copper 127, Vitamin B12 at 381. Heavy metal screen is neg, SPEP no M protein CSF 12/24/2013:  CSF R1 W0 G60 P34  Labs 11/16/2016:  Vitamin B12 119*, vitamin B1 < 7, MMA 216, folate 2.2* Labs 09/11/2017:  Vitamin B12 1042, folate 23.6, vitamin B1 7*  IMPRESSION/PLAN: 1.  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  Worsening paresthesias on Lyrica 150mg  TID and patient expresses interest in trying something different.    - Start nortriptyline 10mg  at bedtime for 2 week, then increase to 2 tablet at bedtime  - Reduce Lyrica to 150mg  twice daily  - Counseled on alcohol cessation  2. Nutritional deficiency  - Continue vitamin B12 1025mcg daily, B1 100mg  daily, and folic acid 1mg  daily  - Check vitamin B12, thiamine, and folate  Return to clinic in 4 months  Thank you for allowing me to participate in patient's care.  If I can answer any additional questions, I would be pleased to do so.    Sincerely,    Yaretzi Ernandez K. Posey Pronto, DO

## 2020-10-30 NOTE — Patient Instructions (Addendum)
Check labs  Start nortriptyline 10mg  at bedtime for 2 week, then increase to 2 tablet at bedtime  Reduce Lyrica to 150mg  twice daily  Return to clinic in 4 months

## 2020-11-04 LAB — VITAMIN B1: Vitamin B1 (Thiamine): <6 nmol/L — ABNORMAL LOW (ref 8–30)

## 2020-11-09 ENCOUNTER — Encounter: Payer: Medicaid Other | Admitting: Family Medicine

## 2020-11-21 ENCOUNTER — Other Ambulatory Visit: Payer: Self-pay | Admitting: Neurology

## 2020-11-30 ENCOUNTER — Telehealth: Payer: Self-pay | Admitting: Neurology

## 2020-11-30 NOTE — Telephone Encounter (Signed)
Patient has been taking the Nortriptyline and she states it is not doing anything for her. Is there anything else she can try? Or any other options?

## 2020-11-30 NOTE — Telephone Encounter (Signed)
Recommend increasing nortriptyline to 50mg  at bedtime. If she is agreeable, we can send Rx.

## 2020-12-01 NOTE — Telephone Encounter (Signed)
Called patient and left a message for a call back.  

## 2020-12-02 MED ORDER — NORTRIPTYLINE HCL 50 MG PO CAPS: 50.0000 mg | ORAL_CAPSULE | Freq: Every day | ORAL | 3 refills | Status: DC

## 2020-12-02 NOTE — Telephone Encounter (Signed)
Rx sent 

## 2020-12-02 NOTE — Telephone Encounter (Signed)
Called patient and informed her of Dr. Serita Grit recommendations. Patient is agreeable to try Nortriptyline 50mg . Patient is aware a rx will be sent to her pharmacy.

## 2020-12-14 ENCOUNTER — Other Ambulatory Visit: Payer: Self-pay | Admitting: Family Medicine

## 2020-12-14 DIAGNOSIS — I10 Essential (primary) hypertension: Secondary | ICD-10-CM

## 2020-12-14 DIAGNOSIS — K219 Gastro-esophageal reflux disease without esophagitis: Secondary | ICD-10-CM

## 2020-12-14 DIAGNOSIS — G6289 Other specified polyneuropathies: Secondary | ICD-10-CM

## 2020-12-14 NOTE — Telephone Encounter (Signed)
Notes to clinic:  Patient has appointment on 12/15/2020    Requested Prescriptions  Pending Prescriptions Disp Refills   DULoxetine (CYMBALTA) 60 MG capsule [Pharmacy Med Name: DULOXETINE HCL DR 60 MG CAP] 180 capsule 1    Sig: TAKE 1 CAPSULE BY MOUTH 2 TIMES DAILY.      Psychiatry: Antidepressants - SNRI Passed - 12/14/2020  8:30 AM      Passed - Last BP in normal range    BP Readings from Last 1 Encounters:  10/30/20 125/84          Passed - Valid encounter within last 6 months    Recent Outpatient Visits           6 months ago Essential hypertension   [place], [place], MD   1 year ago Suspected COVID-19 virus infection   [place], Charlane Ferretti, MD   1 year ago Screening for diabetes mellitus   [place], Charlane Ferretti, MD   1 year ago Neck mass   [place], Charlane Ferretti, MD   2 years ago Gastroesophageal reflux disease, esophagitis presence not specified   [place], Charlane Ferretti, MD       Future Appointments             Tomorrow Charlott Rakes, MD [place]               hydrALAZINE (APRESOLINE) 10 MG tablet [Pharmacy Med Name: HYDRALAZINE 10 MG TABLET] 270 tablet 1    Sig: TAKE 1 TABLET BY MOUTH THREE TIMES A DAY      Cardiovascular:  Vasodilators Failed - 12/14/2020  8:30 AM      Failed - HCT in normal range and within 360 days    HCT  Date Value Ref Range Status  07/03/2019 43.0 36.0 - 46.0 % Final   Hematocrit  Date Value Ref Range Status  06/12/2019 42.6 34.0 - 46.6 % Final          Failed - HGB in normal range and within 360 days    Hemoglobin  Date Value Ref Range Status  07/03/2019 14.6 12.0 - 15.0 g/dL Final  06/12/2019 14.5 11.1 - 15.9 g/dL Final          Failed - RBC in normal range and within 360 days    RBC  Date Value Ref  Range Status  06/12/2019 4.17 3.77 - 5.28 x10E6/uL Final  02/28/2017 3.79 (L) 3.87 - 5.11 MIL/uL Final          Failed - WBC in normal range and within 360 days    WBC  Date Value Ref Range Status  06/12/2019 13.7 (H) 3.4 - 10.8 x10E3/uL Final  02/28/2017 18.3 (H) 4.0 - 10.5 K/uL Final          Failed - PLT in normal range and within 360 days    Platelets  Date Value Ref Range Status  06/12/2019 286 150 - 450 x10E3/uL Final          Passed - Last BP in normal range    BP Readings from Last 1 Encounters:  10/30/20 125/84          Passed - Valid encounter within last 12 months    Recent Outpatient Visits           6 months ago Essential hypertension   [facility], [place], MD   1 year ago Suspected COVID-19 virus infection   [place], [place]ong, MD   1 year ago Screening for diabetes mellitus   [place] Charlott Rakes, MD   1 year ago Neck mass   [place], Charlane Ferretti, MD   2 years ago Gastroesophageal reflux disease, esophagitis presence not specified   [place], En[place]ong, MD       Future Appointments             Tomorrow Charlott Rakes, MD [place]               omeprazole (PRILOSEC) 20 MG capsule [Pharmacy Med Name: OMEPRAZOLE DR 20 MG CAPSULE] 90 capsule 1    Sig: TAKE 1 [place]      Gastroenterology: Proton Pump Inhibitors Passed - 12/14/2020  8:30 AM      Passed - Valid encounter within last 12 months    Recent Outpatient Visits           6 months ago Essential hypertension   [place], [place], MD   1 year ago Suspected COVID-19 virus infection   [place], En[place]ong, MD   1 year ago Screening for diabetes mellitus   [place], Enobong, MD   1 year ago Neck mass   [place], Charlane Ferretti, MD   2 years ago Gastroesophageal reflux disease, esophagitis presence not specified   [place], Charlane Ferretti, MD       Future Appointments             Tomorrow Charlott Rakes, MD [place]

## 2020-12-15 ENCOUNTER — Encounter: Payer: Self-pay | Admitting: Family Medicine

## 2020-12-15 ENCOUNTER — Other Ambulatory Visit: Payer: Self-pay

## 2020-12-15 ENCOUNTER — Ambulatory Visit: Payer: Medicaid Other | Attending: Family Medicine | Admitting: Family Medicine

## 2020-12-15 ENCOUNTER — Other Ambulatory Visit (HOSPITAL_COMMUNITY)
Admission: RE | Admit: 2020-12-15 | Discharge: 2020-12-15 | Disposition: A | Discharge status: Home / Self Care | Payer: Medicaid Other | Source: Ambulatory Visit | Attending: Family Medicine | Admitting: Family Medicine

## 2020-12-15 VITALS — BP 112/69 | HR 78 | Ht 65.0 in | Wt 139.6 lb

## 2020-12-15 DIAGNOSIS — F419 Anxiety disorder, unspecified: Secondary | ICD-10-CM

## 2020-12-15 DIAGNOSIS — Z113 Encounter for screening for infections with a predominantly sexual mode of transmission: Secondary | ICD-10-CM | POA: Diagnosis not present

## 2020-12-15 DIAGNOSIS — Z1231 Encounter for screening mammogram for malignant neoplasm of breast: Secondary | ICD-10-CM | POA: Diagnosis not present

## 2020-12-15 DIAGNOSIS — Z124 Encounter for screening for malignant neoplasm of cervix: Secondary | ICD-10-CM

## 2020-12-15 DIAGNOSIS — F32A Depression, unspecified: Secondary | ICD-10-CM | POA: Diagnosis not present

## 2020-12-15 DIAGNOSIS — I10 Essential (primary) hypertension: Secondary | ICD-10-CM

## 2020-12-15 DIAGNOSIS — Z1211 Encounter for screening for malignant neoplasm of colon: Secondary | ICD-10-CM | POA: Diagnosis not present

## 2020-12-15 DIAGNOSIS — G6289 Other specified polyneuropathies: Secondary | ICD-10-CM

## 2020-12-15 DIAGNOSIS — Z0000 Encounter for general adult medical examination without abnormal findings: Secondary | ICD-10-CM

## 2020-12-15 DIAGNOSIS — K219 Gastro-esophageal reflux disease without esophagitis: Secondary | ICD-10-CM

## 2020-12-15 MED ORDER — OMEPRAZOLE 20 MG PO CPDR: DELAYED_RELEASE_CAPSULE | ORAL | 6 refills | Status: DC

## 2020-12-15 MED ORDER — BUSPIRONE HCL 5 MG PO TABS: 5.0000 mg | ORAL_TABLET | Freq: Two times a day (BID) | ORAL | 1 refills | Status: DC

## 2020-12-15 MED ORDER — DULOXETINE HCL 60 MG PO CPEP: 60.0000 mg | ORAL_CAPSULE | Freq: Two times a day (BID) | ORAL | 6 refills | Status: DC

## 2020-12-15 MED ORDER — AMLODIPINE BESYLATE 5 MG PO TABS: 5.0000 mg | ORAL_TABLET | Freq: Every day | ORAL | 1 refills | Status: DC

## 2020-12-15 MED ORDER — POTASSIUM CHLORIDE CRYS ER 10 MEQ PO TBCR: 10.0000 meq | EXTENDED_RELEASE_TABLET | Freq: Every day | ORAL | 1 refills | Status: DC

## 2020-12-15 MED ORDER — HYDRALAZINE HCL 10 MG PO TABS: 10.0000 mg | ORAL_TABLET | Freq: Three times a day (TID) | ORAL | 6 refills | Status: DC

## 2020-12-15 NOTE — Progress Notes (Signed)
Subjective:  Patient ID: [name], female    DOB: 12/25/1970  Age: 50 y.o. 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 She is due for mammogram, Pap smear and colonoscopy. Last Pap smear in 05/2017 was negative for malignancy  Her peripheral neuropathy is uncontrolled on current regimen of Lyrica, nortriptyline and she had a visit with her neurologist 2 months ago.  She sometimes feels she is walking on rocks more in her right foot and numbness occurs in her legs bilaterally.  For her anxiety and depression she is on Cymbalta and BuSpar and symptoms are stable.  Her Remeron for insomnia was discontinued by neurology given addition of nortriptyline. Hypertension is controlled on her current antihypertensives.  Past Medical History:  Diagnosis Date  . Arthritis    right foot  . GERD (gastroesophageal reflux disease)   . History of alcoholism ([place])   . History of cellulitis    2015  right side face  . Hypertension    followed by pcp  (per pt never had stress test)  . Malnutrition ([place])   . Metatarsal fracture    right 2nd  . Peripheral neuropathy    feet  . Strachan's syndrome    painful neuropathy  . Wears dentures    upper  . Wears glasses     Past Surgical History:  Procedure Laterality Date  . MULTIPLE EXTRACTIONS WITH ALVEOLOPLASTY N/A 12/25/2013   Procedure: Extraction of tooth #'s 2,3,4,5,6,8,9,10,11,12,13,14,15,20,21,22,23,24,25,26,27, [phone] with alveoloplasty and bilateral mandibular tori reductions;  Surgeon: Lenn Cal, DDS;  Location: [facility];  Service: Oral Surgery;  Laterality: N/A;  . ORIF TOE FRACTURE Right 07/03/2019   Procedure: OPEN REDUCTION INTERNAL FIXATION (ORIF) METATARSAL (TOE) FRACTURE;  Surgeon: Trula Slade, [name];  Location:  [facility];  Service: Podiatry;  Laterality: Right;  LEG BLOCK  . SURAL NERVE BX Right 12/24/2013   Procedure: SURAL NERVE BIOPSY;  Surgeon: Kristeen Miss, MD;  Location: [facility];  Service: Neurosurgery;  Laterality: Right;  . TUBAL LIGATION Bilateral 2002   PPTL    Family History  Adopted: Yes  Problem Relation Age of Onset  . Diabetes Father   . Hypertension Mother   . Raynaud syndrome Mother   . Diabetes Maternal Aunt   . Heart disease Maternal Aunt   . Diabetes Maternal Uncle   . Heart disease Maternal Uncle   . Drug abuse Maternal Grandmother   . Cancer Maternal Grandmother        intestinal  . Hypertension Maternal Grandmother   . Cancer Maternal Grandfather        lung  . Hypertension Paternal Grandfather     Allergies  Allergen Reactions  . Macrobid [Nitrofurantoin Macrocrystal] Anaphylaxis and Other (See Comments)    Patient reports 2 syncope episodes after her mouth became numb    Outpatient Medications Prior to Visit  Medication Sig Dispense Refill  . Multiple Vitamins-Minerals (MULTIVITAMIN WITH MINERALS) tablet Take 1 tablet by mouth daily.    . nortriptyline (PAMELOR) 50 MG capsule Take 1 capsule (50 mg total) by mouth at bedtime. 30 capsule 3  . pregabalin (LYRICA) 150 MG capsule TAKE 1 CAPSULE BY MOUTH THREE TIMES A DAY 90 capsule 1  . pregabalin (LYRICA) 150 MG capsule Take 1 capsule (150 mg total)  by mouth 2 (two) times daily. 60 capsule 5  . Vitamin D, Ergocalciferol, (DRISDOL) 1.25 MG (50000 UNIT) CAPS capsule TAKE 1 CAPSULE (50,000 UNITS TOTAL) BY MOUTH EVERY 7 (SEVEN) DAYS. PLEASE MAKE PCP APPOINTMENT. 4 capsule 0  . amLODipine (NORVASC) 5 MG tablet Take 1 tablet (5 mg total) by mouth daily. 90 tablet 1  . busPIRone (BUSPAR) 5 MG tablet Take 1 tablet (5 mg total) by mouth 2 (two) times daily. 180 tablet 1  . DULoxetine (CYMBALTA) 60 MG capsule TAKE 1 CAPSULE BY MOUTH 2 TIMES DAILY. 60 capsule 0  . hydrALAZINE (APRESOLINE) 10 MG tablet  TAKE 1 TABLET BY MOUTH THREE TIMES A DAY 90 tablet 0  . mirtazapine (REMERON) 15 MG tablet Take 1 tablet (15 mg total) by mouth at bedtime. 90 tablet 1  . omeprazole (PRILOSEC) 20 MG capsule TAKE 1 CAPSULE BY MOUTH EVERY DAY 30 capsule 0  . potassium chloride (KLOR-CON M10) 10 MEQ tablet Take 1 tablet (10 mEq total) by mouth daily. 90 tablet 1  . traZODone (DESYREL) 100 MG tablet Take 1 tablet (100 mg total) by mouth at bedtime as needed for sleep. 30 tablet 6  . benzonatate (TESSALON) 100 MG capsule Take 1 capsule (100 mg total) by mouth every 8 (eight) hours. (Patient not taking: No sig reported) 21 capsule 0  . HYDROcodone-acetaminophen (NORCO/VICODIN) 5-325 MG tablet Take 1 tablet by mouth every 8 (eight) hours as needed. (Patient not taking: No sig reported) 10 tablet 0   No facility-administered medications prior to visit.     ROS Review of Systems  Constitutional: Negative for activity change, appetite change and fatigue.  HENT: Negative for congestion, sinus pressure and sore throat.   Eyes: Negative for visual disturbance.  Respiratory: Negative for cough, chest tightness, shortness of breath and wheezing.   Cardiovascular: Negative for chest pain and palpitations.  Gastrointestinal: Negative for abdominal distention, abdominal pain and constipation.  Endocrine: Negative for polydipsia.  Genitourinary: Negative for dysuria and frequency.  Musculoskeletal: Negative for arthralgias and back pain.  Skin: Negative for rash.  Neurological: Positive for numbness. Negative for tremors and light-headedness.  Hematological: Does not bruise/bleed easily.  Psychiatric/Behavioral: Negative for agitation and behavioral problems.    Objective:  BP 112/69   Pulse 78   Ht 5\' 5"  (1.651 m)   Wt 139 lb 9.6 oz (63.3 kg)   SpO2 91%   BMI 23.23 kg/m   BP/Weight 12/15/2020 10/30/2020 61/02/736  Systolic BP 106 269 485  Diastolic BP 69 84 70  Wt. (Lbs) 139.6 143 142  BMI 23.23 23.8 23.63       Physical Exam Constitutional:      General: She is not in acute distress.    Appearance: She is well-developed. She is not diaphoretic.  HENT:     Head: Normocephalic.     Right Ear: External ear normal.     Left Ear: External ear normal.     Nose: Nose normal.  Eyes:     Conjunctiva/sclera: Conjunctivae normal.     Pupils: Pupils are equal, round, and reactive to light.  Neck:     Vascular: No JVD.  Cardiovascular:     Rate and Rhythm: Normal rate and regular rhythm.     Heart sounds: Normal heart sounds. No murmur heard. No gallop.   Pulmonary:     Effort: Pulmonary effort is normal. No respiratory distress.     Breath sounds: Normal breath sounds. No wheezing or rales.  Chest:  Chest wall: No tenderness.  Breasts:     Right: No mass, tenderness, axillary adenopathy or supraclavicular adenopathy.     Left: No mass, tenderness, axillary adenopathy or supraclavicular adenopathy.    Abdominal:     General: Bowel sounds are normal. There is no distension.     Palpations: Abdomen is soft. There is no mass.     Tenderness: There is no abdominal tenderness.  Genitourinary:    Comments: External genitalia, vagina, cervix, adnexa-normal Musculoskeletal:        General: No tenderness. Normal range of motion.     Cervical back: Normal range of motion.  Lymphadenopathy:     Upper Body:     Right upper body: No supraclavicular, axillary or pectoral adenopathy.     Left upper body: No supraclavicular, axillary or pectoral adenopathy.  Skin:    General: Skin is warm and dry.  Neurological:     Mental Status: She is alert and oriented to person, place, and time.     Deep Tendon Reflexes: Reflexes are normal and symmetric.     CMP Latest Ref Rng & Units 06/24/2020 07/03/2019 06/12/2019  Glucose 65 - 99 mg/dL 129(H) 109(H) 81  BUN 6 - 24 mg/dL 5(L) 4(L) 4(L)  Creatinine 0.57 - 1.00 mg/dL 0.67 0.50 0.67  Sodium 134 - 144 mmol/L 140 139 139  Potassium 3.5 - 5.2 mmol/L  3.6 3.8 4.3  Chloride 96 - 106 mmol/L 96 97(L) 96  CO2 20 - 29 mmol/L 25 - 29  Calcium 8.7 - 10.2 mg/dL 9.1 - 9.1  Total Protein 6.0 - 8.5 g/dL 7.4 - 6.8  Total Bilirubin 0.0 - 1.2 mg/dL 0.4 - 0.4  Alkaline Phos 44 - 121 IU/L 209(H) - 171(H)  AST 0 - 40 IU/L 33 - 32  ALT 0 - 32 IU/L 18 - 11    Lipid Panel     Component Value Date/Time   CHOL 186 06/24/2020 1408   TRIG 321 (H) 06/24/2020 1408   HDL 34 (L) 06/24/2020 1408   CHOLHDL 5.5 (H) 06/24/2020 1408   CHOLHDL 4.0 07/19/2016 1238   VLDL 26 07/19/2016 1238   LDLCALC 98 06/24/2020 1408    CBC    Component Value Date/Time   WBC 13.7 (H) 06/12/2019 1057   WBC 18.3 (H) 02/28/2017 2130   RBC 4.17 06/12/2019 1057   RBC 3.79 (L) 02/28/2017 2130   HGB 14.6 07/03/2019 1211   HGB 14.5 06/12/2019 1057   HCT 43.0 07/03/2019 1211   HCT 42.6 06/12/2019 1057   PLT 286 06/12/2019 1057   MCV 102 (H) 06/12/2019 1057   MCH 34.8 (H) 06/12/2019 1057   MCH 35.1 (H) 02/28/2017 2130   MCHC 34.0 06/12/2019 1057   MCHC 35.4 02/28/2017 2130   RDW 13.5 06/12/2019 1057   LYMPHSABS 2.8 06/12/2019 1057   MONOABS 2.1 (H) 12/18/2013 1540   EOSABS 0.2 06/12/2019 1057   BASOSABS 0.1 06/12/2019 1057    Lab Results  Component Value Date   HGBA1C 5.1 (A) 06/12/2019    Assessment & Plan:  1. Annual physical exam Counseled on 150 minutes of exercise per week, healthy eating (including decreased daily intake of saturated fats, cholesterol, added sugars, sodium), routine healthcare maintenance.   2. Screening for cervical cancer - Cytology - PAP([facility])  3. Encounter for screening mammogram for malignant neoplasm of breast - MM 3D SCREEN BREAST BILATERAL; Future  4. Essential hypertension Controlled Counseled on blood pressure goal of less than 130/80, low-sodium, DASH  diet, medication compliance, 150 minutes of moderate intensity exercise per week. Discussed medication compliance, adverse effects. - amLODipine (NORVASC) 5 MG tablet;  Take 1 tablet (5 mg total) by mouth daily.  Dispense: 90 tablet; Refill: 1 - hydrALAZINE (APRESOLINE) 10 MG tablet; Take 1 tablet (10 mg total) by mouth 3 (three) times daily.  Dispense: 90 tablet; Refill: 6  5. Anxiety and depression Stable Continue Cymbalta and BuSpar - busPIRone (BUSPAR) 5 MG tablet; Take 1 tablet (5 mg total) by mouth 2 (two) times daily.  Dispense: 180 tablet; Refill: 1  6. Other polyneuropathy Uncontrolled She is also on Lyrica and nortriptyline - DULoxetine (CYMBALTA) 60 MG capsule; Take 1 capsule (60 mg total) by mouth 2 (two) times daily.  Dispense: 60 capsule; Refill: 6  7. Gastroesophageal reflux disease Stable - omeprazole (PRILOSEC) 20 MG capsule; TAKE 1 CAPSULE BY MOUTH EVERY DAY  Dispense: 30 capsule; Refill: 6  8. Screening for colon cancer - Ambulatory referral to Gastroenterology  9. Screening for STD (sexually transmitted disease) - Cervicovaginal ancillary only    Meds ordered this encounter  Medications  . amLODipine (NORVASC) 5 MG tablet    Sig: Take 1 tablet (5 mg total) by mouth daily.    Dispense:  90 tablet    Refill:  1  . busPIRone (BUSPAR) 5 MG tablet    Sig: Take 1 tablet (5 mg total) by mouth 2 (two) times daily.    Dispense:  180 tablet    Refill:  1  . DULoxetine (CYMBALTA) 60 MG capsule    Sig: Take 1 capsule (60 mg total) by mouth 2 (two) times daily.    Dispense:  60 capsule    Refill:  6  . hydrALAZINE (APRESOLINE) 10 MG tablet    Sig: Take 1 tablet (10 mg total) by mouth 3 (three) times daily.    Dispense:  90 tablet    Refill:  6  . omeprazole (PRILOSEC) 20 MG capsule    Sig: TAKE 1 CAPSULE BY MOUTH EVERY DAY    Dispense:  30 capsule    Refill:  6  . potassium chloride (KLOR-CON M10) 10 MEQ tablet    Sig: Take 1 tablet (10 mEq total) by mouth daily.    Dispense:  90 tablet    Refill:  1    Follow-up: Return in about 6 months (around 06/16/2021) for medical conditions.       Charlott Rakes, MD,  FAAFP. [facility] [place], [place]   12/15/2020, 5:31 PM

## 2020-12-15 NOTE — Patient Instructions (Signed)
Health Maintenance, Female Adopting a healthy lifestyle and getting preventive care are important in promoting health and wellness. Ask your health care provider about:  The right schedule for you to have regular tests and exams.  Things you can do on your own to prevent diseases and keep yourself healthy. What should I know about diet, weight, and exercise? Eat a healthy diet  Eat a diet that includes plenty of vegetables, fruits, low-fat dairy products, and lean protein.  Do not eat a lot of foods that are high in solid fats, added sugars, or sodium.   Maintain a healthy weight Body mass index (BMI) is used to identify weight problems. It estimates body fat based on height and weight. Your health care provider can help determine your BMI and help you achieve or maintain a healthy weight. Get regular exercise Get regular exercise. This is one of the most important things you can do for your health. Most adults should:  Exercise for at least 150 minutes each week. The exercise should increase your heart rate and make you sweat (moderate-intensity exercise).  Do strengthening exercises at least twice a week. This is in addition to the moderate-intensity exercise.  Spend less time sitting. Even light physical activity can be beneficial. Watch cholesterol and blood lipids Have your blood tested for lipids and cholesterol at 50 years of age, then have this test every 5 years. Have your cholesterol levels checked more often if:  Your lipid or cholesterol levels are high.  You are older than 50 years of age.  You are at high risk for heart disease. What should I know about cancer screening? Depending on your health history and family history, you may need to have cancer screening at various ages. This may include screening for:  Breast cancer.  Cervical cancer.  Colorectal cancer.  Skin cancer.  Lung cancer. What should I know about heart disease, diabetes, and high blood  pressure? Blood pressure and heart disease  High blood pressure causes heart disease and increases the risk of stroke. This is more likely to develop in people who have high blood pressure readings, are of African descent, or are overweight.  Have your blood pressure checked: ? Every 3-5 years if you are 18-39 years of age. ? Every year if you are 40 years old or older. Diabetes Have regular diabetes screenings. This checks your fasting blood sugar level. Have the screening done:  Once every three years after age 40 if you are at a normal weight and have a low risk for diabetes.  More often and at a younger age if you are overweight or have a high risk for diabetes. What should I know about preventing infection? Hepatitis B If you have a higher risk for hepatitis B, you should be screened for this virus. Talk with your health care provider to find out if you are at risk for hepatitis B infection. Hepatitis C Testing is recommended for:  Everyone born from 1945 through 1965.  Anyone with known risk factors for hepatitis C. Sexually transmitted infections (STIs)  Get screened for STIs, including gonorrhea and chlamydia, if: ? You are sexually active and are younger than 50 years of age. ? You are older than 50 years of age and your health care provider tells you that you are at risk for this type of infection. ? Your sexual activity has changed since you were last screened, and you are at increased risk for chlamydia or gonorrhea. Ask your health care provider   if you are at risk.  Ask your health care provider about whether you are at high risk for HIV. Your health care provider may recommend a prescription medicine to help prevent HIV infection. If you choose to take medicine to prevent HIV, you should first get tested for HIV. You should then be tested every 3 months for as long as you are taking the medicine. Pregnancy  If you are about to stop having your period (premenopausal) and  you may become pregnant, seek counseling before you get pregnant.  Take 400 to 800 micrograms (mcg) of folic acid every day if you become pregnant.  Ask for birth control (contraception) if you want to prevent pregnancy. Osteoporosis and menopause Osteoporosis is a disease in which the bones lose minerals and strength with aging. This can result in bone fractures. If you are 65 years old or older, or if you are at risk for osteoporosis and fractures, ask your health care provider if you should:  Be screened for bone loss.  Take a calcium or vitamin D supplement to lower your risk of fractures.  Be given hormone replacement therapy (HRT) to treat symptoms of menopause. Follow these instructions at home: Lifestyle  Do not use any products that contain nicotine or tobacco, such as cigarettes, e-cigarettes, and chewing tobacco. If you need help quitting, ask your health care provider.  Do not use street drugs.  Do not share needles.  Ask your health care provider for help if you need support or information about quitting drugs. Alcohol use  Do not drink alcohol if: ? Your health care provider tells you not to drink. ? You are pregnant, may be pregnant, or are planning to become pregnant.  If you drink alcohol: ? Limit how much you use to 0-1 drink a day. ? Limit intake if you are breastfeeding.  Be aware of how much alcohol is in your drink. In the [place] one drink equals one 12 oz bottle of beer (355 mL), one 5 oz glass of wine (148 mL), or one 1 oz glass of hard liquor (44 mL). General instructions  Schedule regular health, dental, and eye exams.  Stay current with your vaccines.  Tell your health care provider if: ? You often feel depressed. ? You have ever been abused or do not feel safe at home. Summary  Adopting a healthy lifestyle and getting preventive care are important in promoting health and wellness.  Follow your health care provider's instructions about healthy  diet, exercising, and getting tested or screened for diseases.  Follow your health care provider's instructions on monitoring your cholesterol and blood pressure. This information is not intended to replace advice given to you by your health care provider. Make sure you discuss any questions you have with your health care provider. Document Revised: 08/22/2018 Document Reviewed: 08/22/2018 Elsevier Patient Education  2021 [employer] Inc.  

## 2020-12-16 LAB — CERVICOVAGINAL ANCILLARY ONLY
Bacterial Vaginitis (gardnerella): POSITIVE — AB
Candida Glabrata: NEGATIVE
Candida Vaginitis: NEGATIVE
Chlamydia: NEGATIVE
Comment: NEGATIVE
Comment: NEGATIVE
Comment: NEGATIVE
Comment: NEGATIVE
Comment: NEGATIVE
Comment: NORMAL
Neisseria Gonorrhea: NEGATIVE
Trichomonas: NEGATIVE

## 2020-12-17 ENCOUNTER — Other Ambulatory Visit: Payer: Self-pay | Admitting: Family Medicine

## 2020-12-17 LAB — CYTOLOGY - PAP
Comment: NEGATIVE
Diagnosis: NEGATIVE
High risk HPV: NEGATIVE

## 2020-12-17 MED ORDER — METRONIDAZOLE 500 MG PO TABS: 500.0000 mg | ORAL_TABLET | Freq: Two times a day (BID) | ORAL | 0 refills | Status: DC

## 2020-12-23 ENCOUNTER — Telehealth: Payer: Self-pay | Admitting: *Deleted

## 2020-12-23 NOTE — Telephone Encounter (Signed)
Unable to reach patient for virtual pre visit. Message left advising I can do visit as long as she calls by 10:10 this am, at 11:30 am today, Or she will need to call and reschedule both colonoscopy with pre visit ( no avail previsits before 01/06/21 colonoscopy.

## 2020-12-26 ENCOUNTER — Encounter (HOSPITAL_COMMUNITY): Payer: Self-pay

## 2020-12-26 ENCOUNTER — Ambulatory Visit (HOSPITAL_COMMUNITY)
Admission: EM | Admit: 2020-12-26 | Discharge: 2020-12-26 | Disposition: A | Discharge status: Home / Self Care | Payer: Medicaid Other | Attending: Emergency Medicine | Admitting: Emergency Medicine

## 2020-12-26 DIAGNOSIS — R0781 Pleurodynia: Secondary | ICD-10-CM

## 2020-12-26 DIAGNOSIS — W19XXXA Unspecified fall, initial encounter: Secondary | ICD-10-CM

## 2020-12-26 MED ORDER — TIZANIDINE HCL 4 MG PO TABS: 4.0000 mg | ORAL_TABLET | Freq: Four times a day (QID) | ORAL | 0 refills | Status: DC | PRN

## 2020-12-26 MED ORDER — NAPROXEN 500 MG PO TABS: 500.0000 mg | ORAL_TABLET | Freq: Two times a day (BID) | ORAL | 0 refills | Status: DC

## 2020-12-26 NOTE — ED Triage Notes (Signed)
Pt presents with back pain after a fall in the shower this morning.

## 2020-12-26 NOTE — ED Provider Notes (Signed)
[place]    CSN: [id number] Arrival date & time: 12/26/20  1736      History   Chief Complaint Chief Complaint  Patient presents with  . Fall    HPI [name] is a 50 y.o. female.   Patient here for evaluation of left side and rib pain following fall in shower this morning.  Reports pain worse with movement.  Has not taken any OTC medications or treatments.  Denies hitting head, blurred vision, or extremity pain or weakness. Denies any fevers, chest pain, shortness of breath, N/V/D, numbness, tingling, weakness, abdominal pain, or headaches.   ROS: As per HPI, all other pertinent ROS negative    The history is provided by the patient.  Fall    Past Medical History:  Diagnosis Date  . Arthritis    right foot  . GERD (gastroesophageal reflux disease)   . History of alcoholism ([place])   . History of cellulitis    2015  right side face  . Hypertension    followed by pcp  (per pt never had stress test)  . Malnutrition ([place])   . Metatarsal fracture    right 2nd  . Peripheral neuropathy    feet  . Strachan's syndrome    painful neuropathy  . Wears dentures    upper  . Wears glasses     Patient Active Problem List   Diagnosis Date Noted  . Tobacco abuse 03/19/2019  . Vitamin D deficiency 10/25/2018  . Closed fracture of bone of right foot 07/25/2018  . Insomnia 10/02/2017  . Ingrown toenail 04/05/2017  . Closed nondisplaced fracture of proximal phalanx of lesser toe of right foot 04/05/2017  . 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 Peripheral neuropathy 12/24/2013  . Lower extremity pain, bilateral 12/18/2013    Past Surgical History:  Procedure Laterality Date  . MULTIPLE EXTRACTIONS WITH ALVEOLOPLASTY N/A 12/25/2013   Procedure: Extraction of tooth #'s 2,3,4,5,6,8,9,10,11,12,13,14,15,20,21,22,23,24,25,26,27, [phone] with alveoloplasty and bilateral mandibular tori reductions;  Surgeon: Lenn Cal, DDS;   Location: [facility];  Service: Oral Surgery;  Laterality: N/A;  . ORIF TOE FRACTURE Right 07/03/2019   Procedure: OPEN REDUCTION INTERNAL FIXATION (ORIF) METATARSAL (TOE) FRACTURE;  Surgeon: Trula Slade, [name];  Location: [facility];  Service: Podiatry;  Laterality: Right;  LEG BLOCK  . SURAL NERVE BX Right 12/24/2013   Procedure: SURAL NERVE BIOPSY;  Surgeon: Kristeen Miss, MD;  Location: [facility];  Service: Neurosurgery;  Laterality: Right;  . TUBAL LIGATION Bilateral 2002   PPTL    OB History   No obstetric history on file.      Home Medications    Prior to Admission medications   Medication Sig Start Date End Date Taking? Authorizing Provider  naproxen (NAPROSYN) 500 MG tablet Take 1 tablet (500 mg total) by mouth 2 (two) times daily. 12/26/20  Yes Pearson Forster, NP  tiZANidine (ZANAFLEX) 4 MG tablet Take 1 tablet (4 mg total) by mouth every 6 (six) hours as needed for muscle spasms. 12/26/20  Yes Pearson Forster, NP  amLODipine (NORVASC) 5 MG tablet Take 1 tablet (5 mg total) by mouth daily. 12/15/20   Charlott Rakes, MD  benzonatate (TESSALON) 100 MG capsule Take 1 capsule (100 mg total) by mouth every 8 (eight) hours. Patient not taking: No sig reported 04/01/20   Augusto Gamble B, NP  busPIRone (BUSPAR) 5 MG tablet Take 1 tablet (5 mg total) by mouth 2 (two)  times daily. 12/15/20   Charlott Rakes, MD  DULoxetine (CYMBALTA) 60 MG capsule Take 1 capsule (60 mg total) by mouth 2 (two) times daily. 12/15/20   Charlott Rakes, MD  hydrALAZINE (APRESOLINE) 10 MG tablet Take 1 tablet (10 mg total) by mouth 3 (three) times daily. 12/15/20   Charlott Rakes, MD  HYDROcodone-acetaminophen (NORCO/VICODIN) 5-325 MG tablet Take 1 tablet by mouth every 8 (eight) hours as needed. Patient not taking: No sig reported 12/24/19   Trula Slade, [name]  metroNIDAZOLE (FLAGYL) 500 MG tablet Take 1 tablet (500 mg total) by mouth 2 (two) times daily. 12/17/20   Charlott Rakes, MD  Multiple  Vitamins-Minerals (MULTIVITAMIN WITH MINERALS) tablet Take 1 tablet by mouth daily.    [provider]  nortriptyline (PAMELOR) 50 MG capsule Take 1 capsule (50 mg total) by mouth at bedtime. 12/02/20   Patel, Arvin Collard K, DO  omeprazole (PRILOSEC) 20 MG capsule TAKE 1 CAPSULE BY MOUTH EVERY DAY 12/15/20   Charlott Rakes, MD  potassium chloride (KLOR-CON M10) 10 MEQ tablet Take 1 tablet (10 mEq total) by mouth daily. 12/15/20   Charlott Rakes, MD  pregabalin (LYRICA) 150 MG capsule TAKE 1 CAPSULE BY MOUTH THREE TIMES A DAY 08/19/20   Patel, Donika K, DO  pregabalin (LYRICA) 150 MG capsule Take 1 capsule (150 mg total) by mouth 2 (two) times daily. 10/30/20   Narda Amber K, DO  Vitamin D, Ergocalciferol, (DRISDOL) 1.25 MG (50000 UNIT) CAPS capsule TAKE 1 CAPSULE (50,000 UNITS TOTAL) BY MOUTH EVERY 7 (SEVEN) DAYS. PLEASE MAKE PCP APPOINTMENT. 11/21/19   Charlott Rakes, MD    Family History Family History  Adopted: Yes  Problem Relation Age of Onset  . Diabetes Father   . Hypertension Mother   . Raynaud syndrome Mother   . Diabetes Maternal Aunt   . Heart disease Maternal Aunt   . Diabetes Maternal Uncle   . Heart disease Maternal Uncle   . Drug abuse Maternal Grandmother   . Cancer Maternal Grandmother        intestinal  . Hypertension Maternal Grandmother   . Cancer Maternal Grandfather        lung  . Hypertension Paternal Grandfather     Social History Social History   Tobacco Use  . Smoking status: Current Every Day Smoker    Packs/day: 0.50    Years: 20.00    Pack years: 10.00    Types: Cigarettes  . Smokeless tobacco: Never Used  Vaping Use  . Vaping Use: Never used  Substance Use Topics  . Alcohol use: Yes    Comment: 07-02-2019-- per pt 4 drinks per day  . Drug use: Not Currently    Comment: 07-02-2019  -- per pt last used in age 86s     Allergies   Macrobid [nitrofurantoin macrocrystal]   Review of Systems Review of Systems  Musculoskeletal: Positive  for back pain.  All other systems reviewed and are negative.    Physical Exam Triage Vital Signs ED Triage Vitals  Enc Vitals Group     BP 12/26/20 1821 127/77     Pulse Rate 12/26/20 1821 80     Resp 12/26/20 1821 20     Temp 12/26/20 1821 98.7 F (37.1 C)     Temp Source 12/26/20 1821 Oral     SpO2 12/26/20 1821 96 %     Weight --      Height --      Head Circumference --  Peak Flow --      Pain Score 12/26/20 1822 9     Pain Loc --      Pain Edu? --      Excl. in [place]? --    No data found.  Updated Vital Signs BP 127/77 (BP Location: Right Arm)   Pulse 80   Temp 98.7 F (37.1 C) (Oral)   Resp 20   SpO2 96%   Visual Acuity Right Eye Distance:   Left Eye Distance:   Bilateral Distance:    Right Eye Near:   Left Eye Near:    Bilateral Near:     Physical Exam Vitals and nursing note reviewed.  Constitutional:      General: She is not in acute distress.    Appearance: Normal appearance. She is not ill-appearing, toxic-appearing or diaphoretic.  HENT:     Head: Normocephalic and atraumatic.  Eyes:     Conjunctiva/sclera: Conjunctivae normal.  Cardiovascular:     Rate and Rhythm: Normal rate.     Pulses: Normal pulses.     Heart sounds: Normal heart sounds.  Pulmonary:     Effort: Pulmonary effort is normal.     Breath sounds: Normal breath sounds.  Abdominal:     General: Abdomen is flat.  Musculoskeletal:        General: Normal range of motion.     Cervical back: Normal and normal range of motion.     Thoracic back: Normal.     Lumbar back: Normal.     Comments: Left rib and side pain to palpation, no obvious bruising, no crepitus  Skin:    General: Skin is warm and dry.  Neurological:     General: No focal deficit present.     Mental Status: She is alert and oriented to person, place, and time.     GCS: GCS eye subscore is 4. GCS verbal subscore is 5. GCS motor subscore is 6.     Motor: Motor function is intact.     Coordination:  Coordination is intact.     Gait: Gait is intact.  Psychiatric:        Mood and Affect: Mood normal.      UC Treatments / Results  Labs (all labs ordered are listed, but only abnormal results are displayed) Labs Reviewed - No data to display  EKG   Radiology No results found.  Procedures Procedures (including critical care time)  Medications Ordered in UC Medications - No data to display  Initial Impression / Assessment and Plan / UC Course  I have reviewed the triage vital signs and the nursing notes.  Pertinent labs & imaging results that were available during my care of the patient were reviewed by me and considered in my medical decision making (see chart for details).     Assessment negative for red flags or concerns.  Naproxen twice daily as needed for pain and Zanaflex as needed for muscle pain and spasms.  Recommend alternating heat and ice for comfort.  Recommend resting as much as possible.  Discussed cough and deep breathing exercises and may brace ribs for comfort.  Follow-up with primary care if symptoms do not improve in the next few days.  Final Clinical Impressions(s) / UC Diagnoses   Final diagnoses:  Rib pain  Fall, initial encounter     Discharge Instructions     Take the naproxen twice a day as needed for pain.  Take the Zanaflex as needed for muscle pain and  spasms.  You can alternate the use of heat and ice for comfort.  Rest as much as possible.  Take deep breaths or do cough and deep breathing exercises several times a day.  Follow-up with your primary care provider if symptoms do not improve in the next few days.    ED Prescriptions    Medication Sig Dispense Auth. Provider   naproxen (NAPROSYN) 500 MG tablet Take 1 tablet (500 mg total) by mouth 2 (two) times daily. 30 tablet Pearson Forster, NP   tiZANidine (ZANAFLEX) 4 MG tablet Take 1 tablet (4 mg total) by mouth every 6 (six) hours as needed for muscle spasms. 30 tablet Pearson Forster, NP     PDMP not reviewed this encounter.   Pearson Forster, NP 12/26/20 1850

## 2020-12-26 NOTE — Discharge Instructions (Signed)
Take the naproxen twice a day as needed for pain.  Take the Zanaflex as needed for muscle pain and spasms.  You can alternate the use of heat and ice for comfort.  Rest as much as possible.  Take deep breaths or do cough and deep breathing exercises several times a day.  Follow-up with your primary care provider if symptoms do not improve in the next few days.

## 2021-01-06 ENCOUNTER — Encounter: Payer: Medicaid Other | Admitting: Gastroenterology

## 2021-02-03 ENCOUNTER — Other Ambulatory Visit: Payer: Self-pay

## 2021-02-03 ENCOUNTER — Ambulatory Visit (AMBULATORY_SURGERY_CENTER): Payer: Medicaid Other

## 2021-02-03 VITALS — Ht 65.0 in | Wt 139.0 lb

## 2021-02-03 DIAGNOSIS — Z1211 Encounter for screening for malignant neoplasm of colon: Secondary | ICD-10-CM

## 2021-02-03 MED ORDER — NA SULFATE-K SULFATE-MG SULF 17.5-3.13-1.6 GM/177ML PO SOLN: 1.0000 | Freq: Once | ORAL | 0 refills | Status: AC

## 2021-02-03 NOTE — Progress Notes (Signed)
Pt verified name, DOB, address and insurance during PV today.   Pt mailed instruction packet to include copy of consent form to read and not return, and instructions. PV completed over the phone. Pt encouraged to call with questions or issues.   No allergies to soy or egg Pt is not on blood thinners or diet pills Denies issues with sedation/intubation Denies atrial flutter/fib Denies constipation   Emmi instructions given to pt  Pt is aware of Covid safety and care partner requirements.  

## 2021-02-11 ENCOUNTER — Other Ambulatory Visit: Payer: Self-pay

## 2021-02-11 NOTE — Telephone Encounter (Signed)
[facility] on Nortriptyline 50mg .

## 2021-02-15 ENCOUNTER — Other Ambulatory Visit: Payer: Self-pay | Admitting: *Deleted

## 2021-02-17 ENCOUNTER — Ambulatory Visit (AMBULATORY_SURGERY_CENTER): Payer: Medicaid Other | Admitting: Gastroenterology

## 2021-02-17 ENCOUNTER — Encounter: Payer: Self-pay | Admitting: Gastroenterology

## 2021-02-17 ENCOUNTER — Other Ambulatory Visit: Payer: Self-pay

## 2021-02-17 VITALS — BP 132/84 | HR 77 | Temp 97.0°F | Resp 11 | Ht 65.0 in | Wt 139.0 lb

## 2021-02-17 DIAGNOSIS — Z1211 Encounter for screening for malignant neoplasm of colon: Secondary | ICD-10-CM | POA: Diagnosis not present

## 2021-02-17 DIAGNOSIS — D120 Benign neoplasm of cecum: Secondary | ICD-10-CM

## 2021-02-17 DIAGNOSIS — D125 Benign neoplasm of sigmoid colon: Secondary | ICD-10-CM | POA: Diagnosis not present

## 2021-02-17 MED ORDER — SODIUM CHLORIDE 0.9 % IV SOLN: 500.0000 mL | Freq: Once | INTRAVENOUS | Status: DC

## 2021-02-17 NOTE — Op Note (Signed)
[facility] Patient Name: [name] Procedure Date: 02/17/2021 2:04 PM MRN: [id number] Endoscopist: Mallie Mussel L. Loletha Carrow , MD Age: 50 Referring MD:  Date of Birth: Sep 02, 1971 Gender: Female Account #: [id number] Procedure:                Colonoscopy Indications:              Screening for colorectal malignant neoplasm, This                            is the patient's first colonoscopy Medicines:                Monitored Anesthesia Care Procedure:                Pre-Anesthesia Assessment:                           - Prior to the procedure, a History and Physical                            was performed, and patient medications and                            allergies were reviewed. The patient's tolerance of                            previous anesthesia was also reviewed. The risks                            and benefits of the procedure and the sedation                            options and risks were discussed with the patient.                            All questions were answered, and informed consent                            was obtained. Prior Anticoagulants: The patient has                            taken no previous anticoagulant or antiplatelet                            agents. ASA Grade Assessment: II - A patient with                            mild systemic disease. After reviewing the risks                            and benefits, the patient was deemed in                            satisfactory condition to undergo the procedure.  After obtaining informed consent, the colonoscope                            was passed under direct vision. Throughout the                            procedure, the patient's blood pressure, pulse, and                            oxygen saturations were monitored continuously. The                            Olympus CF-HQ190L ([id number]) Colonoscope was                            introduced through the anus and  advanced to the the                            cecum, identified by appendiceal orifice and                            ileocecal valve. The colonoscopy was somewhat                            difficult due to a tortuous sigmoid colon. The                            patient tolerated the procedure well. The quality                            of the bowel preparation was good. The ileocecal                            valve, appendiceal orifice, and rectum were                            photographed. Scope In: 2:11:33 PM Scope Out: 2:29:55 PM Scope Withdrawal Time: 0 hours 15 minutes 28 seconds  Total Procedure Duration: 0 hours 18 minutes 22 seconds  Findings:                 The perianal and digital rectal examinations were                            normal.                           Two sessile polyps were found in the mid sigmoid                            colon and cecum. The polyps were 4 to 8 mm in size.                            These polyps were removed with a cold snare.  Resection and retrieval were complete.                           A 12 mm polyp was found in the distal sigmoid                            colon. The polyp was pedunculated. The polyp was                            removed with a hot snare. Resection and retrieval                            were complete.                           Retroflexion in the rectum was not performed due to                            anatomy.                           The exam was otherwise without abnormality. Complications:            No immediate complications. Estimated Blood Loss:     Estimated blood loss was minimal. Impression:               - Two 4 to 8 mm polyps in the mid sigmoid colon and                            in the cecum, removed with a cold snare. Resected                            and retrieved.                           - One 12 mm polyp in the distal sigmoid colon,                             removed with a hot snare. Resected and retrieved.                           - The examination was otherwise normal. Recommendation:           - Patient has a contact number available for                            emergencies. The signs and symptoms of potential                            delayed complications were discussed with the                            patient. Return to normal activities tomorrow.  Written discharge instructions were provided to the                            patient.                           - Resume previous diet.                           - Continue present medications.                           - Await pathology results.                           - Repeat colonoscopy is recommended for                            surveillance. The colonoscopy date will be                            determined after pathology results from today's                            exam become available for review. Soila Printup L. Loletha Carrow, MD 02/17/2021 2:38:02 PM This report has been signed electronically.

## 2021-02-17 NOTE — Progress Notes (Signed)
A/ox3, pleased with MAC, report to RN 

## 2021-02-17 NOTE — Progress Notes (Signed)
Called to room to assist during endoscopic procedure.  Patient ID and intended procedure confirmed with present staff. Received instructions for my participation in the procedure from the performing physician.  

## 2021-02-17 NOTE — Patient Instructions (Signed)
Please read handouts provided. Continue present medications. Await pathology results.   YOU HAD AN ENDOSCOPIC PROCEDURE TODAY AT THE [facility]:   Refer to the procedure report that was given to you for any specific questions about what was found during the examination.  If the procedure report does not answer your questions, please call your gastroenterologist to clarify.  If you requested that your care partner not be given the details of your procedure findings, then the procedure report has been included in a sealed envelope for you to review at your convenience later.  YOU SHOULD EXPECT: Some feelings of bloating in the abdomen. Passage of more gas than usual.  Walking can help get rid of the air that was put into your GI tract during the procedure and reduce the bloating. If you had a lower endoscopy (such as a colonoscopy or flexible sigmoidoscopy) you may notice spotting of blood in your stool or on the toilet paper. If you underwent a bowel prep for your procedure, you may not have a normal bowel movement for a few days.  Please Note:  You might notice some irritation and congestion in your nose or some drainage.  This is from the oxygen used during your procedure.  There is no need for concern and it should clear up in a day or so.  SYMPTOMS TO REPORT IMMEDIATELY:  Following lower endoscopy (colonoscopy or flexible sigmoidoscopy):  Excessive amounts of blood in the stool  Significant tenderness or worsening of abdominal pains  Swelling of the abdomen that is new, acute  Fever of 100F or higher   For urgent or emergent issues, a gastroenterologist can be reached at any hour by calling [phone]. Do not use MyChart messaging for urgent concerns.    DIET:  We do recommend a small meal at first, but then you may proceed to your regular diet.  Drink plenty of fluids but you should avoid alcoholic beverages for 24 hours.  ACTIVITY:  You should plan to take it easy  for the rest of today and you should NOT DRIVE or use heavy machinery until tomorrow (because of the sedation medicines used during the test).    FOLLOW UP: Our staff will call the number listed on your records 48-72 hours following your procedure to check on you and address any questions or concerns that you may have regarding the information given to you following your procedure. If we do not reach you, we will leave a message.  We will attempt to reach you two times.  During this call, we will ask if you have developed any symptoms of COVID 19. If you develop any symptoms (ie: fever, flu-like symptoms, shortness of breath, cough etc.) before then, please call [phone].  If you test positive for Covid 19 in the 2 weeks post procedure, please call and report this information to us.    If any biopsies were taken you will be contacted by phone or by letter within the next 1-3 weeks.  Please call us at [phone] if you have not heard about the biopsies in 3 weeks.    SIGNATURES/CONFIDENTIALITY: You and/or your care partner have signed paperwork which will be entered into your electronic medical record.  These signatures attest to the fact that that the information above on your After Visit Summary has been reviewed and is understood.  Full responsibility of the confidentiality of this discharge information lies with you and/or your care-partner.  

## 2021-02-17 NOTE — Progress Notes (Addendum)
Pt's states no medical or surgical changes since previsit or office visit.  VS taken by [facility]

## 2021-02-19 ENCOUNTER — Telehealth: Payer: Self-pay

## 2021-02-19 NOTE — Telephone Encounter (Signed)
Attempted to reach pt. With follow-up call following endoscopic procedure 02/17/2021.  LM on pt. Voice mail to call if she has any questions or concerns.

## 2021-02-19 NOTE — Telephone Encounter (Signed)
No answer, left message to call back later today, B.Eastin Swing RN. 

## 2021-02-23 ENCOUNTER — Encounter: Payer: Self-pay | Admitting: Gastroenterology

## 2021-02-24 ENCOUNTER — Telehealth: Payer: Self-pay | Admitting: Neurology

## 2021-02-24 NOTE — Telephone Encounter (Signed)
Patient scheduled 03/08/21 at 3:30PM with Dr. Posey Pronto.

## 2021-02-24 NOTE — Telephone Encounter (Signed)
Called patient and informed [facility] that [facility] Lyrica on [facility]. Called patient patient and informed her that she still has 2 refills and to contact her pharmacy to refill when ready. Patient verbalized understanding and had no further questions or concerns.

## 2021-02-24 NOTE — Telephone Encounter (Signed)
Patient should still have two refills left on her last Lyrica 150mg  BID prescription which was sent in October 30, 2020. She needs to contact her pharmacy.

## 2021-03-08 ENCOUNTER — Telehealth (INDEPENDENT_AMBULATORY_CARE_PROVIDER_SITE_OTHER): Payer: Medicaid Other | Admitting: Neurology

## 2021-03-08 ENCOUNTER — Other Ambulatory Visit: Payer: Self-pay

## 2021-03-08 DIAGNOSIS — G621 Alcoholic polyneuropathy: Secondary | ICD-10-CM

## 2021-03-08 DIAGNOSIS — E5111 Dry beriberi: Secondary | ICD-10-CM

## 2021-03-08 DIAGNOSIS — G6289 Other specified polyneuropathies: Secondary | ICD-10-CM

## 2021-03-08 NOTE — Progress Notes (Signed)
   Virtual Visit via Video Note The purpose of this virtual visit is to provide medical care while limiting exposure to the novel coronavirus.    Consent was obtained for video visit:  Yes.   Answered questions that patient had about telehealth interaction:  Yes.   I discussed the limitations, risks, security and privacy concerns of performing an evaluation and management service by telemedicine. I also discussed with the patient that there may be a patient responsible charge related to this service. The patient expressed understanding and agreed to proceed.  Pt location: Home Physician Location: office Name of referring provider:  Charlott Rakes, MD I connected with [name] at patients initiation/request on 03/08/2021 at  3:30 PM EDT by video enabled telemedicine application and verified that I am speaking with the correct person using two identifiers. Pt MRN:  [id number] Pt DOB:  December 19, 1970 Video Participants:  [name]   History of Present Illness: This is a 50 y.o. 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 Her neuropathic pain is better controlled on nortriptyline 50mg  qhs and Lyrica 150mg .  She also takes Cymbalta 60mg  BID (prescribed by PCP).  She does endorse mild dry mouth. She has returned to work at the [employer] and works 50hr/week every other week.  She has reduced alcohol and is taking supplements.  She suffered one fall a few months ago in the shower and fractured her ribs.  She still has some chest wall pain attributed to this.   Observations/Objective:   There were no vitals filed for this visit. Patient is awake, alert, and appears comfortable.  Oriented x 4.   Extraocular muscles are intact. No ptosis.  Face is symmetric.  Speech is not dysarthric.  Antigravity in all extremities.  No pronator drift. Gait appears normal, unassisted, stable.   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  Will refill next month. - She also takes Cymbalta 60mg  BID prescribed by PCP  2. Nutritional deficiency  - Continue vitamin B12 1080mcg daily, vitamin B1 100mg  and folic acid 1mg  daily  - Encouraged alcohol cessation  Follow Up Instructions:   I discussed the assessment and treatment plan with the patient. The patient was provided an opportunity to ask questions and all were answered. The patient agreed with the plan and demonstrated an understanding of the instructions.   The patient was advised to call back or seek an in-person evaluation if the symptoms worsen or if the condition fails to improve as anticipated.  Follow-up in 6 months   Alda Berthold, DO

## 2021-03-17 ENCOUNTER — Other Ambulatory Visit: Payer: Self-pay | Admitting: Family Medicine

## 2021-03-17 DIAGNOSIS — G6289 Other specified polyneuropathies: Secondary | ICD-10-CM

## 2021-03-17 NOTE — Telephone Encounter (Signed)
  Notes to clinic:  REQUEST FOR 90 DAYS PRESCRIPTION. DX Code Needed.   Requested Prescriptions  Pending Prescriptions Disp Refills   DULoxetine (CYMBALTA) 60 MG capsule [Pharmacy Med Name: DULOXETINE HCL DR 60 MG CAP] 180 capsule 3    Sig: TAKE 1 CAPSULE BY MOUTH 2 TIMES DAILY.      Psychiatry: Antidepressants - SNRI Passed - 03/17/2021  1:30 PM      Passed - Last BP in normal range    BP Readings from Last 1 Encounters:  02/17/21 132/84          Passed - Valid encounter within last 6 months    Recent Outpatient Visits           3 months ago Annual physical exam   [place], Enobong, MD   9 months ago Essential hypertension   [place], Enobong, MD   1 year ago Suspected COVID-19 virus infection   [place], Enobong, MD   1 year ago Screening for diabetes mellitus   [place], Enobong, MD   1 year ago Neck mass   [facility] Charlott Rakes, MD

## 2021-03-30 ENCOUNTER — Inpatient Hospital Stay: Admission: RE | Admit: 2021-03-30 | Payer: Medicaid Other | Source: Ambulatory Visit

## 2021-04-23 ENCOUNTER — Other Ambulatory Visit: Payer: Self-pay | Admitting: Neurology

## 2021-04-29 ENCOUNTER — Other Ambulatory Visit: Payer: Self-pay

## 2021-04-29 MED ORDER — PREGABALIN 150 MG PO CAPS: 150.0000 mg | ORAL_CAPSULE | Freq: Two times a day (BID) | ORAL | 5 refills | Status: DC

## 2021-06-02 ENCOUNTER — Other Ambulatory Visit: Payer: Self-pay | Admitting: Family Medicine

## 2021-06-02 DIAGNOSIS — K219 Gastro-esophageal reflux disease without esophagitis: Secondary | ICD-10-CM

## 2021-06-23 DIAGNOSIS — H5213 Myopia, bilateral: Secondary | ICD-10-CM | POA: Diagnosis not present

## 2021-06-24 ENCOUNTER — Other Ambulatory Visit: Payer: Self-pay | Admitting: Family Medicine

## 2021-06-24 DIAGNOSIS — G6289 Other specified polyneuropathies: Secondary | ICD-10-CM

## 2021-06-25 NOTE — Telephone Encounter (Signed)
Requested medication (s) are due for refill today: yes  Requested medication (s) are on the active medication list: yes  Last refill:  03/18/21 #180  Future visit scheduled: yes  Notes to clinic:  pt did not have return visit in October   Requested Prescriptions  Pending Prescriptions Disp Refills   DULoxetine (CYMBALTA) 60 MG capsule [Pharmacy Med Name: DULOXETINE HCL DR 60 MG CAP] 180 capsule 0    Sig: TAKE 1 CAPSULE BY MOUTH TWICE A DAY     Psychiatry: Antidepressants - SNRI Failed - 06/24/2021  7:50 PM      Failed - Valid encounter within last 6 months    Recent Outpatient Visits           6 months ago Annual physical exam   [facility] Charlott Rakes, MD   1 year ago Essential hypertension   [place], Charlane Ferretti, MD   1 year ago Suspected COVID-19 virus infection   [place], Charlane Ferretti, MD   2 years ago Screening for diabetes mellitus   [place], Enobong, MD   2 years ago Neck mass   [place], Enobong, MD       Future Appointments             In 2 months Charlott Rakes, MD [place] BP in normal range    BP Readings from Last 1 Encounters:  02/17/21 132/84

## 2021-06-25 NOTE — Telephone Encounter (Signed)
Requested medication (s) are due for refill today: yes  Requested medication (s) are on the active medication list: yes  Last refill:  03/18/21 #180  Future visit scheduled: yes  Notes to clinic:  pt does not have a October appt- but has one in December- was going to refill with enough med to last to December appt. Which is 62 days away. Sending to office to review.   Requested Prescriptions  Pending Prescriptions Disp Refills   DULoxetine (CYMBALTA) 60 MG capsule [Pharmacy Med Name: DULOXETINE HCL DR 60 MG CAP] 180 capsule 0    Sig: TAKE 1 CAPSULE BY MOUTH TWICE A DAY     Psychiatry: Antidepressants - SNRI Failed - 06/24/2021  7:50 PM      Failed - Valid encounter within last 6 months    Recent Outpatient Visits           6 months ago Annual physical exam   [place], Enobong, MD   1 year ago Essential hypertension   [place], Charlane Ferretti, MD   1 year ago Suspected COVID-19 virus infection   [place], Charlane Ferretti, MD   2 years ago Screening for diabetes mellitus   [place] Charlott Rakes, MD   2 years ago Neck mass   [place], Enobong, MD       Future Appointments             In 2 months Charlott Rakes, MD [place] BP in normal range    BP Readings from Last 1 Encounters:  02/17/21 132/84

## 2021-07-27 ENCOUNTER — Other Ambulatory Visit: Payer: Self-pay | Admitting: Family Medicine

## 2021-07-27 DIAGNOSIS — F419 Anxiety disorder, unspecified: Secondary | ICD-10-CM

## 2021-07-27 DIAGNOSIS — F32A Depression, unspecified: Secondary | ICD-10-CM

## 2021-07-27 NOTE — Telephone Encounter (Signed)
Requested Prescriptions  Pending Prescriptions Disp Refills  . busPIRone (BUSPAR) 5 MG tablet [Pharmacy Med Name: BUSPIRONE HCL 5 MG TABLET] 60 tablet 0    Sig: TAKE 1 TABLET BY MOUTH TWICE A DAY     Psychiatry: Anxiolytics/Hypnotics - Non-controlled Failed - 07/27/2021 12:06 AM      Failed - Valid encounter within last 6 months    Recent Outpatient Visits          7 months ago Annual physical exam   [place], Enobong, MD   1 year ago Essential hypertension   [place], Charlane Ferretti, MD   1 year ago Suspected COVID-19 virus infection   [place], Charlane Ferretti, MD   2 years ago Screening for diabetes mellitus   [place], Charlane Ferretti, MD   2 years ago Neck mass   [place], Enobong, MD      Future Appointments            In 1 month Charlott Rakes, MD [place]

## 2021-08-26 ENCOUNTER — Other Ambulatory Visit: Payer: Self-pay

## 2021-08-26 ENCOUNTER — Encounter: Payer: Self-pay | Admitting: Family Medicine

## 2021-08-26 ENCOUNTER — Ambulatory Visit: Payer: Medicaid Other | Attending: Family Medicine | Admitting: Family Medicine

## 2021-08-26 VITALS — BP 137/80 | HR 80 | Ht 65.0 in | Wt 137.0 lb

## 2021-08-26 DIAGNOSIS — K219 Gastro-esophageal reflux disease without esophagitis: Secondary | ICD-10-CM

## 2021-08-26 DIAGNOSIS — F32A Depression, unspecified: Secondary | ICD-10-CM | POA: Diagnosis not present

## 2021-08-26 DIAGNOSIS — F419 Anxiety disorder, unspecified: Secondary | ICD-10-CM | POA: Diagnosis not present

## 2021-08-26 DIAGNOSIS — Z23 Encounter for immunization: Secondary | ICD-10-CM | POA: Diagnosis not present

## 2021-08-26 DIAGNOSIS — R1902 Left upper quadrant abdominal swelling, mass and lump: Secondary | ICD-10-CM

## 2021-08-26 DIAGNOSIS — I10 Essential (primary) hypertension: Secondary | ICD-10-CM

## 2021-08-26 DIAGNOSIS — G6289 Other specified polyneuropathies: Secondary | ICD-10-CM | POA: Diagnosis not present

## 2021-08-26 DIAGNOSIS — Z1231 Encounter for screening mammogram for malignant neoplasm of breast: Secondary | ICD-10-CM

## 2021-08-26 MED ORDER — AMLODIPINE BESYLATE 5 MG PO TABS
5.0000 mg | ORAL_TABLET | Freq: Every day | ORAL | 1 refills | Status: DC
Start: 2021-08-26 — End: 2022-02-16

## 2021-08-26 MED ORDER — OMEPRAZOLE 20 MG PO CPDR
20.0000 mg | DELAYED_RELEASE_CAPSULE | Freq: Every day | ORAL | 1 refills | Status: DC
Start: 2021-08-26 — End: 2022-02-16

## 2021-08-26 MED ORDER — CLONIDINE HCL 0.1 MG PO TABS: 0.1000 mg | ORAL_TABLET | Freq: Every day | ORAL | 3 refills | Status: DC

## 2021-08-26 MED ORDER — BUSPIRONE HCL 5 MG PO TABS: 5.0000 mg | ORAL_TABLET | Freq: Two times a day (BID) | ORAL | 1 refills | Status: DC

## 2021-08-26 MED ORDER — DULOXETINE HCL 60 MG PO CPEP: 60.0000 mg | ORAL_CAPSULE | Freq: Two times a day (BID) | ORAL | 1 refills | Status: DC

## 2021-08-26 MED ORDER — POTASSIUM CHLORIDE CRYS ER 10 MEQ PO TBCR: 10.0000 meq | EXTENDED_RELEASE_TABLET | Freq: Every day | ORAL | 1 refills | Status: DC

## 2021-08-26 NOTE — Progress Notes (Signed)
Has not on left side from old fall.

## 2021-08-26 NOTE — Progress Notes (Signed)
Subjective:  Patient ID: [name], female    DOB: 01-Dec-1970  Age: 50 y.o. MRN: [id number]  CC: Hypertension   HPI [name] is a 50 y.o. 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  Swelling was noticed at the time of the fall but she thought it was just due to a bruise.  She has no nausea, vomiting. She complains of hot flashes which prevents her from sleeping mainly occurring at night but she has none during the day.  Symptoms are present almost every night  Hypertension is controlled on her current antihypertensive and neuropathy is controlled on combination of Lyrica, Cymbalta, nortriptyline. Doing well on her PPI for management of her GERD.  Her anxiety and depression are controlled on BuSpar and Cymbalta. Past Medical History:  Diagnosis Date   Anxiety    Arthritis    right foot   GERD (gastroesophageal reflux disease)    History of alcoholism ([place])    History of cellulitis    2015  right side face   Hypertension    followed by pcp  (per pt never had stress test)   Malnutrition ([place])    Metatarsal fracture    right 2nd   Peripheral neuropathy    feet   Strachan's syndrome    painful neuropathy   Wears dentures    upper   Wears glasses     Past Surgical History:  Procedure Laterality Date   MULTIPLE EXTRACTIONS WITH ALVEOLOPLASTY N/A 12/25/2013   Procedure: Extraction of tooth #'s 2,3,4,5,6,8,9,10,11,12,13,14,15,20,21,22,23,24,25,26,27, 20,94,70,96 with alveoloplasty and bilateral mandibular tori reductions;  Surgeon: Lenn Cal, DDS;  Location: [place];  Service: Oral Surgery;  Laterality: N/A;   ORIF TOE FRACTURE Right 07/03/2019   Procedure: OPEN REDUCTION INTERNAL FIXATION  (ORIF) METATARSAL (TOE) FRACTURE;  Surgeon: Trula Slade, [name];  Location: [place];  Service: Podiatry;  Laterality: Right;  LEG BLOCK   SURAL NERVE BX Right 12/24/2013   Procedure: SURAL NERVE BIOPSY;  Surgeon: Kristeen Miss, MD;  Location: [place] [facility];  Service: Neurosurgery;  Laterality: Right;   TUBAL LIGATION Bilateral 2002   PPTL    Family History  Adopted: Yes  Problem Relation Age of Onset   Diabetes Father    Hypertension Mother    Raynaud syndrome Mother    Diabetes Maternal Aunt    Heart disease Maternal Aunt    Diabetes Maternal Uncle    Heart disease Maternal Uncle    Drug abuse Maternal Grandmother    Cancer Maternal Grandmother        intestinal   Hypertension Maternal Grandmother    Cancer Maternal Grandfather        lung   Hypertension Paternal Grandfather    Colon cancer Neg Hx    Colon polyps Neg Hx    Esophageal cancer Neg Hx    Rectal cancer Neg Hx    Stomach cancer Neg Hx     Allergies  Allergen Reactions   Macrobid [Nitrofurantoin Macrocrystal] Anaphylaxis and Other (See Comments)    Patient reports 2 syncope episodes after her mouth became numb    Outpatient Medications Prior to Visit  Medication Sig Dispense Refill   amLODipine (NORVASC) 5 MG tablet  Take 1 tablet (5 mg total) by mouth daily. 90 tablet 1   busPIRone (BUSPAR) 5 MG tablet TAKE 1 TABLET BY MOUTH TWICE A DAY 60 tablet 0   DULoxetine (CYMBALTA) 60 MG capsule TAKE 1 CAPSULE BY MOUTH TWICE A DAY 180 capsule 0   hydrALAZINE (APRESOLINE) 10 MG tablet Take 1 tablet (10 mg total) by mouth 3 (three) times daily. 90 tablet 6   Multiple Vitamins-Minerals (MULTIVITAMIN WITH MINERALS) tablet Take 1 tablet by mouth daily.     nortriptyline (PAMELOR) 50 MG capsule TAKE 1 CAPSULE BY MOUTH AT BEDTIME. 90 capsule 3   omeprazole (PRILOSEC) 20 MG capsule TAKE 1 CAPSULE BY MOUTH EVERY DAY 90 capsule 1   potassium chloride (KLOR-CON M10) 10 MEQ tablet Take 1 tablet (10 mEq total) by  mouth daily. 90 tablet 1   pregabalin (LYRICA) 150 MG capsule Take 1 capsule (150 mg total) by mouth 2 (two) times daily. 60 capsule 5   benzonatate (TESSALON) 100 MG capsule Take 1 capsule (100 mg total) by mouth every 8 (eight) hours. (Patient not taking: Reported on 08/26/2021) 21 capsule 0   metroNIDAZOLE (FLAGYL) 500 MG tablet Take 1 tablet (500 mg total) by mouth 2 (two) times daily. (Patient not taking: Reported on 08/26/2021) 14 tablet 0   naproxen (NAPROSYN) 500 MG tablet Take 1 tablet (500 mg total) by mouth 2 (two) times daily. (Patient not taking: Reported on 08/26/2021) 30 tablet 0   tiZANidine (ZANAFLEX) 4 MG tablet Take 1 tablet (4 mg total) by mouth every 6 (six) hours as needed for muscle spasms. (Patient not taking: Reported on 08/26/2021) 30 tablet 0   Vitamin D, Ergocalciferol, (DRISDOL) 1.25 MG (50000 UNIT) CAPS capsule TAKE 1 CAPSULE (50,000 UNITS TOTAL) BY MOUTH EVERY 7 (SEVEN) DAYS. PLEASE MAKE PCP APPOINTMENT. (Patient not taking: Reported on 08/26/2021) 4 capsule 0   No facility-administered medications prior to visit.     ROS Review of Systems  Constitutional:  Negative for activity change, appetite change and fatigue.  HENT:  Negative for congestion, sinus pressure and sore throat.   Eyes:  Negative for visual disturbance.  Respiratory:  Negative for cough, chest tightness, shortness of breath and wheezing.   Cardiovascular:  Negative for chest pain and palpitations.  Gastrointestinal:  Negative for abdominal distention, abdominal pain and constipation.  Endocrine: Negative for polydipsia.  Genitourinary:  Negative for dysuria and frequency.  Musculoskeletal:  Negative for arthralgias and back pain.  Skin:  Negative for rash.  Neurological:  Negative for tremors, light-headedness and numbness.  Hematological:  Does not bruise/bleed easily.  Psychiatric/Behavioral:  Negative for agitation and behavioral problems.    Objective:  BP 137/80    Pulse 80    Ht '5\' 5"'   (1.651 m)    Wt 137 lb (62.1 kg)    SpO2 99%    BMI 22.80 kg/m   BP/Weight 08/26/2021 02/17/2021 0/34/7425  Systolic BP 956 387 -  Diastolic BP 80 84 -  Wt. (Lbs) 137 139 139  BMI 22.8 23.13 23.13      Physical Exam Constitutional:      Appearance: She is well-developed.  Cardiovascular:     Rate and Rhythm: Normal rate.     Heart sounds: Normal heart sounds. No murmur heard. Pulmonary:     Effort: Pulmonary effort is normal.     Breath sounds: Normal breath sounds. No wheezing or rales.  Chest:     Chest wall: No tenderness.  Abdominal:     General:  Bowel sounds are normal. There is no distension.     Palpations: Abdomen is soft. There is mass (LUQ  with superiimposed tender nodule which is mobile).     Tenderness: There is no abdominal tenderness.  Musculoskeletal:        General: Normal range of motion.     Right lower leg: No edema.     Left lower leg: No edema.  Lymphadenopathy:     Cervical: No cervical adenopathy.  Neurological:     Mental Status: She is alert and oriented to person, place, and time.  Psychiatric:        Mood and Affect: Mood normal.    CMP Latest Ref Rng & Units 06/24/2020 07/03/2019 06/12/2019  Glucose 65 - 99 mg/dL 129(H) 109(H) 81  BUN 6 - 24 mg/dL 5(L) 4(L) 4(L)  Creatinine 0.57 - 1.00 mg/dL 0.67 0.50 0.67  Sodium 134 - 144 mmol/L 140 139 139  Potassium 3.5 - 5.2 mmol/L 3.6 3.8 4.3  Chloride 96 - 106 mmol/L 96 97(L) 96  CO2 20 - 29 mmol/L 25 - 29  Calcium 8.7 - 10.2 mg/dL 9.1 - 9.1  Total Protein 6.0 - 8.5 g/dL 7.4 - 6.8  Total Bilirubin 0.0 - 1.2 mg/dL 0.4 - 0.4  Alkaline Phos 44 - 121 IU/L 209(H) - 171(H)  AST 0 - 40 IU/L 33 - 32  ALT 0 - 32 IU/L 18 - 11    Lipid Panel     Component Value Date/Time   CHOL 186 06/24/2020 1408   TRIG 321 (H) 06/24/2020 1408   HDL 34 (L) 06/24/2020 1408   CHOLHDL 5.5 (H) 06/24/2020 1408   CHOLHDL 4.0 07/19/2016 1238   VLDL 26 07/19/2016 1238   LDLCALC 98 06/24/2020 1408    CBC    Component  Value Date/Time   WBC 13.7 (H) 06/12/2019 1057   WBC 18.3 (H) 02/28/2017 2130   RBC 4.17 06/12/2019 1057   RBC 3.79 (L) 02/28/2017 2130   HGB 14.6 07/03/2019 1211   HGB 14.5 06/12/2019 1057   HCT 43.0 07/03/2019 1211   HCT 42.6 06/12/2019 1057   PLT 286 06/12/2019 1057   MCV 102 (H) 06/12/2019 1057   MCH 34.8 (H) 06/12/2019 1057   MCH 35.1 (H) 02/28/2017 2130   MCHC 34.0 06/12/2019 1057   MCHC 35.4 02/28/2017 2130   RDW 13.5 06/12/2019 1057   LYMPHSABS 2.8 06/12/2019 1057   MONOABS 2.1 (H) 12/18/2013 1540   EOSABS 0.2 06/12/2019 1057   BASOSABS 0.1 06/12/2019 1057    Lab Results  Component Value Date   HGBA1C 5.1 (A) 06/12/2019    Assessment & Plan:  1. Left upper quadrant abdominal mass Noticed after a fall 8 months ago Will need abdominal imaging to exclude splenomegaly - CBC with Differential/Platelet - CT Abdomen Pelvis W Contrast; Future  2. Essential hypertension Controlled Continue current regimen Counseled on blood pressure goal of less than 130/80, low-sodium, DASH diet, medication compliance, 150 minutes of moderate intensity exercise per week. Discussed medication compliance, adverse effects. - LP+Non-HDL Cholesterol - CMP14+EGFR - amLODipine (NORVASC) 5 MG tablet; Take 1 tablet (5 mg total) by mouth daily.  Dispense: 90 tablet; Refill: 1  3. Encounter for screening mammogram for malignant neoplasm of breast - MM 3D SCREEN BREAST BILATERAL; Future  4. Other polyneuropathy Stable - DULoxetine (CYMBALTA) 60 MG capsule; Take 1 capsule (60 mg total) by mouth 2 (two) times daily.  Dispense: 180 capsule; Refill: 1  5. Gastroesophageal reflux disease Controlled -  omeprazole (PRILOSEC) 20 MG capsule; Take 1 capsule (20 mg total) by mouth daily.  Dispense: 90 capsule; Refill: 1  6. Anxiety and depression Stable - busPIRone (BUSPAR) 5 MG tablet; Take 1 tablet (5 mg total) by mouth 2 (two) times daily.  Dispense: 180 tablet; Refill: 1    No orders of the  defined types were placed in this encounter.         Charlott Rakes, MD, FAAFP. [facility] [place], [place]   08/26/2021, 8:55 AM

## 2021-08-27 ENCOUNTER — Other Ambulatory Visit: Payer: Self-pay | Admitting: Family Medicine

## 2021-08-27 DIAGNOSIS — R7989 Other specified abnormal findings of blood chemistry: Secondary | ICD-10-CM

## 2021-08-27 LAB — LP+NON-HDL CHOLESTEROL
Cholesterol, Total: 215 mg/dL — ABNORMAL HIGH (ref 100–199)
HDL: 46 mg/dL (ref >39)
LDL Chol Calc (NIH): 136 mg/dL — ABNORMAL HIGH (ref 0–99)
Total Non-HDL-Chol (LDL+VLDL): 169 mg/dL — ABNORMAL HIGH (ref 0–129)
Triglycerides: 182 mg/dL — ABNORMAL HIGH (ref 0–149)
VLDL Cholesterol Cal: 33 mg/dL (ref 5–40)

## 2021-08-27 LAB — CBC WITH DIFFERENTIAL/PLATELET
Basophils Absolute: 0.1 10*3/uL (ref 0.0–0.2)
Basos: 0 %
EOS (ABSOLUTE): 0.2 10*3/uL (ref 0.0–0.4)
Eos: 2 %
Hematocrit: 40.7 % (ref 34.0–46.6)
Hemoglobin: 14.1 g/dL (ref 11.1–15.9)
Immature Grans (Abs): 0 10*3/uL (ref 0.0–0.1)
Immature Granulocytes: 0 %
Lymphocytes Absolute: 2.7 10*3/uL (ref 0.7–3.1)
Lymphs: 20 %
MCH: 35.3 pg — ABNORMAL HIGH (ref 26.6–33.0)
MCHC: 34.6 g/dL (ref 31.5–35.7)
MCV: 102 fL — ABNORMAL HIGH (ref 79–97)
Monocytes Absolute: 1.1 10*3/uL — ABNORMAL HIGH (ref 0.1–0.9)
Monocytes: 8 %
Neutrophils Absolute: 9.5 10*3/uL — ABNORMAL HIGH (ref 1.4–7.0)
Neutrophils: 70 %
Platelets: 199 10*3/uL (ref 150–450)
RBC: 4 x10E6/uL (ref 3.77–5.28)
RDW: 11.8 % (ref 11.7–15.4)
WBC: 13.7 10*3/uL — ABNORMAL HIGH (ref 3.4–10.8)

## 2021-08-27 LAB — CMP14+EGFR
ALT: 49 IU/L — ABNORMAL HIGH (ref 0–32)
AST: 134 IU/L — ABNORMAL HIGH (ref 0–40)
Albumin/Globulin Ratio: 1.1 — ABNORMAL LOW (ref 1.2–2.2)
Albumin: 3.9 g/dL (ref 3.8–4.8)
Alkaline Phosphatase: 402 IU/L — ABNORMAL HIGH (ref 44–121)
BUN/Creatinine Ratio: 6 — ABNORMAL LOW (ref 9–23)
BUN: 4 mg/dL — ABNORMAL LOW (ref 6–24)
Bilirubin Total: 0.6 mg/dL (ref 0.0–1.2)
CO2: 30 mmol/L — ABNORMAL HIGH (ref 20–29)
Calcium: 9.1 mg/dL (ref 8.7–10.2)
Chloride: 91 mmol/L — ABNORMAL LOW (ref 96–106)
Creatinine, Ser: 0.71 mg/dL (ref 0.57–1.00)
Globulin, Total: 3.4 g/dL (ref 1.5–4.5)
Glucose: 123 mg/dL — ABNORMAL HIGH (ref 70–99)
Potassium: 4.5 mmol/L (ref 3.5–5.2)
Sodium: 139 mmol/L (ref 134–144)
Total Protein: 7.3 g/dL (ref 6.0–8.5)
eGFR: 104 mL/min/{1.73_m2} (ref >59)

## 2021-08-31 ENCOUNTER — Other Ambulatory Visit: Payer: Self-pay

## 2021-08-31 ENCOUNTER — Ambulatory Visit: Payer: Medicaid Other | Attending: Family Medicine

## 2021-08-31 DIAGNOSIS — R7989 Other specified abnormal findings of blood chemistry: Secondary | ICD-10-CM | POA: Diagnosis not present

## 2021-09-01 LAB — HEPATITIS B SURFACE ANTIGEN: Hepatitis B Surface Ag: NEGATIVE

## 2021-09-01 LAB — MITOCHONDRIAL ANTIBODIES: Mitochondrial Ab: <20 Units (ref 0.0–20.0)

## 2021-09-01 LAB — HEPATITIS A ANTIBODY, IGM: Hep A IgM: NEGATIVE

## 2021-09-01 LAB — ALPHA-1-ANTITRYPSIN: A-1 Antitrypsin: 201 mg/dL — ABNORMAL HIGH (ref 101–187)

## 2021-09-01 LAB — HEPATITIS A ANTIBODY, TOTAL: hep A Total Ab: NEGATIVE

## 2021-09-01 LAB — HEMOGLOBIN A1C
Est. average glucose Bld gHb Est-mCnc: 105 mg/dL
Hgb A1c MFr Bld: 5.3 % (ref 4.8–5.6)

## 2021-09-01 LAB — GAMMA GT: GGT: 1286 IU/L (ref 0–60)

## 2021-09-01 LAB — CERULOPLASMIN: Ceruloplasmin: 34.7 mg/dL (ref 19.0–39.0)

## 2021-09-03 ENCOUNTER — Ambulatory Visit (HOSPITAL_COMMUNITY)
Admission: RE | Admit: 2021-09-03 | Discharge: 2021-09-03 | Disposition: A | Discharge status: Home / Self Care | Payer: Medicaid Other | Source: Ambulatory Visit | Attending: Family Medicine | Admitting: Family Medicine

## 2021-09-03 ENCOUNTER — Other Ambulatory Visit: Payer: Self-pay

## 2021-09-03 DIAGNOSIS — K6389 Other specified diseases of intestine: Secondary | ICD-10-CM | POA: Diagnosis not present

## 2021-09-03 DIAGNOSIS — R1902 Left upper quadrant abdominal swelling, mass and lump: Secondary | ICD-10-CM | POA: Diagnosis not present

## 2021-09-03 DIAGNOSIS — K7469 Other cirrhosis of liver: Secondary | ICD-10-CM | POA: Diagnosis not present

## 2021-09-03 DIAGNOSIS — K760 Fatty (change of) liver, not elsewhere classified: Secondary | ICD-10-CM | POA: Diagnosis not present

## 2021-09-03 DIAGNOSIS — S3991XA Unspecified injury of abdomen, initial encounter: Secondary | ICD-10-CM | POA: Diagnosis not present

## 2021-09-03 MED ORDER — IOHEXOL 350 MG/ML SOLN
80.0000 mL | Freq: Once | INTRAVENOUS | Status: AC | PRN
  Administered 2021-09-03: 15:00:00 75 mL via INTRAVENOUS

## 2021-09-08 ENCOUNTER — Encounter: Payer: Self-pay | Admitting: Family Medicine

## 2021-09-08 NOTE — Telephone Encounter (Signed)
Contacted pt to see what questions she had in regards to her CT scan. Went over CT scan results with pt. Pt is wanting to know was the scan done on her Cyst as well that is located on her right side.. Pt states provider can respond back to her through [place]

## 2021-09-12 HISTORY — PX: BREAST BIOPSY: SHX20

## 2021-09-27 ENCOUNTER — Ambulatory Visit: Payer: Medicaid Other | Admitting: Neurology

## 2021-09-27 ENCOUNTER — Encounter: Payer: Self-pay | Admitting: Neurology

## 2021-10-01 ENCOUNTER — Ambulatory Visit
Admission: RE | Admit: 2021-10-01 | Discharge: 2021-10-01 | Disposition: A | Discharge status: Home / Self Care | Payer: Medicaid Other | Source: Ambulatory Visit | Attending: Family Medicine | Admitting: Family Medicine

## 2021-10-01 ENCOUNTER — Other Ambulatory Visit: Payer: Self-pay

## 2021-10-01 DIAGNOSIS — Z1231 Encounter for screening mammogram for malignant neoplasm of breast: Secondary | ICD-10-CM | POA: Diagnosis not present

## 2021-10-04 ENCOUNTER — Other Ambulatory Visit: Payer: Self-pay | Admitting: Family Medicine

## 2021-10-04 DIAGNOSIS — R928 Other abnormal and inconclusive findings on diagnostic imaging of breast: Secondary | ICD-10-CM

## 2021-10-08 ENCOUNTER — Encounter: Payer: Self-pay | Admitting: Family Medicine

## 2021-10-10 ENCOUNTER — Other Ambulatory Visit: Payer: Self-pay | Admitting: Family Medicine

## 2021-10-10 DIAGNOSIS — I10 Essential (primary) hypertension: Secondary | ICD-10-CM

## 2021-10-11 ENCOUNTER — Other Ambulatory Visit: Payer: Self-pay | Admitting: Family Medicine

## 2021-10-11 ENCOUNTER — Telehealth: Payer: Self-pay | Admitting: Oncology

## 2021-10-11 DIAGNOSIS — R1902 Left upper quadrant abdominal swelling, mass and lump: Secondary | ICD-10-CM

## 2021-10-11 NOTE — Telephone Encounter (Signed)
[facility] Scheduling Phone Note  I contacted [name] regarding a referral from Dr. Margarita Rana for purpose of evaluation and work up of palpable mass, left abdomen s/p fall from standing.  [name] was aware of her referral and reason.  Information on reason for referral was not necessary.  Patient was informed about the [facility] and the intent being to complete a diagnostic/prognostic work-up for her suspicious findings.  She is aware her appointment is with our Physician Assistant to identify a diagnostic plan of care and to arrange further oncologist or specialist care as indicated.  I confirmed with [name] she has transportation to her [facility] appointment.  Patient is aware to bring a list of medications, as well as insurance cards.  Visitor policy and COVID protocol reviewed with patient as well, including what to expect on arrival to the [facility] regarding check-in process, visit, and potential for labs afterwards.  We look forward to [name] and completing her diagnostic work-up and arranging her subsequent appointment with our oncologist team.  Diagnostic Clinic Specific Info:  Best contact/way to contact patient:  Preferred contact set CHL updated to reflect?:  not applicable Is there a HC POA?:  No  Location of Diagnostic Clinic Appointment and Contact Info Provided to Patient:  [place] [facility] [place], [place] [address] [phone]   Reason for Referral, Clinical Information:  Patient eval'd by PCP at 08/26/2021 OV for palpable left abdomen mass s/p fall from standing.  Subsequent CT Abd performed on 09/03/2021 was unremarkable.  Patient has continued concerns regarding the palpable mass and requested of MD for a referral - referral sent to oncology.  No other studies completed to date of this note.

## 2021-10-11 NOTE — Telephone Encounter (Signed)
Requested medication (s) are due for refill today:   Yes  Requested medication (s) are on the active medication list:   Yes  Future visit scheduled:   No.  Seen a mo. ago   Last ordered: 12/15/2020 #90, 6 refills  Returned because labs out of range for WBC per protocol so failed.   Provider to review.   Requested Prescriptions  Pending Prescriptions Disp Refills   hydrALAZINE (APRESOLINE) 10 MG tablet [Pharmacy Med Name: HYDRALAZINE 10 MG TABLET] 270 tablet 2    Sig: TAKE 1 TABLET BY MOUTH THREE TIMES A DAY     Cardiovascular:  Vasodilators Failed - 10/10/2021  3:07 PM      Failed - WBC in normal range and within 360 days    WBC  Date Value Ref Range Status  08/26/2021 13.7 (H) 3.4 - 10.8 x10E3/uL Final  02/28/2017 18.3 (H) 4.0 - 10.5 K/uL Final          Passed - HCT in normal range and within 360 days    Hematocrit  Date Value Ref Range Status  08/26/2021 40.7 34.0 - 46.6 % Final          Passed - HGB in normal range and within 360 days    Hemoglobin  Date Value Ref Range Status  08/26/2021 14.1 11.1 - 15.9 g/dL Final          Passed - RBC in normal range and within 360 days    RBC  Date Value Ref Range Status  08/26/2021 4.00 3.77 - 5.28 x10E6/uL Final  02/28/2017 3.79 (L) 3.87 - 5.11 MIL/uL Final          Passed - PLT in normal range and within 360 days    Platelets  Date Value Ref Range Status  08/26/2021 199 150 - 450 x10E3/uL Final          Passed - Last BP in normal range    BP Readings from Last 1 Encounters:  08/26/21 137/80          Passed - Valid encounter within last 12 months    Recent Outpatient Visits           1 month ago Left upper quadrant abdominal mass   [place], Enobong, MD   10 months ago Annual physical exam   [place], Enobong, MD   1 year ago Essential hypertension   [place], Enobong, MD   1 year ago  Suspected COVID-19 virus infection   [place], Enobong, MD   2 years ago Screening for diabetes mellitus   [facility] Charlott Rakes, MD

## 2021-10-12 ENCOUNTER — Other Ambulatory Visit: Payer: Self-pay | Admitting: Family Medicine

## 2021-10-12 DIAGNOSIS — I10 Essential (primary) hypertension: Secondary | ICD-10-CM

## 2021-10-12 MED ORDER — HYDRALAZINE HCL 10 MG PO TABS: 10.0000 mg | ORAL_TABLET | Freq: Three times a day (TID) | ORAL | 6 refills | Status: DC

## 2021-10-18 NOTE — Progress Notes (Signed)
[place] Telephone:([phone]) [phone]   Fax:([phone]  INITIAL CONSULTATION:  Patient Care Team: Charlott Rakes, MD as PCP - General (Family Medicine) Alda Berthold, DO as Consulting Physician (Neurology)  CHIEF COMPLAINTS/PURPOSE OF CONSULTATION:  LUQ palpable nodule  HISTORY OF PRESENTING ILLNESS:  [name] 51 y.o. female with medical history significant for anxiety, GERD, HTN, peripheral neuropathy and arthritis.   On review of the previous records, [name] presented to her PCP's office on 08/26/2021 due to a tender palpable mass that was noted on the left side of her abdomen which was appreciated on physical exam. CT scan of the abdomen and pelvis was obtained on 09/03/2021. Findings revealed mild diffuse thickened appearance of the distal colon and fatty liver with cirrhosis.    On exam today, [name] reports that her pain in the left flank started after she fell in the shower approximately one year ago and landed on her left side. She feels a small nodule on the left flank that is tender to palpation. The pain worsens with different positions. She currently takes Lyrica for neuropathic pain without any improvement. Additionally, she reports diffuse abdominal distention and early satiety. She reports difficulty to take deep inspirations. She reports stable energy levels and she is able to complete her daily activities on her own. She denies nausea, vomiting, diarrhea or constipation. She denies easy bruising or signs of active bleeding. She notes night sweats that have been present for several years that she attributes to going through menopause. She denies any fevers, chills, chest pain, cough, peripheral edema or skin changes. She has no other complaints. Remaining 10 point ROS is below.   MEDICAL HISTORY:  Past Medical History:  Diagnosis Date   Anxiety    Arthritis    right foot   GERD (gastroesophageal reflux disease)     History of alcoholism ([place])    History of cellulitis    2015  right side face   Hypertension    followed by pcp  (per pt never had stress test)   Malnutrition ([place])    Metatarsal fracture    right 2nd   Peripheral neuropathy    feet   Strachan's syndrome    painful neuropathy   Wears dentures    upper   Wears glasses     SURGICAL HISTORY: Past Surgical History:  Procedure Laterality Date   MULTIPLE EXTRACTIONS WITH ALVEOLOPLASTY N/A 12/25/2013   Procedure: Extraction of tooth #'s 2,3,4,5,6,8,9,10,11,12,13,14,15,20,21,22,23,24,25,26,27, 54,65,68,12 with alveoloplasty and bilateral mandibular tori reductions;  Surgeon: Lenn Cal, DDS;  Location: [facility];  Service: Oral Surgery;  Laterality: N/A;   ORIF TOE FRACTURE Right 07/03/2019   Procedure: OPEN REDUCTION INTERNAL FIXATION (ORIF) METATARSAL (TOE) FRACTURE;  Surgeon: Trula Slade, [name];  Location: [facility];  Service: Podiatry;  Laterality: Right;  LEG BLOCK   SURAL NERVE BX Right 12/24/2013   Procedure: SURAL NERVE BIOPSY;  Surgeon: Kristeen Miss, MD;  Location: [facility];  Service: Neurosurgery;  Laterality: Right;   TUBAL LIGATION Bilateral 2002   PPTL    SOCIAL HISTORY: Social History   Socioeconomic History   Marital status: Widowed    Spouse name: Not on file   Number of children: 1   Years of education: hs   Highest education level: Not on file  Occupational History   Occupation: food service  Tobacco Use   Smoking status: Every Day    Packs/day: 0.50    Years:  20.00    Pack years: 10.00    Types: Cigarettes   Smokeless tobacco: Never  Vaping Use   Vaping Use: Never used  Substance and Sexual Activity   Alcohol use: Yes    Comment: 07-02-2019-- per pt 4 drinks per day   Drug use: Not Currently    Comment: 07-02-2019  -- per pt last used in age 35s   Sexual activity: Yes    Partners: Male    Birth control/protection: Surgical  Other Topics Concern   Not on file  Social  History Narrative   patient is widowed.  Patient lives with daughter in a 2 story home.    Patient works in Ambulance person.    Education high school.   Right handed.   Caffeine sometimes tea.   Lives in a one story home   Social Determinants of Health   Financial Resource Strain: Not on file  Food Insecurity: Not on file  Transportation Needs: Not on file  Physical Activity: Not on file  Stress: Not on file  Social Connections: Not on file  Intimate Partner Violence: Not on file    FAMILY HISTORY: Family History  Adopted: Yes  Problem Relation Age of Onset   Diabetes Father    Hypertension Mother    Raynaud syndrome Mother    Diabetes Maternal Aunt    Heart disease Maternal Aunt    Diabetes Maternal Uncle    Heart disease Maternal Uncle    Drug abuse Maternal Grandmother    Cancer Maternal Grandmother        intestinal   Hypertension Maternal Grandmother    Cancer Maternal Grandfather        lung   Hypertension Paternal Grandfather    Colon cancer Neg Hx    Colon polyps Neg Hx    Esophageal cancer Neg Hx    Rectal cancer Neg Hx    Stomach cancer Neg Hx     ALLERGIES:  is allergic to macrobid [nitrofurantoin macrocrystal].  MEDICATIONS:  Current Outpatient Medications  Medication Sig Dispense Refill   amLODipine (NORVASC) 5 MG tablet Take 1 tablet (5 mg total) by mouth daily. 90 tablet 1   busPIRone (BUSPAR) 5 MG tablet Take 1 tablet (5 mg total) by mouth 2 (two) times daily. 180 tablet 1   cloNIDine (CATAPRES) 0.1 MG tablet Take 1 tablet (0.1 mg total) by mouth at bedtime. For hot flashes 90 tablet 3   DULoxetine (CYMBALTA) 60 MG capsule Take 1 capsule (60 mg total) by mouth 2 (two) times daily. 180 capsule 1   hydrALAZINE (APRESOLINE) 10 MG tablet Take 1 tablet (10 mg total) by mouth 3 (three) times daily. 90 tablet 6   Multiple Vitamins-Minerals (MULTIVITAMIN WITH MINERALS) tablet Take 1 tablet by mouth daily.     nortriptyline (PAMELOR) 50 MG capsule TAKE 1  CAPSULE BY MOUTH AT BEDTIME. 90 capsule 3   omeprazole (PRILOSEC) 20 MG capsule Take 1 capsule (20 mg total) by mouth daily. 90 capsule 1   potassium chloride (KLOR-CON M10) 10 MEQ tablet Take 1 tablet (10 mEq total) by mouth daily. 90 tablet 1   pregabalin (LYRICA) 150 MG capsule Take 1 capsule (150 mg total) by mouth 2 (two) times daily. 60 capsule 5   No current facility-administered medications for this visit.    REVIEW OF SYSTEMS:   Constitutional: ( - ) fevers, ( - )  chills , ( - ) night sweats Eyes: ( - ) blurriness of vision, ( - )  double vision, ( - ) watery eyes Ears, nose, mouth, throat, and face: ( - ) mucositis, ( - ) sore throat Respiratory: ( - ) cough, ( - ) dyspnea, ( - ) wheezes Cardiovascular: ( - ) palpitation, ( - ) chest discomfort, ( - ) lower extremity swelling Gastrointestinal:  ( - ) nausea, ( - ) heartburn, ( - ) change in bowel habits Skin: ( - ) abnormal skin rashes Lymphatics: ( - ) new lymphadenopathy, ( - ) easy bruising Neurological: ( - ) numbness, ( - ) tingling, ( - ) new weaknesses Behavioral/Psych: ( - ) mood change, ( - ) new changes  All other systems were reviewed with the patient and are negative.  PHYSICAL EXAMINATION: ECOG PERFORMANCE STATUS: 1 - Symptomatic but completely ambulatory  There were no vitals filed for this visit. There were no vitals filed for this visit.  GENERAL: well appearing female in NAD  SKIN: skin color, texture, turgor are normal, no rashes or significant lesions EYES: conjunctiva are pink and non-injected, sclera clear OROPHARYNX: no exudate, no erythema; lips, buccal mucosa, and tongue normal  NECK: supple, non-tender LYMPH:  no palpable lymphadenopathy in the cervical, axillary or supraclavicular lymph nodes.  LUNGS: clear to auscultation and percussion with normal breathing effort HEART: regular rate & rhythm and no murmurs and no lower extremity edema ABDOMEN: Abdomen is distended. Small palpable subcutaneous  nodule, approximately 1-2 cm in diameter, around left flank.  Musculoskeletal: no cyanosis of digits and no clubbing  PSYCH: alert & oriented x 3, fluent speech NEURO: no focal motor/sensory deficits  LABORATORY DATA:  I have reviewed the data as listed CBC Latest Ref Rng & Units 08/26/2021 07/03/2019 06/12/2019  WBC 3.4 - 10.8 x10E3/uL 13.7(H) - 13.7(H)  Hemoglobin 11.1 - 15.9 g/dL 14.1 14.6 14.5  Hematocrit 34.0 - 46.6 % 40.7 43.0 42.6  Platelets 150 - 450 x10E3/uL 199 - 286    CMP Latest Ref Rng & Units 08/26/2021 06/24/2020 07/03/2019  Glucose 70 - 99 mg/dL 123(H) 129(H) 109(H)  BUN 6 - 24 mg/dL 4(L) 5(L) 4(L)  Creatinine 0.57 - 1.00 mg/dL 0.71 0.67 0.50  Sodium 134 - 144 mmol/L 139 140 139  Potassium 3.5 - 5.2 mmol/L 4.5 3.6 3.8  Chloride 96 - 106 mmol/L 91(L) 96 97(L)  CO2 20 - 29 mmol/L 30(H) 25 -  Calcium 8.7 - 10.2 mg/dL 9.1 9.1 -  Total Protein 6.0 - 8.5 g/dL 7.3 7.4 -  Total Bilirubin 0.0 - 1.2 mg/dL 0.6 0.4 -  Alkaline Phos 44 - 121 IU/L 402(H) 209(H) -  AST 0 - 40 IU/L 134(H) 33 -  ALT 0 - 32 IU/L 49(H) 18 -     RADIOGRAPHIC STUDIES: I have personally reviewed the radiological images as listed and agreed with the findings in the report. MM 3D SCREEN BREAST BILATERAL  Result Date: 10/01/2021 CLINICAL DATA:  Screening. EXAM: DIGITAL SCREENING BILATERAL MAMMOGRAM WITH TOMOSYNTHESIS AND CAD TECHNIQUE: Bilateral screening digital craniocaudal and mediolateral oblique mammograms were obtained. Bilateral screening digital breast tomosynthesis was performed. The images were evaluated with computer-aided detection. COMPARISON:  Previous exam(s). ACR Breast Density Category c: The breast tissue is heterogeneously dense, which may obscure small masses. FINDINGS: In the right breast a group of calcifications requires further evaluation. In the left breast a possible group of calcifications requires further evaluation. IMPRESSION: Further evaluation is suggested for calcifications  in the right breast. Further evaluation is suggested for possible calcifications in the left breast. RECOMMENDATION: Diagnostic mammogram  and possibly ultrasound of both breasts. (Code:[id number]) The patient will be contacted regarding the findings, and additional imaging will be scheduled. BI-RADS CATEGORY  0: Incomplete. Need additional imaging evaluation and/or prior mammograms for comparison. Electronically Signed   By: Ammie Ferrier M.D.   On: 10/01/2021 17:03    ASSESSMENT & PLAN HADASSAH RANA is a 51 y.o. female who presents to the clinic for further evaluation for palpable nodule in the left flank. We reviewed the CT scan from 09/03/2021 and a small subcutaneous nodule was identified although not mentioned in the report. We will reach out to the radiologist to determine if nodule is benign versus atypical before pursuing additional workup. Likely benign as patient reports nodule has been there for several months without any change in size. For the time being, advised to monitor nodule and follow up if there is any change including size.   Additionally, CT imaging shows marked hepatomegaly and findings consistent with cirrhosis. We will make a referral to GI for a hepatology workup.   #L flank palpable nodule: --Seen on CT imaging from 09/03/2021, likely benign --Reached out to radiologist to confirm benign process. --No need for biopsy at this time  #Cirrhosis: --Hx of alcohol and drug abuse. --Sent referral to GI   No orders of the defined types were placed in this encounter.   All questions were answered. The patient knows to call the clinic with any problems, questions or concerns.  I have spent a total of 60 minutes minutes of face-to-face and non-face-to-face time, preparing to see the patient, obtaining and/or reviewing separately obtained history, performing a medically appropriate examination, counseling and educating the patient, referring and communicating with other health  care professionals, documenting clinical information in the electronic health record and care coordination.   Dede Query, PA-C Department of Hematology/Oncology [place] at [facility] Phone: [phone]  Patient was seen with Dr. Lorenso Courier  I have read the above note and personally examined the patient. I agree with the assessment and plan as noted above.  Briefly Ms. Brailey Fry is a 51 year old female who presents for evaluation of a nodule in her abdomen as well as abdominal discomfort.  CT imaging of the abdomen does show clear evidence of hepatomegaly which may be the cause of her abdominal discomfort.  Recommend referral to gastroenterology for further evaluation of her liver and cirrhosis.  The nodule she feels is identified on CT scan it does appear benign.  Discussed case with radiology who was not able to comment on the nature of this nodule and recommend an ultrasound which we have requested.   Ledell Peoples, MD Department of Hematology/Oncology [place] at [facility] Phone: [phone] Pager: [phone] Email: [email or web address]

## 2021-10-19 ENCOUNTER — Encounter: Payer: Self-pay | Admitting: Physician Assistant

## 2021-10-19 ENCOUNTER — Other Ambulatory Visit: Payer: Self-pay

## 2021-10-19 ENCOUNTER — Inpatient Hospital Stay: Payer: Medicaid Other

## 2021-10-19 ENCOUNTER — Inpatient Hospital Stay: Payer: Medicaid Other | Attending: Physician Assistant | Admitting: Physician Assistant

## 2021-10-19 VITALS — BP 107/59 | HR 74 | Temp 97.9°F | Resp 20 | Wt 133.7 lb

## 2021-10-19 DIAGNOSIS — F1010 Alcohol abuse, uncomplicated: Secondary | ICD-10-CM | POA: Diagnosis not present

## 2021-10-19 DIAGNOSIS — R222 Localized swelling, mass and lump, trunk: Secondary | ICD-10-CM | POA: Insufficient documentation

## 2021-10-19 DIAGNOSIS — F1910 Other psychoactive substance abuse, uncomplicated: Secondary | ICD-10-CM | POA: Diagnosis not present

## 2021-10-19 DIAGNOSIS — F17210 Nicotine dependence, cigarettes, uncomplicated: Secondary | ICD-10-CM | POA: Diagnosis not present

## 2021-10-19 DIAGNOSIS — K7460 Unspecified cirrhosis of liver: Secondary | ICD-10-CM | POA: Diagnosis not present

## 2021-10-21 ENCOUNTER — Telehealth: Payer: Self-pay | Admitting: Physician Assistant

## 2021-10-21 ENCOUNTER — Telehealth: Payer: Self-pay

## 2021-10-21 DIAGNOSIS — R222 Localized swelling, mass and lump, trunk: Secondary | ICD-10-CM

## 2021-10-21 DIAGNOSIS — K7460 Unspecified cirrhosis of liver: Secondary | ICD-10-CM | POA: Insufficient documentation

## 2021-10-21 NOTE — Telephone Encounter (Signed)
Per Dede Query, PA-C: Please make referral to [facility] for cirrhosis  STAT referral faxed and confirmation received

## 2021-10-21 NOTE — Telephone Encounter (Signed)
I clarified with reading radiologist, Dr. Quintella Reichert, about palpable left sided abdominal nodule that is appreciated on exam. He recommended an ultrasound to further evaluate the nodule .   Discussed recommendations with patient and she would like to proceed with abdominal US. I will follow up with the patient once ultrasound results are reviewed.

## 2021-10-28 ENCOUNTER — Ambulatory Visit (HOSPITAL_COMMUNITY)
Admission: RE | Admit: 2021-10-28 | Discharge: 2021-10-28 | Disposition: A | Discharge status: Home / Self Care | Payer: Medicaid Other | Source: Ambulatory Visit | Attending: Physician Assistant | Admitting: Physician Assistant

## 2021-10-28 ENCOUNTER — Other Ambulatory Visit: Payer: Self-pay

## 2021-10-28 DIAGNOSIS — R109 Unspecified abdominal pain: Secondary | ICD-10-CM | POA: Diagnosis not present

## 2021-10-28 DIAGNOSIS — R222 Localized swelling, mass and lump, trunk: Secondary | ICD-10-CM | POA: Diagnosis not present

## 2021-10-29 ENCOUNTER — Encounter: Payer: Self-pay | Admitting: Physician Assistant

## 2021-11-01 ENCOUNTER — Telehealth: Payer: Self-pay | Admitting: Physician Assistant

## 2021-11-01 NOTE — Telephone Encounter (Signed)
I called and left a voicemail to review the abdominal US results from 10/28/2021. Findings revealed benign 0.7 cm subcutaneous soft tissue nodule. We do not recommend additional workup at this time. Advised to monitor the nodule for any changes including increase in size or other associated symptoms. I sent a myChart message as well to communicate the results and recommendations.

## 2021-11-03 ENCOUNTER — Ambulatory Visit: Payer: Medicaid Other

## 2021-11-03 ENCOUNTER — Other Ambulatory Visit: Payer: Self-pay

## 2021-11-03 ENCOUNTER — Other Ambulatory Visit: Payer: Self-pay | Admitting: Family Medicine

## 2021-11-03 ENCOUNTER — Ambulatory Visit
Admission: RE | Admit: 2021-11-03 | Discharge: 2021-11-03 | Disposition: A | Discharge status: Home / Self Care | Payer: Medicaid Other | Source: Ambulatory Visit | Attending: Family Medicine | Admitting: Family Medicine

## 2021-11-03 ENCOUNTER — Ambulatory Visit: Admission: RE | Admit: 2021-11-03 | Payer: Medicaid Other | Source: Ambulatory Visit

## 2021-11-03 DIAGNOSIS — R928 Other abnormal and inconclusive findings on diagnostic imaging of breast: Secondary | ICD-10-CM

## 2021-11-03 DIAGNOSIS — R921 Mammographic calcification found on diagnostic imaging of breast: Secondary | ICD-10-CM

## 2021-11-03 DIAGNOSIS — R922 Inconclusive mammogram: Secondary | ICD-10-CM | POA: Diagnosis not present

## 2021-11-09 ENCOUNTER — Other Ambulatory Visit: Payer: Self-pay | Admitting: Neurology

## 2021-11-11 ENCOUNTER — Other Ambulatory Visit: Payer: Self-pay | Admitting: Neurology

## 2021-11-11 MED ORDER — PREGABALIN 150 MG PO CAPS: 150.0000 mg | ORAL_CAPSULE | Freq: Two times a day (BID) | ORAL | 0 refills | Status: DC

## 2021-11-11 NOTE — Telephone Encounter (Signed)
Pt called an advised we can send in a refill to last until her 3/15 appointment  ?

## 2021-11-11 NOTE — Telephone Encounter (Signed)
Patient called and stated that the pharmacy told her that our office is not approving her prescription for lyrica? ?

## 2021-11-15 ENCOUNTER — Ambulatory Visit
Admission: RE | Admit: 2021-11-15 | Discharge: 2021-11-15 | Disposition: A | Discharge status: Home / Self Care | Payer: Medicaid Other | Source: Ambulatory Visit | Attending: Family Medicine | Admitting: Family Medicine

## 2021-11-15 DIAGNOSIS — R921 Mammographic calcification found on diagnostic imaging of breast: Secondary | ICD-10-CM

## 2021-11-15 DIAGNOSIS — N6011 Diffuse cystic mastopathy of right breast: Secondary | ICD-10-CM | POA: Diagnosis not present

## 2021-11-23 NOTE — Progress Notes (Deleted)
? ? ?Follow-up Visit ? ? ?Date: 11/23/21 ?  ? ?[name] ?MRN: [id number] ?DOB: 02/08/1971 ? ? ?Interim History: ?[name] is a 51 y.o. 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  The patient was accompanied to the clinic by self. ? ?*** ? ? ?Medications:  ?Current Outpatient Medications on File Prior to Visit  ?Medication Sig Dispense Refill  ? amLODipine (NORVASC) 5 MG tablet Take 1 tablet (5 mg total) by mouth daily. 90 tablet 1  ? busPIRone (BUSPAR) 5 MG tablet Take 1 tablet (5 mg total) by mouth 2 (two) times daily. 180 tablet 1  ? cloNIDine (CATAPRES) 0.1 MG tablet Take 1 tablet (0.1 mg total) by mouth at bedtime. For hot flashes 90 tablet 3  ? DULoxetine (CYMBALTA) 60 MG capsule Take 1 capsule (60 mg total) by mouth 2 (two) times daily. 180 capsule 1  ? hydrALAZINE (APRESOLINE) 10 MG tablet Take 1 tablet (10 mg total) by mouth 3 (three) times daily. 90 tablet 6  ? Multiple Vitamins-Minerals (MULTIVITAMIN WITH MINERALS) tablet Take 1 tablet by mouth daily.    ? nortriptyline (PAMELOR) 50 MG capsule TAKE 1 CAPSULE BY MOUTH AT BEDTIME. 90 capsule 3  ? omeprazole (PRILOSEC) 20 MG capsule Take 1 capsule (20 mg total) by mouth daily. 90 capsule 1  ? potassium chloride (KLOR-CON M10) 10 MEQ tablet Take 1 tablet (10 mEq total) by mouth daily. 90 tablet 1  ? pregabalin (LYRICA) 150 MG capsule Take 1 capsule (150 mg total) by mouth 2 (two) times daily. 30 capsule 0  ? ?No current facility-administered medications on file prior to visit.  ? ? ?Allergies:  ?Allergies  ?Allergen Reactions  ? Macrobid [Nitrofurantoin Macrocrystal] Anaphylaxis and Other (See Comments)  ?  Patient reports 2 syncope episodes after her mouth became numb  ? ? ? ?Vital Signs:  ?LMP  (LMP Unknown)  ?  ?Neurological Exam: ?MENTAL STATUS including orientation to time, place, person, recent and remote  memory, attention span and concentration, language, and fund of knowledge is normal.  Speech is not dysarthric. ? ?MOTOR:  Motor strength is 5/5 in all extremities.  No atrophy, fasciculations or abnormal movements.  No pronator drift.  Tone is normal.   ? ?MSRs:  ?Right                                                                 Left ?brachioradialis 2+  brachioradialis 2+  ?biceps 2+  biceps 2+  ?triceps 2+  triceps 2+  ?patellar 1+  Patellar 1+  ?ankle jerk 0  ankle jerk 0  ? ?SENSORY:  Intact to vibration is intact at the hands, reduced distal to knees and absent at the ankles bilaterally. ? ?COORDINATION/GAIT:   Gait appears stable, unassisted. Unable to perform tandem gait ? ? ? 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 EMG evaluation of the right lower extremity was relatively unremarkable. ?Labs 12/2013:  ANA negative. RPR nonreactive, HIV nonreactive, ESR only at 5, rheumatoid factor negative, copper 127, Vitamin B12 at 381. Heavy metal screen is neg, SPEP no M  protein ?CSF 12/24/2013:  CSF R1 W0 G60 P34 ? ?Labs 11/16/2016:  Vitamin B12 119*, vitamin B1 < 7, MMA 216, folate 2.2* ?Labs 09/11/2017:  Vitamin B12 1042, folate 23.6, vitamin B1 7* ? ?IMPRESSION/PLAN: ? 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 ?2. Nutritional deficiency ? - Continue vitamin B12 1000m32maily, vitamin B1 100m003EJ folic acid 1mg 80mly ? - Encouraged alcohol cessation ? ?Thank you for allowing me to participate in patient's care.  If I can answer any additional questions, I would be pleased to do so.   ? ?Sincerely, ? ? ? ?Clariza Sickman K. PatelPosey Pronto? ? ?

## 2021-11-24 ENCOUNTER — Encounter: Payer: Self-pay | Admitting: Neurology

## 2021-11-24 ENCOUNTER — Ambulatory Visit: Payer: Medicaid Other | Admitting: Neurology

## 2021-12-01 ENCOUNTER — Other Ambulatory Visit: Payer: Self-pay | Admitting: Neurology

## 2021-12-01 NOTE — Telephone Encounter (Signed)
Pt called in stating she is out of her Lyrica. She needs enough to get her to her next appt on 12/10/21. [facility] [address] ?

## 2021-12-02 NOTE — Telephone Encounter (Signed)
Declined.  Patient NS last two visits.  This is a controlled medication, I will give additional refills only after follow-up.   ?

## 2021-12-02 NOTE — Telephone Encounter (Signed)
Called patient and informed her that her refill for lyrica has been declined and she will get refills when she comes to her appt on 3/31. Patient verbalized understanding. ?

## 2021-12-08 ENCOUNTER — Encounter: Payer: Self-pay | Admitting: Family Medicine

## 2021-12-08 ENCOUNTER — Emergency Department (HOSPITAL_COMMUNITY): Payer: Medicaid Other

## 2021-12-08 ENCOUNTER — Ambulatory Visit: Payer: Self-pay | Admitting: *Deleted

## 2021-12-08 ENCOUNTER — Other Ambulatory Visit: Payer: Self-pay

## 2021-12-08 ENCOUNTER — Encounter (HOSPITAL_COMMUNITY): Payer: Self-pay | Admitting: *Deleted

## 2021-12-08 ENCOUNTER — Emergency Department (HOSPITAL_COMMUNITY)
Admission: EM | Admit: 2021-12-08 | Discharge: 2021-12-09 | Disposition: A | Discharge status: Home / Self Care | Payer: Medicaid Other | Attending: Emergency Medicine | Admitting: Emergency Medicine

## 2021-12-08 DIAGNOSIS — R1084 Generalized abdominal pain: Secondary | ICD-10-CM | POA: Insufficient documentation

## 2021-12-08 DIAGNOSIS — D72829 Elevated white blood cell count, unspecified: Secondary | ICD-10-CM | POA: Insufficient documentation

## 2021-12-08 DIAGNOSIS — R222 Localized swelling, mass and lump, trunk: Secondary | ICD-10-CM | POA: Diagnosis not present

## 2021-12-08 DIAGNOSIS — R7401 Elevation of levels of liver transaminase levels: Secondary | ICD-10-CM | POA: Diagnosis not present

## 2021-12-08 DIAGNOSIS — I10 Essential (primary) hypertension: Secondary | ICD-10-CM | POA: Diagnosis not present

## 2021-12-08 DIAGNOSIS — D649 Anemia, unspecified: Secondary | ICD-10-CM | POA: Diagnosis not present

## 2021-12-08 DIAGNOSIS — Z87891 Personal history of nicotine dependence: Secondary | ICD-10-CM | POA: Diagnosis not present

## 2021-12-08 DIAGNOSIS — R109 Unspecified abdominal pain: Secondary | ICD-10-CM | POA: Diagnosis not present

## 2021-12-08 DIAGNOSIS — K760 Fatty (change of) liver, not elsewhere classified: Secondary | ICD-10-CM | POA: Diagnosis not present

## 2021-12-08 DIAGNOSIS — N3289 Other specified disorders of bladder: Secondary | ICD-10-CM | POA: Diagnosis not present

## 2021-12-08 DIAGNOSIS — R0602 Shortness of breath: Secondary | ICD-10-CM | POA: Diagnosis not present

## 2021-12-08 LAB — URINALYSIS, MICROSCOPIC (REFLEX)

## 2021-12-08 LAB — URINALYSIS, ROUTINE W REFLEX MICROSCOPIC
Glucose, UA: NEGATIVE mg/dL
Hgb urine dipstick: NEGATIVE
Ketones, ur: NEGATIVE mg/dL
Leukocytes,Ua: NEGATIVE
Nitrite: NEGATIVE
Protein, ur: 30 mg/dL — AB
Specific Gravity, Urine: <1.005 — ABNORMAL LOW (ref 1.005–1.030)
pH: 6 (ref 5.0–8.0)

## 2021-12-08 LAB — COMPREHENSIVE METABOLIC PANEL
ALT: 37 U/L (ref 0–44)
AST: 155 U/L — ABNORMAL HIGH (ref 15–41)
Albumin: 3.2 g/dL — ABNORMAL LOW (ref 3.5–5.0)
Alkaline Phosphatase: 362 U/L — ABNORMAL HIGH (ref 38–126)
Anion gap: 12 (ref 5–15)
BUN: <5 mg/dL — ABNORMAL LOW (ref 6–20)
CO2: 31 mmol/L (ref 22–32)
Calcium: 9.3 mg/dL (ref 8.9–10.3)
Chloride: 94 mmol/L — ABNORMAL LOW (ref 98–111)
Creatinine, Ser: 0.62 mg/dL (ref 0.44–1.00)
GFR, Estimated: >60 mL/min (ref >60)
Glucose, Bld: 114 mg/dL — ABNORMAL HIGH (ref 70–99)
Potassium: 3.3 mmol/L — ABNORMAL LOW (ref 3.5–5.1)
Sodium: 137 mmol/L (ref 135–145)
Total Bilirubin: 0.9 mg/dL (ref 0.3–1.2)
Total Protein: 7.3 g/dL (ref 6.5–8.1)

## 2021-12-08 LAB — CBC WITH DIFFERENTIAL/PLATELET
Abs Immature Granulocytes: 0.05 10*3/uL (ref 0.00–0.07)
Basophils Absolute: 0.1 10*3/uL (ref 0.0–0.1)
Basophils Relative: 1 %
Eosinophils Absolute: 0.2 10*3/uL (ref 0.0–0.5)
Eosinophils Relative: 2 %
HCT: 44.7 % (ref 36.0–46.0)
Hemoglobin: 14.9 g/dL (ref 12.0–15.0)
Immature Granulocytes: 0 %
Lymphocytes Relative: 20 %
Lymphs Abs: 3.1 10*3/uL (ref 0.7–4.0)
MCH: 35.6 pg — ABNORMAL HIGH (ref 26.0–34.0)
MCHC: 33.3 g/dL (ref 30.0–36.0)
MCV: 106.9 fL — ABNORMAL HIGH (ref 80.0–100.0)
Monocytes Absolute: 1.1 10*3/uL — ABNORMAL HIGH (ref 0.1–1.0)
Monocytes Relative: 7 %
Neutro Abs: 11.2 10*3/uL — ABNORMAL HIGH (ref 1.7–7.7)
Neutrophils Relative %: 70 %
Platelets: 222 10*3/uL (ref 150–400)
RBC: 4.18 MIL/uL (ref 3.87–5.11)
RDW: 13.9 % (ref 11.5–15.5)
WBC: 15.8 10*3/uL — ABNORMAL HIGH (ref 4.0–10.5)
nRBC: 0 % (ref 0.0–0.2)

## 2021-12-08 LAB — LIPASE, BLOOD: Lipase: 26 U/L (ref 11–51)

## 2021-12-08 MED ORDER — MORPHINE SULFATE (PF) 4 MG/ML IV SOLN
4.0000 mg | Freq: Once | INTRAVENOUS | Status: AC
  Administered 2021-12-08: 4 mg via INTRAVENOUS
  Filled 2021-12-08: qty 1

## 2021-12-08 MED ORDER — ONDANSETRON HCL 4 MG/2ML IJ SOLN
4.0000 mg | Freq: Once | INTRAMUSCULAR | Status: AC
  Administered 2021-12-08: 4 mg via INTRAVENOUS
  Filled 2021-12-08: qty 2

## 2021-12-08 MED ORDER — IOHEXOL 300 MG/ML  SOLN
100.0000 mL | Freq: Once | INTRAMUSCULAR | Status: AC | PRN
  Administered 2021-12-08: 100 mL via INTRAVENOUS

## 2021-12-08 NOTE — Telephone Encounter (Signed)
?  Chief Complaint: abdominal pain- patient's significant other is calling ?Symptoms: pain ?Frequency: started Sunday ?Pertinent Negatives: Patient denies   ?Disposition: '[x]'$ ED /'[]'$ Urgent Care (no appt availability in office) / '[]'$ Appointment(In office/virtual)/ '[]'$  [facility]/ '[]'$ Home Care/ '[]'$ Refused Recommended Disposition /'[]'$ [facility]/ '[]'$  Follow-up with PCP ?Additional Notes: Advised if pain is severe and not getting better- ED is advised  ?

## 2021-12-08 NOTE — ED Triage Notes (Signed)
The pt has alcoholic cirrhosis of the liver  she last drank Saturday    lmp none  some diarrhea no nausea or vomiting ?

## 2021-12-08 NOTE — Telephone Encounter (Signed)
Reason for Disposition ? [1] SEVERE pain (e.g., excruciating) AND [2] present > 1 hour ? ?Answer Assessment - Initial Assessment Questions ?1. LOCATION: "Where does it hurt?"  ?    Lower R side- some pain on left front/back ?2. RADIATION: "Does the pain shoot anywhere else?" (e.g., chest, back) ?    *No Answer* ?3. ONSET: "When did the pain begin?" (e.g., minutes, hours or days ago)  ?    Sunday ?4. SUDDEN: "Gradual or sudden onset?" ?    *No Answer* ?5. PATTERN "Does the pain come and go, or is it constant?" ?   - If constant: "Is it getting better, staying the same, or worsening?"  ?    (Note: Constant means the pain never goes away completely; most serious pain is constant and it progresses)  ?   - If intermittent: "How long does it last?" "Do you have pain now?" ?    (Note: Intermittent means the pain goes away completely between bouts) ?    constant ?6. SEVERITY: "How bad is the pain?"  (e.g., Scale 1-10; mild, moderate, or severe) ?  - MILD (1-3): doesn't interfere with normal activities, abdomen soft and not tender to touch  ?  - MODERATE (4-7): interferes with normal activities or awakens from sleep, abdomen tender to touch  ?  - SEVERE (8-10): excruciating pain, doubled over, unable to do any normal activities  ?    severe ?7. RECURRENT SYMPTOM: "Have you ever had this type of stomach pain before?" If Yes, ask: "When was the last time?" and "What happened that time?"  ?    unsure ?8. CAUSE: "What do you think is causing the stomach pain?" ?    Cirrhosis- not sure if that is causing pain  ?9. RELIEVING/AGGRAVATING FACTORS: "What makes it better or worse?" (e.g., movement, antacids, bowel movement) ?    *No Answer* ?10. OTHER SYMPTOMS: "Do you have any other symptoms?" (e.g., back pain, diarrhea, fever, urination pain, vomiting) ?      *No Answer* ?11. PREGNANCY: "Is there any chance you are pregnant?" "When was your last menstrual period?" ?      *No Answer* ? ?Protocols used: Abdominal Pain -  Female-A-AH ? ?

## 2021-12-08 NOTE — ED Provider Notes (Signed)
?[facility] ?Provider Note ? ? ?CSN: [id number] ?Arrival date & time: 12/08/21  1752 ? ?  ? ?History ? ?Chief Complaint  ?Patient presents with  ? Abdominal Pain  ? ? ?[name] is a 51 y.o. female with history of GERD, hypertension, anxiety, peripheral neuropathy, and arthritis who presents with concern for new generalized abdominal pain in context of alcoholic cirrhosis of the liver.  Pain has been present for 72 hours but she denies any fevers, chills, nausea, vomiting or diarrhea.  Pain is worse in the right upper quadrant but is generalized.  No new change in her skin tone.  Accompanied by her boyfriend and daughter at the bedside.  She endorses normal bowel movements without melena or hematochezia, denies any signs of easy bruising or bleeding.  Patient scheduled to see GI for for first appointment on December 24, 2021.  She has not had any sort of intra-abdominal intervention. ? ?I personally reviewed this patient's medical records.  In addition to the above listed concerns she has history of tobacco abuse, insomnia, and peripheral neuropathy. ? ?HPI ? ?  ? ?Home Medications ?Prior to Admission medications   ?Medication Sig Start Date End Date Taking? Authorizing Provider  ?amLODipine (NORVASC) 5 MG tablet Take 1 tablet (5 mg total) by mouth daily. 08/26/21   Charlott Rakes, MD  ?busPIRone (BUSPAR) 5 MG tablet Take 1 tablet (5 mg total) by mouth 2 (two) times daily. 08/26/21   Charlott Rakes, MD  ?cloNIDine (CATAPRES) 0.1 MG tablet Take 1 tablet (0.1 mg total) by mouth at bedtime. For hot flashes 08/26/21   Charlott Rakes, MD  ?DULoxetine (CYMBALTA) 60 MG capsule Take 1 capsule (60 mg total) by mouth 2 (two) times daily. 08/26/21   Charlott Rakes, MD  ?hydrALAZINE (APRESOLINE) 10 MG tablet Take 1 tablet (10 mg total) by mouth 3 (three) times daily. 10/12/21   Charlott Rakes, MD  ?Multiple Vitamins-Minerals (MULTIVITAMIN WITH MINERALS) tablet Take 1 tablet by mouth  daily.    [provider]  ?nortriptyline (PAMELOR) 50 MG capsule TAKE 1 CAPSULE BY MOUTH AT BEDTIME. 04/27/21   Narda Amber K, DO  ?omeprazole (PRILOSEC) 20 MG capsule Take 1 capsule (20 mg total) by mouth daily. 08/26/21   Charlott Rakes, MD  ?potassium chloride (KLOR-CON M10) 10 MEQ tablet Take 1 tablet (10 mEq total) by mouth daily. 08/26/21   Charlott Rakes, MD  ?pregabalin (LYRICA) 150 MG capsule Take 1 capsule (150 mg total) by mouth 2 (two) times daily. 11/11/21   Alda Berthold, DO  ?   ? ?Allergies    ?Macrobid [nitrofurantoin macrocrystal]   ? ?Review of Systems   ?Review of Systems  ?Constitutional: Negative.   ?HENT: Negative.    ?Respiratory: Negative.    ?Gastrointestinal:  Positive for abdominal pain. Negative for diarrhea, nausea and vomiting.  ?Genitourinary: Negative.   ?Musculoskeletal: Negative.   ?Skin: Negative.   ?Neurological: Negative.   ?Hematological: Negative.   ? ?Physical Exam ?Updated Vital Signs ?BP (!) 152/97 (BP Location: Right Arm)   Pulse 83   Temp 98.3 ?F (36.8 ?C)   Resp 16   Ht '5\' 5"'$  (1.651 m)   Wt 60.6 kg   LMP  (LMP Unknown)   SpO2 96%   BMI 22.23 kg/m?  ?Physical Exam ?Vitals and nursing note reviewed.  ?Constitutional:   ?   Appearance: She is not ill-appearing or toxic-appearing.  ?HENT:  ?   Head: Normocephalic and atraumatic.  ?  Nose: Nose normal.  ?   Mouth/Throat:  ?   Mouth: Mucous membranes are moist.  ?   Pharynx: Oropharynx is clear. Uvula midline. No oropharyngeal exudate or posterior oropharyngeal erythema.  ?   Tonsils: No tonsillar exudate.  ?Eyes:  ?   General: Lids are normal. Vision grossly intact. No scleral icterus.    ?   Right eye: No discharge.     ?   Left eye: No discharge.  ?   Conjunctiva/sclera: Conjunctivae normal.  ?   Pupils: Pupils are equal, round, and reactive to light.  ?Neck:  ?   Trachea: Trachea and phonation normal.  ?Cardiovascular:  ?   Rate and Rhythm: Normal rate and regular rhythm.  ?   Pulses: Normal pulses.   ?   Heart sounds: Normal heart sounds. No murmur heard. ?Pulmonary:  ?   Effort: Pulmonary effort is normal. No tachypnea, bradypnea, accessory muscle usage, prolonged expiration or respiratory distress.  ?   Breath sounds: Normal breath sounds. No wheezing or rales.  ?Chest:  ?   Chest wall: No mass, lacerations, deformity, swelling, tenderness or crepitus.  ?Abdominal:  ?   General: Bowel sounds are normal. There is distension.  ?   Palpations: Abdomen is soft. There is hepatomegaly. There is no fluid wave or pulsatile mass.  ?   Tenderness: There is generalized abdominal tenderness. There is no right CVA tenderness, left CVA tenderness, guarding or rebound.  ?Musculoskeletal:     ?   General: No deformity.  ?   Cervical back: Normal range of motion and neck supple. No edema, rigidity or crepitus. No pain with movement or spinous process tenderness.  ?   Right lower leg: No edema.  ?   Left lower leg: No edema.  ?Lymphadenopathy:  ?   Cervical: No cervical adenopathy.  ?Skin: ?   General: Skin is warm and dry.  ?   Capillary Refill: Capillary refill takes less than 2 seconds.  ?Neurological:  ?   General: No focal deficit present.  ?   Mental Status: She is alert and oriented to person, place, and time. Mental status is at baseline.  ?   GCS: GCS eye subscore is 4. GCS verbal subscore is 5. GCS motor subscore is 6.  ?   Gait: Gait is intact.  ?Psychiatric:     ?   Mood and Affect: Mood normal.  ? ? ?ED Results / Procedures / Treatments   ?Labs ?(all labs ordered are listed, but only abnormal results are displayed) ?Labs Reviewed  ?CBC WITH DIFFERENTIAL/PLATELET - Abnormal; Notable for the following components:  ?    Result Value  ? WBC 15.8 (*)   ? MCV 106.9 (*)   ? MCH 35.6 (*)   ? Neutro Abs 11.2 (*)   ? Monocytes Absolute 1.1 (*)   ? All other components within normal limits  ?COMPREHENSIVE METABOLIC PANEL - Abnormal; Notable for the following components:  ? Potassium 3.3 (*)   ? Chloride 94 (*)   ? Glucose,  Bld 114 (*)   ? BUN <5 (*)   ? Albumin 3.2 (*)   ? AST 155 (*)   ? Alkaline Phosphatase 362 (*)   ? All other components within normal limits  ?URINALYSIS, ROUTINE W REFLEX MICROSCOPIC - Abnormal; Notable for the following components:  ? Color, Urine AMBER (*)   ? APPearance HAZY (*)   ? Specific Gravity, Urine <1.005 (*)   ? Bilirubin Urine SMALL (*)   ?  Protein, ur 30 (*)   ? All other components within normal limits  ?URINALYSIS, MICROSCOPIC (REFLEX) - Abnormal; Notable for the following components:  ? Bacteria, UA RARE (*)   ? All other components within normal limits  ?LIPASE, BLOOD  ?PROTIME-INR  ? ? ?EKG ?EKG Interpretation ? ?Date/Time:  Wednesday December 08 2021 23:16:11 EDT ?Ventricular Rate:  86 ?PR Interval:  135 ?QRS Duration: 81 ?QT Interval:  375 ?QTC Calculation: 449 ?R Axis:   47 ?Text Interpretation: Sinus rhythm Borderline repolarization abnormality Baseline wander in lead(s) II III aVF When compared with ECG of 02/28/2017, No significant change was found Confirmed by Delora Fuel ([id number]) on 12/08/2021 11:20:09 PM ? ?Radiology ?CT Abdomen Pelvis W Contrast ? ?Result Date: 12/08/2021 ?CLINICAL DATA:  History of alcoholic cirrhosis with persistent abdominal pain, initial encounter EXAM: CT ABDOMEN AND PELVIS WITH CONTRAST TECHNIQUE: Multidetector CT imaging of the abdomen and pelvis was performed using the standard protocol following bolus administration of intravenous contrast. RADIATION DOSE REDUCTION: This exam was performed according to the departmental dose-optimization program which includes automated exposure control, adjustment of the mA and/or kV according to patient size and/or use of iterative reconstruction technique. CONTRAST:  152m OMNIPAQUE IOHEXOL 300 MG/ML  SOLN COMPARISON:  09/03/2021 FINDINGS: Lower chest: No acute abnormality. Hepatobiliary: Fatty liver is noted. Differential enhancement is noted consistent with some cirrhotic change. No focal discrete mass is noted. Gallbladder is  within normal limits. Pancreas: Unremarkable. No pancreatic ductal dilatation or surrounding inflammatory changes. Spleen: Normal in size without focal abnormality. Adrenals/Urinary Tract: Adrenal glands are within

## 2021-12-08 NOTE — ED Provider Triage Note (Signed)
Emergency Medicine Provider Triage Evaluation Note ? ?[name] , a 51 y.o. female  was evaluated in triage.  Pt complains of abdominal pain.  Patient presents with sharp pains in her abdomen and associated swelling. Swelling is causing her to have difficulty breathing. She has a hx of alcoholic cirrhosis. She has been seen by oncology recently because of a concerning mass. This was deemed to be noncancerous. She has a GI referral and appt on April the 14th, but symptoms have worsened, so she presented here. She has never been diagnosed with ascites or had a paracentesis. ? ?Review of Systems  ?Positive:  ?Negative:  ? ?Physical Exam  ?BP (!) 152/92   Pulse 88   Temp 98.3 ?F (36.8 ?C)   Resp 18   Ht '5\' 5"'$  (1.651 m)   Wt 60.6 kg   LMP  (LMP Unknown)   SpO2 97%   BMI 22.23 kg/m?  ?Gen:   Awake, no distress   ?Resp:  Normal effort  ?MSK:   Moves extremities without difficulty  ?Other:  Distended abdomen, Generalized tenderness with guarding.  ? ?Medical Decision Making  ?Medically screening exam initiated at 6:09 PM.  Appropriate orders placed.  VAIDEHI BRADDY was informed that the remainder of the evaluation will be completed by another provider, this initial triage assessment does not replace that evaluation, and the importance of remaining in the ED until their evaluation is complete. ? ? ?  ?Adolphus Birchwood, PA-C ?12/08/21 1812 ? ?

## 2021-12-09 MED ORDER — OXYCODONE HCL 5 MG PO TABS
5.0000 mg | ORAL_TABLET | ORAL | 0 refills | Status: DC | PRN
Start: 2021-12-09 — End: 2021-12-24

## 2021-12-09 NOTE — Discharge Instructions (Signed)
You are seen in the ER today for your abdominal pain.  Your work-up was very reassuring.  Does not appear to be any emergent problem at this time.  Please follow-up with the gastroenterologist listed below.  Call them if you have not heard back about a sooner appointment.  You may use the prescribed pain medication as needed and return to the ER with any severe symptoms. ?

## 2021-12-10 ENCOUNTER — Ambulatory Visit: Payer: Medicaid Other | Admitting: Neurology

## 2021-12-10 ENCOUNTER — Encounter: Payer: Self-pay | Admitting: Neurology

## 2021-12-10 ENCOUNTER — Telehealth: Payer: Self-pay

## 2021-12-10 VITALS — BP 135/84 | HR 82 | Ht 65.0 in | Wt 127.0 lb

## 2021-12-10 DIAGNOSIS — G6289 Other specified polyneuropathies: Secondary | ICD-10-CM | POA: Diagnosis not present

## 2021-12-10 DIAGNOSIS — G621 Alcoholic polyneuropathy: Secondary | ICD-10-CM | POA: Diagnosis not present

## 2021-12-10 MED ORDER — PREGABALIN 150 MG PO CAPS: 150.0000 mg | ORAL_CAPSULE | Freq: Three times a day (TID) | ORAL | 5 refills | Status: DC

## 2021-12-10 MED ORDER — NORTRIPTYLINE HCL 50 MG PO CAPS: 50.0000 mg | ORAL_CAPSULE | Freq: Every day | ORAL | 3 refills | Status: DC

## 2021-12-10 NOTE — Telephone Encounter (Signed)
Transition Care Management Follow-up Telephone Call ?Date of discharge and from where: 12/09/2021 from [facility] ?How have you been since you were released from the hospital? Patient stated that she is feeling some better. Patient stated ?Any questions or concerns? No ? ?Items Reviewed: ?Did the pt receive and understand the discharge instructions provided? Yes  ?Medications obtained and verified? Yes  ?Other? No  ?Any new allergies since your discharge? No  ?Dietary orders reviewed? No ?Do you have support at home? Yes  ? ?Functional Questionnaire: (I = Independent and D = Dependent) ?ADLs: I ? ?Bathing/Dressing- I ? ?Meal Prep- I ? ?Eating- I ? ?Maintaining continence- I ? ?Transferring/Ambulation- I ? ?Managing Meds- I ? ? ?Follow up appointments reviewed: ? ?PCP Hospital f/u appt confirmed? No   ?Specialist Hospital f/u appt confirmed? Yes  Scheduled to see [facility] on 12/24/2021 @ 03:00pm . ?Are transportation arrangements needed? No  ?If their condition worsens, is the pt aware to call PCP or go to the Emergency Dept.? Yes ?Was the patient provided with contact information for the PCP's office or ED? Yes ?Was to pt encouraged to call back with questions or concerns? Yes ? ?

## 2021-12-10 NOTE — Progress Notes (Signed)
? ? ?Follow-up Visit ? ? ?Date: 12/10/21 ?  ? ?[name] ?MRN: [id number] ?DOB: [birth year] ? ? ?Interim History: ?[name] is a 51 y.o. 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  The patient was accompanied to the clinic by self. ? ?She has been diagnosed with liver cirrhosis and is scheduled to see GI next month.  She is tearful talking about this.  As far as her neuropathy, there has not been and significant change.  She continues to have numbness/tingling in the hands and legs.  She takes Lyrica 150mg  in the morning and 6pm.  She has been self-adjusted her Lyrica to 150mg  three times daily and ends up running out of the medication. She continues to take nortriptyline 50mg  bedtime, but does not feel that it helps as much.   ? ?Medications:  ?Current Outpatient Medications on File Prior to Visit  ?Medication Sig Dispense Refill  ? amLODipine (NORVASC) 5 MG tablet Take 1 tablet (5 mg total) by mouth daily. 90 tablet 1  ? busPIRone (BUSPAR) 5 MG tablet Take 1 tablet (5 mg total) by mouth 2 (two) times daily. 180 tablet 1  ? cloNIDine (CATAPRES) 0.1 MG tablet Take 1 tablet (0.1 mg total) by mouth at bedtime. For hot flashes 90 tablet 3  ? DULoxetine (CYMBALTA) 60 MG capsule Take 1 capsule (60 mg total) by mouth 2 (two) times daily. 180 capsule 1  ? hydrALAZINE (APRESOLINE) 10 MG tablet Take 1 tablet (10 mg total) by mouth 3 (three) times daily. 90 tablet 6  ? Multiple Vitamins-Minerals (MULTIVITAMIN WITH MINERALS) tablet Take 1 tablet by mouth daily.    ? nortriptyline (PAMELOR) 50 MG capsule TAKE 1 CAPSULE BY MOUTH AT BEDTIME. 90 capsule 3  ? omeprazole (PRILOSEC) 20 MG capsule Take 1 capsule (20 mg total) by mouth daily. 90 capsule 1  ? oxyCODONE (ROXICODONE) 5 MG immediate release tablet Take 1 tablet (5 mg total) by mouth every 4 (four) hours as needed for severe pain.  15 tablet 0  ? potassium chloride (KLOR-CON M10) 10 MEQ tablet Take 1 tablet (10 mEq total) by mouth daily. 90 tablet 1  ? pregabalin (LYRICA) 150 MG capsule Take 1 capsule (150 mg total) by mouth 2 (two) times daily. 30 capsule 0  ? ?No current facility-administered medications on file prior to visit.  ? ? ?Allergies:  ?Allergies  ?Allergen Reactions  ? Macrobid [Nitrofurantoin Macrocrystal] Anaphylaxis and Other (See Comments)  ?  Patient reports 2 syncope episodes after her mouth became numb  ? ? ? ?Vital Signs:  ?BP 135/84   Pulse 82   Ht 5\' 5"  (1.651 m)   Wt 127 lb (57.6 kg)   LMP  (LMP Unknown)   SpO2 96%   BMI 21.13 kg/m?  ?  ?Neurological Exam: ?MENTAL STATUS including orientation to time, place, person, recent and remote memory, attention span and concentration, language, and fund of knowledge is normal.  Speech is not dysarthric. ? ?MOTOR:  Motor strength is 5/5 in all extremities.  Generalized loss of muscle bulk throughout, fasciculations or abnormal movements.  No pronator drift.  Tone is normal.   ? ?MSRs:  ?Right  Left ?brachioradialis 2+  brachioradialis 2+  ?biceps 2+  biceps 2+  ?triceps 2+  triceps 2+  ?patellar 1+  Patellar 1+  ?ankle jerk 0  ankle jerk 0  ? ?SENSORY:  Intact to vibration is intact at the hands, reduced distal to knees and absent at the ankles bilaterally. ? ?COORDINATION/GAIT:   Gait appears mildly unsteady, unassisted.  ? ? 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 EMG evaluation of the right lower extremity was relatively unremarkable. ?Labs 12/2013:  ANA negative. RPR nonreactive, HIV nonreactive, ESR only at 5, rheumatoid factor negative, copper 127, Vitamin B12 at 381. Heavy metal screen is neg, SPEP no M protein ?CSF 12/24/2013:  CSF R1 W0 G60 P34 ? ?Labs 11/16/2016:  Vitamin B12 119*, vitamin B1 <  7, MMA 216, folate 2.2* ?Labs 09/11/2017:  Vitamin B12 1042, folate 23.6, vitamin B1 7* ? 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 - Increase Lyrica 150mg  three times daily ? - Continue nortriptyline 50mg  at bedtime ? - She also takes Cymbalta 60mg  BID prescribed by her PCP ? ?2.  Nutritional deficiency ? - Continue vitamin B12 1044mcg, vitamin B1 100mg , and folic 1mg  daily ? ?Return to clinic in 9 months ? ? ?Thank you for allowing me to participate in patient's care.  If I can answer any additional questions, I would be pleased to do so.   ? ?Sincerely, ? ? ? ?Jaycob Mcclenton K. Posey Pronto, DO ? ? ?

## 2021-12-10 NOTE — Patient Instructions (Signed)
Increase Lyrica to '150mg'$  three times daily ? ?Continue nortriptyline '50mg'$  at bedtime ? ?Return to clinic in 9 months ?

## 2021-12-24 ENCOUNTER — Encounter: Payer: Self-pay | Admitting: Gastroenterology

## 2021-12-24 ENCOUNTER — Ambulatory Visit: Payer: Medicaid Other | Admitting: Gastroenterology

## 2021-12-24 ENCOUNTER — Other Ambulatory Visit (INDEPENDENT_AMBULATORY_CARE_PROVIDER_SITE_OTHER): Payer: Medicaid Other

## 2021-12-24 VITALS — BP 108/64 | HR 80 | Ht 65.0 in | Wt 128.0 lb

## 2021-12-24 DIAGNOSIS — K7030 Alcoholic cirrhosis of liver without ascites: Secondary | ICD-10-CM

## 2021-12-24 DIAGNOSIS — R7989 Other specified abnormal findings of blood chemistry: Secondary | ICD-10-CM

## 2021-12-24 DIAGNOSIS — R1010 Upper abdominal pain, unspecified: Secondary | ICD-10-CM

## 2021-12-24 LAB — IBC + FERRITIN
Ferritin: 58.8 ng/mL (ref 10.0–291.0)
Iron: 57 ug/dL (ref 42–145)
Saturation Ratios: 16.8 % — ABNORMAL LOW (ref 20.0–50.0)
TIBC: 340.2 ug/dL (ref 250.0–450.0)
Transferrin: 243 mg/dL (ref 212.0–360.0)

## 2021-12-24 LAB — PROTIME-INR
INR: 1.1 ratio — ABNORMAL HIGH (ref 0.8–1.0)
Prothrombin Time: 11.6 s (ref 9.6–13.1)

## 2021-12-24 NOTE — Patient Instructions (Addendum)
Your provider has requested that you go to the basement level for lab work before leaving today. Press "B" on the elevator. The lab is located at the first door on the left as you exit the elevator. ? ?Please follow up with Dr Loletha Carrow on 02/22/22 at 10:40 am ? ?If you are age 51 or older, your body mass index should be between 23-30. Your Body mass index is 21.3 kg/m?Marland Kitchen If this is out of the aforementioned range listed, please consider follow up with your Primary Care Provider. ? ?If you are age 37 or younger, your body mass index should be between 19-25. Your Body mass index is 21.3 kg/m?Marland Kitchen If this is out of the aformentioned range listed, please consider follow up with your Primary Care Provider.  ? ?________________________________________________________ ? ?The [facility] [facility] providers would like to encourage you to use [facility] to communicate with providers for non-urgent requests or questions.  Due to long hold times on the telephone, sending your provider a message by [facility] may be a faster and more efficient way to get a response.  Please allow 48 business hours for a response.  Please remember that this is for non-urgent requests.  ?_______________________________________________________ ? ?Due to recent changes in healthcare laws, you may see the results of your imaging and laboratory studies on MyChart before your provider has had a chance to review them.  We understand that in some cases there may be results that are confusing or concerning to you. Not all laboratory results come back in the same time frame and the provider may be waiting for multiple results in order to interpret others.  Please give [place] 48 hours in order for your provider to thoroughly review all the results before contacting the office for clarification of your results.  ? ?

## 2021-12-24 NOTE — Progress Notes (Signed)
? ? ?[facility] Consult Note: ? ?History: ?[name] ?12/24/2021 ? ?Referring provider: Charlott Rakes, MD ? ?Reason for consult/chief complaint: Cirrhosis (New onset. Patient just found out, but her mother states that she was in the hospital years back and imaging did show some liver damage. She has fatigue, 10 pound weight loss.) ? ? ?Subjective  ?HPI: ?I last saw [name] for a screening colonoscopy in June 2022 and polyps were removed. ?She is now referred back to [place] by hematology for recent diagnosis of cirrhosis which was discovered after abnormal labs and imaging. ? ?Please has had some intermittent bilateral abdominal to flank pain, more so on the left.  Not clearly associated with meals position or time of day.  This is why she went to the ED recently and she has been taking some Tylenol periodically and says she was prescribed some opioid medicine as well.  She has a history of chronic pain and neuropathy.  She is also had heavy alcohol use daily since about 2014, but recently cut down significantly and says she has had only 1 drink in the last few weeks.  Her mother is with her today and think she would benefit from some outpatient counseling and rehab. ? ?She has no overt GI bleeding, no confusion or falls/gait unsteadiness.  Her abdomen feels bloated and uncomfortable and her bowel habits have been regular.  Denies rectal bleeding. ?ROS: ? ?Review of Systems  ?Constitutional:  Positive for fatigue. Negative for appetite change and unexpected weight change.  ?HENT:  Negative for mouth sores and voice change.   ?Eyes:  Negative for pain and redness.  ?Respiratory:  Negative for cough and shortness of breath.   ?Cardiovascular:  Negative for chest pain and palpitations.  ?Genitourinary:  Negative for dysuria and hematuria.  ?Musculoskeletal:  Negative for arthralgias and myalgias.  ?Skin:  Negative for pallor and rash.  ?Neurological:  Negative for weakness and headaches.  ?     Chronic  neuropathy hands and feet  ?Hematological:  Negative for adenopathy.  ?Psychiatric/Behavioral:    ?     Mood stable, though she has been anxious lately about this diagnosis.  ? ? ?Past Medical History: ?Past Medical History:  ?Diagnosis Date  ? Anxiety   ? Arthritis   ? right foot  ? Cirrhosis ([place])   ? GERD (gastroesophageal reflux disease)   ? History of alcoholism ([place])   ? History of cellulitis   ? 2015  right side face  ? Hypertension   ? followed by pcp  (per pt never had stress test)  ? Malnutrition ([place])   ? Metatarsal fracture   ? right 2nd  ? Peripheral neuropathy   ? feet  ? Strachan's syndrome   ? painful neuropathy  ? Wears dentures   ? upper  ? Wears glasses   ? ? ? ?Past Surgical History: ?Past Surgical History:  ?Procedure Laterality Date  ? BREAST BIOPSY    ? MULTIPLE EXTRACTIONS WITH ALVEOLOPLASTY N/A 12/25/2013  ? Procedure: Extraction of tooth #'s 2,3,4,5,6,8,9,10,11,12,13,14,15,20,21,22,23,24,25,26,27, [phone] with alveoloplasty and bilateral mandibular tori reductions;  Surgeon: Lenn Cal, DDS;  Location: [place];  Service: Oral Surgery;  Laterality: N/A;  ? ORIF TOE FRACTURE Right 07/03/2019  ? Procedure: OPEN REDUCTION INTERNAL FIXATION (ORIF) METATARSAL (TOE) FRACTURE;  Surgeon: Trula Slade, [name];  Location: [place];  Service: Podiatry;  Laterality: Right;  LEG BLOCK  ? SURAL NERVE BX Right 12/24/2013  ? Procedure: SURAL NERVE BIOPSY;  Surgeon: Kristeen Miss, MD;  Location: [facility] NEURO ORS;  Service: Neurosurgery;  Laterality: Right;  ? TUBAL LIGATION Bilateral 2002  ? PPTL  ? ? ? ?Family History: ?Family History  ?Adopted: Yes  ?Problem Relation Age of Onset  ? Diabetes Father   ? Hypertension Mother   ? Raynaud syndrome Mother   ? Diabetes Maternal Aunt   ? Heart disease Maternal Aunt   ? Diabetes Maternal Uncle   ? Heart disease Maternal Uncle   ? Drug abuse Maternal Grandmother   ? Cancer Maternal Grandmother   ?     intestinal  ? Hypertension Maternal  Grandmother   ? Cancer Maternal Grandfather   ?     lung  ? Hypertension Paternal Grandfather   ? Colon cancer Neg Hx   ? Colon polyps Neg Hx   ? Esophageal cancer Neg Hx   ? Rectal cancer Neg Hx   ? Stomach cancer Neg Hx   ? ? ?Social History: ?Social History  ? ?Socioeconomic History  ? Marital status: Widowed  ?  Spouse name: Not on file  ? Number of children: 1  ? Years of education: hs  ? Highest education level: Not on file  ?Occupational History  ? Occupation: food service  ?Tobacco Use  ? Smoking status: Every Day  ?  Packs/day: 0.50  ?  Years: 20.00  ?  Pack years: 10.00  ?  Types: Cigarettes  ? Smokeless tobacco: Never  ?Vaping Use  ? Vaping Use: Never used  ?Substance and Sexual Activity  ? Alcohol use: Not Currently  ?  Comment: 07-02-2019-- per pt 4 drinks per day  ? Drug use: Not Currently  ?  Comment: 07-02-2019  -- per pt last used in age 25s  ? Sexual activity: Yes  ?  Partners: Male  ?  Birth control/protection: Surgical  ?Other Topics Concern  ? Not on file  ?Social History Narrative  ? patient is widowed.  Patient lives with daughter in a 2 story home.   ? Patient works in Ambulance person.   ? Education high school.  ? Right handed.  ? Caffeine sometimes tea.  ? Lives in a one story home  ? ?Social Determinants of Health  ? ?Financial Resource Strain: Not on file  ?Food Insecurity: Not on file  ?Transportation Needs: Not on file  ?Physical Activity: Not on file  ?Stress: Not on file  ?Social Connections: Not on file  ? ?She does seasonal work in concessions at the [employer] ? ?Allergies: ?Allergies  ?Allergen Reactions  ? Macrobid [Nitrofurantoin Macrocrystal] Anaphylaxis and Other (See Comments)  ?  Patient reports 2 syncope episodes after her mouth became numb  ? ? ?Outpatient Meds: ?Current Outpatient Medications  ?Medication Sig Dispense Refill  ? amLODipine (NORVASC) 5 MG tablet Take 1 tablet (5 mg total) by mouth daily. 90 tablet 1  ? busPIRone (BUSPAR) 5 MG tablet Take 1 tablet (5 mg  total) by mouth 2 (two) times daily. 180 tablet 1  ? cloNIDine (CATAPRES) 0.1 MG tablet Take 1 tablet (0.1 mg total) by mouth at bedtime. For hot flashes 90 tablet 3  ? DULoxetine (CYMBALTA) 60 MG capsule Take 1 capsule (60 mg total) by mouth 2 (two) times daily. 180 capsule 1  ? hydrALAZINE (APRESOLINE) 10 MG tablet Take 1 tablet (10 mg total) by mouth 3 (three) times daily. 90 tablet 6  ? Multiple Vitamins-Minerals (MULTIVITAMIN WITH MINERALS) tablet Take 1 tablet by mouth daily.    ?  nortriptyline (PAMELOR) 50 MG capsule Take 1 capsule (50 mg total) by mouth at bedtime. 90 capsule 3  ? omeprazole (PRILOSEC) 20 MG capsule Take 1 capsule (20 mg total) by mouth daily. 90 capsule 1  ? potassium chloride (KLOR-CON M10) 10 MEQ tablet Take 1 tablet (10 mEq total) by mouth daily. 90 tablet 1  ? pregabalin (LYRICA) 150 MG capsule Take 1 capsule (150 mg total) by mouth 3 (three) times daily. 90 capsule 5  ? ?No current facility-administered medications for this visit.  ? ? ? ? ?___________________________________________________________________ ?Objective  ? ?Exam: ? ?BP 108/64   Pulse 80   Ht '5\' 5"'$  (1.651 m)   Wt 128 lb (58.1 kg)   LMP  (LMP Unknown)   BMI 21.30 kg/m?  ?Wt Readings from Last 3 Encounters:  ?12/24/21 128 lb (58.1 kg)  ?12/10/21 127 lb (57.6 kg)  ?12/08/21 133 lb 9.6 oz (60.6 kg)  ? ? ?General: Chronically ill-appearing, poor muscle mass. ?Eyes: sclera anicteric, no redness ?ENT: oral mucosa moist without lesions, no cervical or supraclavicular lymphadenopathy ?CV: RRR without murmur, S1/S2, no JVD, no peripheral edema ?Resp: clear to auscultation bilaterally, normal RR and effort noted ?GI: soft, left lateral CVA/flank tenderness -there is also a lipoma in that area., with active bowel sounds.  Left hepatic lobe enlarged 3 fingerbreadths below the costal margin, more so with inspiration.  No spleen tip palpable ?Skin; warm and dry, no rash or jaundice noted ?Neuro: awake, alert and oriented x 3.  Normal gross motor function and fluent speech ? ?Labs: ? ? ?  Latest Ref Rng & Units 12/08/2021  ?  6:07 PM 08/26/2021  ?  9:21 AM 07/03/2019  ? 12:11 PM  ?CBC  ?WBC 4.0 - 10.5 K/uL 15.8   13.7     ?Hemoglobin 12.0 - 15

## 2021-12-29 LAB — ANTI-SMOOTH MUSCLE ANTIBODY, IGG: Actin (Smooth Muscle) Antibody (IGG): <20 U (ref <20)

## 2021-12-29 LAB — CERULOPLASMIN: Ceruloplasmin: 37 mg/dL (ref 18–53)

## 2021-12-29 LAB — ALPHA-1-ANTITRYPSIN: A-1 Antitrypsin, Ser: 167 mg/dL (ref 83–199)

## 2021-12-29 LAB — ANTI-NUCLEAR AB-TITER (ANA TITER): ANA Titer 1: 1:40 {titer} — ABNORMAL HIGH

## 2021-12-29 LAB — ANA: Anti Nuclear Antibody (ANA): POSITIVE — AB

## 2021-12-29 LAB — MITOCHONDRIAL ANTIBODIES: Mitochondrial M2 Ab, IgG: <=20 U (ref <=20.0)

## 2021-12-29 LAB — IGA: Immunoglobulin A: 497 mg/dL — ABNORMAL HIGH (ref 47–310)

## 2021-12-29 LAB — AFP TUMOR MARKER: AFP-Tumor Marker: 4.1 ng/mL

## 2021-12-29 LAB — TISSUE TRANSGLUTAMINASE, IGA: (tTG) Ab, IgA: <1 U/mL

## 2021-12-31 ENCOUNTER — Encounter: Payer: Self-pay | Admitting: Gastroenterology

## 2022-02-02 ENCOUNTER — Encounter: Payer: Medicaid Other | Admitting: Family Medicine

## 2022-02-16 ENCOUNTER — Ambulatory Visit: Payer: Medicaid Other | Attending: Family Medicine | Admitting: Family Medicine

## 2022-02-16 ENCOUNTER — Encounter: Payer: Self-pay | Admitting: Family Medicine

## 2022-02-16 VITALS — BP 125/77 | HR 77 | Ht 65.0 in | Wt 129.8 lb

## 2022-02-16 DIAGNOSIS — E785 Hyperlipidemia, unspecified: Secondary | ICD-10-CM | POA: Insufficient documentation

## 2022-02-16 DIAGNOSIS — K219 Gastro-esophageal reflux disease without esophagitis: Secondary | ICD-10-CM

## 2022-02-16 DIAGNOSIS — Z23 Encounter for immunization: Secondary | ICD-10-CM

## 2022-02-16 DIAGNOSIS — E876 Hypokalemia: Secondary | ICD-10-CM

## 2022-02-16 DIAGNOSIS — E782 Mixed hyperlipidemia: Secondary | ICD-10-CM

## 2022-02-16 DIAGNOSIS — F419 Anxiety disorder, unspecified: Secondary | ICD-10-CM

## 2022-02-16 DIAGNOSIS — K7030 Alcoholic cirrhosis of liver without ascites: Secondary | ICD-10-CM

## 2022-02-16 DIAGNOSIS — G6289 Other specified polyneuropathies: Secondary | ICD-10-CM | POA: Diagnosis not present

## 2022-02-16 DIAGNOSIS — F1010 Alcohol abuse, uncomplicated: Secondary | ICD-10-CM

## 2022-02-16 DIAGNOSIS — I10 Essential (primary) hypertension: Secondary | ICD-10-CM | POA: Diagnosis not present

## 2022-02-16 DIAGNOSIS — R1011 Right upper quadrant pain: Secondary | ICD-10-CM

## 2022-02-16 DIAGNOSIS — F32A Depression, unspecified: Secondary | ICD-10-CM

## 2022-02-16 MED ORDER — AMLODIPINE BESYLATE 5 MG PO TABS: 5.0000 mg | ORAL_TABLET | Freq: Every day | ORAL | 1 refills | Status: DC

## 2022-02-16 MED ORDER — NALTREXONE HCL 50 MG PO TABS: 50.0000 mg | ORAL_TABLET | Freq: Every day | ORAL | 3 refills | Status: DC

## 2022-02-16 MED ORDER — BUSPIRONE HCL 5 MG PO TABS: 5.0000 mg | ORAL_TABLET | Freq: Two times a day (BID) | ORAL | 1 refills | Status: DC

## 2022-02-16 MED ORDER — OMEPRAZOLE 20 MG PO CPDR: 20.0000 mg | DELAYED_RELEASE_CAPSULE | Freq: Every day | ORAL | 1 refills | Status: DC

## 2022-02-16 MED ORDER — LIDOCAINE 5 % EX PTCH: 1.0000 | MEDICATED_PATCH | CUTANEOUS | 1 refills | Status: DC

## 2022-02-16 MED ORDER — CLONIDINE HCL 0.1 MG PO TABS: 0.1000 mg | ORAL_TABLET | Freq: Every day | ORAL | 1 refills | Status: DC

## 2022-02-16 MED ORDER — HYDRALAZINE HCL 10 MG PO TABS: 10.0000 mg | ORAL_TABLET | Freq: Three times a day (TID) | ORAL | 1 refills | Status: DC

## 2022-02-16 MED ORDER — DULOXETINE HCL 60 MG PO CPEP: 60.0000 mg | ORAL_CAPSULE | Freq: Two times a day (BID) | ORAL | 1 refills | Status: DC

## 2022-02-16 NOTE — Progress Notes (Signed)
Right side abdominal pain.

## 2022-02-16 NOTE — Addendum Note (Signed)
Addended by: Gomez Cleverly on: 02/16/2022 04:36 PM   Modules accepted: Orders

## 2022-02-16 NOTE — Progress Notes (Signed)
Subjective:  Patient ID: [name], female    DOB: June 28, 1971  Age: 51 y.o. MRN: [id number]  CC: Annual Exam   HPI [name] is a 50 y.o. 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  Interval History: She complains of abdominal pain that span from her epigastric to RUQ which she has had for the last 3 months. She is on PPI and has not had any reflux symptoms of GERD while on it. She states pain is a 7/10 and it waxes and wanes It bothers her a lot while at work and she has to lean on something for relief. She has diarrhea but no nausea/ vomiting/ constipation. Diarrhea has been present for months.  Seen by GI 2 months ago for management of cirrhosis and notes reviewed.  She has an upcoming appointment later this month.  She continues to consume alcohol but states she has cut back and just consumed a couple beers on the weekend. She is interested in receiving medication to help decrease her craving and also interested in referral for counseling.  She is doing well on her antihypertensive.  Her neuropathy is stable on Cymbalta, Lyrica and she is on Pamelor which is prescribed by Neurology. Past Medical History:  Diagnosis Date   Anxiety    Arthritis    right foot   Cirrhosis ([place])    GERD (gastroesophageal reflux disease)    History of alcoholism ([place])    History of cellulitis    2015  right side face   Hypertension    followed by pcp  (per pt never had stress test)   Malnutrition ([place])    Metatarsal fracture    right 2nd   Peripheral neuropathy    feet   Strachan's syndrome    painful neuropathy   Wears dentures    upper   Wears glasses     Past Surgical History:  Procedure Laterality Date   BREAST BIOPSY     MULTIPLE EXTRACTIONS WITH ALVEOLOPLASTY N/A 12/25/2013   Procedure: Extraction of tooth #'s  2,3,4,5,6,8,9,10,11,12,13,14,15,20,21,22,23,24,25,26,27, 01,75,10,25 with alveoloplasty and bilateral mandibular tori reductions;  Surgeon: Lenn Cal, DDS;  Location: [place];  Service: Oral Surgery;  Laterality: N/A;   ORIF TOE FRACTURE Right 07/03/2019   Procedure: OPEN REDUCTION INTERNAL FIXATION (ORIF) METATARSAL (TOE) FRACTURE;  Surgeon: Trula Slade, [name];  Location: [place];  Service: Podiatry;  Laterality: Right;  LEG BLOCK   SURAL NERVE BX Right 12/24/2013   Procedure: SURAL NERVE BIOPSY;  Surgeon: Kristeen Miss, MD;  Location: [place] NEURO ORS;  Service: Neurosurgery;  Laterality: Right;   TUBAL LIGATION Bilateral 2002   PPTL    Family History  Adopted: Yes  Problem Relation Age of Onset   Diabetes Father    Hypertension Mother    Raynaud syndrome Mother    Diabetes Maternal Aunt    Heart disease Maternal Aunt    Diabetes Maternal Uncle    Heart disease Maternal Uncle    Drug abuse Maternal Grandmother    Cancer Maternal Grandmother        intestinal   Hypertension Maternal Grandmother    Cancer Maternal Grandfather        lung   Hypertension Paternal Grandfather    Colon cancer Neg Hx    Colon polyps Neg Hx    Esophageal cancer Neg Hx    Rectal cancer Neg  Hx    Stomach cancer Neg Hx     Social History   Socioeconomic History   Marital status: Widowed    Spouse name: Not on file   Number of children: 1   Years of education: hs   Highest education level: Not on file  Occupational History   Occupation: food service  Tobacco Use   Smoking status: Every Day    Packs/day: 0.50    Years: 20.00    Pack years: 10.00    Types: Cigarettes   Smokeless tobacco: Never  Vaping Use   Vaping Use: Never used  Substance and Sexual Activity   Alcohol use: Not Currently    Comment: 07-02-2019-- per pt 4 drinks per day   Drug use: Not Currently    Comment: 07-02-2019  -- per pt last used in age 1s   Sexual activity: Yes    Partners: Male     Birth control/protection: Surgical  Other Topics Concern   Not on file  Social History Narrative   patient is widowed.  Patient lives with daughter in a 2 story home.    Patient works in Ambulance person.    Education high school.   Right handed.   Caffeine sometimes tea.   Lives in a one story home   Social Determinants of Health   Financial Resource Strain: Not on file  Food Insecurity: Not on file  Transportation Needs: Not on file  Physical Activity: Not on file  Stress: Not on file  Social Connections: Not on file    Allergies  Allergen Reactions   Macrobid [Nitrofurantoin Macrocrystal] Anaphylaxis and Other (See Comments)    Patient reports 2 syncope episodes after her mouth became numb    Outpatient Medications Prior to Visit  Medication Sig Dispense Refill   Multiple Vitamins-Minerals (MULTIVITAMIN WITH MINERALS) tablet Take 1 tablet by mouth daily.     nortriptyline (PAMELOR) 50 MG capsule Take 1 capsule (50 mg total) by mouth at bedtime. 90 capsule 3   potassium chloride (KLOR-CON M10) 10 MEQ tablet Take 1 tablet (10 mEq total) by mouth daily. 90 tablet 1   pregabalin (LYRICA) 150 MG capsule Take 1 capsule (150 mg total) by mouth 3 (three) times daily. 90 capsule 5   amLODipine (NORVASC) 5 MG tablet Take 1 tablet (5 mg total) by mouth daily. 90 tablet 1   busPIRone (BUSPAR) 5 MG tablet Take 1 tablet (5 mg total) by mouth 2 (two) times daily. 180 tablet 1   cloNIDine (CATAPRES) 0.1 MG tablet Take 1 tablet (0.1 mg total) by mouth at bedtime. For hot flashes 90 tablet 3   DULoxetine (CYMBALTA) 60 MG capsule Take 1 capsule (60 mg total) by mouth 2 (two) times daily. 180 capsule 1   hydrALAZINE (APRESOLINE) 10 MG tablet Take 1 tablet (10 mg total) by mouth 3 (three) times daily. 90 tablet 6   omeprazole (PRILOSEC) 20 MG capsule Take 1 capsule (20 mg total) by mouth daily. 90 capsule 1   No facility-administered medications prior to visit.     ROS Review of Systems   Constitutional:  Negative for activity change, appetite change and fatigue.  HENT:  Negative for congestion, sinus pressure and sore throat.   Eyes:  Negative for visual disturbance.  Respiratory:  Negative for cough, chest tightness, shortness of breath and wheezing.   Cardiovascular:  Negative for chest pain and palpitations.  Gastrointestinal:  Positive for abdominal pain. Negative for abdominal distention and constipation.  Endocrine: Negative for  polydipsia.  Genitourinary:  Negative for dysuria and frequency.  Musculoskeletal:  Negative for arthralgias and back pain.  Skin:  Negative for rash.  Neurological:  Negative for tremors, light-headedness and numbness.  Hematological:  Does not bruise/bleed easily.  Psychiatric/Behavioral:  Negative for agitation and behavioral problems.    Objective:  BP 125/77   Pulse 77   Ht '5\' 5"'$  (1.651 m)   Wt 129 lb 12.8 oz (58.9 kg)   LMP  (LMP Unknown)   SpO2 95%   BMI 21.60 kg/m      02/16/2022    2:57 PM 12/24/2021    2:56 PM 12/10/2021    2:32 PM  BP/Weight  Systolic BP 416 384 536  Diastolic BP 77 64 84  Wt. (Lbs) 129.8 128 127  BMI 21.6 kg/m2 21.3 kg/m2 21.13 kg/m2      Physical Exam Constitutional:      Appearance: She is well-developed.  Cardiovascular:     Rate and Rhythm: Normal rate.     Heart sounds: Normal heart sounds. No murmur heard. Pulmonary:     Effort: Pulmonary effort is normal.     Breath sounds: Normal breath sounds. No wheezing or rales.  Chest:     Chest wall: No tenderness.  Abdominal:     General: Bowel sounds are normal. There is no distension.     Palpations: Abdomen is soft. There is no mass.     Tenderness: There is abdominal tenderness (slight epigastric and RUQ TTTP, negative murphy's sign).  Musculoskeletal:        General: Normal range of motion.     Right lower leg: No edema.     Left lower leg: No edema.  Neurological:     Mental Status: She is alert and oriented to person, place, and  time.  Psychiatric:        Mood and Affect: Mood normal.       Latest Ref Rng & Units 12/08/2021    6:07 PM 08/26/2021    9:21 AM 06/24/2020    2:08 PM  CMP  Glucose 70 - 99 mg/dL 114   123   129    BUN 6 - 20 mg/dL '5   4   5    '$ Creatinine 0.44 - 1.00 mg/dL 0.62   0.71   0.67    Sodium 135 - 145 mmol/L 137   139   140    Potassium 3.5 - 5.1 mmol/L 3.3   4.5   3.6    Chloride 98 - 111 mmol/L 94   91   96    CO2 22 - 32 mmol/L '31   30   25    '$ Calcium 8.9 - 10.3 mg/dL 9.3   9.1   9.1    Total Protein 6.5 - 8.1 g/dL 7.3   7.3   7.4    Total Bilirubin 0.3 - 1.2 mg/dL 0.9   0.6   0.4    Alkaline Phos 38 - 126 U/L 362   402   209    AST 15 - 41 U/L 155   134   33    ALT 0 - 44 U/L 37   49   18      Lipid Panel     Component Value Date/Time   CHOL 215 (H) 08/26/2021 0921   TRIG 182 (H) 08/26/2021 0921   HDL 46 08/26/2021 0921   CHOLHDL 5.5 (H) 06/24/2020 1408   CHOLHDL 4.0 07/19/2016 1238  VLDL 26 07/19/2016 1238   LDLCALC 136 (H) 08/26/2021 0921    CBC    Component Value Date/Time   WBC 15.8 (H) 12/08/2021 1807   RBC 4.18 12/08/2021 1807   HGB 14.9 12/08/2021 1807   HGB 14.1 08/26/2021 0921   HCT 44.7 12/08/2021 1807   HCT 40.7 08/26/2021 0921   PLT 222 12/08/2021 1807   PLT 199 08/26/2021 0921   MCV 106.9 (H) 12/08/2021 1807   MCV 102 (H) 08/26/2021 0921   MCH 35.6 (H) 12/08/2021 1807   MCHC 33.3 12/08/2021 1807   RDW 13.9 12/08/2021 1807   RDW 11.8 08/26/2021 0921   LYMPHSABS 3.1 12/08/2021 1807   LYMPHSABS 2.7 08/26/2021 0921   MONOABS 1.1 (H) 12/08/2021 1807   EOSABS 0.2 12/08/2021 1807   EOSABS 0.2 08/26/2021 0921   BASOSABS 0.1 12/08/2021 1807   BASOSABS 0.1 08/26/2021 0921    Lab Results  Component Value Date   HGBA1C 5.3 08/31/2021    The 10-year ASCVD risk score (Arnett DK, et al., 2019) is: 6.2%   Values used to calculate the score:     Age: 3 years     Sex: Female     Is Non-Hispanic African American: No     Diabetic: No     Tobacco  smoker: Yes     Systolic Blood Pressure: 378 mmHg     Is BP treated: Yes     HDL Cholesterol: 46 mg/dL     Total Cholesterol: 215 mg/dL  Assessment & Plan:  1. Hypokalemia Last potassium was 3.3 She is currently on potassium chloride We will check potassium level and adjust regimen accordingly - Basic Metabolic Panel  2. Essential hypertension Controlled Counseled on blood pressure goal of less than 130/80, low-sodium, DASH diet, medication compliance, 150 minutes of moderate intensity exercise per week. Discussed medication compliance, adverse effects. - amLODipine (NORVASC) 5 MG tablet; Take 1 tablet (5 mg total) by mouth daily.  Dispense: 90 tablet; Refill: 1 - hydrALAZINE (APRESOLINE) 10 MG tablet; Take 1 tablet (10 mg total) by mouth 3 (three) times daily.  Dispense: 270 tablet; Refill: 1  3. Other polyneuropathy Stable - DULoxetine (CYMBALTA) 60 MG capsule; Take 1 capsule (60 mg total) by mouth 2 (two) times daily.  Dispense: 180 capsule; Refill: 1  4. Gastroesophageal reflux disease without esophagitis Controlled - omeprazole (PRILOSEC) 20 MG capsule; Take 1 capsule (20 mg total) by mouth daily.  Dispense: 90 capsule; Refill: 1  5. Anxiety and depression Stable - busPIRone (BUSPAR) 5 MG tablet; Take 1 tablet (5 mg total) by mouth 2 (two) times daily.  Dispense: 180 tablet; Refill: 1  6. Right upper quadrant abdominal pain CT abdomen was negative for gallbladder disease She does have hepatomegaly and this could be due to stretching of visceral pleura She is currently using ibuprofen with no much relief Declined use of muscle relaxant due to sedation We will add on Lidoderm patch She may benefit from upper endoscopy given epigastric pain as well to exclude presence of peptic ulcer - lidocaine (LIDODERM) 5 %; Place 1 patch onto the skin daily. Remove & Discard patch within 12 hours or as directed by MD  Dispense: 30 patch; Refill: 1  7. Alcoholic cirrhosis of liver  without ascites ([place]) Counseled on quitting Closely followed by GI  8. Alcohol abuse Provided resources for alcohol Anonymous - naltrexone (DEPADE) 50 MG tablet; Take 1 tablet (50 mg total) by mouth daily.  Dispense: 30 tablet; Refill: 3  9.  Mixed hyperlipidemia Uncontrolled Not currently on statin due to the fact that she has elevated LFTs She will work on lifestyle modifications and cutting out high cholesterol foods which she is fond of eating out of work   Meds ordered this encounter  Medications   lidocaine (LIDODERM) 5 %    Sig: Place 1 patch onto the skin daily. Remove & Discard patch within 12 hours or as directed by MD    Dispense:  30 patch    Refill:  1   naltrexone (DEPADE) 50 MG tablet    Sig: Take 1 tablet (50 mg total) by mouth daily.    Dispense:  30 tablet    Refill:  3   cloNIDine (CATAPRES) 0.1 MG tablet    Sig: Take 1 tablet (0.1 mg total) by mouth at bedtime. For hot flashes    Dispense:  90 tablet    Refill:  1   amLODipine (NORVASC) 5 MG tablet    Sig: Take 1 tablet (5 mg total) by mouth daily.    Dispense:  90 tablet    Refill:  1   DULoxetine (CYMBALTA) 60 MG capsule    Sig: Take 1 capsule (60 mg total) by mouth 2 (two) times daily.    Dispense:  180 capsule    Refill:  1    DX Code Needed  .   hydrALAZINE (APRESOLINE) 10 MG tablet    Sig: Take 1 tablet (10 mg total) by mouth 3 (three) times daily.    Dispense:  270 tablet    Refill:  1   omeprazole (PRILOSEC) 20 MG capsule    Sig: Take 1 capsule (20 mg total) by mouth daily.    Dispense:  90 capsule    Refill:  1   busPIRone (BUSPAR) 5 MG tablet    Sig: Take 1 tablet (5 mg total) by mouth 2 (two) times daily.    Dispense:  180 tablet    Refill:  1    Follow-up: No follow-ups on file.       Charlott Rakes, MD, FAAFP. [facility], [place]   02/16/2022, 3:54 PM

## 2022-02-16 NOTE — Patient Instructions (Addendum)
[email or web address]  - for Alcohol Anonymous        1.[facility] [address], [place], [place] [address] [phone] or [phone] Walk-in urgent care 24/7 for anyone  For [facility] ONLY New patient assessments and therapy walk-ins: Monday and Wednesday 8am-11am First and second Friday 1pm-5pm New patient psychiatry and medication management walk-ins: Mondays, Wednesdays, Thursdays, Fridays 8am-11am No psychiatry walk-ins on Tuesday   *Accepts all insurance and uninsured for Urgent Care needs *Accepts Medicaid and uninsured for outpatient treatment   [facility] (Therapy and psychiatry) [facility] (near [place]) [address], [place], [place] [address] [phone] Fax: [phone] ([place])   [facility]  [address] [phone] Call for appointment  [facility] (Therapy only)  [facility] [address] [place], [place] [address] Monday - Friday: 8:30 a.m.-12 p.m. / 1 p.m.-2:30 p.m.  The [facility] [address] [place], [place] [address] Monday-Friday: 8:30 a.m.-12 p.m. / 2-3:30 p.m. (INSURANCE REQUIRED -MEDICAID ACCEPTED) They do offer a sliding fee scale $20-$30/session   [facility] [place], [place] [address] Phone: [place] [address] [place] [address]  Phone: [phone] (Does not accept Medicaid) (only one provider accepts Medicare) [facility] [address] (at [employer]) [place], [place] [phone] (Accepts Medicaid and Medicare)  [facility]  [place] [address]  [place], [place] [address]  Phone: [phone]  [address] [place], [place] [address] Phone: [phone] [facility] Medicaid) [facility] (Therapy only)  [address] [place], [place] [address] Phone: [phone]   [facility] (Therapy only)  [place], [place] [address] Phone: [phone] [facility])   [place] [address], [place], [place] [address] Phone: [phone] ([place]) [employer] [phone] [address] [place], [place] [address] Phone: [phone] [facility]) [facility] (Psychiatry only)  [phone] [address], [place], [place] [address] (Accepts Medicaid and Medicare) [facility] (Psychiatry and therapy)  [place], [place], [place] [address] [phone] [facility] Medicare) [place] [facility] (psychiatry and therapy) [address], [place], [place] [address] [phone]  [facility] [address], [place], [place], [place] [address] [phone] Accept [address], [place], [place], [place], [place],  and the following types of Medicaid; Alliance, Parker[employer] Partners, [place], [place], [employer], Healthy [place], [place] Complete, and [place], as well as offering a Manufacturing systems engineer and private payment options. Provides In-Office Appointments, Virtual Appointments, and Phone Consultations Offers medication management for ages 72 years old and up, including,  Medication Management for Suboxone and [place] [phone] [address], [place], [place] [address] ([place] Medicaid and Medicare)         19.  [facility] (therapy only)  [address] [place], [place] [address]            [phone] (Accepts medicare) 20. [facility]  (Suboxone and methodone) [phone] [address], [place], [place] [address] To Be Eligible for Opioid Treatment at [facility] you must be at  least 51 years of age  you have already tried other interventions that were not successful such as opioid detox, inpatient rehab for opioids, or outpatient counseling specifically for opioid dependency your [facility] drug test must be completely free of benzodiazepines (klonopin, xanax, valium, ativan, or other benz) you have reliable transportation to the [facility] clinic in [place] you recognize that counseling is a critical component of [facility]' Opioid Program and you agree to attend all required counseling sessions you are committed to total drug abstinence and will conscientiously strive to remain free of alcohol, marijuana, and other illicit substances while in treatment you desire a peaceful treatment atmosphere in which personal responsibility and respect toward staff and clients is the norm   21. [facility] [place], [place], [place] [address] Offers SAIOP (Substance Abuse Intensive Outpatient Program) [phone] 22. [facility] [address] [place], [place] [address] [phone] (Accepts medicare)  For those who are tech savvy, go on psychology today, type in your local city (i.e. [place]. ) and specify your insurance at the top of the screen after you search. (Medicaid if needed). You can also specify whether you are interested in therapy and psychiatry.  [email or web address]/us

## 2022-02-17 ENCOUNTER — Other Ambulatory Visit: Payer: Self-pay | Admitting: Family Medicine

## 2022-02-17 ENCOUNTER — Telehealth: Payer: Self-pay

## 2022-02-17 LAB — BASIC METABOLIC PANEL
BUN/Creatinine Ratio: 7 — ABNORMAL LOW (ref 9–23)
BUN: 4 mg/dL — ABNORMAL LOW (ref 6–24)
CO2: 28 mmol/L (ref 20–29)
Calcium: 9.2 mg/dL (ref 8.7–10.2)
Chloride: 91 mmol/L — ABNORMAL LOW (ref 96–106)
Creatinine, Ser: 0.58 mg/dL (ref 0.57–1.00)
Glucose: 91 mg/dL (ref 70–99)
Potassium: 3.9 mmol/L (ref 3.5–5.2)
Sodium: 134 mmol/L (ref 134–144)
eGFR: 110 mL/min/{1.73_m2} (ref >59)

## 2022-02-17 MED ORDER — POTASSIUM CHLORIDE CRYS ER 10 MEQ PO TBCR: 10.0000 meq | EXTENDED_RELEASE_TABLET | Freq: Every day | ORAL | 1 refills | Status: DC

## 2022-02-17 NOTE — Telephone Encounter (Signed)
Lidocaine 5% PA denied, medical criteria not met:

## 2022-02-18 ENCOUNTER — Encounter: Payer: Self-pay | Admitting: Family Medicine

## 2022-02-21 ENCOUNTER — Other Ambulatory Visit: Payer: Self-pay | Admitting: Family Medicine

## 2022-02-21 MED ORDER — MELOXICAM 7.5 MG PO TABS: 7.5000 mg | ORAL_TABLET | Freq: Every day | ORAL | 0 refills | Status: DC

## 2022-02-22 ENCOUNTER — Encounter: Payer: Self-pay | Admitting: Gastroenterology

## 2022-02-22 ENCOUNTER — Ambulatory Visit: Payer: Medicaid Other | Admitting: Gastroenterology

## 2022-02-22 VITALS — BP 110/64 | HR 68 | Resp 16 | Ht 64.0 in | Wt 129.2 lb

## 2022-02-22 DIAGNOSIS — R1010 Upper abdominal pain, unspecified: Secondary | ICD-10-CM

## 2022-02-22 DIAGNOSIS — K7030 Alcoholic cirrhosis of liver without ascites: Secondary | ICD-10-CM

## 2022-02-22 NOTE — Progress Notes (Signed)
[facility] progress note:  History: ANAIYA WISINSKI 02/22/2022  Referring provider: Charlott Rakes, MD  Reason for consult/chief complaint: Follow-up, Cirrhosis, and Abdominal Pain   Subjective  HPI: Elsi follows up for her alcohol related cirrhosis with significantly elevated LFTs.  No encephalopathy, ascites or GI bleeding.  From primary care office note 02/16/2022: "She complains of abdominal pain that span from her epigastric to RUQ which she has had for the last 3 months. She is on PPI and has not had any reflux symptoms of GERD while on it. She states pain is a 7/10 and it waxes and wanes It bothers her a lot while at work and she has to lean on something for relief. She has diarrhea but no nausea/ vomiting/ constipation. Diarrhea has been present for months.   Seen by GI 2 months ago for management of cirrhosis and notes reviewed.  She has an upcoming appointment later this month.  She continues to consume alcohol but states she has cut back and just consumed a couple beers on the weekend. She is interested in receiving medication to help decrease her craving and also interested in referral for counseling.   She is doing well on her antihypertensive.  Her neuropathy is stable on Cymbalta, Lyrica and she is on Pamelor which is prescribed by Neurology."  Please over-the-counter by her daughter today and has ongoing abdominal pain that is primarily epigastric and right upper quadrant, sometimes lower.  She denies rectal bleeding, dysphagia nausea or vomiting. Primary care prescribed meloxicam, but Kyrene had not yet picked it up after doing some reading leading to concerns about its possible side effects considering her liver condition.  She has decreased but not completely stop drinking alcohol.  She finds it difficult because the boyfriend with whom she lives also drinks alcohol despite his heart condition and her urging to cut back.  She feels that she is not able  to put alcohol with her efforts thus far, but will start naltrexone prescribed by primary care to help curb the cravings.  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 ROS:  Denies chest pain dyspnea or dysuria Chronic neuropathy pain (last neurology visit March 31) Remainder of systems negative except as above   Past Medical History: Past Medical History:  Diagnosis Date   Anxiety    Arthritis    right foot   Cirrhosis ([place])    GERD (gastroesophageal reflux disease)    History of alcoholism ([place])    History of cellulitis    2015  right side face   Hypertension    followed by pcp  (per pt never had stress test)   Malnutrition ([place])    Metatarsal fracture    right 2nd   Peripheral neuropathy    feet   Strachan's syndrome    painful neuropathy   Wears dentures    upper   Wears glasses      Past Surgical History: Past Surgical History:  Procedure Laterality Date   BREAST BIOPSY     MULTIPLE EXTRACTIONS WITH ALVEOLOPLASTY N/A 12/25/2013   Procedure: Extraction of tooth #'s 2,3,4,5,6,8,9,10,11,12,13,14,15,20,21,22,23,24,25,26,27, 56,43,32,95 with alveoloplasty and bilateral mandibular tori reductions;  Surgeon: Lenn Cal, DDS;  Location: [facility];  Service: Oral Surgery;  Laterality: N/A;   ORIF TOE FRACTURE Right 07/03/2019   Procedure: OPEN REDUCTION INTERNAL FIXATION (ORIF) METATARSAL (TOE) FRACTURE;  Surgeon: Trula Slade, [name];  Location: [facility];  Service: Podiatry;  Laterality: Right;  LEG BLOCK   SURAL NERVE BX Right 12/24/2013   Procedure: SURAL NERVE BIOPSY;  Surgeon: Kristeen Miss, MD;  Location: [facility];  Service: Neurosurgery;  Laterality: Right;   TUBAL LIGATION Bilateral 2002   PPTL     Family History: Family History  Adopted: Yes  Problem Relation Age of Onset   Diabetes Father    Hypertension Mother    Raynaud syndrome Mother    Diabetes Maternal Aunt    Heart disease  Maternal Aunt    Diabetes Maternal Uncle    Heart disease Maternal Uncle    Drug abuse Maternal Grandmother    Cancer Maternal Grandmother        intestinal   Hypertension Maternal Grandmother    Cancer Maternal Grandfather        lung   Hypertension Paternal Grandfather    Colon cancer Neg Hx    Colon polyps Neg Hx    Esophageal cancer Neg Hx    Rectal cancer Neg Hx    Stomach cancer Neg Hx     Social History: Social History   Socioeconomic History   Marital status: Widowed    Spouse name: Not on file   Number of children: 1   Years of education: hs   Highest education level: Not on file  Occupational History   Occupation: food service  Tobacco Use   Smoking status: Every Day    Packs/day: 0.50    Years: 20.00    Total pack years: 10.00    Types: Cigarettes   Smokeless tobacco: Never  Vaping Use   Vaping Use: Never used  Substance and Sexual Activity   Alcohol use: Not Currently    Comment: 07-02-2019-- per pt 4 drinks per day   Drug use: Not Currently    Comment: 07-02-2019  -- per pt last used in age 21s   Sexual activity: Yes    Partners: Male    Birth control/protection: Surgical  Other Topics Concern   Not on file  Social History Narrative   patient is widowed.  Patient lives with daughter in a 2 story home.    Patient works in Ambulance person.    Education high school.   Right handed.   Caffeine sometimes tea.   Lives in a one story home   Social Determinants of Health   Financial Resource Strain: Not on file  Food Insecurity: Not on file  Transportation Needs: Not on file  Physical Activity: Not on file  Stress: Not on file  Social Connections: Not on file    Allergies: Allergies  Allergen Reactions   Macrobid [Nitrofurantoin Macrocrystal] Anaphylaxis and Other (See Comments)    Patient reports 2 syncope episodes after her mouth became numb    Outpatient Meds: Current Outpatient Medications  Medication Sig Dispense Refill   amLODipine  (NORVASC) 5 MG tablet Take 1 tablet (5 mg total) by mouth daily. 90 tablet 1   busPIRone (BUSPAR) 5 MG tablet Take 1 tablet (5 mg total) by mouth 2 (two) times daily. 180 tablet 1   cloNIDine (CATAPRES) 0.1 MG tablet Take 1 tablet (0.1 mg total) by mouth at bedtime. For hot flashes 90 tablet 1   DULoxetine (CYMBALTA) 60 MG capsule Take 1 capsule (60 mg total) by mouth 2 (two) times daily. 180 capsule 1   hydrALAZINE (APRESOLINE) 10 MG tablet Take 1 tablet (10 mg total) by mouth 3 (three) times daily. 270 tablet 1   lidocaine (LIDODERM)  5 % Place 1 patch onto the skin daily. Remove & Discard patch within 12 hours or as directed by MD 30 patch 1   meloxicam (MOBIC) 7.5 MG tablet Take 1 tablet (7.5 mg total) by mouth daily. 30 tablet 0   Multiple Vitamins-Minerals (MULTIVITAMIN WITH MINERALS) tablet Take 1 tablet by mouth daily.     naltrexone (DEPADE) 50 MG tablet Take 1 tablet (50 mg total) by mouth daily. 30 tablet 3   nortriptyline (PAMELOR) 50 MG capsule Take 1 capsule (50 mg total) by mouth at bedtime. 90 capsule 3   omeprazole (PRILOSEC) 20 MG capsule Take 1 capsule (20 mg total) by mouth daily. 90 capsule 1   potassium chloride (KLOR-CON M10) 10 MEQ tablet Take 1 tablet (10 mEq total) by mouth daily. 90 tablet 1   pregabalin (LYRICA) 150 MG capsule Take 1 capsule (150 mg total) by mouth 3 (three) times daily. 90 capsule 5   No current facility-administered medications for this visit.      ___________________________________________________________________ Objective   Exam:  BP 110/64 (BP Location: Left Arm, Patient Position: Sitting, Cuff Size: Normal)   Pulse 68   Resp 16   Ht '5\' 4"'$  (1.626 m)   Wt 129 lb 3.2 oz (58.6 kg)   LMP  (LMP Unknown)   SpO2 99%   BMI 22.18 kg/m  Wt Readings from Last 3 Encounters:  02/22/22 129 lb 3.2 oz (58.6 kg)  02/16/22 129 lb 12.8 oz (58.9 kg)  12/24/21 128 lb (58.1 kg)  Repeat SPO2 on room air with good pulse pick up read 99% Daughter present  for entire visit General: Chronically ill-appearing.  Pleasant and anxious Eyes: sclera anicteric, no redness ENT: oral mucosa moist without lesions, no cervical or supraclavicular lymphadenopathy CV: Regular without murmur, no JVD, no peripheral edema Resp: clear to auscultation bilaterally, normal RR and effort noted GI: soft, right upper quadrant tenderness, with active bowel sounds.  Left lobe of the liver enlarged as before Skin; warm and dry, no rash or jaundice noted Neuro: awake, alert and oriented x 3. Normal gross motor function and fluent speech Steady gait, no asterixis Labs:  Iron/TIBC/Ferritin/ %Sat    Component Value Date/Time   IRON 57 12/24/2021 1556   TIBC 340.2 12/24/2021 1556   FERRITIN 58.8 12/24/2021 1556   IRONPCTSAT 16.8 (L) 12/24/2021 1556   Ceruloplasmin and alpha-1 antitrypsin normal Negative antimitochondrial antibody Normal TTG IgA antibody, elevated total IgA level  Lab Results  Component Value Date   INR 1.1 (H) 12/24/2021   INR 1.06 12/19/2013   Last AFP normal at 4.1 on 12/24/2021     Latest Ref Rng & Units 02/16/2022    4:01 PM 12/08/2021    6:07 PM 08/26/2021    9:21 AM  CMP  Glucose 70 - 99 mg/dL 91  114  123   BUN 6 - 24 mg/dL 4  <5  4   Creatinine 0.57 - 1.00 mg/dL 0.58  0.62  0.71   Sodium 134 - 144 mmol/L 134  137  139   Potassium 3.5 - 5.2 mmol/L 3.9  3.3  4.5   Chloride 96 - 106 mmol/L 91  94  91   CO2 20 - 29 mmol/L '28  31  30   '$ Calcium 8.7 - 10.2 mg/dL 9.2  9.3  9.1   Total Protein 6.5 - 8.1 g/dL  7.3  7.3   Total Bilirubin 0.3 - 1.2 mg/dL  0.9  0.6   Alkaline Phos 38 -  126 U/L  362  402   AST 15 - 41 U/L  155  134   ALT 0 - 44 U/L  37  49      Radiologic Studies:  Last RUQ [place] Feb 2023 (referenced in last office note)  Assessment: Encounter Diagnoses  Name Primary?   Alcoholic cirrhosis of liver without ascites (HCC) Yes   Upper abdominal pain     Cirrhosis from alcohol abuse with chronic upper abdominal pain from  hepatomegaly leading to capsular distention.  Coexisting chronic pain syndrome.  I am concerned that NSAIDs will not only fail to control this type of pain but put her at increased risk for GI bleeding, and I recommended she not take the meloxicam.  I am not certain what medicines may be safe and effective to control this pain, and I would recommend against use of opioids in a patient with alcohol dependency.  Yaquelin needs an upper endoscopy to evaluate her upper abdominal pain and rule out ulcers and H. pylori, neoplasia and screen for esophageal and gastric varices.  She was agreeable after discussion of procedure and risks.  The benefits and risks of the planned procedure were described in detail with the patient or (when appropriate) their health care proxy.  Risks were outlined as including, but not limited to, bleeding, infection, perforation, adverse medication reaction leading to cardiac or pulmonary decompensation, pancreatitis (if ERCP).  The limitation of incomplete mucosal visualization was also discussed.  No guarantees or warranties were given.  Nelida Meuse III  CC: Primary care provider

## 2022-02-22 NOTE — Patient Instructions (Signed)
If you are age 51 or older, your body mass index should be between 23-30. Your Body mass index is 22.18 kg/m. If this is out of the aforementioned range listed, please consider follow up with your Primary Care Provider.  If you are age 56 or younger, your body mass index should be between 19-25. Your Body mass index is 22.18 kg/m. If this is out of the aformentioned range listed, please consider follow up with your Primary Care Provider.   ________________________________________________________  The [facility] providers would like to encourage you to use [facility] to communicate with providers for non-urgent requests or questions.  Due to long hold times on the telephone, sending your provider a message by [facility] may be a faster and more efficient way to get a response.  Please allow 48 business hours for a response.  Please remember that this is for non-urgent requests.  _______________________________________________________  Dennis Bast have been scheduled for an endoscopy. Please follow written instructions given to you at your visit today. If you use inhalers (even only as needed), please bring them with you on the day of your procedure.  Due to recent changes in healthcare laws, you may see the results of your imaging and laboratory studies on MyChart before your provider has had a chance to review them.  We understand that in some cases there may be results that are confusing or concerning to you. Not all laboratory results come back in the same time frame and the provider may be waiting for multiple results in order to interpret others.  Please give [place] 48 hours in order for your provider to thoroughly review all the results before contacting the office for clarification of your results.   It was a pleasure to see you today!  Thank you for trusting me with your gastrointestinal care!

## 2022-02-24 ENCOUNTER — Encounter: Payer: Self-pay | Admitting: Gastroenterology

## 2022-02-24 ENCOUNTER — Ambulatory Visit (AMBULATORY_SURGERY_CENTER): Payer: Medicaid Other | Admitting: Gastroenterology

## 2022-02-24 VITALS — BP 117/66 | HR 69 | Temp 97.8°F | Resp 10 | Ht 64.0 in | Wt 129.0 lb

## 2022-02-24 DIAGNOSIS — K766 Portal hypertension: Secondary | ICD-10-CM | POA: Diagnosis not present

## 2022-02-24 DIAGNOSIS — K7030 Alcoholic cirrhosis of liver without ascites: Secondary | ICD-10-CM | POA: Diagnosis not present

## 2022-02-24 MED ORDER — SODIUM CHLORIDE 0.9 % IV SOLN: 500.0000 mL | INTRAVENOUS | Status: DC

## 2022-02-24 NOTE — Op Note (Signed)
[facility] Patient Name: [name] Procedure Date: 02/24/2022 10:27 AM MRN: [id number] Endoscopist: Mallie Mussel L. Loletha Carrow , MD Age: 51 Referring MD:  Date of Birth: Nov 24, 1970 Gender: Female Account #: [id number] Procedure:                Upper GI endoscopy Indications:              Cirrhosis rule out esophageal/gastric varices Medicines:                Monitored Anesthesia Care Procedure:                Pre-Anesthesia Assessment:                           - Prior to the procedure, a History and Physical                            was performed, and patient medications and                            allergies were reviewed. The patient's tolerance of                            previous anesthesia was also reviewed. The risks                            and benefits of the procedure and the sedation                            options and risks were discussed with the patient.                            All questions were answered, and informed consent                            was obtained. Prior Anticoagulants: The patient has                            taken no previous anticoagulant or antiplatelet                            agents. ASA Grade Assessment: II - A patient with                            mild systemic disease. After reviewing the risks                            and benefits, the patient was deemed in                            satisfactory condition to undergo the procedure.                           After obtaining informed consent, the endoscope was  passed under direct vision. Throughout the                            procedure, the patient's blood pressure, pulse, and                            oxygen saturations were monitored continuously. The                            GIF [id number] #[id number] was introduced through the                            mouth, and advanced to the second part of duodenum.                            The upper GI  endoscopy was accomplished without                            difficulty. The patient tolerated the procedure                            well. Scope In: Scope Out: Findings:                 The larynx was normal.                           There is no endoscopic evidence of Barrett's                            esophagus, esophagitis or varices in the entire                            esophagus.                           Mild portal hypertensive gastropathy was found in                            the gastric fundus and in the gastric body.                           There is no endoscopic evidence of ulceration or                            varices in the entire examined stomach.                           The cardia and gastric fundus were normal on                            retroflexion.                           The examined duodenum was normal. Complications:            No  immediate complications. Estimated Blood Loss:     Estimated blood loss was minimal. Impression:               - Normal larynx.                           - Portal hypertensive gastropathy.                           - Normal examined duodenum.                           - No specimens collected. Recommendation:           - Patient has a contact number available for                            emergencies. The signs and symptoms of potential                            delayed complications were discussed with the                            patient. Return to normal activities tomorrow.                            Written discharge instructions were provided to the                            patient.                           - Resume previous diet.                           - Continue present medications.                           - Work toward complete alcohol abstinence. Krystian Ferrentino L. Loletha Carrow, MD 02/24/2022 10:45:40 AM This report has been signed electronically.

## 2022-02-24 NOTE — Progress Notes (Signed)
Report given to PACU RN, vss BBS=Clear

## 2022-02-24 NOTE — Patient Instructions (Signed)
Please read handouts provided. Continue present medications. Work towards complete alcohol abstinence.   YOU HAD AN ENDOSCOPIC PROCEDURE TODAY AT [facility]:   Refer to the procedure report that was given to you for any specific questions about what was found during the examination.  If the procedure report does not answer your questions, please call your gastroenterologist to clarify.  If you requested that your care partner not be given the details of your procedure findings, then the procedure report has been included in a sealed envelope for you to review at your convenience later.  YOU SHOULD EXPECT: Some feelings of bloating in the abdomen. Passage of more gas than usual.  Walking can help get rid of the air that was put into your GI tract during the procedure and reduce the bloating. If you had a lower endoscopy (such as a colonoscopy or flexible sigmoidoscopy) you may notice spotting of blood in your stool or on the toilet paper. If you underwent a bowel prep for your procedure, you may not have a normal bowel movement for a few days.  Please Note:  You might notice some irritation and congestion in your nose or some drainage.  This is from the oxygen used during your procedure.  There is no need for concern and it should clear up in a day or so.  SYMPTOMS TO REPORT IMMEDIATELY:   Following upper endoscopy (EGD)  Vomiting of blood or coffee ground material  New chest pain or pain under the shoulder blades  Painful or persistently difficult swallowing  New shortness of breath  Fever of 100F or higher  Black, tarry-looking stools  For urgent or emergent issues, a gastroenterologist can be reached at any hour by calling [phone]. Do not use MyChart messaging for urgent concerns.    DIET:  We do recommend a small meal at first, but then you may proceed to your regular diet.  Drink plenty of fluids but you should avoid alcoholic beverages for 24  hours.  ACTIVITY:  You should plan to take it easy for the rest of today and you should NOT DRIVE or use heavy machinery until tomorrow (because of the sedation medicines used during the test).    FOLLOW UP: Our staff will call the number listed on your records 24-72 hours following your procedure to check on you and address any questions or concerns that you may have regarding the information given to you following your procedure. If we do not reach you, we will leave a message.  We will attempt to reach you two times.  During this call, we will ask if you have developed any symptoms of COVID 19. If you develop any symptoms (ie: fever, flu-like symptoms, shortness of breath, cough etc.) before then, please call [phone].  If you test positive for Covid 19 in the 2 weeks post procedure, please call and report this information to [place].    If any biopsies were taken you will be contacted by phone or by letter within the next 1-3 weeks.  Please call us at [phone] if you have not heard about the biopsies in 3 weeks.    SIGNATURES/CONFIDENTIALITY: You and/or your care partner have signed paperwork which will be entered into your electronic medical record.  These signatures attest to the fact that that the information above on your After Visit Summary has been reviewed and is understood.  Full responsibility of the confidentiality of this discharge information lies with you and/or your care-partner.

## 2022-02-24 NOTE — Progress Notes (Signed)
No changes to clinical history since GI office visit on 02/22/22.  The patient is appropriate for an endoscopic procedure in the ambulatory setting.  - Latoi Giraldo Danis, MD    

## 2022-02-25 ENCOUNTER — Telehealth: Payer: Self-pay

## 2022-02-25 NOTE — Telephone Encounter (Signed)
First post procedure follow up call, no answer 

## 2022-02-25 NOTE — Telephone Encounter (Signed)
Second post procedure follow up call, no answer 

## 2022-03-13 ENCOUNTER — Other Ambulatory Visit (HOSPITAL_COMMUNITY): Payer: Medicaid Other

## 2022-03-13 ENCOUNTER — Ambulatory Visit (HOSPITAL_COMMUNITY)
Admission: EM | Admit: 2022-03-13 | Discharge: 2022-03-13 | Disposition: A | Discharge status: Home / Self Care | Payer: Medicaid Other | Attending: Student | Admitting: Student

## 2022-03-13 ENCOUNTER — Ambulatory Visit (INDEPENDENT_AMBULATORY_CARE_PROVIDER_SITE_OTHER): Payer: Medicaid Other

## 2022-03-13 ENCOUNTER — Encounter (HOSPITAL_COMMUNITY): Payer: Self-pay

## 2022-03-13 DIAGNOSIS — W19XXXA Unspecified fall, initial encounter: Secondary | ICD-10-CM

## 2022-03-13 DIAGNOSIS — S92504A Nondisplaced unspecified fracture of right lesser toe(s), initial encounter for closed fracture: Secondary | ICD-10-CM

## 2022-03-13 DIAGNOSIS — M7989 Other specified soft tissue disorders: Secondary | ICD-10-CM | POA: Diagnosis not present

## 2022-03-13 DIAGNOSIS — M79674 Pain in right toe(s): Secondary | ICD-10-CM | POA: Diagnosis not present

## 2022-03-13 MED ORDER — KETOROLAC TROMETHAMINE 10 MG PO TABS: 10.0000 mg | ORAL_TABLET | Freq: Four times a day (QID) | ORAL | 0 refills | Status: DC | PRN

## 2022-03-13 NOTE — ED Triage Notes (Signed)
Pt reports right foot pain and toe pain that started yesterday.

## 2022-03-13 NOTE — Discharge Instructions (Addendum)
-  Contact your podiatrist Dr. Jacqualyn Posey tomorrow -Wear the shoe and use crutches while standing and walking  -Boot, rest, ice, elevation -Minimize weight on the foot until you follow-up with your podiatrist -Toradol (Ketorolac), one pill up to every 6 hours for pain. Make sure to take this with food. Avoid other NSAIDs like ibuprofen, alleve, naproxen while taking this medication.  -You can also take Tylenol '1000mg'$  up to 3x daily

## 2022-03-13 NOTE — ED Provider Notes (Addendum)
[place]    CSN: [id number] Arrival date & time: 03/13/22  1721      History   Chief Complaint Chief Complaint  Patient presents with   Foot Pain   Toe Pain    HPI [name] is a 51 y.o. female presenting with R 2nd toe pain following tripping one day ago. History ORIF this toe/foot in the past (2 years ago per pt), followed by Dr. Jacqualyn Posey in podiatry. States tripped on a deformity in the concrete one day ago. Denies sensation changes.   HPI  Past Medical History:  Diagnosis Date   Anxiety    Arthritis    right foot   Cirrhosis ([place])    GERD (gastroesophageal reflux disease)    History of alcoholism ([place])    History of cellulitis    2015  right side face   Hypertension    followed by pcp  (per pt never had stress test)   Malnutrition ([place])    Metatarsal fracture    right 2nd   Peripheral neuropathy    feet   Strachan's syndrome    painful neuropathy   Wears dentures    upper   Wears glasses     Patient Active Problem List   Diagnosis Date Noted   Alcohol abuse 02/16/2022   Hyperlipidemia 02/16/2022   Subcutaneous nodule of abdominal wall 10/21/2021   Cirrhosis ([place]) 10/21/2021   Tobacco abuse 03/19/2019   Vitamin D deficiency 10/25/2018   Closed fracture of bone of right foot 07/25/2018   Insomnia 10/02/2017   Ingrown toenail 04/05/2017   Closed nondisplaced fracture of proximal phalanx of lesser toe of right foot 04/05/2017   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   Peripheral neuropathy 12/24/2013   Lower extremity pain, bilateral 12/18/2013    Past Surgical History:  Procedure Laterality Date   BREAST BIOPSY     MULTIPLE EXTRACTIONS WITH ALVEOLOPLASTY N/A 12/25/2013   Procedure: Extraction of tooth #'s 2,3,4,5,6,8,9,10,11,12,13,14,15,20,21,22,23,24,25,26,27, 83,66,29,47 with alveoloplasty and bilateral mandibular tori reductions;  Surgeon: Lenn Cal, DDS;  Location: [facility];  Service: Oral Surgery;   Laterality: N/A;   ORIF TOE FRACTURE Right 07/03/2019   Procedure: OPEN REDUCTION INTERNAL FIXATION (ORIF) METATARSAL (TOE) FRACTURE;  Surgeon: Trula Slade, [name];  Location: [facility];  Service: Podiatry;  Laterality: Right;  LEG BLOCK   SURAL NERVE BX Right 12/24/2013   Procedure: SURAL NERVE BIOPSY;  Surgeon: Kristeen Miss, MD;  Location: [facility];  Service: Neurosurgery;  Laterality: Right;   TUBAL LIGATION Bilateral 2002   PPTL    OB History   No obstetric history on file.      Home Medications    Prior to Admission medications   Medication Sig Start Date End Date Taking? Authorizing Provider  ketorolac (TORADOL) 10 MG tablet Take 1 tablet (10 mg total) by mouth every 6 (six) hours as needed. 03/13/22  Yes Hazel Sams, PA-C  amLODipine (NORVASC) 5 MG tablet Take 1 tablet (5 mg total) by mouth daily. 02/16/22   Charlott Rakes, MD  busPIRone (BUSPAR) 5 MG tablet Take 1 tablet (5 mg total) by mouth 2 (two) times daily. 02/16/22   Charlott Rakes, MD  cloNIDine (CATAPRES) 0.1 MG tablet Take 1 tablet (0.1 mg total) by mouth at bedtime. For hot flashes 02/16/22   Charlott Rakes, MD  DULoxetine (CYMBALTA) 60 MG capsule Take 1 capsule (60 mg total) by mouth 2 (two) times daily. 02/16/22   Newlin, Charlane Ferretti,  MD  hydrALAZINE (APRESOLINE) 10 MG tablet Take 1 tablet (10 mg total) by mouth 3 (three) times daily. 02/16/22   Charlott Rakes, MD  lidocaine (LIDODERM) 5 % Place 1 patch onto the skin daily. Remove & Discard patch within 12 hours or as directed by MD Patient not taking: Reported on 02/24/2022 02/16/22   Charlott Rakes, MD  meloxicam (MOBIC) 7.5 MG tablet Take 1 tablet (7.5 mg total) by mouth daily. Patient not taking: Reported on 02/24/2022 02/21/22   Charlott Rakes, MD  Multiple Vitamins-Minerals (MULTIVITAMIN WITH MINERALS) tablet Take 1 tablet by mouth daily.    [provider]  naltrexone (DEPADE) 50 MG tablet Take 1 tablet (50 mg total) by mouth  daily. Patient not taking: Reported on 02/24/2022 02/16/22   Charlott Rakes, MD  nortriptyline (PAMELOR) 50 MG capsule Take 1 capsule (50 mg total) by mouth at bedtime. 12/10/21   Narda Amber K, DO  omeprazole (PRILOSEC) 20 MG capsule Take 1 capsule (20 mg total) by mouth daily. 02/16/22   Charlott Rakes, MD  potassium chloride (KLOR-CON M10) 10 MEQ tablet Take 1 tablet (10 mEq total) by mouth daily. 02/17/22   Charlott Rakes, MD  pregabalin (LYRICA) 150 MG capsule Take 1 capsule (150 mg total) by mouth 3 (three) times daily. 12/10/21   Alda Berthold, DO    Family History Family History  Adopted: Yes  Problem Relation Age of Onset   Diabetes Father    Hypertension Mother    Raynaud syndrome Mother    Diabetes Maternal Aunt    Heart disease Maternal Aunt    Diabetes Maternal Uncle    Heart disease Maternal Uncle    Drug abuse Maternal Grandmother    Cancer Maternal Grandmother        intestinal   Hypertension Maternal Grandmother    Cancer Maternal Grandfather        lung   Hypertension Paternal Grandfather    Colon cancer Neg Hx    Colon polyps Neg Hx    Esophageal cancer Neg Hx    Rectal cancer Neg Hx    Stomach cancer Neg Hx     Social History Social History   Tobacco Use   Smoking status: Every Day    Packs/day: 0.50    Years: 20.00    Total pack years: 10.00    Types: Cigarettes   Smokeless tobacco: Never  Vaping Use   Vaping Use: Never used  Substance Use Topics   Alcohol use: Not Currently    Comment: 07-02-2019-- per pt 4 drinks per day   Drug use: Not Currently    Comment: 07-02-2019  -- per pt last used in age 69s     Allergies   Macrobid [nitrofurantoin macrocrystal]   Review of Systems Review of Systems  Musculoskeletal:        R 2nd toe pain   All other systems reviewed and are negative.    Physical Exam Triage Vital Signs ED Triage Vitals  Enc Vitals Group     BP 03/13/22 1730 136/69     Pulse Rate 03/13/22 1730 88     Resp 03/13/22  1730 16     Temp 03/13/22 1730 98.2 F (36.8 C)     Temp Source 03/13/22 1730 Oral     SpO2 --      Weight --      Height --      Head Circumference --      Peak Flow --  Pain Score 03/13/22 1732 7     Pain Loc --      Pain Edu? --      Excl. in [place]? --    No data found.  Updated Vital Signs BP 136/69   Pulse 88   Temp 98.2 F (36.8 C) (Oral)   Resp 16   LMP  (LMP Unknown)   Visual Acuity Right Eye Distance:   Left Eye Distance:   Bilateral Distance:    Right Eye Near:   Left Eye Near:    Bilateral Near:     Physical Exam Vitals reviewed.  Constitutional:      General: She is not in acute distress.    Appearance: Normal appearance. She is not ill-appearing.  HENT:     Head: Normocephalic and atraumatic.  Pulmonary:     Effort: Pulmonary effort is normal.  Musculoskeletal:     Comments: R 2nd toe is swollen and tender. There is as well healed surgical scar over the midfoot; no tenderness overlying this. No midfoot or malleolar tenderness. DP 2+, cap refill < 2 seconds.   Neurological:     General: No focal deficit present.     Mental Status: She is alert and oriented to person, place, and time.  Psychiatric:        Mood and Affect: Mood normal.        Behavior: Behavior normal.        Thought Content: Thought content normal.        Judgment: Judgment normal.      UC Treatments / Results  Labs (all labs ordered are listed, but only abnormal results are displayed) Labs Reviewed - No data to display  EKG   Radiology No results found.  Procedures Procedures (including critical care time)  Medications Ordered in UC Medications - No data to display  Initial Impression / Assessment and Plan / UC Course  I have reviewed the triage vital signs and the nursing notes.  Pertinent labs & imaging results that were available during my care of the patient were reviewed by me and considered in my medical decision making (see chart for details).      This patient is a very pleasant 51 y.o. year old female presenting with R 2nd toe fracture. Neurovascularly intact. History ORIF R toe fracture 2020. She is already followed by podiatry - Dr. Jacqualyn Posey. She will call him tomorrow to schedule f/u.  Xray R foot - fracture R 2nd toe. Films reviewed by myself and attending physician Dr. Windy Carina.   Placed in post-op shoe and crutches.   Toradol PO sent for pain. She does not have kidney ds. Creatinine normal range 12/08/21.   ED return precautions discussed. Patient verbalizes understanding and agreement.   Discussed treatment plan with attending physician Dr. Windy Carina who is in agreement.   Final Clinical Impressions(s) / UC Diagnoses   Final diagnoses:  Closed nondisplaced fracture of phalanx of lesser toe of right foot, unspecified phalanx, initial encounter     Discharge Instructions      -Contact your podiatrist Dr. Jacqualyn Posey tomorrow -Wear the shoe and use crutches while standing and walking  -Boot, rest, ice, elevation -Minimize weight on the foot until you follow-up with your podiatrist     ED Prescriptions     Medication Sig Dispense Auth. Provider   ketorolac (TORADOL) 10 MG tablet Take 1 tablet (10 mg total) by mouth every 6 (six) hours as needed. 20 tablet Hazel Sams, PA-C  PDMP not reviewed this encounter.   Hazel Sams, PA-C 03/13/22 1823    Hazel Sams, PA-C 03/13/22 1824

## 2022-03-22 ENCOUNTER — Encounter: Payer: Self-pay | Admitting: Podiatry

## 2022-03-22 ENCOUNTER — Ambulatory Visit (INDEPENDENT_AMBULATORY_CARE_PROVIDER_SITE_OTHER): Payer: Medicaid Other | Admitting: Podiatry

## 2022-03-22 DIAGNOSIS — S92514A Nondisplaced fracture of proximal phalanx of right lesser toe(s), initial encounter for closed fracture: Secondary | ICD-10-CM

## 2022-03-22 NOTE — Progress Notes (Signed)
  Subjective:  Patient ID: [name], female    DOB: 05-04-1971,   MRN: [id number]  Chief Complaint  Patient presents with   Toe Injury    right middle toe broken     51 y.o. female presents for concern of broken right second toe. Relates she fell on 7/2 and injuried the toe. She was seen in urgent care and X-rays showed a fracture. Was given shoe and told to follow-up. She does have an extensive history of fracture of the second metatarsal and surgery on the right foot with Dr. Jacqualyn Posey in the past . Denies any other pedal complaints. Denies n/v/f/c.   Past Medical History:  Diagnosis Date   Anxiety    Arthritis    right foot   Cirrhosis ([place])    GERD (gastroesophageal reflux disease)    History of alcoholism ([place])    History of cellulitis    2015  right side face   Hypertension    followed by pcp  (per pt never had stress test)   Malnutrition ([place])    Metatarsal fracture    right 2nd   Peripheral neuropathy    feet   Strachan's syndrome    painful neuropathy   Wears dentures    upper   Wears glasses     Objective:  Physical Exam: Vascular: DP/PT pulses 2/4 bilateral. CFT <3 seconds. Normal hair growth on digits. Skin. No lacerations or abrasions bilateral feet. Edema noted to right second digit. Mild erythema.  Musculoskeletal: MMT 5/5 bilateral lower extremities in DF, PF, Inversion and Eversion. Deceased ROM in DF of ankle joint. Tender to the PIPJ of right second digit.  Neurological: Sensation intact to light touch.   Assessment:   1. Closed nondisplaced fracture of proximal phalanx of lesser toe of right foot, initial encounter      Plan:  Patient was evaluated and treated and all questions answered. -Xrays reviewed from urgent care showing non-displaced fracture in distal proximal phalanx of right second digit in area of previous arthrodesis.  -Discussed treatement options for toe fracture; risks, alternatives, and benefits explained. -Discussed taping of  toe.  -Continue with surgical shoe -Recommend protection, rest, ice, elevation daily until symptoms improve -Rx pain med/antinflammatories as needed -Patient to return to office in 4 weeks for serial x-rays to assess healing  or sooner if condition worsens.   Lorenda Peck, [name]

## 2022-04-20 ENCOUNTER — Ambulatory Visit: Payer: Medicaid Other | Admitting: Podiatry

## 2022-04-25 ENCOUNTER — Ambulatory Visit: Payer: Medicaid Other | Admitting: Podiatry

## 2022-04-25 ENCOUNTER — Ambulatory Visit (INDEPENDENT_AMBULATORY_CARE_PROVIDER_SITE_OTHER): Payer: Medicaid Other

## 2022-04-25 DIAGNOSIS — S92514A Nondisplaced fracture of proximal phalanx of right lesser toe(s), initial encounter for closed fracture: Secondary | ICD-10-CM | POA: Diagnosis not present

## 2022-04-25 DIAGNOSIS — S92514D Nondisplaced fracture of proximal phalanx of right lesser toe(s), subsequent encounter for fracture with routine healing: Secondary | ICD-10-CM

## 2022-04-27 NOTE — Progress Notes (Signed)
Subjective: Chief Complaint  Patient presents with   Foot Injury    Pt came in today for a follow-up right 2nd toe injury, pt states that the swelling has gone down, patient still has some pain, rate of pain is 5 out of 10. X-Rays done today TX surgical shoe     51 year old female presents the office today for follow-up evaluation of fracture right second toe.  She says the swelling has come down some but some discomfort is more in surgical shoe.  She said that prior to the injury the second toe was normal and sat flat since the injury.  Been swollen since up some.  Objective: AAO x3, NAD DP/PT pulses palpable bilaterally, CRT less than 3 seconds On the right second toe there is still localized edema present there is no erythema or any open lesions.  Mild hammertoe contractures noted.  No pain in the metatarsals or other digits.  No pain with calf compression, swelling, warmth, erythema  Assessment: Right second toe fracture  Plan: -All treatment options discussed with the patient including all alternatives, risks, complications.  -X-rays were obtained and reviewed.  Radiolucency noted on the PIPJ of the second digit with some periosteal reaction. -For now continue in the surgical shoe.  Discussed ice, elevation I showed her how to splint, tape the toes as well.  She can start to transition to regular shoe once the pain and swelling improves.  Discussed that at some point may need to have surgery should there be residual deformity to the toe. -Patient encouraged to call the office with any questions, concerns, change in symptoms.   Trula Slade [name]

## 2022-05-23 ENCOUNTER — Ambulatory Visit: Payer: Medicaid Other | Admitting: Podiatry

## 2022-05-23 ENCOUNTER — Ambulatory Visit (INDEPENDENT_AMBULATORY_CARE_PROVIDER_SITE_OTHER): Payer: Medicaid Other

## 2022-05-23 DIAGNOSIS — S92514D Nondisplaced fracture of proximal phalanx of right lesser toe(s), subsequent encounter for fracture with routine healing: Secondary | ICD-10-CM

## 2022-05-23 DIAGNOSIS — M2041 Other hammer toe(s) (acquired), right foot: Secondary | ICD-10-CM | POA: Diagnosis not present

## 2022-05-23 NOTE — Patient Instructions (Signed)

## 2022-05-30 NOTE — Progress Notes (Signed)
Subjective: Chief Complaint  Patient presents with   Foot Injury    Right 2nd toe injury, rate of pain 5 out of 10, TX: Surg shoes, pain meds, X-Rays taken today    51 year old female presents the above concerns.  She said the toe is feeling somewhat better but she is concerned about the curling of the toe which started after she injured it.  Swelling still present but overall improving.  Objective: AAO x3, NAD DP/PT pulses palpable bilaterally, CRT less than 3 seconds Rigid hammertoe contracture present of the second toe with localized edema present on the PIPJ but there is no erythema or warmth.  Swelling is actually improved compared to what it was last appointment.  No other areas of discomfort.  No pain with calf compression, swelling, warmth, erythema  Assessment: Right second toe fracture, hammertoe  Plan: -All treatment options discussed with the patient including all alternatives, risks, complications.  -X-rays obtained reviewed of the right foot.  3 views were obtained.  Radiolucency noted along the PIPJ.  No evidence of acute fracture otherwise. -We discussed both conservative as well as surgical treatment options.  She wants to proceed with surgical invention to straighten the toe given started curling again after the injury. -Discussed second toe PIPJ arthrodesis -The incision placement as well as the postoperative course was discussed with the patient. I discussed risks of the surgery which include, but not limited to, infection, bleeding, pain, swelling, need for further surgery, delayed or nonhealing, painful or ugly scar, numbness or sensation changes, over/under correction, recurrence, transfer lesions, further deformity, hardware failure, DVT/PE, loss of toe/foot. Patient understands these risks and wishes to proceed with surgery. The surgical consent was reviewed with the patient all 3 pages were signed. No promises or guarantees were given to the outcome of the procedure.  All questions were answered to the best of my ability. Before the surgery the patient was encouraged to call the office if there is any further questions. The surgery will be performed at the [facility] on an outpatient basis. -Patient encouraged to call the office with any questions, concerns, change in symptoms.   Trula Slade [name]

## 2022-06-09 ENCOUNTER — Telehealth: Payer: Self-pay

## 2022-06-09 NOTE — Telephone Encounter (Signed)
DOS 06/22/2022  HAMMERTOE REPAIR 2ND RT - [id number]  [employer]  DR. Jacqualyn Posey DID A PEER TO PEER CALL WITH DR. SALTZMAN AT [employer]. CPT [phone] WAS APPROVED. AUTH # [id number] GOOD TILL 08/20/2022.

## 2022-06-17 ENCOUNTER — Telehealth: Payer: Self-pay | Admitting: Neurology

## 2022-06-17 ENCOUNTER — Other Ambulatory Visit: Payer: Self-pay | Admitting: Family Medicine

## 2022-06-17 NOTE — Telephone Encounter (Signed)
Unable to refill per protocol, last refill by another provider 06/17/22. Will refuse duplicate request.  Requested Prescriptions  Pending Prescriptions Disp Refills  . pregabalin (LYRICA) 150 MG capsule 90 capsule 5    Sig: Take 1 capsule (150 mg total) by mouth 3 (three) times daily.     Not Delegated - Neurology:  Anticonvulsants - Controlled - pregabalin Failed - 06/17/2022  3:00 PM      Failed - This refill cannot be delegated      Passed - Cr in normal range and within 360 days    Creat  Date Value Ref Range Status  07/19/2016 0.70 0.50 - 1.10 mg/dL Final   Creatinine, Ser  Date Value Ref Range Status  02/16/2022 0.58 0.57 - 1.00 mg/dL Final   Creatinine, Urine  Date Value Ref Range Status  12/19/2013 69.70 mg/dL Final         Passed - Completed PHQ-2 or PHQ-9 in the last 360 days      Passed - Valid encounter within last 12 months    Recent Outpatient Visits          4 months ago Hypokalemia   [place], [place], MD   9 months ago Left upper quadrant abdominal mass   [place], Enobong, MD   1 year ago Annual physical exam   [place], Enobong, MD   2 years ago Essential hypertension   [place], Enobong, MD   2 years ago Suspected COVID-19 virus infection   [facility] Charlott Rakes, MD

## 2022-06-17 NOTE — Telephone Encounter (Signed)
1. Which medications need refilled? (List name and dosage, if known) Lyrica  2. Which pharmacy/location is medication to be sent to? (include street and city if local pharmacy) [place]

## 2022-06-17 NOTE — Telephone Encounter (Signed)
Copied from [place] [id number]. Topic: General - Other >> Jun 17, 2022  2:54 PM Everette C wrote: Reason for CRM: Medication Refill - Medication: pregabalin (LYRICA) 150 MG capsule [[id number]]   Has the patient contacted their pharmacy? Yes.  The patient has contacted their pharmacy but been unable to speak with anyone  (Agent: If no, request that the patient contact the pharmacy for the refill. If patient does not wish to contact the pharmacy document the reason why and proceed with request.) (Agent: If yes, when and what did the pharmacy advise?)  Preferred Pharmacy (with phone number or street name): [id number] [place] [id number]: [phone]: [phone]: Open 24 hours   Has the patient been seen for an appointment in the last year OR does the patient have an upcoming appointment? Yes.    Agent: Please be advised that RX refills may take up to 3 business days. We ask that you follow-up with your pharmacy.

## 2022-06-22 ENCOUNTER — Other Ambulatory Visit: Payer: Self-pay | Admitting: Podiatry

## 2022-06-22 ENCOUNTER — Encounter: Payer: Self-pay | Admitting: Podiatry

## 2022-06-22 DIAGNOSIS — M2041 Other hammer toe(s) (acquired), right foot: Secondary | ICD-10-CM | POA: Diagnosis not present

## 2022-06-22 DIAGNOSIS — M24574 Contracture, right foot: Secondary | ICD-10-CM | POA: Diagnosis not present

## 2022-06-22 MED ORDER — CEPHALEXIN 500 MG PO CAPS: 500.0000 mg | ORAL_CAPSULE | Freq: Three times a day (TID) | ORAL | 0 refills | Status: DC

## 2022-06-22 MED ORDER — PROMETHAZINE HCL 25 MG PO TABS: 25.0000 mg | ORAL_TABLET | Freq: Three times a day (TID) | ORAL | 0 refills | Status: DC | PRN

## 2022-06-22 MED ORDER — OXYCODONE-ACETAMINOPHEN 5-325 MG PO TABS: 1.0000 | ORAL_TABLET | Freq: Four times a day (QID) | ORAL | 0 refills | Status: DC | PRN

## 2022-06-22 NOTE — Progress Notes (Signed)
Postop medications sent 

## 2022-06-27 ENCOUNTER — Ambulatory Visit (INDEPENDENT_AMBULATORY_CARE_PROVIDER_SITE_OTHER): Payer: Medicaid Other

## 2022-06-27 ENCOUNTER — Ambulatory Visit (INDEPENDENT_AMBULATORY_CARE_PROVIDER_SITE_OTHER): Payer: Medicaid Other | Admitting: Podiatry

## 2022-06-27 DIAGNOSIS — M2041 Other hammer toe(s) (acquired), right foot: Secondary | ICD-10-CM

## 2022-06-27 DIAGNOSIS — Z9889 Other specified postprocedural states: Secondary | ICD-10-CM

## 2022-06-27 MED ORDER — OXYCODONE-ACETAMINOPHEN 5-325 MG PO TABS: 1.0000 | ORAL_TABLET | Freq: Four times a day (QID) | ORAL | 0 refills | Status: DC | PRN

## 2022-06-29 NOTE — Progress Notes (Signed)
Subjective: Chief Complaint  Patient presents with   Routine Post Op    Right foot 2nd toe hammertoe repair POV#1, Rate of pain 7 out of 10, TX; pain meds, Cam boot. X-rays taken today     [name] is a 51 y.o. is seen today in office s/p right second digit hammertoe , second MPJ capsulotomy preformed on 06/22/2022.  States that she is doing well and her pain is improving but still taking pain medication.  No recent injuries.  She is still wearing surgical shoe.  Denies any systemic complaints such as fevers, chills, nausea, vomiting. No calf pain, chest pain, shortness of breath.   Objective: General: No acute distress, AAOx3  DP/PT pulses palpable 2/4, CRT < 3 sec to all digits.  Protective sensation intact. Motor function intact.  Right foot: Incision is well coapted without any evidence of dehiscence with sutures intact. There is no surrounding erythema, ascending cellulitis, fluctuance, crepitus, malodor, drainage/purulence. There is mild edema around the surgical site. There is mild pain along the surgical site.  K wire intact but any drainage.  Toes are rectus. No other areas of tenderness to bilateral lower extremities.  No other open lesions or pre-ulcerative lesions.  No pain with calf compression, swelling, warmth, erythema.   Assessment and Plan:  Status post right second digit hammertoe surgery, doing well with no complications   -Treatment options discussed including all alternatives, risks, and complications -X-rays were obtained reviewed.  3 views of the right foot were obtained.  No evidence of acute fracture.  Status post hammertoe repair with K wire intact. -Antibiotic ointment was applied followed by dressing.  She can keep the dressing clean, dry, intact -Remain in surgical shoe, offloading -Ice/elevation -Pain medication as needed. -Monitor for any clinical signs or symptoms of infection and DVT/PE and directed to call the office immediately should any occur or go  to the ER. -Follow-up as scheduled for possible suture removal or sooner if any problems arise. In the meantime, encouraged to call the office with any questions, concerns, change in symptoms.   [name], [name]

## 2022-07-04 ENCOUNTER — Encounter: Payer: Self-pay | Admitting: Podiatry

## 2022-07-04 ENCOUNTER — Other Ambulatory Visit: Payer: Self-pay | Admitting: Podiatry

## 2022-07-04 MED ORDER — OXYCODONE-ACETAMINOPHEN 5-325 MG PO TABS: 1.0000 | ORAL_TABLET | Freq: Four times a day (QID) | ORAL | 0 refills | Status: DC | PRN

## 2022-07-07 ENCOUNTER — Encounter: Payer: Medicaid Other | Admitting: Podiatry

## 2022-07-07 ENCOUNTER — Ambulatory Visit (INDEPENDENT_AMBULATORY_CARE_PROVIDER_SITE_OTHER): Payer: Medicaid Other | Admitting: *Deleted

## 2022-07-07 DIAGNOSIS — M2041 Other hammer toe(s) (acquired), right foot: Secondary | ICD-10-CM

## 2022-07-07 DIAGNOSIS — Z9889 Other specified postprocedural states: Secondary | ICD-10-CM

## 2022-07-07 NOTE — Progress Notes (Signed)
Patient presents today for post op visit # 2, patient of [facility].    POV #2 DOS 06/22/2022 HAMMERTOE REPAIR RT 2ND TOE     She presents in her walking boot. Denies any falls or injury to the foot. Foot is swollen. No signs of infection. No calf pain or shortness of breath. Bandages dry and intact. Incision is intact. Minimal pain.  No xrays taken today.  Sutures removed, incision intact and steri strips applied for reinforcement Redressed with ace and stockinette and placed her back in her boot. Reviewed icing and elevation. She will follow up with Dr. Jacqualyn Posey next week for POV# 3.

## 2022-07-08 ENCOUNTER — Telehealth: Payer: Self-pay

## 2022-07-08 ENCOUNTER — Encounter: Payer: Self-pay | Admitting: Podiatry

## 2022-07-08 NOTE — Telephone Encounter (Signed)
Noted, thanks!

## 2022-07-11 ENCOUNTER — Other Ambulatory Visit: Payer: Self-pay | Admitting: Podiatry

## 2022-07-11 MED ORDER — OXYCODONE-ACETAMINOPHEN 5-325 MG PO TABS: 1.0000 | ORAL_TABLET | Freq: Four times a day (QID) | ORAL | 0 refills | Status: DC | PRN

## 2022-07-14 ENCOUNTER — Telehealth: Payer: Self-pay | Admitting: Podiatry

## 2022-07-14 NOTE — Progress Notes (Unsigned)
gss

## 2022-07-14 NOTE — Telephone Encounter (Signed)
Called pt to let her know that as of today, she has [facility] which we do not take. Told pt when she comes in for her postop visit, she won't owe anything for the office visit related to her surgery, but if any x-rays are taken, any DME is given, she would be responsible to pay. Told pt to call who handles her Medicaid to see if they can get it switched back to [facility] or another medicaid plan.

## 2022-07-19 ENCOUNTER — Ambulatory Visit (INDEPENDENT_AMBULATORY_CARE_PROVIDER_SITE_OTHER): Payer: Self-pay | Admitting: Podiatry

## 2022-07-19 DIAGNOSIS — M2041 Other hammer toe(s) (acquired), right foot: Secondary | ICD-10-CM

## 2022-07-19 MED ORDER — OXYCODONE-ACETAMINOPHEN 5-325 MG PO TABS: 1.0000 | ORAL_TABLET | Freq: Four times a day (QID) | ORAL | 0 refills | Status: DC | PRN

## 2022-07-20 ENCOUNTER — Telehealth: Payer: Self-pay | Admitting: Neurology

## 2022-07-20 NOTE — Telephone Encounter (Signed)
1. Which medications need refilled? (List name and dosage, if known) lyrica 150 mg  2. Which pharmacy/location is medication to be sent to? (include street and city if Management consultant) [facility] [address]  3. Do they need a 30 day or 90 day supply? 30  Patient is completely out of the medication as of yesterday.   She only has [employer] Medicaid until December she said and therefore no coverage for her Lyrica.  She'd like some help with this.

## 2022-07-21 NOTE — Telephone Encounter (Signed)
Declined, Rx was sent in October with 5 refills.  pregabalin (LYRICA) 150 MG capsule [[id number]]   Order Details Dose: 150 mg Route: Oral Frequency: 3 times daily  Dispense Quantity: 90 capsule Refills: 5   Note to Pharmacy: This request is for a new prescription for a controlled substance as required by Federal/State law.       Sig: TAKE 1 CAPSULE BY MOUTH 3 TIMES DAILY.       Start Date: 06/17/22 End Date: --  Written Date: 06/17/22 Expiration Date: 06/17/23  [id number] [place]

## 2022-07-21 NOTE — Telephone Encounter (Signed)
Called patient and informed her that her pharmacy has refills and she is not due for a refill. Patient stated that she knows that and it is the cost that she needs help with. Patient stated that they are trying to charge her $500 for her prescription. I told patient about [facility] and told her to pull up the website and type her medication information in and it will provide her a list of pharmacy's with cost at a discounted price. Patient thanked me for the advice and will do that.

## 2022-07-22 ENCOUNTER — Other Ambulatory Visit: Payer: Self-pay | Admitting: Anesthesiology

## 2022-07-22 ENCOUNTER — Encounter: Payer: Self-pay | Admitting: Gastroenterology

## 2022-07-22 MED ORDER — PREGABALIN 150 MG PO CAPS: 150.0000 mg | ORAL_CAPSULE | Freq: Three times a day (TID) | ORAL | 2 refills | Status: DC

## 2022-07-22 NOTE — Telephone Encounter (Signed)
Called patient and she needs her RX transferred over to [facility] on [address] where the cost will be at $18 with [facility]. Informed patient that I will call her other pharmacy [facility] [address] to [facility] and  resend rx to new pharmacy so that she is able to get it at a lower cost.  [facility] [address] and patients Lyrica was canceled along with the 4 refills on file. Will resend to new pharmacy for patient.

## 2022-07-22 NOTE — Telephone Encounter (Signed)
Patient called to request a refill on her Lyrica. She has been out for two days and would like to get that refill today if possible.   1. Which medications need refilled? Lyrica 150 mg  2. Which pharmacy/location is medication to be sent to? [facility] on [address]   3. Do they need a 30 day or 90 day supply? 90 day supply

## 2022-07-22 NOTE — Progress Notes (Signed)
Subjective: Chief Complaint  Patient presents with   Routine Post Op    POV #3 DOS 06/22/2022 HAMMERTOE REPAIR RT 2ND TOE    [name] is a 51 y.o. is seen today in office s/p right second digit hammertoe , second MPJ capsulotomy preformed on 06/22/2022.  She has been doing well, still having some pain.  She denies any fevers or chills.  No other concerns today.  Objective: General: No acute distress, AAOx3  DP/PT pulses palpable 2/4, CRT < 3 sec to all digits.  Protective sensation intact. Motor function intact.  Right foot: Incision is well coapted without any evidence of dehiscence and scar is forming.  K wire intact any drainage or pus.  There is still some mild edema but there is no erythema or warmth there is no obvious signs of infection but the toes is rectus.  No other areas of tenderness to bilateral lower extremities.  No other open lesions or pre-ulcerative lesions.  No pain with calf compression, swelling, warmth, erythema.   Assessment and Plan:  Status post right second digit hammertoe surgery, doing well with no complications   -Treatment options discussed including all alternatives, risks, and complications -X-rays were obtained reviewed.  3 views of the right foot were obtained.  No evidence of acute fracture.  Status post hammertoe repair with K wire intact.  No complicating factors. -Antibiotic ointment was applied followed by dressing.  Discussed washing with soap and water, dry thoroughly and apply a similar bandage. -Remain in surgical shoe, offloading -Ice/elevation -Pain medication as needed. -Monitor for any clinical signs or symptoms of infection and DVT/PE and directed to call the office immediately should any occur or go to the ER. -Follow-up as scheduled for possible pin removal or sooner if any problems arise. In the meantime, encouraged to call the office with any questions, concerns, change in symptoms.   *X-ray next appointment, pin  removal  [name], [name]

## 2022-07-25 ENCOUNTER — Other Ambulatory Visit: Payer: Self-pay | Admitting: Podiatry

## 2022-07-25 MED ORDER — OXYCODONE-ACETAMINOPHEN 5-325 MG PO TABS: 1.0000 | ORAL_TABLET | Freq: Four times a day (QID) | ORAL | 0 refills | Status: DC | PRN

## 2022-08-03 ENCOUNTER — Other Ambulatory Visit: Payer: Self-pay | Admitting: Podiatry

## 2022-08-03 MED ORDER — OXYCODONE-ACETAMINOPHEN 5-325 MG PO TABS: 1.0000 | ORAL_TABLET | Freq: Three times a day (TID) | ORAL | 0 refills | Status: DC | PRN

## 2022-08-08 ENCOUNTER — Ambulatory Visit (INDEPENDENT_AMBULATORY_CARE_PROVIDER_SITE_OTHER): Payer: Self-pay | Admitting: Podiatry

## 2022-08-08 ENCOUNTER — Ambulatory Visit (INDEPENDENT_AMBULATORY_CARE_PROVIDER_SITE_OTHER): Payer: Self-pay

## 2022-08-08 DIAGNOSIS — M2041 Other hammer toe(s) (acquired), right foot: Secondary | ICD-10-CM

## 2022-08-08 DIAGNOSIS — Z9889 Other specified postprocedural states: Secondary | ICD-10-CM

## 2022-08-09 ENCOUNTER — Other Ambulatory Visit: Payer: Self-pay | Admitting: Podiatry

## 2022-08-09 MED ORDER — OXYCODONE-ACETAMINOPHEN 5-325 MG PO TABS: 1.0000 | ORAL_TABLET | Freq: Three times a day (TID) | ORAL | 0 refills | Status: DC | PRN

## 2022-08-13 NOTE — Progress Notes (Signed)
Subjective: Chief Complaint  Patient presents with   Routine Post Op    POV #3 right foot hammertoe repair 2nd toe, patient denies any N/V/F/C/SOB, patient rates the pain a 6 out of 10, patient has some redness X-Rays taken today     [name] is a 51 y.o. is seen today in office s/p right second digit hammertoe , second MPJ capsulotomy preformed on 06/22/2022.  She presents today for K wire removal.  Has some discomfort but seems to be improving.  No fevers or chills.  No injuries.  No other concerns.    Objective: General: No acute distress, AAOx3  DP/PT pulses palpable 2/4, CRT < 3 sec to all digits.  Protective sensation intact. Motor function intact.  Right foot: Incision is well coapted without any evidence of dehiscence and scar is forming.  K wire intact any drainage or pus.  Toes in rectus position.  There is still some mild edema but it seems to be improving as well.  There is no erythema or warmth associated with this.  There is no drainage approximately obvious signs of infection. No other open lesions or pre-ulcerative lesions.  No pain with calf compression, swelling, warmth, erythema.   Assessment and Plan:  Status post right second digit hammertoe surgery, doing well with no complications   -Treatment options discussed including all alternatives, risks, and complications -X-rays were obtained reviewed.  3 views of the right foot were obtained.  No evidence of acute fracture.  Status post hammertoe repair with K wire intact.  No postoperative complications noted. -I cleaned the K wire and the K wires removed in total without any complications.  Antibiotic ointment and a bandage applied. -Dispensed a single loop toe regulator to help hold the toe rectus. -Continue ice, elevate.  Compression to help with residual edema. -Pain medication as needed. -Monitor for any clinical signs or symptoms of infection and DVT/PE and directed to call the office immediately should any occur  or go to the ER. -Follow-up as scheduled for sooner if any problems arise. In the meantime, encouraged to call the office with any questions, concerns, change in symptoms.   *X-ray next appointment  [name], [name]

## 2022-08-17 ENCOUNTER — Other Ambulatory Visit: Payer: Self-pay | Admitting: Podiatry

## 2022-08-17 MED ORDER — OXYCODONE-ACETAMINOPHEN 5-325 MG PO TABS: 1.0000 | ORAL_TABLET | Freq: Three times a day (TID) | ORAL | 0 refills | Status: DC | PRN

## 2022-08-23 ENCOUNTER — Other Ambulatory Visit: Payer: Self-pay | Admitting: Podiatry

## 2022-08-23 MED ORDER — OXYCODONE-ACETAMINOPHEN 5-325 MG PO TABS: 1.0000 | ORAL_TABLET | Freq: Three times a day (TID) | ORAL | 0 refills | Status: DC | PRN

## 2022-08-30 ENCOUNTER — Other Ambulatory Visit: Payer: Self-pay | Admitting: Podiatry

## 2022-08-30 MED ORDER — OXYCODONE-ACETAMINOPHEN 5-325 MG PO TABS: 1.0000 | ORAL_TABLET | Freq: Three times a day (TID) | ORAL | 0 refills | Status: DC | PRN

## 2022-08-30 NOTE — Progress Notes (Unsigned)
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  She was still consuming alcohol at that point, and had chronic right upper quadrant pain.  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  Coexisting chronic pain syndrome.  I am concerned that NSAIDs will not only fail to control this type of pain but put her at increased risk for GI bleeding, and I recommended she not take the meloxicam.  I am not certain what medicines may be safe and effective to control this pain, and I would recommend against use of opioids in a patient with alcohol dependency.   [name] needs an upper endoscopy to evaluate her upper abdominal pain and rule out ulcers and H. 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  Screening colonoscopy 02/17/2021 (prior to cirrhosis diagnosis): Several adenomatous polyps, largest 12 mm. ________________________________   [name] was here to follow-up for cirrhosis. She has some occasional difficulty with remembering things, says her sleep is variable, no episodes of confusion that anyone is brought to her attention. She continues to have upper abdominal pain either on the right or left as before.  She is also felt she lost about 15 pounds in the last few months since 8 foot injury and surgery.  However, her weight is down 3 pounds on our scale from the June visit. She has also noticed some increased abdominal girth and had to cut the top of her sweatpants to fit better. She is still drinking alcohol, and says she plans to go to inpatient rehab after the first of the year.  She has identified a few locations  and needs to choose 1.  ROS: Cardiovascular:  no chest pain Respiratory: no dyspnea Chronic cough, no hemoptysis Chronic musculoskeletal pain  Fatigue Remainder systems negative except as above  The patient's Past Medical, Family and Social History were reviewed and are on file in the EMR. Past Medical History:  Diagnosis Date   Anxiety    Arthritis    right foot   Cirrhosis ([place])    GERD (gastroesophageal reflux disease)    History of alcoholism ([place])    History of cellulitis    2015  right side face   Hypertension    followed by pcp  (per pt never had stress test)   Malnutrition ([place])    Metatarsal fracture    right 2nd   Peripheral neuropathy    feet   Strachan's syndrome    painful neuropathy   Wears dentures    upper   Wears glasses     Objective:  Med list reviewed  Current Outpatient Medications:    amLODipine (NORVASC) 5 MG tablet, Take 1 tablet (5 mg total) by mouth daily., Disp: 90 tablet, Rfl: 1   busPIRone (BUSPAR) 5 MG tablet, Take 1 tablet (5 mg total) by mouth 2 (two) times daily., Disp: 180 tablet, Rfl: 1   cloNIDine (CATAPRES) 0.1 MG tablet, Take 1 tablet (0.1 mg total) by mouth at bedtime. For hot flashes, Disp: 90 tablet, Rfl: 1   DULoxetine (CYMBALTA) 60 MG capsule, Take 1 capsule (60 mg total) by mouth 2 (two) times daily., Disp: 180 capsule, Rfl: 1   hydrALAZINE (APRESOLINE) 10 MG  tablet, Take 1 tablet (10 mg total) by mouth 3 (three) times daily., Disp: 270 tablet, Rfl: 1   Multiple Vitamins-Minerals (MULTIVITAMIN WITH MINERALS) tablet, Take 1 tablet by mouth daily., Disp: , Rfl:    omeprazole (PRILOSEC) 20 MG capsule, Take 1 capsule (20 mg total) by mouth daily., Disp: 90 capsule, Rfl: 1   oxyCODONE-acetaminophen (PERCOCET/ROXICET) 5-325 MG tablet, Take 1-2 tablets by mouth every 8 (eight) hours as needed for severe pain., Disp: 15 tablet, Rfl: 0   potassium chloride (KLOR-CON M10) 10 MEQ tablet, Take 1 tablet (10 mEq total) by mouth daily.,  Disp: 90 tablet, Rfl: 1   pregabalin (LYRICA) 150 MG capsule, Take 1 capsule (150 mg total) by mouth 3 (three) times daily., Disp: 90 capsule, Rfl: 2   Vital signs in last 24 hrs: Vitals:   08/31/22 1011  BP: 120/80  Pulse: 83   Wt Readings from Last 3 Encounters:  08/31/22 126 lb (57.2 kg)  02/24/22 129 lb (58.5 kg)  02/22/22 129 lb 3.2 oz (58.6 kg)    Physical Exam  Chronically ill-appearing.  Petite and thin as before HEENT: sclera anicteric, oral mucosa moist without lesions Neck: supple, no thyromegaly, JVD or lymphadenopathy Cardiac: Regular without appreciable murmur,  no peripheral edema Pulm: clear to auscultation bilaterally, normal RR and effort noted Abdomen: soft, mild RUQ tenderness, with active bowel sounds.  Left lobe liver enlarged and tender.  No spleen tip palpable.  Mild protuberance lower abdomen, seems like a change from last I saw her. Skin; warm and dry, no jaundice or rash  Labs:     Latest Ref Rng & Units 12/08/2021    6:07 PM 08/26/2021    9:21 AM 07/03/2019   12:11 PM  CBC  WBC 4.0 - 10.5 K/uL 15.8  13.7    Hemoglobin 12.0 - 15.0 g/dL 14.9  14.1  14.6   Hematocrit 36.0 - 46.0 % 44.7  40.7  43.0   Platelets 150 - 400 K/uL 222  199        Latest Ref Rng & Units 02/16/2022    4:01 PM 12/08/2021    6:07 PM 08/26/2021    9:21 AM  CMP  Glucose 70 - 99 mg/dL 91  114  123   BUN 6 - 24 mg/dL 4  <5  4   Creatinine 0.57 - 1.00 mg/dL 0.58  0.62  0.71   Sodium 134 - 144 mmol/L 134  137  139   Potassium 3.5 - 5.2 mmol/L 3.9  3.3  4.5   Chloride 96 - 106 mmol/L 91  94  91   CO2 20 - 29 mmol/L '28  31  30   '$ Calcium 8.7 - 10.2 mg/dL 9.2  9.3  9.1   Total Protein 6.5 - 8.1 g/dL  7.3  7.3   Total Bilirubin 0.3 - 1.2 mg/dL  0.9  0.6   Alkaline Phos 38 - 126 U/L  362  402   AST 15 - 41 U/L  155  134   ALT 0 - 44 U/L  37  49    Lab Results  Component Value Date   INR 1.1 (H) 12/24/2021   INR 1.06 12/19/2013   Last AFP normal at 4.1 on  12/24/2021 ___________________________________________ Radiologic studies: Last abdominal imaging study March 2023  CLINICAL DATA:  History of alcoholic cirrhosis with persistent abdominal pain, initial encounter   EXAM: CT ABDOMEN AND PELVIS WITH CONTRAST   TECHNIQUE: Multidetector CT imaging of the abdomen and  pelvis was performed using the standard protocol following bolus administration of intravenous contrast.   RADIATION DOSE REDUCTION: This exam was performed according to the departmental dose-optimization program which includes automated exposure control, adjustment of the mA and/or kV according to patient size and/or use of iterative reconstruction technique.   CONTRAST:  128m OMNIPAQUE IOHEXOL 300 MG/ML  SOLN   COMPARISON:  09/03/2021   FINDINGS: Lower chest: No acute abnormality.   Hepatobiliary: Fatty liver is noted. Differential enhancement is noted consistent with some cirrhotic change. No focal discrete mass is noted. Gallbladder is within normal limits.   Pancreas: Unremarkable. No pancreatic ductal dilatation or surrounding inflammatory changes.   Spleen: Normal in size without focal abnormality.   Adrenals/Urinary Tract: Adrenal glands are within normal limits. Kidneys demonstrate a normal enhancement pattern bilaterally. No renal calculi or obstructive changes are noted. Normal excretion is seen bilaterally. The bladder is well distended.   Stomach/Bowel: Colon is decompressed. No obstructive changes are seen. The appendix is within normal limits. Small bowel and stomach are unremarkable.   Vascular/Lymphatic: Vascular calcifications are seen without aneurysmal dilatation. Retroaortic left renal vein is noted. No lymphadenopathy is seen.   Reproductive: Uterus and bilateral adnexa are unremarkable. Tubal ligation clips are seen.   Other: No abdominal wall hernia or abnormality. No abdominopelvic ascites.   Musculoskeletal: No acute bony  abnormality is noted.Subcutaneous nodule is noted in the left lateral abdominal wall best seen on image number 33 of series 3 stable in appearance from the prior exam.   IMPRESSION: Changes consistent with cirrhosis of the liver. No discrete mass is noted.   No acute abnormality noted to correspond with the given clinical history.     Electronically Signed   By: MInez CatalinaM.D.   On: 12/08/2021 22:50  ____________________________________________ Other:   _____________________________________________ Assessment & Plan  Assessment: Encounter Diagnoses  Name Primary?   Alcoholic cirrhosis of liver without ascites ([place]   Upper abdominal pain    Weight loss    Alcohol abuse    She may have developed ascites based on exam.  Perceived weight loss, though based on the scale not as much she thought.  Nevertheless, concerning for the possibility of pancreatic or liver malignancy in a patient with cirrhosis who is also a smoker. Ongoing alcohol abuse, I am glad to hear she has plans for inpatient rehab soon.  She was relieved to hear that if she can eliminate alcohol, her liver condition is likely to stabilize. Even if so, she will still need ongoing regular follow-up for complications that may develop such as encephalopathy, GI bleeding or liver cancer. She was also encouraged that there has been no upper digestive varices on the recent EGD. Plan: Labs today: CBC, CMP, INR, AFP She needs a hepatitis A vaccine -first dose given today.  Second dose in about 6 months  CT scan abdomen pelvis with oral as well as with and without IV contrast to screen for liver cancer and evaluate for ascites and other potential causes of her weight loss.  I counseled her on the need for a 2000 mg or less daily sodium restriction.  Further recommendations and follow-up plans pending results of above testing.  35 minutes were spent on this encounter (including chart review, history/exam,  counseling/coordination of care, and documentation) > 50% of that time was spent on counseling and coordination of care.   HNelida MeuseIII

## 2022-08-31 ENCOUNTER — Other Ambulatory Visit (INDEPENDENT_AMBULATORY_CARE_PROVIDER_SITE_OTHER): Payer: Medicaid Other

## 2022-08-31 ENCOUNTER — Encounter: Payer: Self-pay | Admitting: Gastroenterology

## 2022-08-31 ENCOUNTER — Ambulatory Visit: Payer: Medicaid Other | Admitting: Gastroenterology

## 2022-08-31 VITALS — BP 120/80 | HR 83 | Ht 64.0 in | Wt 126.0 lb

## 2022-08-31 DIAGNOSIS — R1010 Upper abdominal pain, unspecified: Secondary | ICD-10-CM

## 2022-08-31 DIAGNOSIS — K7030 Alcoholic cirrhosis of liver without ascites: Secondary | ICD-10-CM | POA: Diagnosis not present

## 2022-08-31 DIAGNOSIS — R634 Abnormal weight loss: Secondary | ICD-10-CM

## 2022-08-31 DIAGNOSIS — Z23 Encounter for immunization: Secondary | ICD-10-CM

## 2022-08-31 DIAGNOSIS — F1010 Alcohol abuse, uncomplicated: Secondary | ICD-10-CM | POA: Diagnosis not present

## 2022-08-31 LAB — CBC WITH DIFFERENTIAL/PLATELET
Basophils Absolute: 0 10*3/uL (ref 0.0–0.1)
Basophils Relative: 0.2 % (ref 0.0–3.0)
Eosinophils Absolute: 0.2 10*3/uL (ref 0.0–0.7)
Eosinophils Relative: 1.3 % (ref 0.0–5.0)
HCT: 41.5 % (ref 36.0–46.0)
Hemoglobin: 14.3 g/dL (ref 12.0–15.0)
Lymphocytes Relative: 23 % (ref 12.0–46.0)
Lymphs Abs: 2.8 10*3/uL (ref 0.7–4.0)
MCHC: 34.3 g/dL (ref 30.0–36.0)
MCV: 105.4 fl — ABNORMAL HIGH (ref 78.0–100.0)
Monocytes Absolute: 1 10*3/uL (ref 0.1–1.0)
Monocytes Relative: 7.9 % (ref 3.0–12.0)
Neutro Abs: 8.2 10*3/uL — ABNORMAL HIGH (ref 1.4–7.7)
Neutrophils Relative %: 67.6 % (ref 43.0–77.0)
Platelets: 191 10*3/uL (ref 150.0–400.0)
RBC: 3.94 Mil/uL (ref 3.87–5.11)
RDW: 14 % (ref 11.5–15.5)
WBC: 12.2 10*3/uL — ABNORMAL HIGH (ref 4.0–10.5)

## 2022-08-31 LAB — COMPREHENSIVE METABOLIC PANEL
ALT: 31 U/L (ref 0–35)
AST: 101 U/L — ABNORMAL HIGH (ref 0–37)
Albumin: 3.9 g/dL (ref 3.5–5.2)
Alkaline Phosphatase: 294 U/L — ABNORMAL HIGH (ref 39–117)
BUN: 6 mg/dL (ref 6–23)
CO2: 35 mEq/L — ABNORMAL HIGH (ref 19–32)
Calcium: 9.5 mg/dL (ref 8.4–10.5)
Chloride: 89 mEq/L — ABNORMAL LOW (ref 96–112)
Creatinine, Ser: 0.63 mg/dL (ref 0.40–1.20)
GFR: 102.91 mL/min (ref >60.00)
Glucose, Bld: 80 mg/dL (ref 70–99)
Potassium: 3 mEq/L — ABNORMAL LOW (ref 3.5–5.1)
Sodium: 137 mEq/L (ref 135–145)
Total Bilirubin: 0.5 mg/dL (ref 0.2–1.2)
Total Protein: 7.5 g/dL (ref 6.0–8.3)

## 2022-08-31 LAB — PROTIME-INR
INR: 1 ratio (ref 0.8–1.0)
Prothrombin Time: 10.6 s (ref 9.6–13.1)

## 2022-08-31 NOTE — Patient Instructions (Addendum)
_______________________________________________________  If you are age 51 or older, your body mass index should be between 23-30. Your Body mass index is 21.63 kg/m. If this is out of the aforementioned range listed, please consider follow up with your Primary Care Provider.  If you are age 58 or younger, your body mass index should be between 19-25. Your Body mass index is 21.63 kg/m. If this is out of the aformentioned range listed, please consider follow up with your Primary Care Provider.   ________________________________________________________  The [facility] providers would like to encourage you to use [facility] to communicate with providers for non-urgent requests or questions.  Due to long hold times on the telephone, sending your provider a message by [facility] may be a faster and more efficient way to get a response.  Please allow 48 business hours for a response.  Please remember that this is for non-urgent requests.  _______________________________________________________  Your provider has requested that you go to the basement level for lab work before leaving today. Press "B" on the elevator. The lab is located at the first door on the left as you exit the elevator.  You have been scheduled for a CT scan of the abdomen and pelvis at [facility], 1st floor Radiology. You are scheduled on 09-02-2022 at 4. You should arrive at 2pm to drink your oral contrast.    1) Do not eat anything after 12/noon (4 hours prior to your test)   You may take any medications as prescribed with a small amount of water, if necessary. If you take any of the following medications: METFORMIN, GLUCOPHAGE, GLUCOVANCE, AVANDAMET, RIOMET, FORTAMET, [place] MET, JANUMET, GLUMETZA or METAGLIP, you MAY be asked to HOLD this medication 48 hours AFTER the exam.   The purpose of you drinking the oral contrast is to aid in the visualization of your intestinal tract. The contrast solution may cause some  diarrhea. Depending on your individual set of symptoms, you may also receive an intravenous injection of x-ray contrast/dye. Plan on being at [facility] for 45 minutes or longer, depending on the type of exam you are having performed.   If you have any questions regarding your exam or if you need to reschedule, you may call [facility] at [phone] between the hours of 8:00 am and 5:00 pm, Monday-Friday.   Due to recent changes in healthcare laws, you may see the results of your imaging and laboratory studies on MyChart before your provider has had a chance to review them.  We understand that in some cases there may be results that are confusing or concerning to you. Not all laboratory results come back in the same time frame and the provider may be waiting for multiple results in order to interpret others.  Please give [place] 48 hours in order for your provider to thoroughly review all the results before contacting the office for clarification of your results.   Hepatitis A vaccine dose one was given today   It was a pleasure to see you today!  Thank you for trusting me with your gastrointestinal care!

## 2022-09-01 LAB — AFP TUMOR MARKER: AFP-Tumor Marker: 3.7 ng/mL

## 2022-09-02 ENCOUNTER — Other Ambulatory Visit: Payer: Self-pay | Admitting: Gastroenterology

## 2022-09-02 ENCOUNTER — Ambulatory Visit (HOSPITAL_COMMUNITY)
Admission: RE | Admit: 2022-09-02 | Discharge: 2022-09-02 | Disposition: A | Discharge status: Home / Self Care | Payer: Medicaid Other | Source: Ambulatory Visit | Attending: Gastroenterology | Admitting: Gastroenterology

## 2022-09-02 DIAGNOSIS — K7030 Alcoholic cirrhosis of liver without ascites: Secondary | ICD-10-CM | POA: Diagnosis present

## 2022-09-02 DIAGNOSIS — R1010 Upper abdominal pain, unspecified: Secondary | ICD-10-CM | POA: Diagnosis present

## 2022-09-02 DIAGNOSIS — I700 Atherosclerosis of aorta: Secondary | ICD-10-CM | POA: Diagnosis not present

## 2022-09-02 DIAGNOSIS — R634 Abnormal weight loss: Secondary | ICD-10-CM | POA: Diagnosis present

## 2022-09-02 DIAGNOSIS — N200 Calculus of kidney: Secondary | ICD-10-CM | POA: Diagnosis not present

## 2022-09-02 DIAGNOSIS — R109 Unspecified abdominal pain: Secondary | ICD-10-CM | POA: Diagnosis not present

## 2022-09-02 DIAGNOSIS — E876 Hypokalemia: Secondary | ICD-10-CM

## 2022-09-02 MED ORDER — IOHEXOL 300 MG/ML  SOLN
100.0000 mL | Freq: Once | INTRAMUSCULAR | Status: AC | PRN
  Administered 2022-09-02: 100 mL via INTRAVENOUS

## 2022-09-02 MED ORDER — POTASSIUM CHLORIDE CRYS ER 20 MEQ PO TBCR: 40.0000 meq | EXTENDED_RELEASE_TABLET | Freq: Every day | ORAL | 0 refills | Status: DC

## 2022-09-07 ENCOUNTER — Other Ambulatory Visit: Payer: Self-pay | Admitting: Family Medicine

## 2022-09-07 ENCOUNTER — Encounter: Payer: Self-pay | Admitting: Family Medicine

## 2022-09-07 DIAGNOSIS — E878 Other disorders of electrolyte and fluid balance, not elsewhere classified: Secondary | ICD-10-CM

## 2022-09-08 ENCOUNTER — Ambulatory Visit: Payer: Medicaid Other | Admitting: Podiatry

## 2022-09-08 ENCOUNTER — Ambulatory Visit (INDEPENDENT_AMBULATORY_CARE_PROVIDER_SITE_OTHER): Payer: Medicaid Other

## 2022-09-08 DIAGNOSIS — M2041 Other hammer toe(s) (acquired), right foot: Secondary | ICD-10-CM | POA: Diagnosis not present

## 2022-09-08 MED ORDER — OXYCODONE-ACETAMINOPHEN 5-325 MG PO TABS: 1.0000 | ORAL_TABLET | Freq: Three times a day (TID) | ORAL | 0 refills | Status: DC | PRN

## 2022-09-08 NOTE — Progress Notes (Signed)
Subjective: Chief Complaint  Patient presents with   Follow-up    Follow up after surgery, still experiencing soreness in 2nd digit, right foot     [name] is a 51 y.o. is seen today in office s/p right second digit hammertoe , second MPJ capsulotomy preformed on 06/22/2022.  States she is doing better but still having pain.  She still takes pain medicine intermittently.  She has stiffness along the tendon still has swelling.  No recent injuries that she reports.  She is still wearing surgical shoe.   Objective: General: No acute distress, AAOx3  DP/PT pulses palpable 2/4, CRT < 3 sec to all digits.  Protective sensation intact. Motor function intact.  Right foot: Incision is well coapted without any evidence of dehiscence and scar has formed.  There is mild edema there is no erythema or warmth.  No obvious signs of infection.  Clinically the toe sits rectus PIPJ except slightly dorsiflex at the MPJ. No pain with calf compression, swelling, warmth, erythema.   Assessment and Plan:  Status post right second digit hammertoe surgery, doing well with no complications   -Treatment options discussed including all alternatives, risks, and complications -X-rays were obtained reviewed of the right foot.  3 views were obtained.  Previous plate fixation along second metatarsal.  Status post hammertoe repair of the second digit. -Restart physical therapy to work range of motion particularly along the MPJ.  Continue surgical shoe for now but she can transition to regular shoe as tolerated.  Continue ice, elevate.  Continue with the splint to help hold the toe down. -Refilled pain medication we discussed weaning off of this or switching to something different should she needs any further refill.  [name], [name]

## 2022-09-11 ENCOUNTER — Other Ambulatory Visit: Payer: Self-pay | Admitting: Family Medicine

## 2022-09-11 DIAGNOSIS — F419 Anxiety disorder, unspecified: Secondary | ICD-10-CM

## 2022-09-11 DIAGNOSIS — K219 Gastro-esophageal reflux disease without esophagitis: Secondary | ICD-10-CM

## 2022-09-12 HISTORY — PX: ABDOMINAL HYSTERECTOMY: SHX81

## 2022-09-13 NOTE — Telephone Encounter (Signed)
Requested Prescriptions  Pending Prescriptions Disp Refills   busPIRone (BUSPAR) 5 MG tablet [Pharmacy Med Name: BUSPIRONE HCL 5 MG TABLET] 180 tablet 0    Sig: TAKE 1 TABLET BY MOUTH TWICE A DAY     Psychiatry: Anxiolytics/Hypnotics - Non-controlled Passed - 09/11/2022  6:42 PM      Passed - Valid encounter within last 12 months    Recent Outpatient Visits           6 months ago Hypokalemia   [place], Charlane Ferretti, MD   1 year ago Left upper quadrant abdominal mass   [facility] Charlott Rakes, MD   1 year ago Annual physical exam   [facility] Charlott Rakes, MD   2 years ago Essential hypertension   [place], Charlane Ferretti, MD   2 years ago Suspected COVID-19 virus infection   [place], [place], MD               omeprazole (PRILOSEC) 20 MG capsule [Pharmacy Med Name: OMEPRAZOLE DR 20 MG CAPSULE] 90 capsule 0    Sig: TAKE 1 CAPSULE BY MOUTH EVERY DAY     Gastroenterology: Proton Pump Inhibitors Passed - 09/11/2022  6:42 PM      Passed - Valid encounter within last 12 months    Recent Outpatient Visits           6 months ago Hypokalemia   [place], [place], MD   1 year ago Left upper quadrant abdominal mass   [facility] Charlott Rakes, MD   1 year ago Annual physical exam   [facility] Charlott Rakes, MD   2 years ago Essential hypertension   [place], [place]ong, MD   2 years ago Suspected COVID-19 virus infection   [facility] Charlott Rakes, MD

## 2022-09-15 ENCOUNTER — Other Ambulatory Visit: Payer: Self-pay

## 2022-09-15 ENCOUNTER — Ambulatory Visit: Payer: Medicaid Other | Attending: Podiatry | Admitting: Physical Therapy

## 2022-09-15 ENCOUNTER — Encounter: Payer: Self-pay | Admitting: Physical Therapy

## 2022-09-15 DIAGNOSIS — R2689 Other abnormalities of gait and mobility: Secondary | ICD-10-CM | POA: Diagnosis not present

## 2022-09-15 DIAGNOSIS — M6281 Muscle weakness (generalized): Secondary | ICD-10-CM | POA: Insufficient documentation

## 2022-09-15 DIAGNOSIS — M2041 Other hammer toe(s) (acquired), right foot: Secondary | ICD-10-CM | POA: Insufficient documentation

## 2022-09-15 DIAGNOSIS — M79671 Pain in right foot: Secondary | ICD-10-CM | POA: Insufficient documentation

## 2022-09-15 NOTE — Patient Instructions (Signed)
Access Code: [email or web address] URL: [email or web address] Date: 09/15/2022 Prepared by: Hilda Blades  Exercises - Long Sitting Calf Stretch with Strap  - 2 x daily - 3 reps - 30 seconds hold - Long Sitting Ankle Plantar Flexion with Resistance  - 1-2 x daily - 2 sets - 10 reps - Long Sitting Ankle Eversion with Resistance  - 1-2 x daily - 2 sets - 10 reps - Long Sitting Ankle Inversion with Resistance  - 1-2 x daily - 2 sets - 10 reps - Seated Toe Flexion Extension PROM  - 2 x daily - 10 reps - 5 seconds hold - Seated Toe Towel Scrunches  - 1-2 x daily - 2 sets - 20 reps - Seated Heel Toe Raises  - 1-2 x daily - 2 sets - 10 reps

## 2022-09-15 NOTE — Therapy (Signed)
OUTPATIENT PHYSICAL THERAPY EVALUATION   Patient Name: [name] MRN: [id number] DOB:Oct 29, 1970, 52 y.o., female Today's Date: 09/16/2022  END OF SESSION:  PT End of Session - 09/15/22 1532     Visit Number 1    Number of Visits 7    Date for PT Re-Evaluation 10/27/22    Authorization Type [employer]    PT Start Time 1531    PT Stop Time 1615    PT Time Calculation (min) 44 min    Activity Tolerance Patient tolerated treatment well    Behavior During Therapy WFL for tasks assessed/performed             Past Medical History:  Diagnosis Date   Anxiety    Arthritis    right foot   Cirrhosis ([place])    GERD (gastroesophageal reflux disease)    History of alcoholism ([place])    History of cellulitis    2015  right side face   Hypertension    followed by pcp  (per pt never had stress test)   Malnutrition ([place])    Metatarsal fracture    right 2nd   Peripheral neuropathy    feet   Strachan's syndrome    painful neuropathy   Wears dentures    upper   Wears glasses    Past Surgical History:  Procedure Laterality Date   BREAST BIOPSY     MULTIPLE EXTRACTIONS WITH ALVEOLOPLASTY N/A 12/25/2013   Procedure: Extraction of tooth #'s 2,3,4,5,6,8,9,10,11,12,13,14,15,20,21,22,23,24,25,26,27, 50,27,74,12 with alveoloplasty and bilateral mandibular tori reductions;  Surgeon: Lenn Cal, DDS;  Location: [facility];  Service: Oral Surgery;  Laterality: N/A;   ORIF TOE FRACTURE Right 07/03/2019   Procedure: OPEN REDUCTION INTERNAL FIXATION (ORIF) METATARSAL (TOE) FRACTURE;  Surgeon: Trula Slade, [name];  Location: [facility];  Service: Podiatry;  Laterality: Right;  LEG BLOCK   SURAL NERVE BX Right 12/24/2013   Procedure: SURAL NERVE BIOPSY;  Surgeon: Kristeen Miss, MD;  Location: [facility];  Service: Neurosurgery;  Laterality: Right;   TUBAL LIGATION Bilateral 2002   PPTL   Patient Active Problem List   Diagnosis Date Noted   Alcohol abuse  02/16/2022   Hyperlipidemia 02/16/2022   Subcutaneous nodule of abdominal wall 10/21/2021   Cirrhosis ([place]) 10/21/2021   Tobacco abuse 03/19/2019   Vitamin D deficiency 10/25/2018   Closed fracture of bone of right foot 07/25/2018   Insomnia 10/02/2017   Ingrown toenail 04/05/2017   Closed nondisplaced fracture of proximal phalanx of lesser toe of right foot 04/05/2017   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   Peripheral neuropathy 12/24/2013   Lower extremity pain, bilateral 12/18/2013    PCP: Charlott Rakes, MD  REFERRING PROVIDER: Trula Slade, [name]  REFERRING DIAG: Hammertoe of right foot  THERAPY DIAG:  Pain in right foot  Muscle weakness (generalized)  Other abnormalities of gait and mobility  Rationale for Evaluation and Treatment: Rehabilitation  ONSET DATE: 03/13/2022, DOS 06/22/2022   SUBJECTIVE:  SUBJECTIVE STATEMENT: Patient reports she has had 3 surgeries on her right foot and she recently broke her 2nd met and had the last surgery. She is walking with a normal shoe for the first time today, and her foot has been sore. She has been in a boot. She notes her whole foot and ankle feel stiff and tight, especially around the 2nd toe.  PERTINENT HISTORY: Multiple right foot / toe surgeries, bilateral LE and foot neuropathy  PAIN:  Are you  having pain? Yes:  NPRS scale: 0/10, 7/10 with walking Pain location: Right foot / 2nd met Pain description: Stiff, sore Aggravating factors: Walking, standing Relieving factors: Sitting  PRECAUTIONS: None  WEIGHT BEARING RESTRICTIONS: No  FALLS:  Has patient fallen in last 6 months? No  PLOF: Independent  PATIENT GOALS: Pain relief, improve walking   OBJECTIVE:  PATIENT SURVEYS:  LEFS 27/80  COGNITION: Overall cognitive status: Within functional limits for tasks assessed     SENSATION: Patient reports sensation deficits on lateral aspect of right foot  EDEMA:  Mild edema observed at  dorsal aspect of 2nd MTP region  MUSCLE LENGTH: Calf flexibility deficit  PALPATION: Tender to palpation   LOWER EXTREMITY ROM:  Active ROM Right eval Left eval  Ankle dorsiflexion 0 7  Ankle plantarflexion 45 65  Ankle inversion 30 45  Ankle eversion 3 10   Patient demonstrates limited motion of 2nd MTP and IP joints  LOWER EXTREMITY MMT:  MMT Right eval Left eval  Ankle dorsiflexion 4 5  Ankle plantarflexion 2 5  Ankle inversion 4 5  Ankle eversion 4 5   FUNCTIONAL TESTS:  SLS: < 5 seconds on right  GAIT: Assistive device utilized: None Level of assistance: Complete Independence Comments: antalgic on right, patient ambulating in regular shoes   TODAY'S TREATMENT:       [facility] Adult PT Treatment:                                                DATE: 09/15/2022 Therapeutic Exercise: Longsitting calf stretch with towel 2 x 30 sec Ankle PF, inversion, eversion with red x 10 each Seated figure-4 position toe stretching 5 x 5 sec Seated towel scrunch x 10 Seated heel - toe raises x 10  PATIENT EDUCATION:  Education details: Exam findings, POC, HEP Person educated: Patient Education method: Explanation, Demonstration, Tactile cues, Verbal cues, and Handouts Education comprehension: verbalized understanding, returned demonstration, verbal cues required, tactile cues required, and needs further education  HOME EXERCISE PROGRAM: Access Code: [email or web address]    ASSESSMENT: CLINICAL IMPRESSION: Patient is a 52 y.o. female who was seen today for physical therapy evaluation and treatment for right foot pain and stiffness following right 2nd toe surgery.   OBJECTIVE IMPAIRMENTS: Abnormal gait, decreased activity tolerance, decreased balance, decreased ROM, decreased strength, increased edema, impaired flexibility, and pain.   ACTIVITY LIMITATIONS: standing and locomotion level  PARTICIPATION LIMITATIONS: meal prep, cleaning, shopping, community activity, and  occupation  PERSONAL FACTORS: Past/current experiences and Time since onset of injury/illness/exacerbation are also affecting patient's functional outcome.   REHAB POTENTIAL: Good  CLINICAL DECISION MAKING: Stable/uncomplicated  EVALUATION COMPLEXITY: Low   GOALS: Goals reviewed with patient? Yes  SHORT TERM GOALS: Target date: 10/06/2022  Patient will be I with initial HEP in order to progress with therapy. Baseline: HEP provided at eval Goal status: INITIAL  2.  Patient will demonstrate proper gait mechanics with normal shoes in order to improve ambulation and community access Baseline: antalgic on right Goal status: INITIAL  3.  Patient will report right foot pain with walking </= 4/10 in order to reduce functional limitations and improve ability to perform household tasks Baseline: 7/10 pain with walking Goal status: INITIAL  LONG TERM GOALS: Target date: 10/27/2022  Patient will be I with final HEP to maintain progress from PT. Baseline: HEP provided at eval  Goal status: INITIAL  2.  Patient will demonstrate right ankle DF AROM >/= 8 deg to improve walking ability and stair negotiation Baseline: right ankle DF AROM 0 deg Goal status: INITIAL  3.  Patient will demonstrate right ankle strength 5/5 MMT to improve tolerance for standing tasks and stability for balance and walking on unstable surfaces. Baseline: patient exhibits grosss strength deficit of right ankle noted above Goal status: INITIAL  4. Patient will report functional ability on LEFS >/= 55/80 in order to indicate improved ability to perform daily and household tasks  Baseline: 27/80  Goal Status: INITIAL   PLAN: PT FREQUENCY: 1x/week  PT DURATION: 6 weeks  PLANNED INTERVENTIONS: Therapeutic exercises, Therapeutic activity, Neuromuscular re-education, Balance training, Gait training, Patient/Family education, Self Care, Joint mobilization, Joint manipulation, Aquatic Therapy, Dry Needling, Manual  therapy, and Re-evaluation  PLAN FOR NEXT SESSION: Review HEP and progress PRN, manual/stretching for right ankle/foot/toes, progress ankle strengthening, gait training, progress to closed chain ankle/foot strengthening and balance training as tolerated   Hilda Blades, PT, DPT, LAT, ATC 09/16/22  11:55 AM Phone: [phone] Fax: [phone]    Check all possible CPT codes: 97164 - PT Re-evaluation, 97110- Therapeutic Exercise, [phone]- Neuro Re-education, [phone] - Gait Training, [phone] - Manual Therapy, 97530 - Therapeutic Activities, [phone] - [place], and [phone] - Aquatic therapy    Check all conditions that are expected to impact treatment: Musculoskeletal disorders   If treatment provided at initial evaluation, no treatment charged due to lack of authorization.

## 2022-09-16 ENCOUNTER — Encounter: Payer: Self-pay | Admitting: Neurology

## 2022-09-16 ENCOUNTER — Ambulatory Visit: Payer: Medicaid Other | Admitting: Neurology

## 2022-09-16 ENCOUNTER — Other Ambulatory Visit: Payer: Self-pay | Admitting: Podiatry

## 2022-09-16 VITALS — BP 121/68 | HR 93 | Ht 64.0 in | Wt 124.0 lb

## 2022-09-16 DIAGNOSIS — G6289 Other specified polyneuropathies: Secondary | ICD-10-CM

## 2022-09-16 MED ORDER — PREGABALIN 150 MG PO CAPS: 150.0000 mg | ORAL_CAPSULE | Freq: Three times a day (TID) | ORAL | 5 refills | Status: DC

## 2022-09-16 MED ORDER — OXYCODONE-ACETAMINOPHEN 5-325 MG PO TABS: 1.0000 | ORAL_TABLET | Freq: Three times a day (TID) | ORAL | 0 refills | Status: DC | PRN

## 2022-09-16 NOTE — Patient Instructions (Signed)
Return to clinic in 9 months

## 2022-09-16 NOTE — Progress Notes (Signed)
Follow-up Visit   Date: 09/16/22    TAHIRIH LAIR MRN: [id number] DOB: Apr 03, 1971   Interim History: [name] is a 52 y.o. 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  The patient was accompanied to the clinic by self.  She is here for follow-up visit.  Since her last visit, nortriptyline was discontinued and Lyrica increased to '150mg'$  three times daily.  She feels that neuropathy is better controlled on higher dose of Lyrica.  She is motivated to stop alcohol and is aware that she needs help.  She is planning to go to rehab this month.  Medications:  Current Outpatient Medications on File Prior to Visit  Medication Sig Dispense Refill   amLODipine (NORVASC) 5 MG tablet Take 1 tablet (5 mg total) by mouth daily. 90 tablet 1   busPIRone (BUSPAR) 5 MG tablet TAKE 1 TABLET BY MOUTH TWICE A DAY 180 tablet 0   cloNIDine (CATAPRES) 0.1 MG tablet Take 1 tablet (0.1 mg total) by mouth at bedtime. For hot flashes 90 tablet 1   DULoxetine (CYMBALTA) 60 MG capsule Take 1 capsule (60 mg total) by mouth 2 (two) times daily. 180 capsule 1   hydrALAZINE (APRESOLINE) 10 MG tablet Take 1 tablet (10 mg total) by mouth 3 (three) times daily. 270 tablet 1   Multiple Vitamins-Minerals (MULTIVITAMIN WITH MINERALS) tablet Take 1 tablet by mouth daily.     omeprazole (PRILOSEC) 20 MG capsule TAKE 1 CAPSULE BY MOUTH EVERY DAY 90 capsule 0   oxyCODONE-acetaminophen (PERCOCET/ROXICET) 5-325 MG tablet Take 1-2 tablets by mouth every 8 (eight) hours as needed for severe pain. 15 tablet 0   potassium chloride SA (KLOR-CON M) 20 MEQ tablet Take 2 tablets (40 mEq total) by mouth daily. 30 tablet 0   No current facility-administered medications on file prior to visit.    Allergies:  Allergies  Allergen Reactions   Macrobid [Nitrofurantoin Macrocrystal] Anaphylaxis and  Other (See Comments)    Patient reports 2 syncope episodes after her mouth became numb     Vital Signs:  BP 121/68   Pulse 93   Ht '5\' 4"'$  (1.626 m)   Wt 124 lb (56.2 kg)   LMP  (LMP Unknown)   SpO2 97%   BMI 21.28 kg/m    Neurological Exam: MENTAL STATUS including orientation to time, place, person, recent and remote memory, attention span and concentration, language, and fund of knowledge is normal.  Speech is not dysarthric.  MOTOR:  Motor strength is 5/5 in all extremities.  Generalized loss of muscle bulk throughout, fasciculations or abnormal movements.  No pronator drift.  Tone is normal.    MSRs:  Right                                                                 Left brachioradialis 2+  brachioradialis 2+  biceps 2+  biceps 2+  triceps 2+  triceps 2+  patellar 1+  Patellar 1+  ankle jerk 0  ankle jerk 0   SENSORY:  Intact to vibration is intact at the hands, absent below the knees.  Temperature reduced over the hands, thigh, and absent below the knees.  COORDINATION/GAIT:   Gait appears mildly unsteady, unassisted.    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 EMG evaluation of the right lower extremity was relatively unremarkable. Labs 12/2013:  ANA negative. RPR nonreactive, HIV nonreactive, ESR only at 5, rheumatoid factor negative, copper 127, Vitamin B12 at 381. 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  Nutritional deficiency  - Continue vitamin B12 1063mg, vitamin B1 '100mg'$ ,  and folic '1mg'$  daily  Return to clinic in 9 months  Total time spent reviewing records, interview, history/exam, documentation, and coordination of care on day of encounter:  20 min   Thank you for allowing me to participate in patient's care.  If I can answer any additional questions, I would be pleased to do so.    Sincerely,    Chanee Henrickson K. PPosey Pronto DO

## 2022-09-19 ENCOUNTER — Encounter (HOSPITAL_BASED_OUTPATIENT_CLINIC_OR_DEPARTMENT_OTHER): Payer: Medicaid Other | Admitting: Family Medicine

## 2022-09-19 DIAGNOSIS — N9489 Other specified conditions associated with female genital organs and menstrual cycle: Secondary | ICD-10-CM | POA: Diagnosis not present

## 2022-09-19 NOTE — Therapy (Addendum)
OUTPATIENT PHYSICAL THERAPY TREATMENT NOTE  DISCHARGE   Patient Name: [name] MRN: [id number] DOB:1970-09-26, 52 y.o., female Today's Date: 09/21/2022  PCP: Hoy Register, MD REFERRING PROVIDER: Vivi Barrack, [name]   END OF SESSION:   PT End of Session - 09/21/22 1542     Visit Number 2    Number of Visits 7    Date for PT Re-Evaluation 10/27/22    Authorization Type [employer]    Authorization Time Period 09/20/2022 - 11/18/2022    Authorization - Visit Number 1    Authorization - Number of Visits 7    PT Start Time 1541    PT Stop Time 1615    PT Time Calculation (min) 34 min    Activity Tolerance Patient tolerated treatment well    Behavior During Therapy WFL for tasks assessed/performed             Past Medical History:  Diagnosis Date   Anxiety    Arthritis    right foot   Cirrhosis (HCC)    GERD (gastroesophageal reflux disease)    History of alcoholism (HCC)    History of cellulitis    2015  right side face   Hypertension    followed by pcp  (per pt never had stress test)   Malnutrition (HCC)    Metatarsal fracture    right 2nd   Peripheral neuropathy    feet   Strachan's syndrome    painful neuropathy   Wears dentures    upper   Wears glasses    Past Surgical History:  Procedure Laterality Date   BREAST BIOPSY     MULTIPLE EXTRACTIONS WITH ALVEOLOPLASTY N/A 12/25/2013   Procedure: Extraction of tooth #'s 2,3,4,5,6,8,9,10,11,12,13,14,15,20,21,22,23,24,25,26,27, 40,98,11,91 with alveoloplasty and bilateral mandibular tori reductions;  Surgeon: Charlynne Pander, DDS;  Location: [facility] OR;  Service: Oral Surgery;  Laterality: N/A;   ORIF TOE FRACTURE Right 07/03/2019   Procedure: OPEN REDUCTION INTERNAL FIXATION (ORIF) METATARSAL (TOE) FRACTURE;  Surgeon: Vivi Barrack, [name];  Location: [facility];  Service: Podiatry;  Laterality: Right;  LEG BLOCK   SURAL NERVE BX Right 12/24/2013   Procedure: SURAL NERVE BIOPSY;   Surgeon: Barnett Abu, MD;  Location: [facility];  Service: Neurosurgery;  Laterality: Right;   TUBAL LIGATION Bilateral 2002   PPTL   Patient Active Problem List   Diagnosis Date Noted   Alcohol abuse 02/16/2022   Hyperlipidemia 02/16/2022   Subcutaneous nodule of abdominal wall 10/21/2021   Cirrhosis (HCC) 10/21/2021   Tobacco abuse 03/19/2019   Vitamin D deficiency 10/25/2018   Closed fracture of bone of right foot 07/25/2018   Insomnia 10/02/2017   Ingrown toenail 04/05/2017   Closed nondisplaced fracture of proximal phalanx of lesser toe of right foot 04/05/2017   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   Peripheral neuropathy 12/24/2013   Lower extremity pain, bilateral 12/18/2013    REFERRING DIAG: Hammertoe of right foot   THERAPY DIAG:  Pain in right foot  Muscle weakness (generalized)  Other abnormalities of gait and mobility  Rationale for Evaluation and Treatment Rehabilitation  PERTINENT HISTORY: Multiple right foot / toe surgeries, bilateral LE and foot neuropathy   PRECAUTIONS: None   SUBJECTIVE:  SUBJECTIVE STATEMENT:  Patient reports her foot is still feeling tight  PAIN:  Are you having pain? Yes:  NPRS scale: 0/10, 7/10 with walking Pain location: Right foot / 2nd met Pain description: Stiff, sore Aggravating factors: Walking, standing Relieving factors: Sitting   OBJECTIVE: (objective measures completed at initial evaluation unless otherwise dated) PATIENT SURVEYS:  LEFS 27/80     SENSATION: Patient reports sensation deficits on lateral aspect of right foot   EDEMA:  Mild edema observed at dorsal aspect of 2nd MTP region   MUSCLE LENGTH: Calf flexibility deficit   PALPATION: Tender to palpation    LOWER EXTREMITY ROM:   Active ROM  Right eval Left eval  Ankle dorsiflexion 0 7  Ankle plantarflexion 45 65  Ankle inversion 30 45  Ankle eversion 3 10    Patient demonstrates limited motion of 2nd MTP and IP joints   LOWER EXTREMITY MMT:   MMT Right eval Left eval  Ankle dorsiflexion 4 5  Ankle plantarflexion 2 5  Ankle inversion 4 5  Ankle eversion 4 5    FUNCTIONAL TESTS:  SLS: < 5 seconds on right   GAIT: Assistive device utilized: None Level of assistance: Complete Independence Comments: antalgic on right, patient ambulating in regular shoes     TODAY'S TREATMENT:       [facility] Adult PT Treatment:                                                DATE: 09/21/2022 Therapeutic Exercise: Longsitting calf stretch with towel 2 x 30 sec Slant board calf stretch 3 x 30 sec 4-way banded ankle with red 2 x 15 Standing heel raises on Airex 2 x 15 Manual:  Right foot intertarsal mobs to improve foot mobility Right calf and plantar fascia STM Longsitting and prone knee bent    [facility] Adult PT Treatment:                                                DATE: 09/15/2022 Therapeutic Exercise: Longsitting calf stretch with towel 2 x 30 sec Ankle PF, inversion, eversion with red x 10 each Seated figure-4 position toe stretching 5 x 5 sec Seated towel scrunch x 10 Seated heel - toe raises x 10   PATIENT EDUCATION:  Education details: HEP Person educated: Patient Education method: Programmer, multimedia, Demonstration, Actor cues, Verbal cues Education comprehension: verbalized understanding, returned demonstration, verbal cues required, tactile cues required, and needs further education   HOME EXERCISE PROGRAM: Access Code: [email or web address]      ASSESSMENT: CLINICAL IMPRESSION: Patient tolerated therapy well with no adverse effects. Therapy focused on improve foot and ankle mobility, and progressing her ankle strength with good tolerance. She did arrive late so therapy limited on time. Incorporated manual to improve mobility and  she did well with more weight bearing stretching and strengthening exercises. She does demonstrate a slight improvement in ankle dorsiflexion this visit. Patient would benefit from continued skilled PT to progress her mobility and strength in order to reduce pain and maximize functional ability.    OBJECTIVE IMPAIRMENTS: Abnormal gait, decreased activity tolerance, decreased balance, decreased ROM, decreased strength, increased edema, impaired flexibility, and pain.    ACTIVITY LIMITATIONS: standing and locomotion level  PARTICIPATION LIMITATIONS: meal prep, cleaning, shopping, community activity, and occupation   PERSONAL FACTORS: Past/current experiences and Time since onset of injury/illness/exacerbation are also affecting patient's functional outcome.      GOALS: Goals reviewed with patient? Yes   SHORT TERM GOALS: Target date: 10/06/2022   Patient will be I with initial HEP in order to progress with therapy. Baseline: HEP provided at eval Goal status: INITIAL   2.  Patient will demonstrate proper gait mechanics with normal shoes in order to improve ambulation and community access Baseline: antalgic on right Goal status: INITIAL   3.  Patient will report right foot pain with walking </= 4/10 in order to reduce functional limitations and improve ability to perform household tasks Baseline: 7/10 pain with walking Goal status: INITIAL   LONG TERM GOALS: Target date: 10/27/2022   Patient will be I with final HEP to maintain progress from PT. Baseline: HEP provided at eval Goal status: INITIAL   2.  Patient will demonstrate right ankle DF AROM >/= 8 deg to improve walking ability and stair negotiation Baseline: right ankle DF AROM 0 deg Goal status: INITIAL   3.  Patient will demonstrate right ankle strength 5/5 MMT to improve tolerance for standing tasks and stability for balance and walking on unstable surfaces. Baseline: patient exhibits grosss strength deficit of right ankle  noted above Goal status: INITIAL   4. Patient will report functional ability on LEFS >/= 55/80 in order to indicate improved ability to perform daily and household tasks            Baseline: 27/80            Goal Status: INITIAL     PLAN: PT FREQUENCY: 1x/week   PT DURATION: 6 weeks   PLANNED INTERVENTIONS: Therapeutic exercises, Therapeutic activity, Neuromuscular re-education, Balance training, Gait training, Patient/Family education, Self Care, Joint mobilization, Joint manipulation, Aquatic Therapy, Dry Needling, Manual therapy, and Re-evaluation   PLAN FOR NEXT SESSION: Review HEP and progress PRN, manual/stretching for right ankle/foot/toes, progress ankle strengthening, gait training, progress to closed chain ankle/foot strengthening and balance training as tolerated   Rosana Hoes, PT, DPT, LAT, ATC 09/21/22  4:43 PM Phone: [phone] Fax: [phone]     PHYSICAL THERAPY DISCHARGE SUMMARY  Visits from Start of Care: 2  Current functional level related to goals / functional outcomes: See above   Remaining deficits: See above   Education / Equipment: HEP   Patient agrees to discharge. Patient goals were not met. Patient is being discharged due to not returning since the last visit.  Rosana Hoes, PT, DPT, LAT, ATC 01/12/23  9:30 AM Phone: [phone] Fax: [phone]

## 2022-09-19 NOTE — Telephone Encounter (Signed)

## 2022-09-20 ENCOUNTER — Other Ambulatory Visit: Payer: Self-pay | Admitting: Gastroenterology

## 2022-09-21 ENCOUNTER — Ambulatory Visit: Payer: Medicaid Other | Admitting: Physical Therapy

## 2022-09-21 ENCOUNTER — Other Ambulatory Visit: Payer: Self-pay

## 2022-09-21 ENCOUNTER — Encounter: Payer: Self-pay | Admitting: Physical Therapy

## 2022-09-21 DIAGNOSIS — M6281 Muscle weakness (generalized): Secondary | ICD-10-CM | POA: Diagnosis not present

## 2022-09-21 DIAGNOSIS — R2689 Other abnormalities of gait and mobility: Secondary | ICD-10-CM

## 2022-09-21 DIAGNOSIS — M2041 Other hammer toe(s) (acquired), right foot: Secondary | ICD-10-CM | POA: Diagnosis not present

## 2022-09-21 DIAGNOSIS — M79671 Pain in right foot: Secondary | ICD-10-CM | POA: Diagnosis not present

## 2022-09-22 ENCOUNTER — Other Ambulatory Visit: Payer: Self-pay | Admitting: Podiatry

## 2022-09-22 MED ORDER — OXYCODONE-ACETAMINOPHEN 5-325 MG PO TABS: 1.0000 | ORAL_TABLET | Freq: Three times a day (TID) | ORAL | 0 refills | Status: DC | PRN

## 2022-09-23 ENCOUNTER — Other Ambulatory Visit: Payer: Self-pay | Admitting: Family Medicine

## 2022-09-23 DIAGNOSIS — I10 Essential (primary) hypertension: Secondary | ICD-10-CM

## 2022-09-23 NOTE — Telephone Encounter (Signed)
Requested Prescriptions  Pending Prescriptions Disp Refills   hydrALAZINE (APRESOLINE) 10 MG tablet [Pharmacy Med Name: HYDRALAZINE 10 MG TABLET] 270 tablet 1    Sig: TAKE 1 TABLET BY MOUTH THREE TIMES A DAY     Cardiovascular:  Vasodilators Failed - 09/23/2022  8:36 AM      Failed - WBC in normal range and within 360 days    WBC  Date Value Ref Range Status  08/31/2022 12.2 (H) 4.0 - 10.5 K/uL Final         Failed - ANA Screen, Ifa, Serum in normal range and within 360 days    Anti Nuclear Antibody (ANA)  Date Value Ref Range Status  12/24/2021 POSITIVE (A) NEGATIVE Final    Comment:    ANA IFA is a first line screen for detecting the presence of up to approximately 150 autoantibodies in various autoimmune diseases. A positive ANA IFA result is suggestive of autoimmune disease and reflexes to titer and pattern. Further laboratory testing may be considered if clinically indicated. . For additional information, please refer to http://education.[facility].com/faq/FAQ177 (This link is being provided for informational/ educational purposes only.) .    ANA Titer 1  Date Value Ref Range Status  12/24/2021 1:40 (H) titer Final    Comment:    A low level ANA titer may be present in pre-clinical autoimmune diseases and normal individuals.                 Reference Range                 <1:40        Negative                 1:40-1:80    Low Antibody Level                 >1:80        Elevated Antibody Level .          Passed - HCT in normal range and within 360 days    HCT  Date Value Ref Range Status  08/31/2022 41.5 36.0 - 46.0 % Final   Hematocrit  Date Value Ref Range Status  08/26/2021 40.7 34.0 - 46.6 % Final         Passed - HGB in normal range and within 360 days    Hemoglobin  Date Value Ref Range Status  08/31/2022 14.3 12.0 - 15.0 g/dL Final  08/26/2021 14.1 11.1 - 15.9 g/dL Final         Passed - RBC in normal range and within 360 days    RBC   Date Value Ref Range Status  08/31/2022 3.94 3.87 - 5.11 Mil/uL Final         Passed - PLT in normal range and within 360 days    Platelets  Date Value Ref Range Status  08/31/2022 191.0 150.0 - 400.0 K/uL Final  08/26/2021 199 150 - 450 x10E3/uL Final         Passed - Last BP in normal range    BP Readings from Last 1 Encounters:  09/16/22 121/68         Passed - Valid encounter within last 12 months    Recent Outpatient Visits           7 months ago Hypokalemia   [place], Charlane Ferretti, MD   1 year ago Left upper quadrant abdominal mass   [facility] Charlott Rakes, MD   1 year ago Annual physical exam   [place], Enobong, MD   2 years ago Essential hypertension   [place], Enobong, MD   2 years ago Suspected COVID-19 virus infection   [place] [facility] Charlott Rakes, MD

## 2022-09-27 NOTE — Therapy (Incomplete)
OUTPATIENT PHYSICAL THERAPY TREATMENT NOTE   Patient Name: [name] MRN: [id number] DOB:March 24, 1971, 52 y.o., female Today's Date: 09/27/2022  PCP: Charlott Rakes, MD REFERRING PROVIDER: Trula Slade, [name]   END OF SESSION:     Past Medical History:  Diagnosis Date   Anxiety    Arthritis    right foot   Cirrhosis ([place])    GERD (gastroesophageal reflux disease)    History of alcoholism ([place])    History of cellulitis    2015  right side face   Hypertension    followed by pcp  (per pt never had stress test)   Malnutrition ([place])    Metatarsal fracture    right 2nd   Peripheral neuropathy    feet   Strachan's syndrome    painful neuropathy   Wears dentures    upper   Wears glasses    Past Surgical History:  Procedure Laterality Date   BREAST BIOPSY     MULTIPLE EXTRACTIONS WITH ALVEOLOPLASTY N/A 12/25/2013   Procedure: Extraction of tooth #'s 2,3,4,5,6,8,9,10,11,12,13,14,15,20,21,22,23,24,25,26,27, 62,69,48,54 with alveoloplasty and bilateral mandibular tori reductions;  Surgeon: Lenn Cal, DDS;  Location: [place];  Service: Oral Surgery;  Laterality: N/A;   ORIF TOE FRACTURE Right 07/03/2019   Procedure: OPEN REDUCTION INTERNAL FIXATION (ORIF) METATARSAL (TOE) FRACTURE;  Surgeon: Trula Slade, [name];  Location: [place];  Service: Podiatry;  Laterality: Right;  LEG BLOCK   SURAL NERVE BX Right 12/24/2013   Procedure: SURAL NERVE BIOPSY;  Surgeon: Kristeen Miss, MD;  Location: [place] [facility];  Service: Neurosurgery;  Laterality: Right;   TUBAL LIGATION Bilateral 2002   PPTL   Patient Active Problem List   Diagnosis Date Noted   Alcohol abuse 02/16/2022   Hyperlipidemia 02/16/2022   Subcutaneous nodule of abdominal wall 10/21/2021   Cirrhosis ([place]) 10/21/2021   Tobacco abuse 03/19/2019   Vitamin D deficiency 10/25/2018   Closed fracture of bone of right foot 07/25/2018   Insomnia 10/02/2017   Ingrown toenail 04/05/2017   Closed  nondisplaced fracture of proximal phalanx of lesser toe of right foot 04/05/2017   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  Rationale for Evaluation and Treatment Rehabilitation  PERTINENT HISTORY: Multiple right foot / toe surgeries, bilateral LE and foot neuropathy   PRECAUTIONS: None   SUBJECTIVE:  SUBJECTIVE STATEMENT:  Patient reports her foot is still feeling tight  PAIN:  Are you having pain? Yes:  NPRS scale: 0/10, 7/10 with walking Pain location: Right foot / 2nd met Pain description: Stiff, sore Aggravating factors: Walking, standing Relieving factors: Sitting   OBJECTIVE: (objective measures completed at initial evaluation unless otherwise dated) PATIENT SURVEYS:  LEFS 27/80     SENSATION: Patient reports sensation deficits on lateral aspect of right foot   EDEMA:  Mild edema observed at dorsal aspect of 2nd MTP region   MUSCLE LENGTH: Calf flexibility deficit   PALPATION: Tender to palpation    LOWER EXTREMITY ROM:   Active ROM Right eval Left eval  Ankle dorsiflexion 0 7  Ankle plantarflexion 45 65  Ankle inversion 30 45  Ankle eversion 3 10    Patient demonstrates limited motion of 2nd MTP and IP joints   LOWER EXTREMITY MMT:   MMT Right eval Left eval  Ankle dorsiflexion 4 5  Ankle plantarflexion 2 5  Ankle inversion 4 5  Ankle eversion 4 5    FUNCTIONAL TESTS:  SLS: < 5 seconds on right   GAIT: Assistive device utilized: None Level of assistance: Complete Independence Comments: antalgic on right, patient ambulating in regular shoes     TODAY'S TREATMENT:       [facility] Adult PT  Treatment:                                                DATE: 09/28/2022 Therapeutic Exercise: Longsitting calf stretch with towel 2 x 30 sec Slant board calf stretch 3 x 30 sec 4-way banded ankle with red 2 x 15 Standing heel raises on Airex 2 x 15 Manual:  Right foot intertarsal mobs to improve foot mobility Right calf and plantar fascia STM Longsitting and prone knee bent    [facility] Adult PT Treatment:                                                DATE: 09/21/2022 Therapeutic Exercise: Longsitting calf stretch with towel 2 x 30 sec Slant board calf stretch 3 x 30 sec 4-way banded ankle with red 2 x 15 Standing heel raises on Airex 2 x 15 Manual:  Right foot intertarsal mobs to improve foot mobility Right calf and plantar fascia STM Longsitting and prone knee bent   [facility] Adult PT Treatment:                                                DATE: 09/15/2022 Therapeutic Exercise: Longsitting calf stretch with towel 2 x 30 sec Ankle PF, inversion, eversion with red x 10 each Seated figure-4 position toe stretching 5 x 5 sec Seated towel scrunch x 10 Seated heel - toe raises x 10   PATIENT EDUCATION:  Education details: HEP Person educated: Patient Education method: Consulting civil engineer, Demonstration, Corporate treasurer cues, Verbal cues Education comprehension: verbalized understanding, returned demonstration, verbal cues required, tactile cues required, and needs further education   HOME EXERCISE PROGRAM: Access Code: [email or web address]      ASSESSMENT: CLINICAL IMPRESSION: Patient tolerated therapy well  with no adverse effects. *** Patient would benefit from continued skilled PT to progress her mobility and strength in order to reduce pain and maximize functional ability.  Therapy focused on improve foot and ankle mobility, and progressing her ankle strength with good tolerance. She did arrive late so therapy limited on time. Incorporated manual to improve mobility and she did well with more weight bearing  stretching and strengthening exercises. She does demonstrate a slight improvement in ankle dorsiflexion this visit.     OBJECTIVE IMPAIRMENTS: Abnormal gait, decreased activity tolerance, decreased balance, decreased ROM, decreased strength, increased edema, impaired flexibility, and pain.    ACTIVITY LIMITATIONS: standing and locomotion level   PARTICIPATION LIMITATIONS: meal prep, cleaning, shopping, community activity, and occupation   PERSONAL FACTORS: Past/current experiences and Time since onset of injury/illness/exacerbation are also affecting patient's functional outcome.      GOALS: Goals reviewed with patient? Yes   SHORT TERM GOALS: Target date: 10/06/2022   Patient will be I with initial HEP in order to progress with therapy. Baseline: HEP provided at eval Goal status: INITIAL   2.  Patient will demonstrate proper gait mechanics with normal shoes in order to improve ambulation and community access Baseline: antalgic on right Goal status: INITIAL   3.  Patient will report right foot pain with walking </= 4/10 in order to reduce functional limitations and improve ability to perform household tasks Baseline: 7/10 pain with walking Goal status: INITIAL   LONG TERM GOALS: Target date: 10/27/2022   Patient will be I with final HEP to maintain progress from PT. Baseline: HEP provided at eval Goal status: INITIAL   2.  Patient will demonstrate right ankle DF AROM >/= 8 deg to improve walking ability and stair negotiation Baseline: right ankle DF AROM 0 deg Goal status: INITIAL   3.  Patient will demonstrate right ankle strength 5/5 MMT to improve tolerance for standing tasks and stability for balance and walking on unstable surfaces. Baseline: patient exhibits grosss strength deficit of right ankle noted above Goal status: INITIAL   4. Patient will report functional ability on LEFS >/= 55/80 in order to indicate improved ability to perform daily and household tasks             Baseline: 27/80            Goal Status: INITIAL     PLAN: PT FREQUENCY: 1x/week   PT DURATION: 6 weeks   PLANNED INTERVENTIONS: Therapeutic exercises, Therapeutic activity, Neuromuscular re-education, Balance training, Gait training, Patient/Family education, Self Care, Joint mobilization, Joint manipulation, Aquatic Therapy, Dry Needling, Manual therapy, and Re-evaluation   PLAN FOR NEXT SESSION: Review HEP and progress PRN, manual/stretching for right ankle/foot/toes, progress ankle strengthening, gait training, progress to closed chain ankle/foot strengthening and balance training as tolerated   Hilda Blades, PT, DPT, LAT, ATC 09/27/22  3:05 PM Phone: [phone] Fax: [phone]

## 2022-09-28 ENCOUNTER — Ambulatory Visit: Payer: Medicaid Other | Admitting: Physical Therapy

## 2022-09-28 ENCOUNTER — Ambulatory Visit: Payer: Medicaid Other

## 2022-09-30 ENCOUNTER — Other Ambulatory Visit: Payer: Self-pay | Admitting: Podiatry

## 2022-09-30 ENCOUNTER — Other Ambulatory Visit: Payer: Self-pay | Admitting: Family Medicine

## 2022-09-30 DIAGNOSIS — G6289 Other specified polyneuropathies: Secondary | ICD-10-CM

## 2022-09-30 MED ORDER — OXYCODONE-ACETAMINOPHEN 5-325 MG PO TABS: 1.0000 | ORAL_TABLET | Freq: Three times a day (TID) | ORAL | 0 refills | Status: DC | PRN

## 2022-10-04 NOTE — Therapy (Incomplete)
OUTPATIENT PHYSICAL THERAPY TREATMENT NOTE   Patient Name: [name] MRN: [id number] DOB:[birth year], 52 y.o., female Today's Date: 10/04/2022  PCP: Charlott Rakes, MD REFERRING PROVIDER: Trula Slade, [name]   END OF SESSION:     Past Medical History:  Diagnosis Date   Anxiety    Arthritis    right foot   Cirrhosis ([place])    GERD (gastroesophageal reflux disease)    History of alcoholism ([place])    History of cellulitis    2015  right side face   Hypertension    followed by pcp  (per pt never had stress test)   Malnutrition ([place])    Metatarsal fracture    right 2nd   Peripheral neuropathy    feet   Strachan's syndrome    painful neuropathy   Wears dentures    upper   Wears glasses    Past Surgical History:  Procedure Laterality Date   BREAST BIOPSY     MULTIPLE EXTRACTIONS WITH ALVEOLOPLASTY N/A 12/25/2013   Procedure: Extraction of tooth #'s 2,3,4,5,6,8,9,10,11,12,13,14,15,20,21,22,23,24,25,26,27, 32,02,33,43 with alveoloplasty and bilateral mandibular tori reductions;  Surgeon: Lenn Cal, DDS;  Location: [facility];  Service: Oral Surgery;  Laterality: N/A;   ORIF TOE FRACTURE Right 07/03/2019   Procedure: OPEN REDUCTION INTERNAL FIXATION (ORIF) METATARSAL (TOE) FRACTURE;  Surgeon: Trula Slade, [name];  Location: [facility];  Service: Podiatry;  Laterality: Right;  LEG BLOCK   SURAL NERVE BX Right 12/24/2013   Procedure: SURAL NERVE BIOPSY;  Surgeon: Kristeen Miss, MD;  Location: [facility];  Service: Neurosurgery;  Laterality: Right;   TUBAL LIGATION Bilateral 2002   PPTL   Patient Active Problem List   Diagnosis Date Noted   Alcohol abuse 02/16/2022   Hyperlipidemia 02/16/2022   Subcutaneous nodule of abdominal wall 10/21/2021   Cirrhosis ([place]) 10/21/2021   Tobacco abuse 03/19/2019   Vitamin D deficiency 10/25/2018   Closed fracture of bone of right foot 07/25/2018   Insomnia 10/02/2017   Ingrown toenail 04/05/2017   Closed  nondisplaced fracture of proximal phalanx of lesser toe of right foot 04/05/2017   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  Rationale for Evaluation and Treatment Rehabilitation  PERTINENT HISTORY: Multiple right foot / toe surgeries, bilateral LE and foot neuropathy   PRECAUTIONS: None   SUBJECTIVE:  SUBJECTIVE STATEMENT:  Patient reports her foot is still feeling tight  PAIN:  Are you having pain? Yes:  NPRS scale: 0/10, 7/10 with walking Pain location: Right foot / 2nd met Pain description: Stiff, sore Aggravating factors: Walking, standing Relieving factors: Sitting   OBJECTIVE: (objective measures completed at initial evaluation unless otherwise dated) PATIENT SURVEYS:  LEFS 27/80     SENSATION: Patient reports sensation deficits on lateral aspect of right foot   EDEMA:  Mild edema observed at dorsal aspect of 2nd MTP region   MUSCLE LENGTH: Calf flexibility deficit   PALPATION: Tender to palpation    LOWER EXTREMITY ROM:   Active ROM Right eval Left eval  Ankle dorsiflexion 0 7  Ankle plantarflexion 45 65  Ankle inversion 30 45  Ankle eversion 3 10    Patient demonstrates limited motion of 2nd MTP and IP joints   LOWER EXTREMITY MMT:   MMT Right eval Left eval  Ankle dorsiflexion 4 5  Ankle plantarflexion 2 5  Ankle inversion 4 5  Ankle eversion 4 5    FUNCTIONAL TESTS:  SLS: < 5 seconds on right   GAIT: Assistive device utilized: None Level of assistance: Complete Independence Comments: antalgic on right, patient ambulating in regular shoes     TODAY'S TREATMENT:       [facility] Adult PT  Treatment:                                                DATE: 10/05/2022 Therapeutic Exercise: Longsitting calf stretch with towel 2 x 30 sec Slant board calf stretch 3 x 30 sec 4-way banded ankle with red 2 x 15 Standing heel raises on Airex 2 x 15 Manual:  Right foot intertarsal mobs to improve foot mobility Right calf and plantar fascia STM Longsitting and prone knee bent    [facility] Adult PT Treatment:                                                DATE: 09/21/2022 Therapeutic Exercise: Longsitting calf stretch with towel 2 x 30 sec Slant board calf stretch 3 x 30 sec 4-way banded ankle with red 2 x 15 Standing heel raises on Airex 2 x 15 Manual:  Right foot intertarsal mobs to improve foot mobility Right calf and plantar fascia STM Longsitting and prone knee bent   [facility] Adult PT Treatment:                                                DATE: 09/15/2022 Therapeutic Exercise: Longsitting calf stretch with towel 2 x 30 sec Ankle PF, inversion, eversion with red x 10 each Seated figure-4 position toe stretching 5 x 5 sec Seated towel scrunch x 10 Seated heel - toe raises x 10   PATIENT EDUCATION:  Education details: HEP Person educated: Patient Education method: Consulting civil engineer, Demonstration, Corporate treasurer cues, Verbal cues Education comprehension: verbalized understanding, returned demonstration, verbal cues required, tactile cues required, and needs further education   HOME EXERCISE PROGRAM: Access Code: [email or web address]      ASSESSMENT: CLINICAL IMPRESSION: Patient tolerated therapy well  with no adverse effects. *** Patient would benefit from continued skilled PT to progress her mobility and strength in order to reduce pain and maximize functional ability.  Therapy focused on improve foot and ankle mobility, and progressing her ankle strength with good tolerance. She did arrive late so therapy limited on time. Incorporated manual to improve mobility and she did well with more weight bearing  stretching and strengthening exercises. She does demonstrate a slight improvement in ankle dorsiflexion this visit.     OBJECTIVE IMPAIRMENTS: Abnormal gait, decreased activity tolerance, decreased balance, decreased ROM, decreased strength, increased edema, impaired flexibility, and pain.    ACTIVITY LIMITATIONS: standing and locomotion level   PARTICIPATION LIMITATIONS: meal prep, cleaning, shopping, community activity, and occupation   PERSONAL FACTORS: Past/current experiences and Time since onset of injury/illness/exacerbation are also affecting patient's functional outcome.      GOALS: Goals reviewed with patient? Yes   SHORT TERM GOALS: Target date: 10/06/2022   Patient will be I with initial HEP in order to progress with therapy. Baseline: HEP provided at eval 10/05/2022: Goal status: INITIAL   2.  Patient will demonstrate proper gait mechanics with normal shoes in order to improve ambulation and community access Baseline: antalgic on right 10/05/2022: Goal status: INITIAL   3.  Patient will report right foot pain with walking </= 4/10 in order to reduce functional limitations and improve ability to perform household tasks Baseline: 7/10 pain with walking 10/05/2022: Goal status: INITIAL   LONG TERM GOALS: Target date: 10/27/2022   Patient will be I with final HEP to maintain progress from PT. Baseline: HEP provided at eval Goal status: INITIAL   2.  Patient will demonstrate right ankle DF AROM >/= 8 deg to improve walking ability and stair negotiation Baseline: right ankle DF AROM 0 deg Goal status: INITIAL   3.  Patient will demonstrate right ankle strength 5/5 MMT to improve tolerance for standing tasks and stability for balance and walking on unstable surfaces. Baseline: patient exhibits grosss strength deficit of right ankle noted above Goal status: INITIAL   4. Patient will report functional ability on LEFS >/= 55/80 in order to indicate improved ability to  perform daily and household tasks            Baseline: 27/80            Goal Status: INITIAL     PLAN: PT FREQUENCY: 1x/week   PT DURATION: 6 weeks   PLANNED INTERVENTIONS: Therapeutic exercises, Therapeutic activity, Neuromuscular re-education, Balance training, Gait training, Patient/Family education, Self Care, Joint mobilization, Joint manipulation, Aquatic Therapy, Dry Needling, Manual therapy, and Re-evaluation   PLAN FOR NEXT SESSION: Review HEP and progress PRN, manual/stretching for right ankle/foot/toes, progress ankle strengthening, gait training, progress to closed chain ankle/foot strengthening and balance training as tolerated   Hilda Blades, PT, DPT, LAT, ATC 10/04/22  3:48 PM Phone: [phone] Fax: [phone]

## 2022-10-05 ENCOUNTER — Ambulatory Visit: Payer: Medicaid Other | Admitting: Physical Therapy

## 2022-10-06 ENCOUNTER — Other Ambulatory Visit: Payer: Self-pay | Admitting: Podiatry

## 2022-10-06 MED ORDER — OXYCODONE-ACETAMINOPHEN 5-325 MG PO TABS: 1.0000 | ORAL_TABLET | Freq: Three times a day (TID) | ORAL | 0 refills | Status: DC | PRN

## 2022-10-12 ENCOUNTER — Ambulatory Visit: Payer: Medicaid Other | Admitting: Physical Therapy

## 2022-10-12 ENCOUNTER — Telehealth: Payer: Self-pay | Admitting: Physical Therapy

## 2022-10-12 NOTE — Therapy (Incomplete)
OUTPATIENT PHYSICAL THERAPY TREATMENT NOTE   Patient Name: [name] MRN: [id number] DOB:Feb 28, 1971, 52 y.o., female Today's Date: 10/12/2022  PCP: Charlott Rakes, MD REFERRING PROVIDER: Trula Slade, [name]   END OF SESSION:     Past Medical History:  Diagnosis Date   Anxiety    Arthritis    right foot   Cirrhosis ([place])    GERD (gastroesophageal reflux disease)    History of alcoholism ([place])    History of cellulitis    2015  right side face   Hypertension    followed by pcp  (per pt never had stress test)   Malnutrition ([place])    Metatarsal fracture    right 2nd   Peripheral neuropathy    feet   Strachan's syndrome    painful neuropathy   Wears dentures    upper   Wears glasses    Past Surgical History:  Procedure Laterality Date   BREAST BIOPSY     MULTIPLE EXTRACTIONS WITH ALVEOLOPLASTY N/A 12/25/2013   Procedure: Extraction of tooth #'s 2,3,4,5,6,8,9,10,11,12,13,14,15,20,21,22,23,24,25,26,27, 30,09,23,30 with alveoloplasty and bilateral mandibular tori reductions;  Surgeon: Lenn Cal, DDS;  Location: [facility];  Service: Oral Surgery;  Laterality: N/A;   ORIF TOE FRACTURE Right 07/03/2019   Procedure: OPEN REDUCTION INTERNAL FIXATION (ORIF) METATARSAL (TOE) FRACTURE;  Surgeon: Trula Slade, [name];  Location: [facility];  Service: Podiatry;  Laterality: Right;  LEG BLOCK   SURAL NERVE BX Right 12/24/2013   Procedure: SURAL NERVE BIOPSY;  Surgeon: Kristeen Miss, MD;  Location: [facility];  Service: Neurosurgery;  Laterality: Right;   TUBAL LIGATION Bilateral 2002   PPTL   Patient Active Problem List   Diagnosis Date Noted   Alcohol abuse 02/16/2022   Hyperlipidemia 02/16/2022   Subcutaneous nodule of abdominal wall 10/21/2021   Cirrhosis ([place]) 10/21/2021   Tobacco abuse 03/19/2019   Vitamin D deficiency 10/25/2018   Closed fracture of bone of right foot 07/25/2018   Insomnia 10/02/2017   Ingrown toenail 04/05/2017   Closed  nondisplaced fracture of proximal phalanx of lesser toe of right foot 04/05/2017   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  Rationale for Evaluation and Treatment Rehabilitation  PERTINENT HISTORY: Multiple right foot / toe surgeries, bilateral LE and foot neuropathy   PRECAUTIONS: None   SUBJECTIVE:  SUBJECTIVE STATEMENT:  Patient reports her foot is still feeling tight  PAIN:  Are you having pain? Yes:  NPRS scale: 0/10, 7/10 with walking Pain location: Right foot / 2nd met Pain description: Stiff, sore Aggravating factors: Walking, standing Relieving factors: Sitting   OBJECTIVE: (objective measures completed at initial evaluation unless otherwise dated) PATIENT SURVEYS:  LEFS 27/80     SENSATION: Patient reports sensation deficits on lateral aspect of right foot   EDEMA:  Mild edema observed at dorsal aspect of 2nd MTP region   MUSCLE LENGTH: Calf flexibility deficit   PALPATION: Tender to palpation    LOWER EXTREMITY ROM:   Active ROM Right eval Left eval  Ankle dorsiflexion 0 7  Ankle plantarflexion 45 65  Ankle inversion 30 45  Ankle eversion 3 10    Patient demonstrates limited motion of 2nd MTP and IP joints   LOWER EXTREMITY MMT:   MMT Right eval Left eval  Ankle dorsiflexion 4 5  Ankle plantarflexion 2 5  Ankle inversion 4 5  Ankle eversion 4 5    FUNCTIONAL TESTS:  SLS: < 5 seconds on right   GAIT: Assistive device utilized: None Level of assistance: Complete Independence Comments: antalgic on right, patient ambulating in regular shoes     TODAY'S TREATMENT:       [facility] Adult PT  Treatment:                                                DATE: 10/12/2022 Therapeutic Exercise: Longsitting calf stretch with towel 2 x 30 sec Slant board calf stretch 3 x 30 sec 4-way banded ankle with red 2 x 15 Standing heel raises on Airex 2 x 15 Manual:  Right foot intertarsal mobs to improve foot mobility Right calf and plantar fascia STM Longsitting and prone knee bent    [facility] Adult PT Treatment:                                                DATE: 09/21/2022 Therapeutic Exercise: Longsitting calf stretch with towel 2 x 30 sec Slant board calf stretch 3 x 30 sec 4-way banded ankle with red 2 x 15 Standing heel raises on Airex 2 x 15 Manual:  Right foot intertarsal mobs to improve foot mobility Right calf and plantar fascia STM Longsitting and prone knee bent   [facility] Adult PT Treatment:                                                DATE: 09/15/2022 Therapeutic Exercise: Longsitting calf stretch with towel 2 x 30 sec Ankle PF, inversion, eversion with red x 10 each Seated figure-4 position toe stretching 5 x 5 sec Seated towel scrunch x 10 Seated heel - toe raises x 10   PATIENT EDUCATION:  Education details: HEP Person educated: Patient Education method: Consulting civil engineer, Demonstration, Corporate treasurer cues, Verbal cues Education comprehension: verbalized understanding, returned demonstration, verbal cues required, tactile cues required, and needs further education   HOME EXERCISE PROGRAM: Access Code: [email or web address]      ASSESSMENT: CLINICAL IMPRESSION: Patient tolerated therapy well  with no adverse effects. *** Patient would benefit from continued skilled PT to progress her mobility and strength in order to reduce pain and maximize functional ability.  Therapy focused on improve foot and ankle mobility, and progressing her ankle strength with good tolerance. She did arrive late so therapy limited on time. Incorporated manual to improve mobility and she did well with more weight bearing  stretching and strengthening exercises. She does demonstrate a slight improvement in ankle dorsiflexion this visit.     OBJECTIVE IMPAIRMENTS: Abnormal gait, decreased activity tolerance, decreased balance, decreased ROM, decreased strength, increased edema, impaired flexibility, and pain.    ACTIVITY LIMITATIONS: standing and locomotion level   PARTICIPATION LIMITATIONS: meal prep, cleaning, shopping, community activity, and occupation   PERSONAL FACTORS: Past/current experiences and Time since onset of injury/illness/exacerbation are also affecting patient's functional outcome.      GOALS: Goals reviewed with patient? Yes   SHORT TERM GOALS: Target date: 10/06/2022   Patient will be I with initial HEP in order to progress with therapy. Baseline: HEP provided at eval 10/05/2022: Goal status: INITIAL   2.  Patient will demonstrate proper gait mechanics with normal shoes in order to improve ambulation and community access Baseline: antalgic on right 10/05/2022: Goal status: INITIAL   3.  Patient will report right foot pain with walking </= 4/10 in order to reduce functional limitations and improve ability to perform household tasks Baseline: 7/10 pain with walking 10/05/2022: Goal status: INITIAL   LONG TERM GOALS: Target date: 10/27/2022   Patient will be I with final HEP to maintain progress from PT. Baseline: HEP provided at eval Goal status: INITIAL   2.  Patient will demonstrate right ankle DF AROM >/= 8 deg to improve walking ability and stair negotiation Baseline: right ankle DF AROM 0 deg Goal status: INITIAL   3.  Patient will demonstrate right ankle strength 5/5 MMT to improve tolerance for standing tasks and stability for balance and walking on unstable surfaces. Baseline: patient exhibits grosss strength deficit of right ankle noted above Goal status: INITIAL   4. Patient will report functional ability on LEFS >/= 55/80 in order to indicate improved ability to  perform daily and household tasks            Baseline: 27/80            Goal Status: INITIAL     PLAN: PT FREQUENCY: 1x/week   PT DURATION: 6 weeks   PLANNED INTERVENTIONS: Therapeutic exercises, Therapeutic activity, Neuromuscular re-education, Balance training, Gait training, Patient/Family education, Self Care, Joint mobilization, Joint manipulation, Aquatic Therapy, Dry Needling, Manual therapy, and Re-evaluation   PLAN FOR NEXT SESSION: Review HEP and progress PRN, manual/stretching for right ankle/foot/toes, progress ankle strengthening, gait training, progress to closed chain ankle/foot strengthening and balance training as tolerated   Hilda Blades, PT, DPT, LAT, ATC 10/12/22  1:03 PM Phone: [phone] Fax: [phone]

## 2022-10-12 NOTE — Telephone Encounter (Signed)
Left VM informing patient of 2nd missed PT appointment (10/05/22 and 10/12/22), reminder of attendance policy and she would only be able to schedule 1 visit at a time. Left call back number for clinic.  Hilda Blades, PT, DPT, LAT, ATC 10/12/22  4:20 PM Phone: [phone] Fax: [phone]

## 2022-10-13 ENCOUNTER — Telehealth: Payer: Self-pay | Admitting: Anesthesiology

## 2022-10-13 ENCOUNTER — Encounter: Payer: Self-pay | Admitting: Neurology

## 2022-10-13 ENCOUNTER — Telehealth: Payer: Self-pay

## 2022-10-13 MED ORDER — GABAPENTIN 300 MG PO CAPS: 300.0000 mg | ORAL_CAPSULE | Freq: Three times a day (TID) | ORAL | 3 refills | Status: DC

## 2022-10-13 NOTE — Telephone Encounter (Signed)
ERROR

## 2022-10-13 NOTE — Telephone Encounter (Signed)
Patient came in to the office with a form that states exactly what the message below requests. The Rehab needs [place] to fax documentation that states Lyrica has been discontinued and will be sending in Gabapentin to [phone]. Gabapentin needs to be faxed to [place]. Did have Mahina speak with patient to go over Dr.Patel's message below.

## 2022-10-13 NOTE — Telephone Encounter (Signed)
Pt left message with AN stating she is checking into a rehab facility. She needs her Lyrica Rx changed to Gabapentin because she can not have narcotics or controlled substances in rehab. The change needs to be sent to [phone] at [facility] and documentation of the change needs to be sent to the rehab facility at [phone]. Pt would like to be admitted today requests a call back as soon as possible.

## 2022-10-13 NOTE — Telephone Encounter (Signed)
Noted.  We will discontinue Lyrica and start gabapentin '300mg'$  three times daily. Please ask her to contact us with update in 1 week to determine further increase in the dose. I wish her the best at rehab.

## 2022-10-13 NOTE — Telephone Encounter (Signed)
Spoke to patient and her mother informed her of Dr. Serita Grit message to discontinue her Lyrica and start her Gabapentin 300 mg daily. Patient aware she will need to give [place] a call in 1 week to determine if her Gabapentin needs to be increased. Printed out phone note of discontinuation and have faxed to [facility] at [phone]. Also Gabapentin refill has been sent to [facility] in [place].

## 2022-10-14 ENCOUNTER — Other Ambulatory Visit: Payer: Self-pay | Admitting: Family Medicine

## 2022-10-14 ENCOUNTER — Encounter: Payer: Self-pay | Admitting: Family Medicine

## 2022-10-14 DIAGNOSIS — F32A Depression, unspecified: Secondary | ICD-10-CM

## 2022-10-14 DIAGNOSIS — G6289 Other specified polyneuropathies: Secondary | ICD-10-CM

## 2022-10-14 MED ORDER — BUSPIRONE HCL 5 MG PO TABS: 5.0000 mg | ORAL_TABLET | Freq: Two times a day (BID) | ORAL | 0 refills | Status: DC

## 2022-10-14 MED ORDER — DULOXETINE HCL 60 MG PO CPEP: 60.0000 mg | ORAL_CAPSULE | Freq: Two times a day (BID) | ORAL | 0 refills | Status: DC

## 2022-10-14 NOTE — Telephone Encounter (Signed)
Requested Prescriptions  Pending Prescriptions Disp Refills   busPIRone (BUSPAR) 5 MG tablet 180 tablet 1    Sig: Take 1 tablet (5 mg total) by mouth 2 (two) times daily.     Psychiatry: Anxiolytics/Hypnotics - Non-controlled Passed - 10/14/2022 10:11 AM      Passed - Valid encounter within last 12 months    Recent Outpatient Visits           8 months ago Hypokalemia   [place], Enobong, MD   1 year ago Left upper quadrant abdominal mass   [place] Charlott Rakes, MD   1 year ago Annual physical exam   [place] Charlott Rakes, MD   2 years ago Essential hypertension   [place], Enobong, MD   2 years ago Suspected COVID-19 virus infection   [place] Charlott Rakes, MD

## 2022-10-14 NOTE — Telephone Encounter (Signed)
The patient and her mother called, they said the rehab place has not received the information they need. [name] isn't able to go to detox b/c they have not received it. [name] would like a call back ASAP. [phone]

## 2022-10-14 NOTE — Telephone Encounter (Signed)
Medication Refill - Medication: busPIRone (BUSPAR) 5 MG tablet [[id number]]   [facility] is a [facility]. Needing medication   Has the patient contacted their pharmacy? Yes.   (Agent: If no, request that the patient contact the pharmacy for the refill. If patient does not wish to contact the pharmacy document the reason why and proceed with request.) (Agent: If yes, when and what did the pharmacy advise?)  Preferred Pharmacy (with phone number or street name): [address] [place], [place] [address] [phone] Has the patient been seen for an appointment in the last year OR does the patient have an upcoming appointment? Yes.    Agent: Please be advised that RX refills may take up to 3 business days. We ask that you follow-up with your pharmacy.

## 2022-10-14 NOTE — Telephone Encounter (Signed)
I have re-faxed the paperwork again. Total 10 times and have gotten successful fax notices.

## 2022-10-16 ENCOUNTER — Encounter: Payer: Self-pay | Admitting: Neurology

## 2022-10-17 ENCOUNTER — Telehealth (HOSPITAL_COMMUNITY): Payer: Self-pay | Admitting: Licensed Clinical Social Worker

## 2022-10-17 ENCOUNTER — Telehealth: Payer: Self-pay | Admitting: Neurology

## 2022-10-17 MED ORDER — GABAPENTIN 300 MG PO CAPS: 600.0000 mg | ORAL_CAPSULE | Freq: Three times a day (TID) | ORAL | 3 refills | Status: DC

## 2022-10-17 NOTE — Telephone Encounter (Signed)
Patient mother called and states that they sent a message by mychart this weekend about adjustment on medication so patient can get back to rehab she wanted to make sure that we got this so the patient could get back to rehab

## 2022-10-17 NOTE — Telephone Encounter (Signed)
The therapist returns a call from [name] mother with the therapist asking to speak with [name] confirming her identity via two identifiers. She was in [facility] for detox from alcohol, marijuana, and percocet saying that she last used alcohol on 10/09/22 and last used marijuana on 10/10/22.  She wants residential treatment and was in [facility] saying that a staff person okayed her going to see her Neurologist for an outpatient visit after which she could resume treatment; however, she says that when she returned that she was told she could not be readmitted for 30 days. They have called [facility] and were given a list of residential treatment facilities but have had no luck saying some say she cannot be on medications at all and others say she cannot take her Gabapentin for diabetic neuropathy.   The therapist recommends that [name] ask to speak to the Clinical Director at [facility] regarding what happened and to call [facility] to ask for a Care Manager to assist her in getting connected to needed services.   Her mom asks if [name] can get outpatient treatment if she chooses that option. The therapist explains that [facility] could potentially attend the [facility] IOP but would need to come in for an assessment first.  They will pursue the aforementioned options and call this therapist back p.r.n.   Adam Phenix, [place], LCSW, [facility], [place] 10/17/2022

## 2022-10-17 NOTE — Telephone Encounter (Signed)
The patient message has been sent to Dr. Posey Pronto for review.

## 2022-10-20 ENCOUNTER — Telehealth (HOSPITAL_COMMUNITY): Payer: Self-pay | Admitting: Licensed Clinical Social Worker

## 2022-10-20 ENCOUNTER — Ambulatory Visit (INDEPENDENT_AMBULATORY_CARE_PROVIDER_SITE_OTHER): Payer: Medicaid Other | Admitting: Licensed Clinical Social Worker

## 2022-10-20 DIAGNOSIS — F1190 Opioid use, unspecified, uncomplicated: Secondary | ICD-10-CM

## 2022-10-20 DIAGNOSIS — F39 Unspecified mood [affective] disorder: Secondary | ICD-10-CM

## 2022-10-20 DIAGNOSIS — F17200 Nicotine dependence, unspecified, uncomplicated: Secondary | ICD-10-CM

## 2022-10-20 DIAGNOSIS — Z720 Tobacco use: Secondary | ICD-10-CM

## 2022-10-20 DIAGNOSIS — F1090 Alcohol use, unspecified, uncomplicated: Secondary | ICD-10-CM | POA: Diagnosis not present

## 2022-10-20 DIAGNOSIS — F1120 Opioid dependence, uncomplicated: Secondary | ICD-10-CM

## 2022-10-20 DIAGNOSIS — F1290 Cannabis use, unspecified, uncomplicated: Secondary | ICD-10-CM | POA: Diagnosis not present

## 2022-10-20 DIAGNOSIS — F1020 Alcohol dependence, uncomplicated: Secondary | ICD-10-CM

## 2022-10-20 DIAGNOSIS — F1220 Cannabis dependence, uncomplicated: Secondary | ICD-10-CM

## 2022-10-20 NOTE — Telephone Encounter (Signed)
The therapist returns [name]'s mother's call and asks for her to put [name] on the phone verifying her identity via two identifiers. At one point, the therapist asks [name] if she is attending meetings with her mother answering the question as she is apparently still on the call.   [name] says that the Case Manager with [employer] could not get her back in [place] so [name] is wanting to attend [facility] IOP. As they will be in [place] today, [name] asks if she can be seen for an assessment today with the therapist scheduling her for today at 11 a.m.   Adam Phenix, [place], LCSW, [facility], [place] 10/20/2022
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
## 2022-10-21 ENCOUNTER — Ambulatory Visit (INDEPENDENT_AMBULATORY_CARE_PROVIDER_SITE_OTHER): Payer: Medicaid Other | Admitting: Licensed Clinical Social Worker

## 2022-10-21 ENCOUNTER — Encounter: Payer: Medicaid Other | Admitting: Podiatry

## 2022-10-21 DIAGNOSIS — F1120 Opioid dependence, uncomplicated: Secondary | ICD-10-CM

## 2022-10-21 DIAGNOSIS — F1220 Cannabis dependence, uncomplicated: Secondary | ICD-10-CM

## 2022-10-21 DIAGNOSIS — F1020 Alcohol dependence, uncomplicated: Secondary | ICD-10-CM | POA: Diagnosis not present

## 2022-10-21 DIAGNOSIS — F17200 Nicotine dependence, unspecified, uncomplicated: Secondary | ICD-10-CM

## 2022-10-21 DIAGNOSIS — F39 Unspecified mood [affective] disorder: Secondary | ICD-10-CM

## 2022-10-21 NOTE — Progress Notes (Signed)
Daily Group Progress Note   Program: CD IOP     Individual Time: 9 a.m. to 12 p.m.    Type of Therapy: Process and Psychoeducational    Topic: The therapist checks in with group members, assesses for SI/HI/psychosis and overall level of functioning. The therapist inquires about sobriety date and number of community support meetings attended since last session.    The therapist introduces a new group member and, again, has existing members share their stories of how they came to be in [facility]. The therapist facilitates discussion on what a home group  is, what is meant by a burning desire, why Sponsors have to the the same sex as Sponsees, what is meant by the phrase, "13th stepping," and what is meant by the "pink cloud." The therapist all talks about various phrases that are forms of denial such as "it's all natural." The therapist continues to explain the reason that addiction is an illness and not a character defect and normalizes people being nervous when they first come to treatment noting that many people with addiction do not initially like or trust people and many have issues with social anxiety that using substances alleviates.    Summary: [name] presents rating her depression as a "3" and anxiety as an "10."    She asks early in group what the "pink cloud" is to which other members are referring. She was unable to attend the meeting that she was planning on attending yesterday but writes down the [employer] website and gets numbers from other women in group saying that she plans on attending meetings this weekend.  She says that her mood is "good" and "fearful" noting that she is afraid of the unknown and admits that she has anxiety about speaking in front of people which she did not have when using. She says that she is surprised that she talked as much as she did.   [name] asks to speak with the therapist at break noting that she is anxious about returning home today due to the boyfriend  situation and asking if she can see the PA today for anti-craving medications. The therapist informs her that he will message the PA but that she may not see him until Monday recommending that she attend meetings over the weekend and see if she can stay at her mom's a little longer if needed if she fears returning home will lead to relapse.   Progress Towards Goals: [name] reports no change in her sobriety date   UDS collected: No Results: No   AA/NA attended?: No   Sponsor?: No   Adam Phenix, MA, LCSW, [facility], LCAS 10/21/2022

## 2022-10-24 ENCOUNTER — Encounter (HOSPITAL_COMMUNITY): Payer: Self-pay

## 2022-10-24 ENCOUNTER — Encounter (HOSPITAL_COMMUNITY): Payer: Self-pay | Admitting: Medical

## 2022-10-24 ENCOUNTER — Telehealth (HOSPITAL_COMMUNITY): Payer: Self-pay | Admitting: Licensed Clinical Social Worker

## 2022-10-24 ENCOUNTER — Ambulatory Visit (HOSPITAL_COMMUNITY): Payer: Medicaid Other | Admitting: Medical

## 2022-10-24 NOTE — Progress Notes (Signed)
Patient ID: [name], female   DOB: 1971-03-03, 52 y.o.   MRN: [id number] Pt scheduled for Initial Asessment. Due to death in family (father0 SHE WILL NOT BE PRESENTTODAY

## 2022-10-24 NOTE — Telephone Encounter (Signed)
The therapist receives a call from [name] saying that she will not be in group today as her father passed away. She says that her mom is picking her up this morning to drive her to where her father lived, and [name] offers to stop by the [facility] on the way to take a UDS if needed with the therapist informing her that this will not be necessary.   Adam Phenix, [place], LCSW, [facility], [place] 10/24/2022

## 2022-10-26 ENCOUNTER — Ambulatory Visit (INDEPENDENT_AMBULATORY_CARE_PROVIDER_SITE_OTHER): Payer: Medicaid Other | Admitting: Licensed Clinical Social Worker

## 2022-10-26 VITALS — BP 124/70 | HR 88 | Ht 64.0 in | Wt 126.0 lb

## 2022-10-26 DIAGNOSIS — Z8679 Personal history of other diseases of the circulatory system: Secondary | ICD-10-CM

## 2022-10-26 DIAGNOSIS — F4321 Adjustment disorder with depressed mood: Secondary | ICD-10-CM

## 2022-10-26 DIAGNOSIS — K7030 Alcoholic cirrhosis of liver without ascites: Secondary | ICD-10-CM

## 2022-10-26 DIAGNOSIS — F1020 Alcohol dependence, uncomplicated: Secondary | ICD-10-CM | POA: Diagnosis not present

## 2022-10-26 DIAGNOSIS — F39 Unspecified mood [affective] disorder: Secondary | ICD-10-CM

## 2022-10-26 DIAGNOSIS — F17200 Nicotine dependence, unspecified, uncomplicated: Secondary | ICD-10-CM

## 2022-10-26 DIAGNOSIS — T7491XS Unspecified adult maltreatment, confirmed, sequela: Secondary | ICD-10-CM

## 2022-10-26 DIAGNOSIS — G621 Alcoholic polyneuropathy: Secondary | ICD-10-CM | POA: Diagnosis not present

## 2022-10-26 DIAGNOSIS — F1120 Opioid dependence, uncomplicated: Secondary | ICD-10-CM

## 2022-10-26 DIAGNOSIS — F1220 Cannabis dependence, uncomplicated: Secondary | ICD-10-CM

## 2022-10-26 DIAGNOSIS — T7432XS Child psychological abuse, confirmed, sequela: Secondary | ICD-10-CM

## 2022-10-26 DIAGNOSIS — F4312 Post-traumatic stress disorder, chronic: Secondary | ICD-10-CM

## 2022-10-26 DIAGNOSIS — G894 Chronic pain syndrome: Secondary | ICD-10-CM
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
## 2022-10-28 ENCOUNTER — Ambulatory Visit (INDEPENDENT_AMBULATORY_CARE_PROVIDER_SITE_OTHER): Payer: Medicaid Other | Admitting: Licensed Clinical Social Worker

## 2022-10-28 DIAGNOSIS — F1020 Alcohol dependence, uncomplicated: Secondary | ICD-10-CM | POA: Diagnosis not present

## 2022-10-28 DIAGNOSIS — F1120 Opioid dependence, uncomplicated: Secondary | ICD-10-CM

## 2022-10-28 DIAGNOSIS — F17200 Nicotine dependence, unspecified, uncomplicated: Secondary | ICD-10-CM

## 2022-10-28 DIAGNOSIS — F1220 Cannabis dependence, uncomplicated: Secondary | ICD-10-CM

## 2022-10-28 DIAGNOSIS — F39 Unspecified mood [affective] disorder: Secondary | ICD-10-CM

## 2022-10-28 NOTE — Progress Notes (Signed)
Daily Group Progress Note   Program: CD IOP     Individual Time: 9:25 a.m. to 12 p.m.    Type of Therapy: Process and Psychoeducational    Topic: The therapist checks in with group members, assesses for SI/HI/psychosis and overall level of functioning. The therapist inquires about sobriety date and number of community support meetings attended since last session.    The therapist has group members finish watching the video, "Breaking the Addiction Cycle" challenging them to come up with ways to interrupt their own using rituals when in a trance-like state. The therapist also emphasizes the two biggest keys to recovery illustrated in the video while answering questions about process addictions  The therapist explains what is meant by the program being one of "attraction" but not "promotion." He shares the NA Just for Today reading for the day which focuses on the fact that bad things will still happen to people when they are in recovery but that they will find that they can learn to sit with uncomfortable emotions.    Summary: [name] presents rating her depression as an "8" and anxiety as an "9."    She says that her mood is "depressed" and "proud." She also reports feeling "distant" and "frustrated." [name] is tearful in talking about her father's funeral and how her half-sisters had pictures of her father will all his kids flashing on a screen with the exception of [name]. She admits that this caused her to think about using but is proud that she did not drink. She concludes that she will no longer likely have contact with these sisters having the support of her brother, her mother, and her daughter.   The therapist observes that [name] has a tendency to ask if she should feel a certain way with the therapist noting that there is no need to ask as how she feels is how she feels and there is no right or wrong way to feel. She has not attended a meeting but says that she now she can concentrate she will  start attending meetings and gets help from another member at the end of group to find a meeting locator app and this member recommends a good women's meeting for her.   Progress Towards Goals: [name] reports no change in her sobriety date   UDS collected: No Results: No   AA/NA attended?: No   Sponsor?: No   Adam Phenix, MA, LCSW, [facility], LCAS 10/28/2022

## 2022-10-31 ENCOUNTER — Ambulatory Visit (INDEPENDENT_AMBULATORY_CARE_PROVIDER_SITE_OTHER): Payer: Medicaid Other | Admitting: Licensed Clinical Social Worker

## 2022-10-31 DIAGNOSIS — F1120 Opioid dependence, uncomplicated: Secondary | ICD-10-CM

## 2022-10-31 DIAGNOSIS — F39 Unspecified mood [affective] disorder: Secondary | ICD-10-CM

## 2022-10-31 DIAGNOSIS — F17200 Nicotine dependence, unspecified, uncomplicated: Secondary | ICD-10-CM

## 2022-10-31 DIAGNOSIS — F1020 Alcohol dependence, uncomplicated: Secondary | ICD-10-CM

## 2022-10-31 DIAGNOSIS — F1220 Cannabis dependence, uncomplicated: Secondary | ICD-10-CM

## 2022-10-31 NOTE — Progress Notes (Signed)
Daily Group Progress Note   Program: CD IOP     Individual Time: 9 a.m. to 12 p.m.    Type of Therapy: Process and Psychoeducational    Topic: The therapist checks in with group members, assesses for SI/HI/psychosis and overall level of functioning. The therapist inquires about sobriety date and number of community support meetings attended since last session.    The therapist addresses issues concerning how to deal with social anxiety in addition to facilitating a group discussion on what essentially is radical acceptance noting the saying, "suffering is inevitable but misery is optional."  The therapist has group members complete Matrix Model Recovery Checklist IC 2A.    Summary: [name] presents rating her depression as an "3" and anxiety as a "7."   She reports having 3 weeks of sobriety as of today and plans on going to a meeting on Wednesday which one of the guys in group told her about with another guy saying that he will attend to see her pick up  her chip. She says that she feels "happy" that she has gotten a lot of the "negative stuff" behind her and "proud." She also says that she is "content" with the way life is going. She plans on attending a meeting today and says that she has had no urges to drink.  At the same time, she admits that she continues to feel nervous when speaking in this group which the therapist normalizes. In addition to this being hard, she says that it was hard giving up all but two of her friends as they all drink. [name] admits that she just stopped contacting them wondering if she should tell them something with the therapist and group members providing feedback concerning what she might tell them to explain the reason she cannot hang out with them anymore while minimizing the likelihood of offending them.   Progress Towards Goals: [name] reports no change in her sobriety date   UDS collected: Yes Results: Yes, negative for drugs and alcohol   AA/NA attended?:  No   Sponsor?: No   Adam Phenix, MA, LCSW, [facility], LCAS 10/31/2022

## 2022-11-01 ENCOUNTER — Ambulatory Visit: Payer: Self-pay

## 2022-11-01 ENCOUNTER — Telehealth: Payer: Self-pay | Admitting: Family Medicine

## 2022-11-01 DIAGNOSIS — F32A Depression, unspecified: Secondary | ICD-10-CM

## 2022-11-01 NOTE — Telephone Encounter (Signed)
Pt wants to increase her anxiety medication, wants to know if she can take more as directed. Her father passed away and she is having more anxiety than normal. Buspirone.  Transferred to triage.

## 2022-11-01 NOTE — Telephone Encounter (Signed)
Patient seen [facility] MD on yesterday. Patient advised to seek guidance for medication increase from her [facility] MD.  Patient informed that until she has been seen  by PCP we can not  advise on medication adjustments. Patient has an appointment with A McClung on 11/17/2022. Patient voiced understanding of all discussed .

## 2022-11-01 NOTE — Telephone Encounter (Signed)
  Chief Complaint: increased anxiety- father died 2 weeks ago Symptoms: overwhelmed trouble concentrating Frequency: 2 weeks Pertinent Negatives: Patient denies suicidal ideation or plan Disposition: []$ ED /[]$ Urgent Care (no appt availability in office) / []$ Appointment(In office/virtual)/ []$  [facility]/ []$ Home Care/ []$ Refused Recommended Disposition /[]$ [facility]/ []$  Follow-up with PCP Additional Notes: unable to find appt within 3 days- Pt would like if her Buspar dosage can be increased to see if that will help decrease anxiety.  Reason for Disposition  MODERATE anxiety (e.g., persistent or frequent anxiety symptoms; interferes with sleep, school, or work)  Answer Assessment - Initial Assessment Questions 1. CONCERN: "Did anything happen that prompted you to call today?"      Father died 2 weeks ago and suffering from increased anxiety 2. ANXIETY SYMPTOMS: "Can you describe how you (your loved one; patient) have been feeling?" (e.g., tense, restless, panicky, anxious, keyed up, overwhelmed, sense of impending doom).      Anxious, overwhelmed 3. ONSET: "How long have you been feeling this way?" (e.g., hours, days, weeks)     2 weeks ago 4. SEVERITY: "How would you rate the level of anxiety?" (e.g., 0 - 10; or mild, moderate, severe).     Moderate and severe 5. FUNCTIONAL IMPAIRMENT: "How have these feelings affected your ability to do daily activities?" "Have you had more difficulty than usual doing your normal daily activities?" (e.g., getting better, same, worse; self-care, school, work, interactions)     Trouble concentration  6. HISTORY: "Have you felt this way before?" "Have you ever been diagnosed with an anxiety problem in the past?" (e.g., generalized anxiety disorder, panic attacks, PTSD). If Yes, ask: "How was this problem treated?" (e.g., medicines, counseling, etc.)     Yes not as severe- [place]ipat 7. RISK OF HARM - SUICIDAL IDEATION: "Do you ever have  thoughts of hurting or killing yourself?" If Yes, ask:  "Do you have these feelings now?" "Do you have a plan on how you would do this?"     no 8. TREATMENT:  "What has been done so far to treat this anxiety?" (e.g., medicines, relaxation strategies). "What has helped?"     Med  9. TREATMENT - THERAPIST: "Do you have a counselor or therapist? Name?"     yes  11. PATIENT SUPPORT: "Who is with you now?" "Who do you live with?" "Do you have family or friends who you can talk to?"        Lives with boyfriend 56. OTHER SYMPTOMS: "Do you have any other symptoms?" (e.g., feeling depressed, trouble concentrating, trouble sleeping, trouble breathing, palpitations or fast heartbeat, chest pain, sweating, nausea, or diarrhea)       Trouble concentrating 13. PREGNANCY: "Is there any chance you are pregnant?" "When was your last menstrual period?"       N/a  Protocols used: Anxiety and Panic Attack-A-AH

## 2022-11-01 NOTE — Telephone Encounter (Signed)
See nurse triage encounter, issue addressed.

## 2022-11-02 ENCOUNTER — Encounter (HOSPITAL_COMMUNITY): Payer: Self-pay | Admitting: Licensed Clinical Social Worker

## 2022-11-02 ENCOUNTER — Ambulatory Visit (INDEPENDENT_AMBULATORY_CARE_PROVIDER_SITE_OTHER): Payer: Medicaid Other | Admitting: Licensed Clinical Social Worker

## 2022-11-02 DIAGNOSIS — F1020 Alcohol dependence, uncomplicated: Secondary | ICD-10-CM | POA: Diagnosis not present

## 2022-11-02 DIAGNOSIS — F17200 Nicotine dependence, unspecified, uncomplicated: Secondary | ICD-10-CM

## 2022-11-02 DIAGNOSIS — F1120 Opioid dependence, uncomplicated: Secondary | ICD-10-CM

## 2022-11-02 DIAGNOSIS — F39 Unspecified mood [affective] disorder: Secondary | ICD-10-CM

## 2022-11-02 DIAGNOSIS — F1220 Cannabis dependence, uncomplicated: Secondary | ICD-10-CM

## 2022-11-02 MED ORDER — NALTREXONE HCL 50 MG PO TABS: 50.0000 mg | ORAL_TABLET | Freq: Every day | ORAL | 0 refills | Status: DC

## 2022-11-02 MED ORDER — BUSPIRONE HCL 10 MG PO TABS: 10.0000 mg | ORAL_TABLET | Freq: Two times a day (BID) | ORAL | 1 refills | Status: DC

## 2022-11-02 NOTE — Telephone Encounter (Signed)
I have sent a prescription for an increased dose of BuSpar 10 mg to her pharmacy.

## 2022-11-02 NOTE — Telephone Encounter (Signed)
Routing to PCP for review.

## 2022-11-03 NOTE — Progress Notes (Signed)
Daily Group Progress Note   Program: CD IOP     Individual Time: 9 a.m. to 12 p.m.    Type of Therapy: Process and Psychoeducational    Topic: The therapist checks in with group members, assesses for SI/HI/psychosis and overall level of functioning. The therapist inquires about sobriety date and number of community support meetings attended since last session.    The therapist educates group members about a new, potentially dangerous legal drug, Tianeptine encouraging them to avoid it if they encounter it. The therapist primarily focuses on what is meant by being an "ACOA" and on how to deal with dysfunctional family dynamics via limit setting while noting that people can grieve for the family that they did not have and may possibly never have.    Summary: Keyshawna presents rating her depression as an "3" and anxiety as a "8."   When another group member talks about his toxic relationship with his mother, Annica says that this is identical to the relationship that she had with her father. She also adds that she never saw her parents kiss or hug. She recalls her father telling Lakiska and her siblings that none of them would be here without him.  Colton says that she is "happy" with what she is doing and feels "powerful" over the situation and "relieved." She notes that tomorrow is her deceased father's birthday.  When the therapist questions when Naleia will attend a meeting, she says that she will attend on Friday; however, the therapist notes that Kamree previously said that she would attend one today. She then claims to be too busy with paperwork; however, when the therapist confronts her about this, she finally admits that she has not attended a meeting as she is scared. The therapist and other group members normalize being scared to attend one's first meeting and another group member suggests that Nissa do a virtual meeting first noting that she can keep her camera off and does not have to say anything. Shaqueria  says that she can do this so will attend a virtual meeting today.   Progress Towards Goals: Shelsy reports no change in her sobriety date   UDS collected: No Results: No   AA/NA attended?: No   Sponsor?: No   Adam Phenix, MA, LCSW, [facility], LCAS 11/02/2022

## 2022-11-03 NOTE — Telephone Encounter (Signed)
Patient has been informed of medication being sent

## 2022-11-03 NOTE — Telephone Encounter (Signed)
Patient was seen by [facility] and they increased her Buspar to 10 mg 3 times daily.

## 2022-11-04 ENCOUNTER — Encounter (HOSPITAL_COMMUNITY): Payer: Self-pay | Admitting: Medical

## 2022-11-04 ENCOUNTER — Other Ambulatory Visit (HOSPITAL_COMMUNITY): Payer: Self-pay | Admitting: Medical

## 2022-11-04 ENCOUNTER — Ambulatory Visit (INDEPENDENT_AMBULATORY_CARE_PROVIDER_SITE_OTHER): Payer: Medicaid Other | Admitting: Licensed Clinical Social Worker

## 2022-11-04 DIAGNOSIS — F4312 Post-traumatic stress disorder, chronic: Secondary | ICD-10-CM

## 2022-11-04 DIAGNOSIS — Z8679 Personal history of other diseases of the circulatory system: Secondary | ICD-10-CM

## 2022-11-04 DIAGNOSIS — K7030 Alcoholic cirrhosis of liver without ascites: Secondary | ICD-10-CM

## 2022-11-04 DIAGNOSIS — F39 Unspecified mood [affective] disorder: Secondary | ICD-10-CM

## 2022-11-04 DIAGNOSIS — G894 Chronic pain syndrome: Secondary | ICD-10-CM

## 2022-11-04 DIAGNOSIS — F1020 Alcohol dependence, uncomplicated: Secondary | ICD-10-CM | POA: Diagnosis not present

## 2022-11-04 DIAGNOSIS — F4321 Adjustment disorder with depressed mood: Secondary | ICD-10-CM

## 2022-11-04 DIAGNOSIS — F17200 Nicotine dependence, unspecified, uncomplicated: Secondary | ICD-10-CM

## 2022-11-04 DIAGNOSIS — F1220 Cannabis dependence, uncomplicated: Secondary | ICD-10-CM

## 2022-11-04 DIAGNOSIS — T7432XS Child psychological abuse, confirmed, sequela: Secondary | ICD-10-CM

## 2022-11-04 DIAGNOSIS — G621 Alcoholic polyneuropathy: Secondary | ICD-10-CM

## 2022-11-04 DIAGNOSIS — T7491XS Unspecified adult maltreatment, confirmed, sequela: Secondary | ICD-10-CM

## 2022-11-04 DIAGNOSIS — F1120 Opioid dependence, uncomplicated: Secondary | ICD-10-CM

## 2022-11-04 MED ORDER — DULOXETINE HCL 20 MG PO CPEP: ORAL_CAPSULE | ORAL | 0 refills | Status: DC
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
## 2022-11-04 NOTE — Progress Notes (Signed)
Daily Group Progress Note   Program: CD IOP     Individual Time: 9 a.m. to 12 p.m.    Type of Therapy: Process and Psychoeducational    Topic: The therapist checks in with group members, assesses for SI/HI/psychosis and overall level of functioning. The therapist inquires about sobriety date and number of community support meetings attended since last session.   The therapist facilitates a discussion on how to maintain healthy boundaries in interpersonal relationships and what is meant by getting "triangulated" while explaining how this connects to maintaining one's sobriety. The therapist shares the NA Just for Today Reading concerning putting principles before personality and the need to not "shoot the messenger." The therapist also focuses on the benefits of accountability specifically when the person to whom the person is being accountable is someone he or she knows cares.   Summary: [name] presents rating her depression as an "2" and anxiety as a "7."   She is attending an in person meeting today immediately after group saying that she is "nervous" but she is also "content" having spoken to her adult son who is in the [employer] and stationed in [place]. Two of the men in group are attending the same meeting as [name] and offer to serve as her "wingmen." [name] asks if one of them can give her a ride to the meeting with both offering to do so. [name] concludes that she feels better going with some people she knows with one of the men offering to help [name] get connected to a Medical sales representative. The therapist explains that [name] can pickup her first chip and that she does not have to say anything when she does so which causes her a sense of relief.  In talking about boundaries, [name] says that she does not like it when her boyfriend intervenes in situations involving other people on her behalf as she feels like in doing so that he is treating her like a child. She says that her boyfriend is getting home later  and later from work speculating that this is because he is getting off work and drinking before coming home to hide her drinking. She concludes that she wants out of the relationship and is working with her daughter to find options which is difficult as she is not working presently.   Progress Towards Goals: [name] reports no change in her sobriety date   UDS collected: No Results: No   AA/NA attended?: No   Sponsor?: No   Adam Phenix, MA, LCSW, [facility], LCAS 11/04/2022

## 2022-11-06 NOTE — Progress Notes (Signed)
Patient ID: [name], female   DOB: 03/21/1971, 52 y.o.   MRN: [id number] Tapering schedule Cymbalta ER Date    Actual daily dose 120 mg     Capsule, delayed release, '20mg'$      Target plasma concentration         Actual plasma concentration          Target reuptake inhibition       Actual reuptake inhibition   Target daily dose mg            Actual daily dose mg Fri, 04 Nov 2022         120.0   6          100.0 %           100.0 %           100.0 %           100.0 %        120.0   120.0 Sat, 05 Nov 2022        100.0   5          93.3 % 87.5 % 99.6 % 99.3 % 109.3   100.0 Sun, 06 Nov 2022       100.0   5          87.1 % 84.4 % 99.3 % 99.1 % 104.3   100.0 Mon, 07 Nov 2022      100.0   5          81.2 % 83.6 % 98.9 % 99.0 % 96.2     100.0 Tue, 08 Nov 2022       80.0     4          75.8 % 70.9 % 98.4 % 98.0 % 87.9     80.0 Wed, 09 Nov 2022      80.0     4          70.7 % 67.7 % 98.0 % 97.7 % 84.8     80.0 Thu, 10 Nov 2022       80.0     4          66.0 % 66.9 % 97.5 % 97.6 % 78.5     80.0 Fri, 11 Nov 2022         80.0     4          61.6 % 66.7 % 97.0 % 97.6 % 71.8     80.0 Sat, 12 Nov 2022        60.0     3          57.5 % 54.2 % 96.5 % 96.0 % 65.3     60.0 Sun, 13 Nov 2022       60.0     3          53.6 % 51.0 % 95.9 % 95.5 % 64.1     60.0 Mon, 14 Nov 2022      60.0     3          50.1 % 50.3 % 95.3 % 95.3 % 59.7     60.0 Tue, 15 Nov 2022       60.0     3          46.7 % 50.1 % 94.6 % 95.3 % 54.6     60.0 Wed, 16 Nov 2022  40.0     2          43.6 % 37.5 % 94.0 % 92.4 % 49.7     40.0 Thu, 17 Nov 2022       60.0     3          40.7 % 46.9 % 93.2 % 94.7 % 50.1     60.0 Fri, 18 Nov 2022         40.0     2          37.9 % 36.7 % 92.5 % 92.1 % 42.0     40.0 Sat, 19 Nov 2022        40.0     2          35.4 % 34.2 % 91.7 % 91.3 % 42.0     40.0 Sun, 20 Nov 2022       40.0     2          33.0 % 33.5 % 90.9 % 91.0 % 39.2     40.0 Mon, 21 Nov 2022      40.0     2          30.8 % 33.4 % 90.0 % 91.0 % 35.9      40.0 Tue, 22 Nov 2022       40.0     2          28.8 % 33.3 % 89.1 % 91.0 % 32.7     40.0 Wed, 23 Nov 2022      20.0     1          26.8 % 20.8 % 88.1 % 84.1 % 29.6     20.0 Thu, 24 Nov 2022       40.0     2          25.1 % 30.2 % 87.1 % 89.7 % 31.7     40.0 Fri, 25 Nov 2022         20.0     1          23.4 % 20.1 % 86.0 % 83.5 % 25.3     20.0 Sat, 26 Nov 2022        20.0     1          21.8 % 17.5 % 84.9 % 81.0 % 26.9     20.0 Sun, 27 Nov 2022       20.0     1          20.4 % 16.9 % 83.7 % 80.4 % 25.6     20.0 Mon, 28 Nov 2022      20.0     1          19.0 % 16.7 % 82.5 % 80.2 % 23.6     20.0 Tue, 29 Nov 2022       20.0     1          17.7 % 16.7 % 81.3 % 80.1 % 21.7     20.0 Wed, 30 Nov 2022      20.0     1          16.5 % 16.7 % 80.0 % 80.1 % 19.8     20.0 Thu, 01 Dec 2022       20.0     1  15.4 % 16.7 % 78.6 % 80.1 % 18.0     20.0 Fri, 02 Dec 2022         20.0     1          14.4 % 16.7 % 77.2 % 80.1 % 16.4     20.0 Sat, 03 Dec 2022        20.0     1          13.4 % 16.7 % 75.8 % 80.1 % 14.8     20.0 Sun, 04 Dec 2022       20.0     1          12.5 % 16.7 % 74.3 % 80.1 % 13.4     20.0 Mon, 05 Dec 2022      20.0     1          11.7 % 16.7 % 72.7 % 80.1 % 12.1     20.0 Tue, 06 Dec 2022       20.0     1          10.9 % 16.7 % 71.2 % 80.1 % 10.8     20.0 Wed, 07 Dec 2022      0.0                   10.2 % 4.2 %   69.6 % 46.7 % 9.6       0.0 Thu, 08 Dec 2022       20.0     1          9.5 %   13.5 % 67.9 % 75.9 % 13.5     20.0 Fri, 09 Dec 2022         0.0                   8.9 %   3.4 %   66.2 % 41.4 % 8.8       0.0 Sat, 10 Dec 2022        20.0     1          8.3 %   13.3 % 64.5 % 75.6 % 11.9     20.0 Sun, 11 Dec 2022       0.0                   7.7 %   3.3 %   62.8 % 41.0 % 7.0       0.0 Mon, 12 Dec 2022      20.0     1          7.2 %   13.3 % 61.0 % 75.6 % 10.2     20.0 Tue, 13 Dec 2022       0.0                   6.7 %   3.3 %   59.2 % 41.0 % 5.4       0.0 Wed, 14 Dec 2022      0.0                    6.3 %   0.8 %   57.4 % 14.5 % 8.7       0.0 Thu, 15 Dec 2022       0.0  5.9 %   0.2 %   55.6 % 4.0 %   9.0       0.0 Fri, 16 Dec 2022         0.0                   5.5 %   0.1 %   53.8 % 1.0 %   8.7       0.0 Total doses                82

## 2022-11-07 ENCOUNTER — Ambulatory Visit (INDEPENDENT_AMBULATORY_CARE_PROVIDER_SITE_OTHER): Payer: Medicaid Other | Admitting: Licensed Clinical Social Worker

## 2022-11-07 DIAGNOSIS — F1020 Alcohol dependence, uncomplicated: Secondary | ICD-10-CM

## 2022-11-07 DIAGNOSIS — F39 Unspecified mood [affective] disorder: Secondary | ICD-10-CM

## 2022-11-07 DIAGNOSIS — F1220 Cannabis dependence, uncomplicated: Secondary | ICD-10-CM

## 2022-11-07 DIAGNOSIS — F17200 Nicotine dependence, unspecified, uncomplicated: Secondary | ICD-10-CM

## 2022-11-07 DIAGNOSIS — F1120 Opioid dependence, uncomplicated: Secondary | ICD-10-CM

## 2022-11-07 NOTE — Progress Notes (Signed)
Daily Group Progress Note   Program: CD IOP     Individual Time: 9 a.m. to 12 p.m.    Type of Therapy: Process and Psychoeducational    Topic: The therapist checks in with group members, assesses for SI/HI/psychosis and overall level of functioning. The therapist inquires about sobriety date and number of community support meetings attended since last session.   The therapist discusses the consequences of getting a DWI which three of the four group members have experienced. The therapist notes that working Step Three is challenging for many people especially if people have trauma associated with organized religion and "God." For example, there are people who have been traumatized at [facility] or who believe that God was not to be found when they were suffering or people who have an adverse reaction to "God" having been forced to go to [place], Armed forces training and education officer.  The therapist facilitates a discussion on when to get a Sponsor and how to navigate issues when a person feels confused by his or her Sponsor's direction.   Summary: [name] presents rating her depression as an "3" and anxiety as a "6."   She talks about having had a DWI in the past and having not had a license since saying that she will have to have a "blow-n-go" when she does get a car and starts driving again.  She went to her first AA meeting in person saying that she enjoyed it but has some confusion over if the chip she picked up was to join [place] or a home group. The therapist and other group members explain this to her and inform her that she can wait until she has attended a lot of different meetings before picking a home group. [name] says that a woman offered to be her temporary Sponsor but [name] thought it was "too soon" with the therapist informing her that it is not.   [name] says that her mood is "hopeful" and "overwhelmed" and that she was feeling guilty on Saturday night with sleep difficulties; however, not knowing for what. Her mother  speculated that it still relates to [name]'s father. The therapist observes that [name] is still very early in the grief process and says that when she has her Sponsor that she can call her when feeling this way even when not thinking of drinking though [name] admits to thoughts of drinking when she felt this way but it was manageable such that she did not drink.   Progress Towards Goals: [name] reports no change in her sobriety date   UDS collected: Yes Results: Yes, negative for drugs and alcohol   AA/NA attended?: Yes   Sponsor?: No   Adam Phenix, [place], LCSW, [facility], LCAS 11/07/2022

## 2022-11-09 ENCOUNTER — Ambulatory Visit (INDEPENDENT_AMBULATORY_CARE_PROVIDER_SITE_OTHER): Payer: Medicaid Other | Admitting: Licensed Clinical Social Worker

## 2022-11-09 ENCOUNTER — Ambulatory Visit: Payer: Medicaid Other | Admitting: Physician Assistant

## 2022-11-09 DIAGNOSIS — F39 Unspecified mood [affective] disorder: Secondary | ICD-10-CM

## 2022-11-09 DIAGNOSIS — F1020 Alcohol dependence, uncomplicated: Secondary | ICD-10-CM | POA: Diagnosis not present

## 2022-11-09 DIAGNOSIS — F1120 Opioid dependence, uncomplicated: Secondary | ICD-10-CM

## 2022-11-09 DIAGNOSIS — F1220 Cannabis dependence, uncomplicated: Secondary | ICD-10-CM

## 2022-11-09 DIAGNOSIS — F17200 Nicotine dependence, unspecified, uncomplicated: Secondary | ICD-10-CM

## 2022-11-09 NOTE — Progress Notes (Signed)
Daily Group Progress Note   Program: CD IOP     Individual Time: 9 a.m. to 12 p.m.    Type of Therapy: Process and Psychoeducational    Topic: The therapist checks in with group members, assesses for SI/HI/psychosis and overall level of functioning. The therapist inquires about sobriety date and number of community support meetings attended since last session.   The therapist focuses again on why learning to become assertive is critical in regard to developing healthy relationships which is turn is critical to one's recovery giving examples of this while modeling assertive responses. He discusses the reason that working the 4th Step is so challenging for people and also discusses the problems that arise when getting sober while one's partner or significant other does not. The therapist educates group members on how to use radical acceptance" and educates them on what an [facility] is and how it can be beneficial to many in recovery.     Summary: [name] presents rating her depression as an "4" and anxiety as an "8."   She has attended two meetings and picked up her 30 day chip saying that she felt accepted and supported when she did so. At the same time, she talks about feeling "lonely' when at home with her boyfriend. Upon further exploration, this loneliness stems from the fact that [name] is in recovery but her boyfriend has not done what he promised as he is secretly drinking as [name] expected finding his alcohol hidden in the laundry room. She has not made him aware of this not wanting conflict. She now has a Temporary Sponsor admitting that when her Sponsor asked her if her boyfriend drank that she lied to her about it as she did not want to deal with it and her boyfriend was in the background when she was on the phone with her Sponsor.   [name] says that she is going to tell her Sponsor the truth. The therapist normalizes [name]'s issue in addition to talking about the loneliness epidemic discussed  by the Surgeon General  and how attending AA can reduce this. In response to [name]'s apologizing for talking about so much of her personal business, the therapist informs her that this is what group is for. [name] leaves group with two other members to go to a meeting. The therapist makes her aware of [employer].    Progress Towards Goals: [name] reports no change in her sobriety date   UDS collected: No Results: No   AA/NA attended?: Yes   Sponsor?: Yes   Adam Phenix, [place], [place], [facility], [place] 11/09/2022

## 2022-11-11 ENCOUNTER — Ambulatory Visit (INDEPENDENT_AMBULATORY_CARE_PROVIDER_SITE_OTHER): Payer: Medicaid Other | Admitting: Licensed Clinical Social Worker

## 2022-11-11 DIAGNOSIS — F1020 Alcohol dependence, uncomplicated: Secondary | ICD-10-CM

## 2022-11-11 DIAGNOSIS — F39 Unspecified mood [affective] disorder: Secondary | ICD-10-CM

## 2022-11-11 DIAGNOSIS — F1220 Cannabis dependence, uncomplicated: Secondary | ICD-10-CM

## 2022-11-11 DIAGNOSIS — F17200 Nicotine dependence, unspecified, uncomplicated: Secondary | ICD-10-CM

## 2022-11-11 DIAGNOSIS — F1120 Opioid dependence, uncomplicated: Secondary | ICD-10-CM

## 2022-11-11 NOTE — Progress Notes (Signed)
Daily Group Progress Note   Program: CD IOP     Individual Time: 9 a.m. to 12 p.m.    Type of Therapy: Process and Psychoeducational    Topic: The therapist checks in with group members, assesses for SI/HI/psychosis and overall level of functioning. The therapist inquires about sobriety date and number of community support meetings attended since last session.   The therapist talks about how one goes about choosing a Sponsor and that one has to find the right fit so may have to change Sponsors which is okay. He educates group members on the long-term impact that alcohol has on a person's sleep pattern and how long it will take for this to return to normal. He emphasizes that no matter what organization a person joins that as long as it has people that none will be perfect and there will always be challenging people. He shares research about recovery taking place in Fellowship but notes that this Fellowship does not just have to be AA or NA but there are other options for developing a non-using support system.   The therapist introduces two new group members today while saying goodbye to two who are graduating the program.      Summary: Markeeta presents rating her depression as a "7" and anxiety as an "8."   She says that she has been having a lot of bad, vivid dreams lately. She is supposed to be taking 150 mg of Trazodone but is only taking 100 mg to avoid feeling groggy the next morning. The therapist informs her that she could be experiencing nightmares due to rebound REM from her long history of drinking and is likely dealing with weaning off Cymbalta in addition to stressors at home. The therapist suggests that she could take the 150 mg of Trazodone but take is earlier before bedtime to mitigate the "hung over" feeling the next morning.   Lasonya says that she is "sleepy" and "tired" but proud of herself. She has not heard much from her Temporary Sponsor with the therapist encouraging her to ask  her Temporary Sponsor when they are going to get together and if her Sponsor is too occupied with her grandchildren and does not have enough time to provide [place] with the support that she needs to change Sponsors.   Mykisha notes that she feels good when out with her daughter and others but that the depression sets in when she is home with her boyfriend who is continuing to drink. She says that the way that he acts when drinking makes her not want to drink and act like he does.   Another group member who attends meetings with Mailee notes that when they went to the meeting where she picked up her 30 days that everyone in the meeting said, "Maecyn [place]" when she came in which Yesenya admits made her feel good.   Progress Towards Goals: Bora reports no change in her sobriety date   UDS collected: No Results: No   AA/NA attended?: Yes   Sponsor?: Yes   Adam Phenix, [place], [place], [facility], [place] 11/11/2022

## 2022-11-14 ENCOUNTER — Encounter (HOSPITAL_COMMUNITY): Payer: Self-pay | Admitting: Licensed Clinical Social Worker

## 2022-11-14 ENCOUNTER — Ambulatory Visit (INDEPENDENT_AMBULATORY_CARE_PROVIDER_SITE_OTHER): Payer: Medicaid Other

## 2022-11-14 ENCOUNTER — Encounter: Payer: Self-pay | Admitting: Gastroenterology

## 2022-11-14 ENCOUNTER — Ambulatory Visit (INDEPENDENT_AMBULATORY_CARE_PROVIDER_SITE_OTHER): Payer: Medicaid Other | Admitting: Licensed Clinical Social Worker

## 2022-11-14 ENCOUNTER — Ambulatory Visit (INDEPENDENT_AMBULATORY_CARE_PROVIDER_SITE_OTHER): Payer: Medicaid Other | Admitting: Podiatry

## 2022-11-14 DIAGNOSIS — F4312 Post-traumatic stress disorder, chronic: Secondary | ICD-10-CM

## 2022-11-14 DIAGNOSIS — G621 Alcoholic polyneuropathy: Secondary | ICD-10-CM

## 2022-11-14 DIAGNOSIS — Z9889 Other specified postprocedural states: Secondary | ICD-10-CM

## 2022-11-14 DIAGNOSIS — T7491XS Unspecified adult maltreatment, confirmed, sequela: Secondary | ICD-10-CM

## 2022-11-14 DIAGNOSIS — Z8679 Personal history of other diseases of the circulatory system: Secondary | ICD-10-CM

## 2022-11-14 DIAGNOSIS — K7030 Alcoholic cirrhosis of liver without ascites: Secondary | ICD-10-CM

## 2022-11-14 DIAGNOSIS — F1220 Cannabis dependence, uncomplicated: Secondary | ICD-10-CM

## 2022-11-14 DIAGNOSIS — G894 Chronic pain syndrome: Secondary | ICD-10-CM

## 2022-11-14 DIAGNOSIS — F4321 Adjustment disorder with depressed mood: Secondary | ICD-10-CM

## 2022-11-14 DIAGNOSIS — F1020 Alcohol dependence, uncomplicated: Secondary | ICD-10-CM

## 2022-11-14 DIAGNOSIS — F17200 Nicotine dependence, unspecified, uncomplicated: Secondary | ICD-10-CM

## 2022-11-14 DIAGNOSIS — M2041 Other hammer toe(s) (acquired), right foot: Secondary | ICD-10-CM

## 2022-11-14 DIAGNOSIS — F1120 Opioid dependence, uncomplicated: Secondary | ICD-10-CM

## 2022-11-14 DIAGNOSIS — T7432XS Child psychological abuse, confirmed, sequela: Secondary | ICD-10-CM

## 2022-11-14 DIAGNOSIS — F39 Unspecified mood [affective] disorder: Secondary | ICD-10-CM

## 2022-11-14 MED ORDER — CICLOPIROX 8 % EX SOLN: Freq: Every day | CUTANEOUS | 2 refills | Status: DC

## 2022-11-14 MED ORDER — BUPROPION HCL ER (XL) 150 MG PO TB24: 150.0000 mg | ORAL_TABLET | ORAL | 2 refills | Status: DC

## 2022-11-14 MED ORDER — CELECOXIB 100 MG PO CAPS: 100.0000 mg | ORAL_CAPSULE | Freq: Two times a day (BID) | ORAL | 2 refills | Status: DC
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
## 2022-11-14 NOTE — Progress Notes (Unsigned)
Daily Group Progress Note   Program: CD IOP     Individual Time: 9 a.m. to 12 p.m.    Type of Therapy: Process and Psychoeducational    Topic: The therapist checks in with group members, assesses for SI/HI/psychosis and overall level of functioning. The therapist inquires about sobriety date and number of community support meetings attended since last session.   The therapist educates group members about aftercare options upon completing CD IOP, facilitates a discussion on what makes a good Counselor, and answers questions regarding how to talk to one's children about addiction in an age-appropriate manner. As there are two new group members, the therapist has group members tell their stories of addiction and how they came to be in CD IOP.    Summary: Aavya presents rating her depression as a "8" and anxiety as an "10."  She says that her mood is "anxious" and that she has attended one meeting since last in group. She attended a women's meeting that was a speaker meeting that she enjoyed. Emmalynn says that her mood is "depressed" and "frustrated." She finds her boyfriend irritating when he drinks as he tends to repeat himself. She says that she is more irritable since being off Cymbalta saying that her whole body hurts liek she has the flu.   Jolayne checks in with her Sponsor daily and is going to ask her Sponsor today when they are going to start working steps. Time runs out before she is able to share her story with group.    Progress Towards Goals: Rebia reports no change in her sobriety date   UDS collected: Yes Results: Yes, negative for drugs and alcohol   AA/NA attended?: Yes   Sponsor?: Yes   Adam Phenix, [place], [place], [facility], [place] 11/14/2022

## 2022-11-14 NOTE — Patient Instructions (Signed)
Ciclopirox Topical Solution What is this medication? CICLOPIROX (sye kloe PEER ox) treats fungal infections of the nails. It belongs to a group of medications called antifungals. It will not treat infections caused by bacteria or viruses. This medicine may be used for other purposes; ask your health care provider or pharmacist if you have questions. COMMON BRAND NAME(S): Ciclodan Nail Solution, CNL8, [place] What should I tell my care team before I take this medication? They need to know if you have any of these conditions: Diabetes (high blood sugar) Immune system problems Organ transplant Receiving steroid inhalers, cream, or lotion Seizures Tingling of the fingers or toes or other nerve disorder An unusual or allergic reaction to ciclopirox, other medications, foods, dyes, or preservatives Pregnant or trying to get pregnant Breast-feeding How should I use this medication? This medication is for external use only. Do not take by mouth. Wash your hands before and after use. If you are treating your hands, only wash your hands before use. Do not get it in your eyes. If you do, rinse your eyes with plenty of cool tap water. Use it as directed on the prescription label at the same time every day. Do not use it more often than directed. Use the medication for the full course as directed by your care team, even if you think you are better. Do not stop using it unless your care team tells you to stop it early. Apply a thin film of the medication to the affected area. Talk to your care team about the use of this medication in children. While it may be prescribed for children as young as 12 years for selected conditions, precautions do apply. Overdosage: If you think you have taken too much of this medicine contact a poison control center or emergency room at once. NOTE: This medicine is only for you. Do not share this medicine with others. What if I miss a dose? If you miss a dose, use it as soon as  you can. If it is almost time for your next dose, use only that dose. Do not use double or extra doses. What may interact with this medication? Interactions are not expected. Do not use any other skin products without telling your care team. This list may not describe all possible interactions. Give your health care provider a list of all the medicines, herbs, non-prescription drugs, or dietary supplements you use. Also tell them if you smoke, drink alcohol, or use illegal drugs. Some items may interact with your medicine. What should I watch for while using this medication? Visit your care team for regular checks on your progress. It may be some time before you see the benefit from this medication. Do not use nail polish or other nail cosmetic products on the treated nails. Removal of the unattached, infected nail by your care team is needed with use of this medication. If you have diabetes or numbness in your fingers or toes, talk to your care team about proper nail care. What side effects may I notice from receiving this medication? Side effects that you should report to your care team as soon as possible: Allergic reactions--skin rash, itching, hives, swelling of the face, lips, tongue, or throat Burning, itching, crusting, or peeling of treated skin Side effects that usually do not require medical attention (report to your care team if they continue or are bothersome): Change in nail shape, thickness, or color Mild skin irritation, redness, or dryness This list may not describe all possible side  effects. Call your doctor for medical advice about side effects. You may report side effects to FDA at 1-800-FDA-1088. Where should I keep my medication? Keep out of the reach of children and pets. Store at room temperature between 20 and 25 degrees C (68 and 77 degrees F). This medication is flammable. Avoid exposure to heat, fire, flame, and smoking. Get rid of medications that are no longer needed  or have expired: Take the medication to a medication take-back program. Check with your pharmacy or law enforcement to find a location. If you cannot return the medication, check the label or package insert to see if the medication should be thrown out in the garbage or flushed down the toilet. If you are not sure, ask your care team. If it is safe to put in the trash, take the medication out of the container. Mix the medication with cat litter, dirt, coffee grounds, or other unwanted substance. Seal the mixture in a bag or container. Put it in the trash. NOTE: This sheet is a summary. It may not cover all possible information. If you have questions about this medicine, talk to your doctor, pharmacist, or health care provider.  2023 Elsevier/Gold Standard (2021-08-10 00:00:00)

## 2022-11-16 ENCOUNTER — Ambulatory Visit (INDEPENDENT_AMBULATORY_CARE_PROVIDER_SITE_OTHER): Payer: Medicaid Other | Admitting: Licensed Clinical Social Worker

## 2022-11-16 ENCOUNTER — Encounter (HOSPITAL_COMMUNITY): Payer: Self-pay | Admitting: Medical

## 2022-11-16 DIAGNOSIS — F1020 Alcohol dependence, uncomplicated: Secondary | ICD-10-CM | POA: Diagnosis not present

## 2022-11-16 DIAGNOSIS — F1220 Cannabis dependence, uncomplicated: Secondary | ICD-10-CM

## 2022-11-16 DIAGNOSIS — F341 Dysthymic disorder: Secondary | ICD-10-CM

## 2022-11-16 DIAGNOSIS — F4321 Adjustment disorder with depressed mood: Secondary | ICD-10-CM

## 2022-11-16 DIAGNOSIS — T7432XS Child psychological abuse, confirmed, sequela: Secondary | ICD-10-CM

## 2022-11-16 DIAGNOSIS — F419 Anxiety disorder, unspecified: Secondary | ICD-10-CM

## 2022-11-16 DIAGNOSIS — T7491XS Unspecified adult maltreatment, confirmed, sequela: Secondary | ICD-10-CM

## 2022-11-16 DIAGNOSIS — G621 Alcoholic polyneuropathy: Secondary | ICD-10-CM

## 2022-11-16 DIAGNOSIS — Z7189 Other specified counseling: Secondary | ICD-10-CM

## 2022-11-16 DIAGNOSIS — F1120 Opioid dependence, uncomplicated: Secondary | ICD-10-CM

## 2022-11-16 DIAGNOSIS — K7030 Alcoholic cirrhosis of liver without ascites: Secondary | ICD-10-CM

## 2022-11-16 DIAGNOSIS — F4312 Post-traumatic stress disorder, chronic: Secondary | ICD-10-CM

## 2022-11-16 DIAGNOSIS — G894 Chronic pain syndrome: Secondary | ICD-10-CM

## 2022-11-16 DIAGNOSIS — Z811 Family history of alcohol abuse and dependence: Secondary | ICD-10-CM

## 2022-11-16 DIAGNOSIS — T50905D Adverse effect of unspecified drugs, medicaments and biological substances, subsequent encounter: Secondary | ICD-10-CM

## 2022-11-16 DIAGNOSIS — Z8679 Personal history of other diseases of the circulatory system: Secondary | ICD-10-CM
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
## 2022-11-17 ENCOUNTER — Ambulatory Visit: Payer: Medicaid Other | Admitting: Physician Assistant

## 2022-11-17 ENCOUNTER — Encounter: Payer: Self-pay | Admitting: Physician Assistant

## 2022-11-17 ENCOUNTER — Ambulatory Visit: Payer: Medicaid Other | Attending: Physician Assistant | Admitting: Physician Assistant

## 2022-11-17 VITALS — BP 107/69 | HR 69 | Wt 129.4 lb

## 2022-11-17 DIAGNOSIS — F1011 Alcohol abuse, in remission: Secondary | ICD-10-CM

## 2022-11-17 DIAGNOSIS — E782 Mixed hyperlipidemia: Secondary | ICD-10-CM | POA: Diagnosis not present

## 2022-11-17 DIAGNOSIS — R748 Abnormal levels of other serum enzymes: Secondary | ICD-10-CM | POA: Diagnosis not present

## 2022-11-17 DIAGNOSIS — R7989 Other specified abnormal findings of blood chemistry: Secondary | ICD-10-CM | POA: Diagnosis not present

## 2022-11-17 NOTE — Progress Notes (Signed)
Patient ID: [name], female   DOB: [birth year], 52 y.o.   MRN: [id number]   Devanne Benoy, is a 52 y.o. female  [id number]  [id number]  DOB - [birth year]  No chief complaint on file.      Subjective:   [name] is a 52 y.o. female here today for recheck LFT and cholesterol.  She is sober now since 1/28 from alcohol and 1/29 from [facility].  She is doing well and going to [place] meetings and IOP.  She has a sponsor and is feeling good physically.  She is managed by [facility] and they are currently titrating her off of cymbalta and have started bupropion for depression and have prescribed celebrex for pain.  No abdominal pain.    No problems updated.  ALLERGIES: Allergies  Allergen Reactions   Cymbalta [Duloxetine Hcl] Other (See Comments)    Cirrhosis Pt on 6 week taper   Macrobid [Nitrofurantoin Macrocrystal] Anaphylaxis and Other (See Comments)    Patient reports 2 syncope episodes after her mouth became numb    PAST MEDICAL HISTORY: Past Medical History:  Diagnosis Date   Anxiety    Arthritis    right foot   Cirrhosis ([place])    GERD (gastroesophageal reflux disease)    History of alcoholism ([place])    History of cellulitis    2015  right side face   Hypertension    followed by pcp  (per pt never had stress test)   Malnutrition ([place])    Metatarsal fracture    right 2nd   Peripheral neuropathy    feet   Strachan's syndrome    painful neuropathy   Wears dentures    upper   Wears glasses     MEDICATIONS AT HOME: Prior to Admission medications   Medication Sig Start Date End Date Taking? Authorizing Provider  amLODipine (NORVASC) 5 MG tablet Take 1 tablet (5 mg total) by mouth daily. 02/16/22  Yes Newlin, Charlane Ferretti, MD  buPROPion (WELLBUTRIN XL) 150 MG 24 hr tablet Take 1 tablet (150 mg total) by mouth every morning. 11/14/22 11/14/23 Yes Dara Hoyer, PA-C  busPIRone (BUSPAR) 10 MG tablet Take 1 tablet (10 mg total) by mouth 2 (two) times daily. 11/02/22  Yes Charlott Rakes,  MD  celecoxib (CELEBREX) 100 MG capsule Take 1 capsule (100 mg total) by mouth 2 (two) times daily. 11/14/22 11/14/23 Yes Dara Hoyer, PA-C  ciclopirox [facility]) 8 % solution Apply topically at bedtime. Apply over nail and surrounding skin. Apply daily over previous coat. After seven (7) days, may remove with alcohol and continue cycle. 11/14/22  Yes Trula Slade, [name]  cloNIDine (CATAPRES) 0.1 MG tablet Take 1 tablet (0.1 mg total) by mouth at bedtime. For hot flashes 02/16/22  Yes Charlott Rakes, MD  DULoxetine (CYMBALTA) 20 MG capsule Take number of capsules by 6 week withdrawal calendar 11/04/22  Yes Dara Hoyer, PA-C  gabapentin (NEURONTIN) 300 MG capsule Take 2 capsules (600 mg total) by mouth 3 (three) times daily. 10/17/22  Yes Patel, Donika K, DO  hydrALAZINE (APRESOLINE) 10 MG tablet TAKE 1 TABLET BY MOUTH THREE TIMES A DAY 09/23/22  Yes Charlott Rakes, MD  Multiple Vitamins-Minerals (MULTIVITAMIN WITH MINERALS) tablet Take 1 tablet by mouth daily.   Yes [provider]  naltrexone (DEPADE) 50 MG tablet Take 1 tablet (50 mg total) by mouth daily. 11/02/22 01/31/23 Yes Dara Hoyer, PA-C  omeprazole (PRILOSEC) 20 MG capsule TAKE 1 CAPSULE BY MOUTH EVERY DAY 09/13/22  Yes Charlott Rakes, MD  potassium chloride SA (KLOR-CON M) 20 MEQ tablet Take 2 tablets (40 mEq total) by mouth daily. 09/02/22  Yes Danis, Kirke Corin, MD  pregabalin (LYRICA) 150 MG capsule Take 150 mg by mouth 3 (three) times daily.   Yes Patel, Donika K, DO    ROS: Neg HEENT Neg resp Neg cardiac Neg GI Neg GU Neg MS Neg psych Neg neuro  Objective:   Vitals:   11/17/22 1440  BP: 107/69  Pulse: 69  SpO2: 97%  Weight: 129 lb 6.4 oz (58.7 kg)   Exam General appearance : Awake, alert, not in any distress. Speech Clear. Not toxic looking HEENT: Atraumatic and Normocephalic Neck: Supple, no JVD. No cervical lymphadenopathy.  Chest: Good air entry bilaterally, CTAB.  No rales/rhonchi/wheezing CVS:  S1 S2 regular, no murmurs.  Extremities: B/L Lower Ext shows no edema, both legs are warm to touch Neurology: Awake alert, and oriented X 3, CN II-XII intact, Non focal Skin: No Rash  Data Review Lab Results  Component Value Date   HGBA1C 5.3 08/31/2021   HGBA1C 5.1 (A) 06/12/2019   HGBA1C 4.8 12/20/2013    Assessment & Plan   1. Elevated LFTs Hopefully resolved/resolving in sobriety - Comprehensive metabolic panel  2. Mixed hyperlipidemia If her cholesterol is still elevated, she would like to start meds if her liver function will allow it.   - Lipid panel  3. Alcohol abuse, in remission Great work on sobriety since 10/10/2022!!!! [place].org is the website for narcotics anonymous [place] (website) or [phone] is the information for alcoholics anonymous Both are free and immediately available for help with alcohol and drug use I have counseled the patient at length about substance abuse and addiction.  12 step meetings/recovery recommended.  Local 12 step meeting lists were given and attendance was encouraged.  Patient expresses understanding.      Return in about 4 months (around 03/19/2023) for PCP for chronic conditions.  The patient was given clear instructions to go to ER or return to medical center if symptoms don't improve, worsen or new problems develop. The patient verbalized understanding. The patient was told to call to get lab results if they haven't heard anything in the next week.      Freeman Caldron, PA-C [facility] [place], [place]   11/17/2022, 2:58 PM

## 2022-11-17 NOTE — Patient Instructions (Signed)
[place].org is the website for narcotics anonymous [place] (website) or (812)727-0014 is the information for alcoholics anonymous Both are free and immediately available for help with alcohol and drug use

## 2022-11-17 NOTE — Progress Notes (Signed)
Daily Group Progress Note   Program: CD IOP     Individual Time: 9 a.m. to 1 p.m.    Type of Therapy: Process and Psychoeducational    Topic: The therapist checks in with group members, assesses for SI/HI/psychosis and overall level of functioning. The therapist inquires about sobriety date and number of community support meetings attended since last session.   The therapist discusses the importance of getting quality sleep in relation to one's mental and sobriety and explains how light therapy lamps are used to treat persons with SAD. He educates group members on the fact that willpower is finite such that attempting to engage in too many activities at one time that involve use of willpower can exhaust one's reserves. The therapist educates members on what Alanon is and who can benefit from it while explaining why the disease of addiction is described as being "baffling and cunning." He suggests that people in long-term recovery can continue to attends Twelve Step meetings but additionally should work on incorporating other hobbies and activities that allow for building non-using supports and allowing for them to have fun and enjoy their sobriety.    Summary: [name] presents rating her depression as a "5" and anxiety as an "7."  She talks about her problems with figuring out what she should or should not eat due to her liver problems with it being suggested that she talk to her PCP about a referral to a Nutritionist. She describes her mood as "confident" and "inspired."   She attended one AA meeting and says that she still needs to ask her Sponsor when they will start steps. At the conclusion of group, she says that the two biggest things she got out of group for her were about the use of light therapy and how one's disease is "devious" so as to recognize when her disease is talking to her.   Progress Towards Goals: Anakaren reports no change in her sobriety date   UDS collected: No Results: No    AA/NA attended?: Yes   Sponsor?: Yes   Adam Phenix, [place], [place], [facility], [place] 11/16/2022

## 2022-11-18 ENCOUNTER — Other Ambulatory Visit: Payer: Self-pay | Admitting: Physician Assistant

## 2022-11-18 ENCOUNTER — Ambulatory Visit (INDEPENDENT_AMBULATORY_CARE_PROVIDER_SITE_OTHER): Payer: Medicaid Other | Admitting: Licensed Clinical Social Worker

## 2022-11-18 DIAGNOSIS — F17200 Nicotine dependence, unspecified, uncomplicated: Secondary | ICD-10-CM

## 2022-11-18 DIAGNOSIS — F4312 Post-traumatic stress disorder, chronic: Secondary | ICD-10-CM

## 2022-11-18 DIAGNOSIS — F1120 Opioid dependence, uncomplicated: Secondary | ICD-10-CM

## 2022-11-18 DIAGNOSIS — F1020 Alcohol dependence, uncomplicated: Secondary | ICD-10-CM | POA: Diagnosis not present

## 2022-11-18 DIAGNOSIS — E782 Mixed hyperlipidemia: Secondary | ICD-10-CM

## 2022-11-18 DIAGNOSIS — F1220 Cannabis dependence, uncomplicated: Secondary | ICD-10-CM

## 2022-11-18 DIAGNOSIS — R748 Abnormal levels of other serum enzymes: Secondary | ICD-10-CM

## 2022-11-18 LAB — LIPID PANEL
Chol/HDL Ratio: 5.8 ratio — ABNORMAL HIGH (ref 0.0–4.4)
Cholesterol, Total: 233 mg/dL — ABNORMAL HIGH (ref 100–199)
HDL: 40 mg/dL (ref >39)
LDL Chol Calc (NIH): 162 mg/dL — ABNORMAL HIGH (ref 0–99)
Triglycerides: 169 mg/dL — ABNORMAL HIGH (ref 0–149)
VLDL Cholesterol Cal: 31 mg/dL (ref 5–40)

## 2022-11-18 LAB — COMPREHENSIVE METABOLIC PANEL
ALT: 23 IU/L (ref 0–32)
AST: 31 IU/L (ref 0–40)
Albumin/Globulin Ratio: 1.4 (ref 1.2–2.2)
Albumin: 4.3 g/dL (ref 3.8–4.9)
Alkaline Phosphatase: 203 IU/L — ABNORMAL HIGH (ref 44–121)
BUN/Creatinine Ratio: 8 — ABNORMAL LOW (ref 9–23)
BUN: 6 mg/dL (ref 6–24)
Bilirubin Total: 0.2 mg/dL (ref 0.0–1.2)
CO2: 24 mmol/L (ref 20–29)
Calcium: 9.8 mg/dL (ref 8.7–10.2)
Chloride: 98 mmol/L (ref 96–106)
Creatinine, Ser: 0.73 mg/dL (ref 0.57–1.00)
Globulin, Total: 3.1 g/dL (ref 1.5–4.5)
Glucose: 83 mg/dL (ref 70–99)
Potassium: 4.5 mmol/L (ref 3.5–5.2)
Sodium: 140 mmol/L (ref 134–144)
Total Protein: 7.4 g/dL (ref 6.0–8.5)
eGFR: 100 mL/min/{1.73_m2} (ref >59)

## 2022-11-18 MED ORDER — ATORVASTATIN CALCIUM 10 MG PO TABS: 10.0000 mg | ORAL_TABLET | Freq: Every day | ORAL | 3 refills | Status: DC

## 2022-11-18 NOTE — Progress Notes (Signed)
Daily Group Progress Note   Program: CD IOP     Individual Time: 9 a.m. to 12 p.m.    Type of Therapy: Process and Psychoeducational    Topic: The therapist checks in with group members, assesses for SI/HI/psychosis and overall level of functioning. The therapist inquires about sobriety date and number of community support meetings attended since last session.   The therapist educates group members on what is meant by stages of change and what motivational interviewing is. He reminds them that things like "90 in 52" are a "suggestion" and not an absolute as people have other things in their lives to which they need to attend such as work, family, recreation, attendance to religious services, Armed forces training and education officer. He highlights the importance of keeping one's life in balance such that one does not overdo one area thus neglecting another. He explains how addiction is both chronic and progressive and that due to being progressive that the longer people use, the more difficulty they find it to have both family and addiction, work and addiction, Armed forces training and education officer. He validates that letting go of one's substance use involves grieving and that recovery requires honesty. The therapist notes the many reasons that people in recovery often say what they thing other people want to hear versus the truth. He educates them on what is meant by being "[place] sober" and how using any addictive substance can activate the addictive pathway.     The therapist shows a video that illustrates that there is no such thing as a hard versus a soft drug.    Summary: Diavion presents rating her depression as a "4" and anxiety as an "6."  She informs this therapist that she went to her doctor recently and that her liver enzymes had improved for the first time such that she can be put on a cholesterol medication. Ciena says that this is very good news with the therapist noting that if she continues with her sobriety that things with her liver are  likely to improve even more.   Treesa is active in today's group discussion and like the other group members present saying that she particularly liked the change in group format today.  Progress Towards Goals: Rucha reports no change in her sobriety date   UDS collected: No Results: No   AA/NA attended?: Yes   Sponsor?: Yes   Adam Phenix, [place], [place], [facility], [place] 11/18/2022

## 2022-11-20 NOTE — Progress Notes (Signed)
Subjective: Chief Complaint  Patient presents with   Follow-up    4 weeks (around 09/05/2022) POV # [place] is a 52 y.o. is seen today in office s/p right second digit hammertoe , second MPJ capsulotomy preformed on 06/22/2022.  States that she is doing better.  She did do physical therapy for a little bit however she did go to rehab so she had discontinued it but she does feel that it was helping.  The swelling is improving.  She is wearing regular shoe.  Still has some soreness with the toe.  No fevers or chills.  No other concerns.   Objective: General: No acute distress, AAOx3  DP/PT pulses palpable 2/4, CRT < 3 sec to all digits.  Protective sensation intact. Motor function intact.  Right foot: Incision is well coapted without any evidence of dehiscence and scar has formed.  Toe is rectus.  There is still some residual edema present but there is no erythema or warmth.  Mild tenderness palpation.  No other areas of discomfort today. No pain with calf compression, swelling, warmth, erythema.   Assessment and Plan:  Status post right second digit hammertoe surgery, doing well with no complications   -Treatment options discussed including all alternatives, risks, and complications -X-rays were obtained reviewed.  3 views were obtained.  Status post hammertoe repair second digit with increased consolidation noted. -Patient was doing better with therapy moderate order this for her.  She can remain a regular shoe hopefully with increased range of motion and normal walking her pain will continue to improve.  Continue ice, elevate as well as compression.  Return in about 6 weeks (around 12/26/2022).  Trula Slade [name]

## 2022-11-21 ENCOUNTER — Telehealth: Payer: Self-pay | Admitting: Emergency Medicine

## 2022-11-21 ENCOUNTER — Other Ambulatory Visit: Payer: Self-pay | Admitting: Family Medicine

## 2022-11-21 ENCOUNTER — Ambulatory Visit (INDEPENDENT_AMBULATORY_CARE_PROVIDER_SITE_OTHER): Payer: Medicaid Other | Admitting: Licensed Clinical Social Worker

## 2022-11-21 DIAGNOSIS — F1020 Alcohol dependence, uncomplicated: Secondary | ICD-10-CM

## 2022-11-21 DIAGNOSIS — Z1231 Encounter for screening mammogram for malignant neoplasm of breast: Secondary | ICD-10-CM

## 2022-11-21 NOTE — Telephone Encounter (Signed)
Copied from [place] [id number]. Topic: Test Results - Chart Correction >> Nov 21, 2022  3:56 PM Erskine Squibb wrote: The patient called in requesting to get updated imaging because a previous imaging should a growth on her ovary. Please assist patient further.

## 2022-11-21 NOTE — Progress Notes (Signed)
Daily Group Progress Note   Program: CD IOP     Individual Time: 9 a.m. to 12 p.m.    Type of Therapy: Process and Psychoeducational    Topic: The therapist checks in with group members, assesses for SI/HI/psychosis and overall level of functioning. The therapist inquires about sobriety date and number of community support meetings attended since last session.   The therapist notes that group members who using drugs and alcohol to relax or motivate them to do things will need to learn time management skills and relaxation techniques in sobriety. The therapist educates group members on the [place] as a means of increasing motivation. He suggests that motivating one's self by focusing on the benefits of positive behavior change is more effective than focusing only on the negative outcomes of not making the change. He discusses the benefits of keeping a gratitude journal noting that this means focusing on not only the big things for which one is grateful but the little things as well.   He educates group members on what is meant by "catastrophizing" and the need to actively combat this if one is to break this habit. The therapist notes that group members should legitimately give themselves credit for being in treatment pointing that that most persons with addiction do not get treatment and many of those who do do not follow through.   The therapist observes that the group overall seems to have fallen into a rut overall in regard to energy and follow-through on treatment-related tasks.    Summary: Edelle presents rating her depression as a "5" and anxiety as an "7."  She went to a meeting on Friday. She says that her mood is "eager" and "nervous" as she returns to work this weekend saying that the "routine of it" makes her nervous. She called her Sponsor the last two days but did not call yesterday saying that if she does not say anything that her Sponsor will not either.   Tasheika says that  she still needs to talk to her Sponsor about working steps concluding that she is going to talk to her today as she is ready to do this.   Progress Towards Goals: Citlalic reports no change in her sobriety date   UDS collected: Yes Results: Yes, negative for drugs and alcohol   AA/NA attended?: Yes   Sponsor?: Yes   Adam Phenix, [place], [place], [facility], [place] 11/21/2022

## 2022-11-22 NOTE — Telephone Encounter (Signed)
Please have her schedule a visit to discuss this could be virtual as her last visit was in 02/2022 and she has just been calling to make several requests.

## 2022-11-22 NOTE — Telephone Encounter (Signed)
Patient had a CT scan done by her gastro doctor and they informed her that she has a growth on her ovaries, patient is wanting to follow up on the growth. She is requesting additional imaging.

## 2022-11-23 ENCOUNTER — Ambulatory Visit (HOSPITAL_COMMUNITY): Payer: Medicaid Other

## 2022-11-23 ENCOUNTER — Telehealth (HOSPITAL_COMMUNITY): Payer: Self-pay | Admitting: Licensed Clinical Social Worker

## 2022-11-23 ENCOUNTER — Telehealth: Payer: Self-pay | Admitting: Neurology

## 2022-11-23 NOTE — Telephone Encounter (Signed)
[name] leaves a voicemail saying that she will not be in group today due to not feeling well saying that she has body aches and that her head is pounding.  Adam Phenix, [place], LCSW, [facility], [place] 11/23/2022

## 2022-11-23 NOTE — Telephone Encounter (Signed)
Pt would like to switch back to Lyrica from Gabapentin. She is not longer in the rehab facility.  [facility] [address]

## 2022-11-23 NOTE — Telephone Encounter (Signed)
Patient called and scheduled for video visit.

## 2022-11-24 ENCOUNTER — Telehealth: Payer: Medicaid Other | Admitting: Family Medicine

## 2022-11-24 NOTE — Telephone Encounter (Signed)
I prescribed Lyrica '150mg'$  three times daily on 3/4. [facility] see if [facility] still [facility] on file and if so, go ahead and fill it for patient.  Stop gabapentin.

## 2022-11-24 NOTE — Telephone Encounter (Signed)
Called patient and unable to leave a message due to mailbox being full. Will attempt a [employer].

## 2022-11-24 NOTE — Telephone Encounter (Signed)
[facility] and [facility] have informed me that patient does have a current prescription on file for Lyrica with 5 refills. Will notify patient via [place].

## 2022-11-25 ENCOUNTER — Ambulatory Visit (INDEPENDENT_AMBULATORY_CARE_PROVIDER_SITE_OTHER): Payer: Medicaid Other | Admitting: Licensed Clinical Social Worker

## 2022-11-25 ENCOUNTER — Telehealth (HOSPITAL_COMMUNITY): Payer: Self-pay

## 2022-11-25 DIAGNOSIS — F1020 Alcohol dependence, uncomplicated: Secondary | ICD-10-CM | POA: Diagnosis not present

## 2022-11-25 DIAGNOSIS — F17200 Nicotine dependence, unspecified, uncomplicated: Secondary | ICD-10-CM

## 2022-11-25 DIAGNOSIS — F4312 Post-traumatic stress disorder, chronic: Secondary | ICD-10-CM

## 2022-11-25 DIAGNOSIS — F1220 Cannabis dependence, uncomplicated: Secondary | ICD-10-CM

## 2022-11-25 DIAGNOSIS — F1120 Opioid dependence, uncomplicated: Secondary | ICD-10-CM

## 2022-11-25 MED ORDER — TRAZODONE HCL 100 MG PO TABS: ORAL_TABLET | ORAL | 0 refills | Status: DC
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
## 2022-11-25 NOTE — Telephone Encounter (Signed)
Medication management - Attempted to contact patient, after speaking with Darlyne Russian, PA-C to discuss patient's status with requested new Trazodone order but no anwer and pt's message box was full so could not leave a message. Order for Trazodone 100 mg, 1 to1.5 at bedtime as needed for sleep, #[facility] [address] as new order requested by patient and authorized by Darlyne Russian, PA-C to be sent in for patient this date [facility]

## 2022-11-28 ENCOUNTER — Ambulatory Visit (INDEPENDENT_AMBULATORY_CARE_PROVIDER_SITE_OTHER): Payer: Medicaid Other | Admitting: Licensed Clinical Social Worker

## 2022-11-28 ENCOUNTER — Encounter: Payer: Self-pay | Admitting: Medical

## 2022-11-28 DIAGNOSIS — F4312 Post-traumatic stress disorder, chronic: Secondary | ICD-10-CM | POA: Diagnosis not present

## 2022-11-28 DIAGNOSIS — F17200 Nicotine dependence, unspecified, uncomplicated: Secondary | ICD-10-CM

## 2022-11-28 DIAGNOSIS — F1020 Alcohol dependence, uncomplicated: Secondary | ICD-10-CM

## 2022-11-28 DIAGNOSIS — F1220 Cannabis dependence, uncomplicated: Secondary | ICD-10-CM

## 2022-11-28 DIAGNOSIS — F1120 Opioid dependence, uncomplicated: Secondary | ICD-10-CM
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
## 2022-11-30 ENCOUNTER — Encounter (HOSPITAL_COMMUNITY): Payer: Self-pay | Admitting: Licensed Clinical Social Worker

## 2022-11-30 ENCOUNTER — Ambulatory Visit (INDEPENDENT_AMBULATORY_CARE_PROVIDER_SITE_OTHER): Payer: Medicaid Other | Admitting: Licensed Clinical Social Worker

## 2022-11-30 DIAGNOSIS — F4312 Post-traumatic stress disorder, chronic: Secondary | ICD-10-CM

## 2022-11-30 DIAGNOSIS — Z7189 Other specified counseling: Secondary | ICD-10-CM

## 2022-11-30 DIAGNOSIS — F1120 Opioid dependence, uncomplicated: Secondary | ICD-10-CM

## 2022-11-30 DIAGNOSIS — T7491XS Unspecified adult maltreatment, confirmed, sequela: Secondary | ICD-10-CM

## 2022-11-30 DIAGNOSIS — F17200 Nicotine dependence, unspecified, uncomplicated: Secondary | ICD-10-CM

## 2022-11-30 DIAGNOSIS — Z811 Family history of alcohol abuse and dependence: Secondary | ICD-10-CM

## 2022-11-30 DIAGNOSIS — K7030 Alcoholic cirrhosis of liver without ascites: Secondary | ICD-10-CM

## 2022-11-30 DIAGNOSIS — K7460 Unspecified cirrhosis of liver: Secondary | ICD-10-CM | POA: Diagnosis not present

## 2022-11-30 DIAGNOSIS — F4321 Adjustment disorder with depressed mood: Secondary | ICD-10-CM

## 2022-11-30 DIAGNOSIS — E878 Other disorders of electrolyte and fluid balance, not elsewhere classified: Secondary | ICD-10-CM | POA: Diagnosis not present

## 2022-11-30 DIAGNOSIS — I10 Essential (primary) hypertension: Secondary | ICD-10-CM | POA: Diagnosis not present

## 2022-11-30 DIAGNOSIS — F341 Dysthymic disorder: Secondary | ICD-10-CM

## 2022-11-30 DIAGNOSIS — G894 Chronic pain syndrome: Secondary | ICD-10-CM

## 2022-11-30 DIAGNOSIS — F1020 Alcohol dependence, uncomplicated: Secondary | ICD-10-CM

## 2022-11-30 DIAGNOSIS — T50905D Adverse effect of unspecified drugs, medicaments and biological substances, subsequent encounter: Secondary | ICD-10-CM

## 2022-11-30 DIAGNOSIS — Z8679 Personal history of other diseases of the circulatory system: Secondary | ICD-10-CM

## 2022-11-30 DIAGNOSIS — F419 Anxiety disorder, unspecified: Secondary | ICD-10-CM

## 2022-11-30 DIAGNOSIS — T7432XS Child psychological abuse, confirmed, sequela: Secondary | ICD-10-CM

## 2022-11-30 DIAGNOSIS — G621 Alcoholic polyneuropathy: Secondary | ICD-10-CM

## 2022-11-30 DIAGNOSIS — F1220 Cannabis dependence, uncomplicated: Secondary | ICD-10-CM

## 2022-11-30 LAB — LAB REPORT - SCANNED: EGFR (Non-African Amer.): 100
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
## 2022-11-30 NOTE — Progress Notes (Signed)
Daily Group Progress Note   Program: CD IOP     Individual Time: 9 a.m. to 12 p.m.    Type of Therapy: Process and Psychoeducational    Topic: The therapist checks in with group members, assesses for SI/HI/psychosis and overall level of functioning. The therapist inquires about sobriety date and number of community support meetings attended since last session.    The facilitates discussions on what is meant by "HALT" in Twelve Step meetings, what acceptance and commitment therapy is, and the fact that most people who obtain long-term recovery are not "one chip wonders." He continues to normalize having mood struggles and boredom in year one of recovery discussing how drug and alcohol depletes a person's dopamine levels such that they tend to only feel normal when using. He normalizes feeling guilty about one's recovery when one's loved ones do not recovery and remain actively in addiction while cautioning against engaging in the "rescue fantasy." He instills hope that after year one of recovery that life typically becomes so fulfilling that remaining abstinent is not so much of a struggle as the rewards of recovery are reinforcing.    Summary: [name] presents rating her depression as a "8" and anxiety as an "9."  She says that lately she is feeling more anxious and noting that she has a "short fuse." She says that in the beginning that she felt good and happy but is not sleeping well and feeling "empty." She is able to recognize that this is in part due to changing her anti-depressant and in part due to [place].  The therapist informs [name] that she has not been on the new anti-depressant long enough for it to be taking effect. [name] says that her boyfriend is no longer hiding his drinking asking her if she minds if he drinks.   She says that she needs to get a Sponsor but no women raised their hand at the last group but she is going to a meeting immediately after this group. [name] says that she is glad  to hear the feedback the therapist gives another group member about distancing herself from her substance-using boyfriend as [name] says that she had a similar issue in feeling guilty concerning a close friend with whom she used to drink.   The therapist observes that [name]'s sobriety seems to have had some impact on the friend who has mentioned going to a meeting with [name] with the therapist indicating that there would certainly be no harm in attending a meeting with her as opposed to going out to lunch with her.   Progress Towards Goals: [name] reports no change in her sobriety date   UDS collected: No Results: No   AA/NA attended?: Yes   Sponsor?: Yes   [name], [place], [place], [facility], [place] 11/30/2022

## 2022-12-02 ENCOUNTER — Encounter (HOSPITAL_COMMUNITY): Payer: Self-pay

## 2022-12-02 ENCOUNTER — Ambulatory Visit (HOSPITAL_COMMUNITY): Payer: Medicaid Other

## 2022-12-05 ENCOUNTER — Ambulatory Visit (INDEPENDENT_AMBULATORY_CARE_PROVIDER_SITE_OTHER): Payer: Medicaid Other | Admitting: Licensed Clinical Social Worker

## 2022-12-05 ENCOUNTER — Telehealth (HOSPITAL_COMMUNITY): Payer: Self-pay

## 2022-12-05 ENCOUNTER — Other Ambulatory Visit: Payer: Medicaid Other

## 2022-12-05 DIAGNOSIS — F4312 Post-traumatic stress disorder, chronic: Secondary | ICD-10-CM

## 2022-12-05 DIAGNOSIS — F17200 Nicotine dependence, unspecified, uncomplicated: Secondary | ICD-10-CM

## 2022-12-05 DIAGNOSIS — F1120 Opioid dependence, uncomplicated: Secondary | ICD-10-CM

## 2022-12-05 DIAGNOSIS — F1020 Alcohol dependence, uncomplicated: Secondary | ICD-10-CM

## 2022-12-05 DIAGNOSIS — F1220 Cannabis dependence, uncomplicated: Secondary | ICD-10-CM

## 2022-12-05 NOTE — Progress Notes (Signed)
Daily Group Progress Note   Program: CD IOP     Individual Time: 9 a.m. to 12 p.m.    Type of Therapy: Process and Psychoeducational    Topic: The therapist checks in with group members, assesses for SI/HI/psychosis and overall level of functioning. The therapist inquires about sobriety date and number of community support meetings attended since last session.    The therapist explains that roughly 70% of people currently in jail are there because of addiction and that the Criminal Justice system wants these people to abstain and get treatment knowing that if they were to get treatment that they would likely not see them again.  The therapist continues to present material on distress tolerance covering Distracting, Self-Soothing, and Improving the Moment techniques encouraging group members to consider which skills they already use effectively and which ones with which they struggle or do not find helpful. The therapist explains that a skill that works well for one person might have an adverse impact for another as people are different in regards to coping mechanisms.    Summary: Zaraiah presents rating her depression as a "5" and anxiety as an "8."  She reports feeling tired and content. She is content as she got to spend time with her son this weekend in [place] and be with an aunt whom she has not seen since she was a teenager. She also feels proud.  Ellyse talks about her long-term goal of becoming a Transport planner like her mother. She did not attend meetings while visiting her son but says that she is going to get back to meetings this week. She has still not gotten a permanent Sponsor saying that she did not see the two women she wanted to ask last week but plans on addressing it this week.   She is active in today's discuss on distress tolerance skills.  Progress Towards Goals: Debie reports no change in her sobriety date   UDS collected: Yes Results: No   AA/NA attended?:  No   Sponsor?: Yes (temporary)   Adam Phenix, [place], [place], [facility], [place] 12/05/2022

## 2022-12-05 NOTE — Telephone Encounter (Signed)
Medication management - Met with patient, in for [place] group today with Darlyne Russian, [facility] to assist patient with doing a bucal swab for genetic testing this date. Test completed and sent by [employer] for resulting this date. Order from Darlyne Russian, [facility] placed with [employer] and requested [employer] pick up.

## 2022-12-06 ENCOUNTER — Ambulatory Visit: Payer: Medicaid Other | Attending: Family Medicine

## 2022-12-06 ENCOUNTER — Other Ambulatory Visit (HOSPITAL_COMMUNITY): Payer: Self-pay | Admitting: Medical

## 2022-12-06 DIAGNOSIS — E782 Mixed hyperlipidemia: Secondary | ICD-10-CM

## 2022-12-06 DIAGNOSIS — R748 Abnormal levels of other serum enzymes: Secondary | ICD-10-CM

## 2022-12-07 ENCOUNTER — Ambulatory Visit (INDEPENDENT_AMBULATORY_CARE_PROVIDER_SITE_OTHER): Payer: Medicaid Other | Admitting: Licensed Clinical Social Worker

## 2022-12-07 DIAGNOSIS — F4312 Post-traumatic stress disorder, chronic: Secondary | ICD-10-CM

## 2022-12-07 DIAGNOSIS — F17200 Nicotine dependence, unspecified, uncomplicated: Secondary | ICD-10-CM

## 2022-12-07 DIAGNOSIS — F1020 Alcohol dependence, uncomplicated: Secondary | ICD-10-CM

## 2022-12-07 DIAGNOSIS — F1120 Opioid dependence, uncomplicated: Secondary | ICD-10-CM

## 2022-12-07 DIAGNOSIS — F1220 Cannabis dependence, uncomplicated: Secondary | ICD-10-CM

## 2022-12-07 LAB — COMPREHENSIVE METABOLIC PANEL
ALT: 23 IU/L (ref 0–32)
AST: 25 IU/L (ref 0–40)
Albumin/Globulin Ratio: 1.3 (ref 1.2–2.2)
Albumin: 4 g/dL (ref 3.8–4.9)
Alkaline Phosphatase: 203 IU/L — ABNORMAL HIGH (ref 44–121)
BUN/Creatinine Ratio: 13 (ref 9–23)
BUN: 8 mg/dL (ref 6–24)
Bilirubin Total: 0.3 mg/dL (ref 0.0–1.2)
CO2: 26 mmol/L (ref 20–29)
Calcium: 9.5 mg/dL (ref 8.7–10.2)
Chloride: 97 mmol/L (ref 96–106)
Creatinine, Ser: 0.64 mg/dL (ref 0.57–1.00)
Globulin, Total: 3 g/dL (ref 1.5–4.5)
Glucose: 86 mg/dL (ref 70–99)
Potassium: 3.9 mmol/L (ref 3.5–5.2)
Sodium: 139 mmol/L (ref 134–144)
Total Protein: 7 g/dL (ref 6.0–8.5)
eGFR: 107 mL/min/{1.73_m2} (ref >59)

## 2022-12-07 NOTE — Progress Notes (Signed)
Daily Group Progress Note   Program: CD IOP     Individual Time: 9 a.m. to 12 p.m.    Type of Therapy: Process and Psychoeducational    Topic: The therapist checks in with group members, assesses for SI/HI/psychosis and overall level of functioning. The therapist inquires about sobriety date and number of community support meetings attended since last session.    The therapist reiterates the benefits of exercise in reducing PAWS while encouraging getting back into activity slowly. He continues to present materials on distress tolerance covering the skill, radical acceptance.     Summary: [name] presents rating her depression as a "6" and anxiety as an "7."  She says that she feels more energetic and excited due to working lately and being more "prosperous" liking meaning more productive. She still has no permanent Sponsor saying that she is going to a women's meeting on Saturday to find one.  The therapist asks if she has had cravings with [name] admitting to cravings this past weekend saying that she had pain in her legs and that pain causes her to think of using. She admits to having found a half-joint but has yet to get rid of it. She eventually commits to getting rid of it with this making her realize even more that she needs a Publishing copy.   Progress Towards Goals: [name] reports no change in her sobriety date   UDS collected: No Results: No   AA/NA attended?: No   Sponsor?: Yes (temporary)   Adam Phenix, [place], LCSW, [facility], [place] 12/07/2022

## 2022-12-08 ENCOUNTER — Telehealth (HOSPITAL_COMMUNITY): Payer: Self-pay | Admitting: Licensed Clinical Social Worker

## 2022-12-08 NOTE — Telephone Encounter (Signed)
Note in error.

## 2022-12-08 NOTE — Telephone Encounter (Signed)
Recent D/C from [place] x1

## 2022-12-08 NOTE — Telephone Encounter (Signed)
The therapist returns [name]'s call confirming her identity via two identifiers. She says that she spoke to [facility] and found out that she was automatically going to be switched over to them beginning on 12/12/22. [employer] told her that this therapist would have to call [employer]'s Claims Department at [phone] concerning having her previous IOP visits covered retroactively.   The therapist thanks her for this information.  Adam Phenix, [place], LCSW, [facility], [place] 12/08/2022

## 2022-12-09 ENCOUNTER — Ambulatory Visit (INDEPENDENT_AMBULATORY_CARE_PROVIDER_SITE_OTHER): Payer: Medicaid Other | Admitting: Licensed Clinical Social Worker

## 2022-12-09 DIAGNOSIS — F4312 Post-traumatic stress disorder, chronic: Secondary | ICD-10-CM | POA: Diagnosis not present

## 2022-12-09 DIAGNOSIS — F17200 Nicotine dependence, unspecified, uncomplicated: Secondary | ICD-10-CM

## 2022-12-09 DIAGNOSIS — F1020 Alcohol dependence, uncomplicated: Secondary | ICD-10-CM | POA: Diagnosis not present

## 2022-12-09 DIAGNOSIS — F1220 Cannabis dependence, uncomplicated: Secondary | ICD-10-CM

## 2022-12-09 DIAGNOSIS — F1120 Opioid dependence, uncomplicated: Secondary | ICD-10-CM

## 2022-12-09 NOTE — Progress Notes (Signed)
Daily Group Progress Note   Program: CD IOP     Individual Time: 9 a.m. to 12 p.m.    Type of Therapy: Process and Psychoeducational    Topic: The therapist checks in with group members, assesses for SI/HI/psychosis and overall level of functioning. The therapist inquires about sobriety date and number of community support meetings attended since last session.    The therapist shares a chart with a modified Jellinek Curve using this to illustrate how addiction is progressive disease but one that can put put indefinitely into remission with the cessation of drug and/or alcohol use. He answers group members' questions about the "rat brain" providing information on the biology of addiction. He explains the differences between helping and enabling and what is meant by "tough love" and emphasizes the importance of family involvement in substance abuse treatment while explaining what Alanon is. He normalizes people in early recovery being impatient and wanting everything to happen now and suggests how using the mantras, "easy does it" or "[place] wasn't built in a day" can help people remember the need for patience and to focus on the here-and-now.    Summary: [name] presents rating her depression as a "5" and anxiety as an "6."  She reports feeling "excited" and "content." She is excited as her daughter's dog had puppies. She says that she got rid of the half joint by flushing it and that she plans on attending a meeting this weekend to get a permanent Sponsor.   She is active in today's group discussions and leaves group getting a rid with another member to go directly to an [place] meeting.   Progress Towards Goals: [name] reports no change in her sobriety date   UDS collected: No Results: Yes, negative for drugs and alcohol   AA/NA attended?: Yes   Sponsor?: Yes (temporary)   Adam Phenix, [place], LCSW, [facility], [place] 12/09/2022

## 2022-12-12 ENCOUNTER — Ambulatory Visit (INDEPENDENT_AMBULATORY_CARE_PROVIDER_SITE_OTHER): Payer: Medicaid Other | Admitting: Licensed Clinical Social Worker

## 2022-12-12 DIAGNOSIS — F1020 Alcohol dependence, uncomplicated: Secondary | ICD-10-CM | POA: Diagnosis not present

## 2022-12-12 DIAGNOSIS — F4312 Post-traumatic stress disorder, chronic: Secondary | ICD-10-CM | POA: Diagnosis not present

## 2022-12-12 DIAGNOSIS — F1120 Opioid dependence, uncomplicated: Secondary | ICD-10-CM

## 2022-12-12 DIAGNOSIS — F1220 Cannabis dependence, uncomplicated: Secondary | ICD-10-CM

## 2022-12-12 DIAGNOSIS — F17200 Nicotine dependence, unspecified, uncomplicated: Secondary | ICD-10-CM

## 2022-12-12 NOTE — Progress Notes (Signed)
Daily Group Progress Note   Program: CD IOP     Individual Time: 9 a.m. to 12 p.m.    Type of Therapy: Process and Psychoeducational    Topic: The therapist checks in with group members, assesses for SI/HI/psychosis and overall level of functioning. The therapist inquires about sobriety date and number of community support meetings attended since last session.    The therapist explains the difference between open versus closed Twelve Step meetings and how it can be beneficial to have friends and loved ones attend open meetings and how they are to introduce themselves if they do. The therapist explains what a resentment chip is and its purpose. He explains what process addictions are and the reason these activities should be approached with caution by people in recovery for drugs and alcohol. He informs members about the website Meetup.com to find social groups.  The therapist covers the Matrix Model topic on the important of scheduling noting that group members can now use programs such as the recovery evaluator which one of the group members talks about today.    Summary: [name] presents rating her depression as a "4" and anxiety as an "7."  She has been to one meeting over the weekend and is "thankful" and "tried" but then goes on to say that she now has more energy since she has stopped drinking.   [name] says that she is thinking about what she wants to do with this energy noting that needs to find her fishing pole as she has not gone fishing in a while. She says that she previously could not follow a schedule due to drinking but now can.  She still has no Publishing copy, but another member in group makes [name] aware that one of the women she may want to ask, "[name]," is taking Sponsees so [name] hopes to see her at a meeting today to ask her.   Progress Towards Goals: [name] reports no change in her sobriety date   UDS collected: Yes Results: No   AA/NA attended?: Yes   Sponsor?: Yes (temporary)    [name], [place], LCSW, [facility], [place] 12/12/2022

## 2022-12-14 ENCOUNTER — Ambulatory Visit (INDEPENDENT_AMBULATORY_CARE_PROVIDER_SITE_OTHER): Payer: Medicaid Other | Admitting: Licensed Clinical Social Worker

## 2022-12-14 DIAGNOSIS — F4312 Post-traumatic stress disorder, chronic: Secondary | ICD-10-CM

## 2022-12-14 DIAGNOSIS — F1120 Opioid dependence, uncomplicated: Secondary | ICD-10-CM

## 2022-12-14 DIAGNOSIS — F1020 Alcohol dependence, uncomplicated: Secondary | ICD-10-CM | POA: Diagnosis not present

## 2022-12-14 DIAGNOSIS — F1220 Cannabis dependence, uncomplicated: Secondary | ICD-10-CM

## 2022-12-14 DIAGNOSIS — F17200 Nicotine dependence, unspecified, uncomplicated: Secondary | ICD-10-CM

## 2022-12-14 NOTE — Progress Notes (Signed)
Daily Group Progress Note   Program: CD IOP     Individual Time: 9 a.m. to 12 p.m.    Type of Therapy: Process and Psychoeducational    Topic: The therapist checks in with group members, assesses for SI/HI/psychosis and overall level of functioning. The therapist inquires about sobriety date and number of community support meetings attended since last session.    The therapist facilitates a discussion on spirituality asking group members to consider how they will be content with what they have when reaching some goal in the future if not content with what they already have now. The therapist answers group member's questions about psychotropic medications and anti-craving medications.     Summary: [name] presents rating her depression as a "4" and anxiety as an "5."  She says that she called a couple of the "ladies" in [place] but has not decided which she wants to ask to be her Sponsor. When the therapist notes her procrastination, she admits that it may be due to feeling "skeptical" after her negative experience with her Temporary Sponsor. The therapist notes a few instances of [name]'s catastrophizing about things such as assuming that when she gets a Sponsor that the Sponsor will advise her to move out from the boyfriend adding that it is "easier said than done."   With the assistance of group, she indicates that she will ask "[name]" to be her Sponsor. She admits to having had a craving recently noting that the Naltrexone is not helping again catrastrophizing about nothing likely being able to be done about this until the therapist advises her to speak with the P.A. about something like Baclofen on Friday. He also explains that the P.A. will go over her GenSight testing which [name] says she cannot understand.   [name] says that she feels "excited" and "energetic" noting that not only does she have more energy but she recently was on her feet for a long time and active and they did not swell as they have  been doing previously. Her PHQ-9 is an "8" today and GAD-7 is a "12."    Progress Towards Goals: [name] reports no change in her sobriety date   UDS collected: No Results: No   AA/NA attended?: Yes   Sponsor?: Yes (temporary)   [name], [place], [place], [place], [place] 12/14/2022

## 2022-12-16 ENCOUNTER — Other Ambulatory Visit (HOSPITAL_COMMUNITY): Payer: Self-pay | Admitting: Medical

## 2022-12-16 ENCOUNTER — Ambulatory Visit (INDEPENDENT_AMBULATORY_CARE_PROVIDER_SITE_OTHER): Payer: Medicaid Other | Admitting: Medical

## 2022-12-16 DIAGNOSIS — T7432XS Child psychological abuse, confirmed, sequela: Secondary | ICD-10-CM

## 2022-12-16 DIAGNOSIS — F32A Anxiety disorder, unspecified: Secondary | ICD-10-CM

## 2022-12-16 DIAGNOSIS — F341 Dysthymic disorder: Secondary | ICD-10-CM

## 2022-12-16 DIAGNOSIS — Z7189 Other specified counseling: Secondary | ICD-10-CM

## 2022-12-16 DIAGNOSIS — F419 Anxiety disorder, unspecified: Secondary | ICD-10-CM

## 2022-12-16 DIAGNOSIS — F4321 Adjustment disorder with depressed mood: Secondary | ICD-10-CM

## 2022-12-16 DIAGNOSIS — F4312 Post-traumatic stress disorder, chronic: Secondary | ICD-10-CM

## 2022-12-16 DIAGNOSIS — F1020 Alcohol dependence, uncomplicated: Secondary | ICD-10-CM

## 2022-12-16 DIAGNOSIS — Z8679 Personal history of other diseases of the circulatory system: Secondary | ICD-10-CM

## 2022-12-16 DIAGNOSIS — T50905D Adverse effect of unspecified drugs, medicaments and biological substances, subsequent encounter: Secondary | ICD-10-CM

## 2022-12-16 DIAGNOSIS — G621 Alcoholic polyneuropathy: Secondary | ICD-10-CM

## 2022-12-16 DIAGNOSIS — K7030 Alcoholic cirrhosis of liver without ascites: Secondary | ICD-10-CM

## 2022-12-16 DIAGNOSIS — T7491XS Unspecified adult maltreatment, confirmed, sequela: Secondary | ICD-10-CM

## 2022-12-16 DIAGNOSIS — F17200 Nicotine dependence, unspecified, uncomplicated: Secondary | ICD-10-CM

## 2022-12-16 DIAGNOSIS — G894 Chronic pain syndrome: Secondary | ICD-10-CM

## 2022-12-16 DIAGNOSIS — F1220 Cannabis dependence, uncomplicated: Secondary | ICD-10-CM

## 2022-12-16 DIAGNOSIS — F1120 Opioid dependence, uncomplicated: Secondary | ICD-10-CM

## 2022-12-16 DIAGNOSIS — Z811 Family history of alcohol abuse and dependence: Secondary | ICD-10-CM

## 2022-12-16 MED ORDER — BACLOFEN 10 MG PO TABS: 10.0000 mg | ORAL_TABLET | Freq: Three times a day (TID) | ORAL | 0 refills | Status: DC

## 2022-12-16 MED ORDER — BUPROPION HCL ER (SR) 200 MG PO TB12: 200.0000 mg | ORAL_TABLET | Freq: Two times a day (BID) | ORAL | 2 refills | Status: DC

## 2022-12-16 MED ORDER — BUSPIRONE HCL 30 MG PO TABS: 30.0000 mg | ORAL_TABLET | Freq: Two times a day (BID) | ORAL | 2 refills | Status: AC
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
## 2022-12-16 NOTE — Progress Notes (Signed)
Daily Group Progress Note   Program: CD IOP     Individual Time: 9:30 a.m. to 12 p.m.    Type of Therapy: Process and Psychoeducational    Topic: The therapist checks in with group members, assesses for SI/HI/psychosis and overall level of functioning. The therapist inquires about sobriety date and number of community support meetings attended since last session.   The therapist has group members complete and discuss Matrix Modules ERS1A through ERS3B. The therapist emphasizes the importance of talking to others when one is in "emotional relapse" and not just talking to someone when thinking about drinking or using.    Summary: Treasa presents rating her depression as a "5" and anxiety as an "9."  She is late to group as she had to wait to let a plumber into her house. In completing the Matrix exercises, she notes that she would not use around her mother. She also says that her use prior to entering treatment was more routine or automatic than it was a result of negative emotions.   Bradford meets with the P.A. today says that he changed her Wellbutrin to SR and put  her on Baclofen to help with cravings. Charlot's worksheets indicate that everyone with whom she works drinks which is a potential trigger as is living with her boyfriend who drinks.   She could not find the number for the woman who she wanted to ask to be her Sponsor but plans on talking to her this weekend.    Progress Towards Goals: Neal reports no change in her sobriety date   UDS collected: No Results: Yes, negative for drugs and alcohol   AA/NA attended?: Yes   Sponsor?: Yes (temporary)   Myrna Blazer, MA, LCSW, [facility], LCAS 12/16/2022

## 2022-12-19 ENCOUNTER — Telehealth: Payer: Self-pay

## 2022-12-19 ENCOUNTER — Other Ambulatory Visit: Payer: Self-pay | Admitting: Family Medicine

## 2022-12-19 ENCOUNTER — Ambulatory Visit
Admission: RE | Admit: 2022-12-19 | Discharge: 2022-12-19 | Disposition: A | Discharge status: Home / Self Care | Payer: Medicaid Other | Source: Ambulatory Visit | Attending: Family Medicine | Admitting: Family Medicine

## 2022-12-19 ENCOUNTER — Ambulatory Visit (INDEPENDENT_AMBULATORY_CARE_PROVIDER_SITE_OTHER): Payer: Medicaid Other | Admitting: Licensed Clinical Social Worker

## 2022-12-19 DIAGNOSIS — F4312 Post-traumatic stress disorder, chronic: Secondary | ICD-10-CM | POA: Diagnosis not present

## 2022-12-19 DIAGNOSIS — N9489 Other specified conditions associated with female genital organs and menstrual cycle: Secondary | ICD-10-CM

## 2022-12-19 DIAGNOSIS — F17200 Nicotine dependence, unspecified, uncomplicated: Secondary | ICD-10-CM

## 2022-12-19 DIAGNOSIS — F1020 Alcohol dependence, uncomplicated: Secondary | ICD-10-CM

## 2022-12-19 DIAGNOSIS — F1120 Opioid dependence, uncomplicated: Secondary | ICD-10-CM

## 2022-12-19 DIAGNOSIS — F1220 Cannabis dependence, uncomplicated: Secondary | ICD-10-CM

## 2022-12-19 MED ORDER — GADOPICLENOL 0.5 MMOL/ML IV SOLN
6.0000 mL | Freq: Once | INTRAVENOUS | Status: AC | PRN
  Administered 2022-12-19: 6 mL via INTRAVENOUS

## 2022-12-19 NOTE — Telephone Encounter (Signed)
Patient has been informed of results and GYN oncology referral placed.

## 2022-12-19 NOTE — Progress Notes (Signed)
Daily Group Progress Note   Program: CD IOP     Individual Time: 10 a.m. to 12 p.m.    Type of Therapy: Process and Psychoeducational    Topic: The therapist checks in with group members, assesses for SI/HI/psychosis and overall level of functioning. The therapist inquires about sobriety date and number of community support meetings attended since last session.   The therapist provides handouts to group members with a list of games from Transactional Analysis relating this to a discussion on how to deal with family members and loved ones who keep throwing up the past. The therapist discusses productive ways that family and loved ones can deal with their feelings of hurt and anger from the loved one's actions while using substances such as attending [employer], seeing a therapist, attending family sessions via CD IOP, etcetera.    Summary: [name] presents late for group not completing a group check-in. She is late as she had to get an MRI this morning. She talks about problems she has picking up her scripts as she is now enrolled with [facility] but does not have a card yet. The therapist recommends that she call [employer] today to see if she can get a temporary card such that she can pick up her medications.   [name] is for the most part quiet but attentive today and group runs out before [name] can complete the check-in process. She makes no mention of whether she has finally obtained a permanent Sponsor such that she can start working steps. At the conclusion of group, she is the only group member to have not provided a sample for her UDS saying that she cannot go so questions if she must wait as she has to leave for a meeting directly after group with the therapist informing her that she can complete the UDS at next group.   Progress Towards Goals: [name] reports no change in her sobriety date   UDS collected: No Results: No   AA/NA attended?: Yes   Sponsor?: Yes (temporary)   Myrna Blazer, MA,  LCSW, [facility], LCAS 12/19/2022

## 2022-12-19 NOTE — Telephone Encounter (Signed)
Call from Diane at [facility] radiology.  She wanted make sure provider had seen report.  IMPRESSION: 1. 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 This mass has grown slowly on several CT studies back to 09/03/2021. Low-grade cystic malignancy versus borderline neoplasm of the right ovary cannot be excluded. GYN ONC consultation suggested. 2. Normal uterus. 3. No pelvic adenopathy.   These results will be called to the ordering clinician or representative by the Radiologist Assistant, and communication documented in the PACS or [employer].     Electronically Signed   By: Delbert Phenix M.D.   On: 12/19/2022 15:47

## 2022-12-20 NOTE — Telephone Encounter (Signed)
Noted  

## 2022-12-21 ENCOUNTER — Ambulatory Visit (INDEPENDENT_AMBULATORY_CARE_PROVIDER_SITE_OTHER): Payer: Medicaid Other | Admitting: Licensed Clinical Social Worker

## 2022-12-21 ENCOUNTER — Encounter (HOSPITAL_COMMUNITY): Payer: Self-pay | Admitting: Licensed Clinical Social Worker

## 2022-12-21 ENCOUNTER — Telehealth: Payer: Self-pay | Admitting: *Deleted

## 2022-12-21 DIAGNOSIS — F1220 Cannabis dependence, uncomplicated: Secondary | ICD-10-CM

## 2022-12-21 DIAGNOSIS — Z8679 Personal history of other diseases of the circulatory system: Secondary | ICD-10-CM

## 2022-12-21 DIAGNOSIS — F341 Dysthymic disorder: Secondary | ICD-10-CM

## 2022-12-21 DIAGNOSIS — F419 Anxiety disorder, unspecified: Secondary | ICD-10-CM

## 2022-12-21 DIAGNOSIS — F1120 Opioid dependence, uncomplicated: Secondary | ICD-10-CM

## 2022-12-21 DIAGNOSIS — F4321 Adjustment disorder with depressed mood: Secondary | ICD-10-CM

## 2022-12-21 DIAGNOSIS — F17200 Nicotine dependence, unspecified, uncomplicated: Secondary | ICD-10-CM

## 2022-12-21 DIAGNOSIS — G894 Chronic pain syndrome: Secondary | ICD-10-CM

## 2022-12-21 DIAGNOSIS — F1020 Alcohol dependence, uncomplicated: Secondary | ICD-10-CM | POA: Diagnosis not present

## 2022-12-21 DIAGNOSIS — T7491XS Unspecified adult maltreatment, confirmed, sequela: Secondary | ICD-10-CM

## 2022-12-21 DIAGNOSIS — F4312 Post-traumatic stress disorder, chronic: Secondary | ICD-10-CM

## 2022-12-21 DIAGNOSIS — K7030 Alcoholic cirrhosis of liver without ascites: Secondary | ICD-10-CM

## 2022-12-21 DIAGNOSIS — T7432XS Child psychological abuse, confirmed, sequela: Secondary | ICD-10-CM

## 2022-12-21 DIAGNOSIS — G621 Alcoholic polyneuropathy: Secondary | ICD-10-CM

## 2022-12-21 DIAGNOSIS — N838 Other noninflammatory disorders of ovary, fallopian tube and broad ligament: Secondary | ICD-10-CM

## 2022-12-21 DIAGNOSIS — Z811 Family history of alcohol abuse and dependence: Secondary | ICD-10-CM

## 2022-12-21 NOTE — Telephone Encounter (Signed)
Spoke with [name] regarding her referral to GYN oncology. She has an appointment scheduled with Dr. Alvester Morin on 4/29 at 11:45. Patient agrees to date and time. She has been provided with office address and location. She is also aware of our mask and visitor policy. Patient verbalized understanding and will call with any questions.

## 2022-12-21 NOTE — Progress Notes (Signed)
Patient ID: [name], female   DOB: 04/05/1971, 52 y.o.   MRN: [id number]  S- Noticed patient had MRI Monday. She says she was ordered same due to rt ovary swelling.She did not open result in My Chart out of fear but asks what was reported. Shared below with her.She is aware she has an Oncolgy consult  O- Call from Diane at [facility] radiology.  She wanted make sure provider had seen report.   IMPRESSION: 1. 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 This mass has grown slowly on several CT studies back to 09/03/2021. Low-grade cystic malignancy versus borderline neoplasm of the right ovary cannot be excluded. GYN ONC consultation suggested. 2. Normal uterus. 3. No pelvic adenopathy.   These results will be called to the ordering clinician or representative by the Radiologist Assistant, and communication documented in the PACS or [employer].     Electronically Signed   By: Delbert Phenix M.D.   On: 12/19/2022 15:47     A- Rt Ovarian cystic mass P-per PCP Oncology consult pending. Assured patient in lay terms the MRI appeared to favor good outcome but final prognosis will probably require removing the Ovary for pathological examination? Oncology will help her with all this.Agree with her efforts to not worry "too much" to stay clean and sober

## 2022-12-21 NOTE — Telephone Encounter (Signed)
LMOM for the patient to call the office back to schedule a new patient appt. Patient can be scheduled on 4/29, 5/6 or 5/9

## 2022-12-21 NOTE — Progress Notes (Signed)
Daily Group Progress Note   Program: CD IOP     Individual Time: 9 a.m. to 12 p.m.    Type of Therapy: Process and Psychoeducational    Topic: The therapist checks in with group members, assesses for SI/HI/psychosis and overall level of functioning. The therapist inquires about sobriety date and number of community support meetings attended since last session.    The therapist facilitates discussions on the importance of honesty and assertiveness in recovery in addition to setting healthy boundaries and practicing self-compassion. He explains the [employer] of parent, adult, and child and how to use this model to analysis interpersonal interactions and how to work on developing adult to adult transactions. The therapist explains what is meant by the Twelve Step saying, "Don't compare your insides to other people's outsides."    Summary: [name] presents rating her depression as a "4" and her anxiety as an "8."  She says that she is feeling "worried" and "overwhelmed" as the MRI that she took recently showed that a growth on her ovary had grown significantly. She was supposed to get this checked out a year ago but did not due to drinking. She returns a call from her doctor's office today during break to schedule her follow-up.   [name] has still gotten no Sponsor saying that none of the women at the meeting are raising their hands. At the same time, she admits that she is "past due" in getting a permanent Sponsor as her Temporary Sponsor is doing nothing for her. The therapist suggests ways that [name] could get a Marketing executive such as mentioning that she needs a Marketing executive as a burning desire during the meeting or approaching the woman she was going to ask to see if she has recommendations if she is not taking Sponsees currently.    Progress Towards Goals: [name] reports no change in her sobriety date   UDS collected: Yes Results: No   AA/NA attended?: Yes   Sponsor?: No   Myrna Blazer,  MA, LCSW, [facility], LCAS 12/21/2022

## 2022-12-23 ENCOUNTER — Ambulatory Visit (INDEPENDENT_AMBULATORY_CARE_PROVIDER_SITE_OTHER): Payer: Medicaid Other | Admitting: Licensed Clinical Social Worker

## 2022-12-23 DIAGNOSIS — F1020 Alcohol dependence, uncomplicated: Secondary | ICD-10-CM | POA: Diagnosis not present

## 2022-12-23 DIAGNOSIS — F17200 Nicotine dependence, unspecified, uncomplicated: Secondary | ICD-10-CM

## 2022-12-23 DIAGNOSIS — F4312 Post-traumatic stress disorder, chronic: Secondary | ICD-10-CM | POA: Diagnosis not present

## 2022-12-23 DIAGNOSIS — F1120 Opioid dependence, uncomplicated: Secondary | ICD-10-CM

## 2022-12-23 DIAGNOSIS — F1220 Cannabis dependence, uncomplicated: Secondary | ICD-10-CM

## 2022-12-23 NOTE — Progress Notes (Signed)
Daily Group Progress Note   Program: CD IOP     Individual Time: 9 a.m. to 12 p.m.    Type of Therapy: Process and Psychoeducational    Topic: The therapist checks in with group members, assesses for SI/HI/psychosis and overall level of functioning. The therapist inquires about sobriety date and number of community support meetings attended since last session.    The therapist introduces a new group member. The therapist presents information on and facilitates discussions concerning the following topics: how to cope with grief effectively and various types of bereavement support, what ASAM placement criteria are and how they are used to determine the appropriate level of care, what [facility] are and how to access them, what is meant by the term, "harm reduction," what is meant by the Twelve Step phrase about not letting a person "rent space" in one's head, what is meant by a "monitor" and a "blunter" and how they cope with stress differently, and the fact that a person in recovery will likely always have thoughts about using when under stress; however, the person does not have to act on these thoughts. The therapist again reviews the various anti-craving medications for alcohol and how they work and what they do.    Summary: Dama presents rating her depression as a "4" and her anxiety as an "5."  She says that she is "proud" that she is "not looking to drink" noting that she recently received a check pertaining to her father which brought up a lot of grief as there were things that she did not get to say to him. The therapist talks about Betzabeth's writing a letter to her father; however, recommends having support when she does this perhaps in the form of attending bereavement support.   Jayona says that drinking is often the first thing that pops into her head. She talks about feeling worried about the growth on her ovary saying that her appointment with the OBGYN was scheduled and will be on  01/09/23. Britlee says that she started Baclofen on Monday so it has perhaps not had enough time to work.  She talks about her living situation with her boyfriend who drinks as being a barrier to her efforts to be smart versus strong. Thus, the therapist educates her about [facility] and another woman in group makes Glennys aware of the [employer] as another option to try and find a sober living situation.  Atalie still has no Marketing executive but agrees to announce that she needs one via a burning desire at an [employer] today as another group member who also does not have a Sponsor agrees to do the same if Wileen will.   Progress Towards Goals: Malakai reports no change in her sobriety date   UDS collected: No Results: No   AA/NA attended?: Yes   Sponsor?: No   Myrna Blazer, MA, LCSW, [facility], LCAS 12/23/2022

## 2022-12-26 ENCOUNTER — Ambulatory Visit (INDEPENDENT_AMBULATORY_CARE_PROVIDER_SITE_OTHER): Payer: Medicaid Other | Admitting: Licensed Clinical Social Worker

## 2022-12-26 DIAGNOSIS — F17200 Nicotine dependence, unspecified, uncomplicated: Secondary | ICD-10-CM

## 2022-12-26 DIAGNOSIS — F1020 Alcohol dependence, uncomplicated: Secondary | ICD-10-CM

## 2022-12-26 DIAGNOSIS — F1220 Cannabis dependence, uncomplicated: Secondary | ICD-10-CM

## 2022-12-26 DIAGNOSIS — F4312 Post-traumatic stress disorder, chronic: Secondary | ICD-10-CM

## 2022-12-26 DIAGNOSIS — F1120 Opioid dependence, uncomplicated: Secondary | ICD-10-CM
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
## 2022-12-28 ENCOUNTER — Ambulatory Visit (INDEPENDENT_AMBULATORY_CARE_PROVIDER_SITE_OTHER): Payer: Medicaid Other | Admitting: Licensed Clinical Social Worker

## 2022-12-28 DIAGNOSIS — F1220 Cannabis dependence, uncomplicated: Secondary | ICD-10-CM

## 2022-12-28 DIAGNOSIS — F4312 Post-traumatic stress disorder, chronic: Secondary | ICD-10-CM | POA: Diagnosis not present

## 2022-12-28 DIAGNOSIS — F1120 Opioid dependence, uncomplicated: Secondary | ICD-10-CM

## 2022-12-28 DIAGNOSIS — F1020 Alcohol dependence, uncomplicated: Secondary | ICD-10-CM

## 2022-12-28 DIAGNOSIS — F17200 Nicotine dependence, unspecified, uncomplicated: Secondary | ICD-10-CM

## 2022-12-28 NOTE — Progress Notes (Signed)
Daily Group Progress Note   Program: CD IOP     Individual Time: 9 a.m. to 12 p.m.    Type of Therapy: Process and Psychoeducational    Topic: The therapist checks in with group members, assesses for SI/HI/psychosis and overall level of functioning. The therapist inquires about sobriety date and number of community support meetings attended since last session.   The therapist spends a great deal of time focusing on how to worry less about what others think about you and how to assert one's self with people who gossip and try to pass on things that others are supposedly saying about you. The therapist encourages people to avoid such individuals while explaining how to identify emotionally healthier people with whom they can cultivate relationships. The therapist talks about the importance of "connecting rituals" in dating and marital relationships. He explains how bibliotherapy can help individuals to start overcoming histories of trauma, grief, etcetera by exposing them to other people's stories much as the Big Book shares other alcoholics' stories. He encourages group members to branch out and trying different meetings explaining the benefits of doing so.       Summary: [name] presents rating her depression as a "5" and her anxiety as an "8."  She reports feeling "tired" and "sleepy" and notes that she does not have a Sponsor but feels pressured to do so which she does not want to feel. The therapist observes that she does appear stuck in relation to not being able to obtain one.  In discussing this, it becomes apparent that [name] has not been able to find one; however, she is always attending the same meeting having never attended a different one. Thus, the therapist encourages her to put finding a Sponsor on hold for the time being and to start focusing on attending different meetings so see what they are like.  [name] says that she likes this idea and says that she has a week off coming up so  plans on doing this.   Progress Towards Goals: [name] reports no change in her sobriety date   UDS collected: No Results: No   AA/NA attended?: Yes   Sponsor?: No   Myrna Blazer, MA, LCSW, [facility], LCAS 12/28/2022

## 2022-12-30 ENCOUNTER — Ambulatory Visit (INDEPENDENT_AMBULATORY_CARE_PROVIDER_SITE_OTHER): Payer: Medicaid Other | Admitting: Licensed Clinical Social Worker

## 2022-12-30 DIAGNOSIS — F1220 Cannabis dependence, uncomplicated: Secondary | ICD-10-CM

## 2022-12-30 DIAGNOSIS — F1120 Opioid dependence, uncomplicated: Secondary | ICD-10-CM

## 2022-12-30 DIAGNOSIS — F1020 Alcohol dependence, uncomplicated: Secondary | ICD-10-CM | POA: Diagnosis not present

## 2022-12-30 DIAGNOSIS — F4312 Post-traumatic stress disorder, chronic: Secondary | ICD-10-CM

## 2022-12-30 DIAGNOSIS — F17200 Nicotine dependence, unspecified, uncomplicated: Secondary | ICD-10-CM

## 2022-12-30 NOTE — Progress Notes (Signed)
Daily Group Progress Note   Program: CD IOP     Individual Time: 9 a.m. to 12 p.m.    Type of Therapy: Process and Psychoeducational    Topic: The therapist checks in with group members, assesses for SI/HI/psychosis and overall level of functioning. The therapist inquires about sobriety date and number of community support meetings attended since last session.   The therapist facilitates a discussion on what people believe is the difference between a "problem drinker" and an "alcoholic." The therapist explains that these terms are not clinical terms educating them on the DSM-V and the criteria for diagnosing a Substance Use Disorder. The therapist illustrates how a Substance Use Disorder is a chronic medical condition. He answers group members' questions informing them that there is one ounce of ethyl alcohol in a standard drink such that twelve beers is as harmful as twelve shots of liquor. He explains that unlike opiates that alcohol causes tissue damage. He educates group members on the fact that some, but not all substances, have potentially fatal withdrawal syndromes and the reason that the preferred treatment for persons with opioid dependence is MAT rather than detox.      Summary: [name] presents rating her depression as a "7" and her anxiety as an "8."  She admits that her sobriety date is not the same and that she is "embarrassed." She apologizes to the group for not being honest previously. She drank on Saturday thinking she could drink "a" beer only to go on and drink several beers. She says that she is no longer taking the Baclofen as it gave her a headache which stopped when she stopped taking it. She admits that she gives the outward appearance of doing well but is not doing well. She says that she perhaps has been trying to convince herself of this.   [name] receives a great deal of support and encouragement from the group. The therapist informs [name] that he has encouraged her to get a  Sponsor as it would benefit her greatly to have someone with whom she could talk and check-in with on a daily basis.   Progress Towards Goals: [name] reports having relapsed last [place].    UDS collected: No Results: Yes, positive for alcohol   AA/NA attended?: Yes   Sponsor?: No   Myrna Blazer, MA, LCSW, [facility], LCAS 12/30/2022

## 2023-01-02 ENCOUNTER — Ambulatory Visit (INDEPENDENT_AMBULATORY_CARE_PROVIDER_SITE_OTHER): Payer: Medicaid Other | Admitting: Licensed Clinical Social Worker

## 2023-01-02 DIAGNOSIS — F1220 Cannabis dependence, uncomplicated: Secondary | ICD-10-CM

## 2023-01-02 DIAGNOSIS — F1020 Alcohol dependence, uncomplicated: Secondary | ICD-10-CM | POA: Diagnosis not present

## 2023-01-02 DIAGNOSIS — F4312 Post-traumatic stress disorder, chronic: Secondary | ICD-10-CM | POA: Diagnosis not present

## 2023-01-02 DIAGNOSIS — F17200 Nicotine dependence, unspecified, uncomplicated: Secondary | ICD-10-CM

## 2023-01-02 DIAGNOSIS — F1120 Opioid dependence, uncomplicated: Secondary | ICD-10-CM

## 2023-01-02 NOTE — Progress Notes (Signed)
Daily Group Progress Note   Program: CD IOP     Individual Time: 9 a.m. to 12 p.m.    Type of Therapy: Process and Psychoeducational    Topic: The therapist checks in with group members, assesses for SI/HI/psychosis and overall level of functioning. The therapist inquires about sobriety date and number of community support meetings attended since last session.   The therapist focuses primarily on the concept of addiction as a biological disease with inheritable components versus addiction being a result of external factors outside oneself.      Summary: Zennie presents rating her depression as a "7" and her anxiety as an "8."  She says that she is sleepy and very tired and that her body hurts from working. She has attended no meetings but says that she is off all this week so plans on attending different ones.   She says that she feels "thankful" and reports having the same sobriety date; however, the therapist notes that she put that she took half an "oxycodone" on her check-in sheet.   The therapist talks to Odie about needing to setup a one-on-one meeting with her with TRUE saying that she is agreeable to this; however, she leaves group today before this can get schedule.   Progress Towards Goals: Timi reports no alcohol use but took half an oxycodone.    UDS collected: Yes Results: No   AA/NA attended?: No   Sponsor?: No   Myrna Blazer, MA, LCSW, [facility], LCAS 01/02/2023

## 2023-01-03 ENCOUNTER — Ambulatory Visit: Payer: Medicaid Other

## 2023-01-04 ENCOUNTER — Ambulatory Visit
Admission: RE | Admit: 2023-01-04 | Discharge: 2023-01-04 | Disposition: A | Discharge status: Home / Self Care | Payer: Medicaid Other | Source: Ambulatory Visit | Attending: Family Medicine | Admitting: Family Medicine

## 2023-01-04 ENCOUNTER — Telehealth (HOSPITAL_COMMUNITY): Payer: Self-pay | Admitting: Licensed Clinical Social Worker

## 2023-01-04 ENCOUNTER — Encounter (HOSPITAL_COMMUNITY): Payer: Self-pay

## 2023-01-04 ENCOUNTER — Ambulatory Visit (HOSPITAL_COMMUNITY): Payer: Medicaid Other | Admitting: Licensed Clinical Social Worker

## 2023-01-04 DIAGNOSIS — Z1231 Encounter for screening mammogram for malignant neoplasm of breast: Secondary | ICD-10-CM

## 2023-01-04 NOTE — Telephone Encounter (Signed)
The therapist attempts to reach [place] when she no shows for CD IOP leaving a HIPAA-compliant voicemail.   Myrna Blazer, MA, LCSW, [facility], LCAS 01/04/2023

## 2023-01-06 ENCOUNTER — Ambulatory Visit (INDEPENDENT_AMBULATORY_CARE_PROVIDER_SITE_OTHER): Payer: Medicaid Other | Admitting: Licensed Clinical Social Worker

## 2023-01-06 ENCOUNTER — Encounter (HOSPITAL_COMMUNITY): Payer: Self-pay | Admitting: Medical

## 2023-01-06 DIAGNOSIS — F4312 Post-traumatic stress disorder, chronic: Secondary | ICD-10-CM | POA: Diagnosis not present

## 2023-01-06 DIAGNOSIS — Z811 Family history of alcohol abuse and dependence: Secondary | ICD-10-CM

## 2023-01-06 DIAGNOSIS — G621 Alcoholic polyneuropathy: Secondary | ICD-10-CM

## 2023-01-06 DIAGNOSIS — F17200 Nicotine dependence, unspecified, uncomplicated: Secondary | ICD-10-CM

## 2023-01-06 DIAGNOSIS — F1110 Opioid abuse, uncomplicated: Secondary | ICD-10-CM

## 2023-01-06 DIAGNOSIS — Z8679 Personal history of other diseases of the circulatory system: Secondary | ICD-10-CM

## 2023-01-06 DIAGNOSIS — F1220 Cannabis dependence, uncomplicated: Secondary | ICD-10-CM

## 2023-01-06 DIAGNOSIS — F4321 Adjustment disorder with depressed mood: Secondary | ICD-10-CM

## 2023-01-06 DIAGNOSIS — T7432XS Child psychological abuse, confirmed, sequela: Secondary | ICD-10-CM

## 2023-01-06 DIAGNOSIS — F1020 Alcohol dependence, uncomplicated: Secondary | ICD-10-CM | POA: Diagnosis not present

## 2023-01-06 DIAGNOSIS — G894 Chronic pain syndrome: Secondary | ICD-10-CM

## 2023-01-06 DIAGNOSIS — T7491XS Unspecified adult maltreatment, confirmed, sequela: Secondary | ICD-10-CM

## 2023-01-06 DIAGNOSIS — K7030 Alcoholic cirrhosis of liver without ascites: Secondary | ICD-10-CM

## 2023-01-06 DIAGNOSIS — F419 Anxiety disorder, unspecified: Secondary | ICD-10-CM

## 2023-01-06 DIAGNOSIS — N838 Other noninflammatory disorders of ovary, fallopian tube and broad ligament: Secondary | ICD-10-CM

## 2023-01-06 DIAGNOSIS — F341 Dysthymic disorder: Secondary | ICD-10-CM

## 2023-01-06 MED ORDER — PREGABALIN 150 MG PO CAPS: 150.0000 mg | ORAL_CAPSULE | Freq: Four times a day (QID) | ORAL | 1 refills | Status: DC
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
## 2023-01-08 NOTE — Progress Notes (Signed)
Daily Group Progress Note   Program: CD IOP     Individual Time: 9 a.m. to 12 p.m.    Type of Therapy: Process and Psychoeducational    Topic: The therapist checks in with group members, assesses for SI/HI/psychosis and overall level of functioning. The therapist inquires about sobriety date and number of community support meetings attended since last session.   The therapist informs group members not present at last group of another group member's passing. The therapist has group members share their stories of how they came to be in CD IOP for a new group member starting today. The therapist primarily focuses on working to get group members to understand the things that their disease will tell them to justify using and to get them to ignore all of the data pointing to the fact that using will definitely cause them to continue to have serious problems resulting in "jails, institutions, or death." The therapist continues to emphasize the importance of avoiding triggers and recommends having non-using support remove old stashes which represent the "things" in "people, places and things."    Summary: [name] presents rating her depression as a "6" and her anxiety as an "8."  [name] says that she was not in group Wednesday as she was sick but is feeling better now. She notes that she is once again "on the right track." The therapist observes that [name] admitted to previously giving the impression that she was doing well when she was not with [name] saying that this is not the case now.  She has been off this week; however, she has not attended a variety of different meetings as she planned to do with this therapist encouraging her to do so in part so she can get a Marketing executive. The therapist discloses his belief that [name]'s getting the right Sponsor will likely be a Secretary/administrator for her.   [name] schedules to see this therapist for an individual session during break.   Progress Towards Goals: [name] reports no drug  or alcohol use   UDS collected: No Results: No   AA/NA attended?: Yes   Sponsor?: No   Bethany Blazer, MA, LCSW, [facility], LCAS 01/06/2023

## 2023-01-09 ENCOUNTER — Encounter: Payer: Self-pay | Admitting: Psychiatry

## 2023-01-09 ENCOUNTER — Encounter (HOSPITAL_COMMUNITY): Payer: Self-pay

## 2023-01-09 ENCOUNTER — Telehealth: Payer: Self-pay | Admitting: *Deleted

## 2023-01-09 ENCOUNTER — Inpatient Hospital Stay: Payer: Medicaid Other | Attending: Psychiatry | Admitting: Psychiatry

## 2023-01-09 ENCOUNTER — Ambulatory Visit (HOSPITAL_COMMUNITY): Payer: Medicaid Other

## 2023-01-09 ENCOUNTER — Inpatient Hospital Stay: Payer: Medicaid Other

## 2023-01-09 VITALS — BP 129/64 | HR 66 | Temp 98.1°F | Wt 132.0 lb

## 2023-01-09 DIAGNOSIS — Z79899 Other long term (current) drug therapy: Secondary | ICD-10-CM | POA: Insufficient documentation

## 2023-01-09 DIAGNOSIS — R590 Localized enlarged lymph nodes: Secondary | ICD-10-CM | POA: Diagnosis not present

## 2023-01-09 DIAGNOSIS — K7030 Alcoholic cirrhosis of liver without ascites: Secondary | ICD-10-CM

## 2023-01-09 DIAGNOSIS — D398 Neoplasm of uncertain behavior of other specified female genital organs: Secondary | ICD-10-CM | POA: Diagnosis present

## 2023-01-09 DIAGNOSIS — K219 Gastro-esophageal reflux disease without esophagitis: Secondary | ICD-10-CM | POA: Insufficient documentation

## 2023-01-09 DIAGNOSIS — Z791 Long term (current) use of non-steroidal anti-inflammatories (NSAID): Secondary | ICD-10-CM | POA: Insufficient documentation

## 2023-01-09 DIAGNOSIS — F419 Anxiety disorder, unspecified: Secondary | ICD-10-CM | POA: Insufficient documentation

## 2023-01-09 DIAGNOSIS — R634 Abnormal weight loss: Secondary | ICD-10-CM | POA: Insufficient documentation

## 2023-01-09 DIAGNOSIS — N9489 Other specified conditions associated with female genital organs and menstrual cycle: Secondary | ICD-10-CM

## 2023-01-09 LAB — CBC WITH DIFFERENTIAL (CANCER CENTER ONLY)
Abs Immature Granulocytes: 0.04 10*3/uL (ref 0.00–0.07)
Basophils Absolute: 0.1 10*3/uL (ref 0.0–0.1)
Basophils Relative: 0 %
Eosinophils Absolute: 0.2 10*3/uL (ref 0.0–0.5)
Eosinophils Relative: 1 %
HCT: 38.6 % (ref 36.0–46.0)
Hemoglobin: 12.9 g/dL (ref 12.0–15.0)
Immature Granulocytes: 0 %
Lymphocytes Relative: 25 %
Lymphs Abs: 3 10*3/uL (ref 0.7–4.0)
MCH: 33.1 pg (ref 26.0–34.0)
MCHC: 33.4 g/dL (ref 30.0–36.0)
MCV: 99 fL (ref 80.0–100.0)
Monocytes Absolute: 0.7 10*3/uL (ref 0.1–1.0)
Monocytes Relative: 6 %
Neutro Abs: 8.1 10*3/uL — ABNORMAL HIGH (ref 1.7–7.7)
Neutrophils Relative %: 68 %
Platelet Count: 254 10*3/uL (ref 150–400)
RBC: 3.9 MIL/uL (ref 3.87–5.11)
RDW: 14.2 % (ref 11.5–15.5)
WBC Count: 12.1 10*3/uL — ABNORMAL HIGH (ref 4.0–10.5)
nRBC: 0 % (ref 0.0–0.2)

## 2023-01-09 LAB — CMP (CANCER CENTER ONLY)
ALT: 16 U/L (ref 0–44)
AST: 20 U/L (ref 15–41)
Albumin: 4.3 g/dL (ref 3.5–5.0)
Alkaline Phosphatase: 184 U/L — ABNORMAL HIGH (ref 38–126)
Anion gap: 9 (ref 5–15)
BUN: 9 mg/dL (ref 6–20)
CO2: 32 mmol/L (ref 22–32)
Calcium: 10 mg/dL (ref 8.9–10.3)
Chloride: 100 mmol/L (ref 98–111)
Creatinine: 0.71 mg/dL (ref 0.44–1.00)
GFR, Estimated: >60 mL/min (ref >60)
Glucose, Bld: 104 mg/dL — ABNORMAL HIGH (ref 70–99)
Potassium: 4.3 mmol/L (ref 3.5–5.1)
Sodium: 141 mmol/L (ref 135–145)
Total Bilirubin: 0.5 mg/dL (ref 0.3–1.2)
Total Protein: 7.7 g/dL (ref 6.5–8.1)

## 2023-01-09 LAB — APTT: aPTT: 31 seconds (ref 24–36)

## 2023-01-09 LAB — PROTIME-INR
INR: 1 (ref 0.8–1.2)
Prothrombin Time: 12.8 seconds (ref 11.4–15.2)

## 2023-01-09 LAB — CEA (ACCESS): CEA (CHCC): 5.77 ng/mL — ABNORMAL HIGH (ref 0.00–5.00)

## 2023-01-09 NOTE — Patient Instructions (Signed)
Hello, please see below for the preoperative instructions for [facility].  IMPORTANT INFORMATION FOR YOUR SURGERY   We will get you scheduled for surgery on __5/23/24___ with Dr. __Newton__.  If you are unable to keep your scheduled surgery date or for any questions or concerns, please call the Advice Nurse at [phone]. For any last minute concerns or cancellations, please call the Hospital Operator at [phone] and ask for the Ob/Gyn Consult Resident on call.   When to come in for surgery: The [facility] will call you the afternoon before surgery (or Friday for Monday surgery) to tell you what time to come in.  If you are not contacted by 4PM, then call [phone].  Do not wait until after 5PM because the office will be closed!  After 5PM, call the Operating Room at [phone]; after 8:30PM, please call [phone].    One week before surgery:  NO full strength aspirin (baby aspirin (81mg ) is ok) for 7 days before surgery.   Please avoid Ibuprofen/Motrin/Aleve.  Any acetaminophen (Tylenol) product is ok.       What to eat and drink:   -- Please?do not eat anything solid after midnight?before your procedure.  -- You may drink only clear liquids (water, clear juice, Gatorade) until?1 hour?before your scheduled arrival time at the hospital.  -- Please drink one 12 ounce bottle of gatorade 1 hour prior to scheduled arrival time.      On the day of surgery:   -- Check in at the [facility].  -- Once in the preoperative area, the surgery team will come to answer any last-minute questions before you go to the operating room and you will meet your anesthesia team.   -- You may receive an injection of short-acting blood thinner before your surgery.       After surgery:  All of these guidelines may be modified by your doctor. Your individual care may be different from what is listed here. Review your post-op instructions.  -- You will most likely go home on the day of surgery.   -- You will have a regular diet after surgery. You may eat what you want, but don't push yourself. Small bites of bland food are best to start. Eat a bite or two of food, then wait 10-15 minutes and see how you feel. If you feel nauseated or not hungry, don't eat.  -- We want you to be up and walking on the day of surgery. Walking is good.    -- Showers are ok after 24 hours. Be gentle with the incisions. Pat dry. No tub baths for the first 4 weeks.  -- First 6 weeks after surgery: No lifting more than 15 pounds, straining, physical exertion, or vigorous exercise.  This is important in order to ensure adequate healing.  -- IMPORTANT if you have had a hysterectomy: No intercourse, douching, or tampon use for 8-12 weeks following surgery. For all other procedures, your surgeon will discuss time to resume these activities.  -- Driving will depend on the specific type of surgery you have had and when you stop needing narcotic pain medication--this may vary from as little as 2 days up to 6 weeks depending on the type of surgery you had. You would need to be strong enough to use your car brakes in an emergency. Your doctor may give you more specific guidelines.    -- First 6 weeks after surgery:  1. No lifting more than 15 pounds, straining, physical exertion, or  vigorous exercise.  This is important in order to ensure adequate healing.  2. Walking is good. However, try to minimize stair use to 1-2 times a day.  3. Driving will depend on the specific type of surgery you have had and when you stop needing narcotic pain medication--this may vary from as little as 2 days up to 6 weeks depending on the type of surgery you had. You would need to be strong enough to use your car brakes in an emergency.  Your doctor may give you more specific guidelines.  IMPORTANT: No intercourse, douching, or tampon use for 8-12 weeks following surgery.     Phone numbers:  - If you have the contact information for your  surgeon's nurse, you can contact them.  - Nursing advice line 9am - 4:30pm: [phone]  - After-hours emergency: [phone], ask for Ob/Gyn Consult resident on-call.    Reasons to Call the Nurse  -- Bleeding (more than spotting)  -- Nausea with vomiting  -- Unable to empty your bladder despite feeling an urge or feeling that your bladder is full  -- Incision problems (pus or other fluid coming out, redness, warmth, increased pain). If your incision opens - do not panic! Just because the skin opens it does not mean that the layers underneath have opened.  -- Fever (100.4 degrees or more)      Things to Expect After Surgery  -- Moderate pain is normal during the first several weeks after surgery. After 5 days or so Ibuprofen may bring more comfort than the stronger pain medicine. Try taking Ibuprofen first and use the stronger medicine for "break-through" pain. You can overlap these medicines because they work differently. If you cannot take Ibuprofen, you can try Extra Strength Tylenol.    -- Constipation -- IT IS ESSENTIAL TO AVOID THIS!!!  Straining with bowel movements after surgery can tear your sutures. This may occur due to dehydration, medications, or a change in activity and eating habits.  Depending on specific instructions from your physician, take Colace twice daily and/or a daily dose of Senna as long as you're on pain medications.  If you do not have a bowel movement in 2 days after surgery, you can take Miralax once per day OR 2 tbs of Milk of Magnesia 1-2 times a day until you have a bowel movement. If diarrhea occurs, decrease the amount or stop the laxative. If no results with Milk of Magnesia, you can drink a bottle of magnesium citrate which you can purchase over the counter.  -- To Prevent Constipation:  Eat a well-balanced diet including protein, grains, fresh fruit and vegetables (unless you have been placed on a Low Residual Diet by your doctor).  Drink plenty of fluids  (see above). Walk regularly.  -- Fatigue - This is a normal response to surgery and will lessen with time.  Plan frequent rest periods throughout the day - especially after bathing, eating, or dressing. Increase your walking as tolerated.  -- Gas Pain - This is very common but can also be very painful! Drink warm liquids such as herbal teas, bouillon or soup. Walking will help you pass more gas.  Mylicon or Gas-X can be taken over the counter.  -- Vaginal Discharge - This may or may not occur after surgery and may last 6-8 weeks after surgery. It is part of the healing process. Do not place anything in the vagina (except estrogen cream if your physician prescribed it for you). You may use a  panty liner.  If the discharge becomes heavy, odorous, bloody, or itches, call the nurse.     Do's and Don'ts  -- Incisions -  If you have Steri-strips on your incision, these will start to peel off in a week or so. They can get wet in the shower and need no special care. Please remove them in 7 days if they have not already peeled off.  -- Exercise - You may walk and gradually increase your activity level as your energy level permits.  Check with your doctor at your post-op visit to see when you can resume normal activity and exercise.  -- Returning to Work - As work demands and recovery times vary widely, it is hard to predict when you will want to return to work. If you have a desk job with no strenuous physical activity, and if you would like to return sooner than generally recommended, this can be arranged.  -- Drink plenty of water - 6-8 8oz. glasses per day.  Avoid caffeine when possible.  -- Elevate your feet when you are sitting.  If there is fluid collection in your feet, this will help to get rid of it.

## 2023-01-09 NOTE — Telephone Encounter (Signed)
[facility] MRN # [id number]

## 2023-01-09 NOTE — Progress Notes (Signed)
GYNECOLOGIC ONCOLOGY NEW PATIENT CONSULTATION  Date of Service: 01/09/2023 Referring Provider: Hoy Register, MD [address] [place] [address] [place],  [place] [address]   ASSESSMENT AND PLAN: [name] is a 52 y.o. woman with a history of alcoholic cirrhosis with a complex right ovarian mass.  We reviewed that the exact etiology of the pelvic mass is unclear, but could include a benign, borderline, or malignant process.  The recommended treatment is surgical excision to make a definitive diagnosis. Because the mass is relatively small, we feel that a minimally invasive approach is feasible, using robotic assistance.    In the event of malignancy or borderline tumor on frozen section, we will perform indicated staging procedures. We discussed that these procedures may include hysterectomy, omentectomy pelvic and/or para-aortic lymphadenectomy, peritoneal biopsies. We would also remove any tissue concerning for metastatic disease which could require additional procedures including bowel surgery.  Discussed pros/cons of hysterectomy for benign adnexal mass. Given pt's medical comorbidities, and pt with normal endometrium on MRI, history of normal pap smears, no clear indication for hysterectomy. Discussed longer recovery and added risks and patient is in agreement to avoid hysterectomy unless medically indicated based on findings, frozen pathology.  Patient was consented for: pelvic exam under anesthesia, robotic assisted bilateral salpingo-oophorectomy, possible hysterectomy, possible staging on 02/02/23 at [facility]. Offered pt OR times at [facility] and [place]. Pt chose sooner available OR time at [facility].  The risks of surgery were discussed in detail and she understands these to including but not limited to bleeding requiring a blood transfusion, infection, injury to adjacent organs (including but not limited to the bowels, bladder, ureters, nerves, blood vessels), thromboembolic events, wound separation, hernia,  vaginal cuff separation, possible risk of lymphedema and lymphocyst if lymphadenectomy performed, unforseen complication, possible need for re-exploration, and medical complications such as heart attack, stroke, pneumonia.  If the patient experiences any of these events, she understands that her hospitalization or recovery may be prolonged and that she may need to take additional medications for a prolonged period. The patient will receive DVT and antibiotic prophylaxis as indicated. She voiced a clear understanding. She had the opportunity to ask questions and informed consent was obtained today. She wishes to proceed.  She will proceed to the lab today for CA125 and CEA.  Given her history of alcoholic cirrhosis, we will also get baseline labs, CBC, CMP, PT/INR, PTT. We will calculate her MELD score. She has follow-up with GI on 5/1. We will obtain clearance from her GI team. She is up-to-date on colonoscopy and had endoscopy 02/2022 without evidence of varices.   We will plan for precare appointment with [facility]. Pt to hold celebrex x1 week prior to surgery.  Her METs are >4.  All preoperative instructions were reviewed. Postoperative expectations were also reviewed. Written handouts were provided to the patient.   A copy of this note was sent to the patient's referring provider.  Clide Cliff, MD Gynecologic Oncology   Medical Decision Making I personally spent  TOTAL 50 minutes face-to-face and non-face-to-face in the care of this patient, which includes all pre, intra, and post visit time on the date of service.   ------------  CC: Complex adnexal mass  HISTORY OF PRESENT ILLNESS:  [name] is a 52 y.o. woman who is seen in consultation at the request of Hoy Register, MD for evaluation of complex adnexal mass.  Patient was seen on 08/31/2022 with [facility] gastroenterology for follow-up of alcohol related cirrhosis.  At that  time, she reported some continued upper abdominal  pain either on the right or left.  She also noted a 15 pound weight loss since a foot injury however her weight was relatively stable at the GI clinic.  She also reported increased abdominal girth.  Given that she underwent a CT abdomen/pelvis 09/02/2022 which noted a 4.9 cm right adnexal mass.  This was previously 3.8 cm on prior studies.  An MRI was then performed on 12/19/2022 which noted a 4.9 x 3.8 x 4.2 complex cystic mass in the right ovary.  No pelvic adenopathy noted.  Given this she was referred for further evaluation.  Patient presents today with her mom.  She reports that she has felt bloated for about a year.  She previously reported some weight loss but has now been gaining weight per her report.  She otherwise denies early satiety, change in bowel or bladder habits.  Patient reports that she started with an intensive outpatient therapy program for her alcohol misuse, starting in January.  She reports that she has been 90 days doing well.  She denies any history of IV drug use.  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  Patient has follow-up with gastroenterology on 01/11/2023.  She previously had an endoscopy 02/2022 without evidence of varices.   PAST MEDICAL HISTORY: Past Medical History:  Diagnosis Date   Anxiety    Arthritis    right foot   Cirrhosis (HCC)    GERD (gastroesophageal reflux disease)    History of alcoholism (HCC)    History of cellulitis    2015  right side face   Hypertension    followed by pcp  (per pt never had stress test)   Malnutrition (HCC)    Metatarsal fracture    right 2nd   Peripheral neuropathy    feet   Strachan's syndrome    painful neuropathy   Wears dentures    upper   Wears glasses     PAST SURGICAL HISTORY: Past Surgical History:  Procedure Laterality Date   BREAST BIOPSY Right 2023   MULTIPLE EXTRACTIONS WITH ALVEOLOPLASTY N/A 12/25/2013   Procedure: Extraction of tooth #'s  2,3,4,5,6,8,9,10,11,12,13,14,15,20,21,22,23,24,25,26,27, 16,10,96,04 with alveoloplasty and bilateral mandibular tori reductions;  Surgeon: Charlynne Pander, DDS;  Location: [facility] OR;  Service: Oral Surgery;  Laterality: N/A;   ORIF TOE FRACTURE Right 07/03/2019   Procedure: OPEN REDUCTION INTERNAL FIXATION (ORIF) METATARSAL (TOE) FRACTURE;  Surgeon: Vivi Barrack, [name];  Location: [facility];  Service: Podiatry;  Laterality: Right;  LEG BLOCK   SURAL NERVE BX Right 12/24/2013   Procedure: SURAL NERVE BIOPSY;  Surgeon: Barnett Abu, MD;  Location: [facility];  Service: Neurosurgery;  Laterality: Right;   TUBAL LIGATION Bilateral 2002   PPTL    OB/GYN HISTORY: OB History  Gravida Para Term Preterm AB Living  2 2 1 1   2   SAB IAB Ectopic Multiple Live Births          2    # Outcome Date GA Lbr Len/2nd Weight Sex Delivery Anes PTL Lv  2 Term      Vag-Spont   LIV  1 Preterm      Vag-Spont   LIV      Age at menarche: 39 Age at menopause: 22 Hx of HRT: no Hx of STI: no Last pap: 12/15/2020 NILM, HPV neg History of abnormal pap smears: no  SCREENING STUDIES:  Last mammogram: 12/2022 Last colonoscopy: 02/17/2021  MEDICATIONS:  Current Outpatient Medications:    amLODipine (NORVASC) 5 MG tablet, Take 1 tablet (5 mg total) by mouth daily., Disp: 90 tablet, Rfl: 1   atorvastatin (LIPITOR) 10 MG tablet, Take 1 tablet (10 mg total) by mouth daily., Disp: 90 tablet, Rfl: 3   baclofen (LIORESAL) 10 MG tablet, Take 1 tablet (10 mg total) by mouth 3 (three) times daily., Disp: 270 tablet, Rfl: 0   buPROPion (WELLBUTRIN SR) 200 MG 12 hr tablet, Take 1 tablet (200 mg total) by mouth 2 (two) times daily., Disp: 60 tablet, Rfl: 2   busPIRone (BUSPAR) 30 MG tablet, Take 1 tablet (30 mg total) by mouth 2 (two) times daily., Disp: 60 tablet, Rfl: 2   celecoxib (CELEBREX) 100 MG capsule, Take 1 capsule (100 mg total) by mouth 2 (two) times daily., Disp: 60 capsule, Rfl: 2   ciclopirox  (PENLAC) 8 % solution, Apply topically at bedtime. Apply over nail and surrounding skin. Apply daily over previous coat. After seven (7) days, may remove with alcohol and continue cycle., Disp: 6.6 mL, Rfl: 2   cloNIDine (CATAPRES) 0.1 MG tablet, Take 1 tablet (0.1 mg total) by mouth at bedtime. For hot flashes, Disp: 90 tablet, Rfl: 1   hydrALAZINE (APRESOLINE) 10 MG tablet, TAKE 1 TABLET BY MOUTH THREE TIMES A DAY, Disp: 270 tablet, Rfl: 0   Multiple Vitamins-Minerals (MULTIVITAMIN WITH MINERALS) tablet, Take 1 tablet by mouth daily., Disp: , Rfl:    naltrexone (DEPADE) 50 MG tablet, Take 1 tablet (50 mg total) by mouth daily., Disp: 90 tablet, Rfl: 0   omeprazole (PRILOSEC) 20 MG capsule, TAKE 1 CAPSULE BY MOUTH EVERY DAY, Disp: 90 capsule, Rfl: 0   potassium chloride SA (KLOR-CON M) 20 MEQ tablet, Take 2 tablets (40 mEq total) by mouth daily., Disp: 30 tablet, Rfl: 0   pregabalin (LYRICA) 150 MG capsule, Take 1 capsule (150 mg total) by mouth in the morning, at noon, in the evening, and at bedtime., Disp: 120 capsule, Rfl: 1   traZODone (DESYREL) 100 MG tablet, Take 1 to 1.5 tablets by mouth daily at bedtime as needed for sleep., Disp: 45 tablet, Rfl: 0  ALLERGIES: Allergies  Allergen Reactions   Cymbalta [Duloxetine Hcl] Other (See Comments)    Cirrhosis Pt on 6 week taper   Macrobid [Nitrofurantoin Macrocrystal] Anaphylaxis and Other (See Comments)    Patient reports 2 syncope episodes after her mouth became numb    FAMILY HISTORY: Family History  Adopted: Yes  Problem Relation Age of Onset   Hypertension Mother    Raynaud syndrome Mother    Diabetes Father    Drug abuse Maternal Grandmother    Cancer Maternal Grandmother        intestinal   Hypertension Maternal Grandmother    Lung cancer Maternal Grandfather    Hypertension Paternal Grandfather    Diabetes Maternal Aunt    Heart disease Maternal Aunt    Diabetes Maternal Uncle    Heart disease Maternal Uncle    Colon  cancer Neg Hx    Colon polyps Neg Hx    Esophageal cancer Neg Hx    Rectal cancer Neg Hx    Stomach cancer Neg Hx    Breast cancer Neg Hx    Ovarian cancer Neg Hx    Endometrial cancer Neg Hx     SOCIAL HISTORY: Social History   Socioeconomic History   Marital status: Widowed    Spouse name: Not on file   Number of children:  1   Years of education: hs   Highest education level: Not on file  Occupational History   Occupation: food service  Tobacco Use   Smoking status: Every Day    Packs/day: 0.50    Years: 20.00    Additional pack years: 0.00    Total pack years: 10.00    Types: Cigarettes   Smokeless tobacco: Never  Vaping Use   Vaping Use: Never used  Substance and Sexual Activity   Alcohol use: Not Currently    Comment: 07-02-2019-- per pt 4 drinks per day   Drug use: Not Currently    Comment: 07-02-2019  -- per pt last used in age 42s   Sexual activity: Yes    Partners: Male    Birth control/protection: Surgical  Other Topics Concern   Not on file  Social History Narrative   patient is widowed.  Patient lives with daughter in a 2 story home.    Patient works in Personnel officer.    Education high school.   Right handed.   Caffeine sometimes tea.   Lives in a one story home   Social Determinants of Health   Financial Resource Strain: Not on file  Food Insecurity: Not on file  Transportation Needs: Not on file  Physical Activity: Not on file  Stress: Not on file  Social Connections: Not on file  Intimate Partner Violence: Not on file    REVIEW OF SYSTEMS: New patient intake form was reviewed.  Complete 10-system review is negative except for the following: decreased concentration, numbness, anxiety, dizziness, gait problem  PHYSICAL EXAM: BP 129/64 (BP Location: Left Arm, Patient Position: Sitting)   Pulse 66   Temp 98.1 F (36.7 C) (Oral)   Wt 132 lb (59.9 kg)   LMP  (LMP Unknown)   SpO2 94%   BMI 22.66 kg/m  Constitutional: No acute  distress. Neuro/Psych: Alert, oriented.  Head and Neck: Normocephalic, atraumatic. Neck symmetric without masses. Sclera anicteric.  Respiratory: Normal work of breathing. Clear to auscultation bilaterally. Cardiovascular: Regular rate and rhythm, no murmurs, rubs, or gallops. Abdomen: Normoactive bowel sounds. Soft, non-tender to palpation. No masses appreciated. Slightly enlarged liver edge on right. No evidence of hernia. No prominent abdominal varices. Abdomen slightly distended. No clear fluid wave. Well healed periumbilical incision. Extremities: Grossly normal range of motion. Warm, well perfused. No edema bilaterally. Skin: No rashes or lesions. Lymphatic: No cervical, supraclavicular, or inguinal adenopathy. Genitourinary: External genitalia without lesions. Urethral meatus without lesions or prolapse. On speculum exam, vagina and cervix without lesions. Bimanual exam reveals mobile right adnexal mass. Rectovaginal exam confirms the above findings and reveals normal sphincter tone and no masses or nodularity. Exam chaperoned by Warner Mccreedy, NP   LABORATORY AND RADIOLOGIC DATA: Outside medical records were reviewed to synthesize the above history, along with the history and physical obtained during the visit.  Outside laboratory, pathology, and imaging reports were reviewed, with pertinent results below.  I personally reviewed the outside images.  WBC  Date Value Ref Range Status  08/31/2022 12.2 (H) 4.0 - 10.5 K/uL Final   WBC Count  Date Value Ref Range Status  01/09/2023 12.1 (H) 4.0 - 10.5 K/uL Final   Hemoglobin  Date Value Ref Range Status  01/09/2023 12.9 12.0 - 15.0 g/dL Final  16/06/9603 54.0 11.1 - 15.9 g/dL Final   HCT  Date Value Ref Range Status  01/09/2023 38.6 36.0 - 46.0 % Final   Hematocrit  Date Value Ref Range Status  08/26/2021 40.7 34.0 - 46.6 % Final   Platelets  Date Value Ref Range Status  08/26/2021 199 150 - 450 x10E3/uL Final   Platelet  Count  Date Value Ref Range Status  01/09/2023 254 150 - 400 K/uL Final   Magnesium  Date Value Ref Range Status  12/20/2013 1.6 1.5 - 2.5 mg/dL Final   Creatinine  Date Value Ref Range Status  01/09/2023 0.71 0.44 - 1.00 mg/dL Final   Creat  Date Value Ref Range Status  07/19/2016 0.70 0.50 - 1.10 mg/dL Final   AST  Date Value Ref Range Status  01/09/2023 20 15 - 41 U/L Final   ALT  Date Value Ref Range Status  01/09/2023 16 0 - 44 U/L Final   Cancer Antigen (CA) 125  Date Value Ref Range Status  01/09/2023 12.7 0.0 - 38.1 U/mL Final    Comment:    (NOTE) Roche Diagnostics Electrochemiluminescence Immunoassay (ECLIA) Values obtained with different assay methods or kits cannot be used interchangeably.  Results cannot be interpreted as absolute evidence of the presence or absence of malignant disease. Performed At: [facility] [address] [place], [place] [id number] Jolene Schimke MD WU:[id number]    CEA [facility])  Date Value Ref Range Status  01/09/2023 5.77 (H) 0.00 - 5.00 ng/mL Final    Comment:    (NOTE) This test was performed using Beckman Coulter's paramagnetic chemiluminescent immunoassay. Values obtained from different assay methods cannot be used interchangeably. Please note that up to 8% of patients who smoke may see values 5.1-10.0 ng/ml and 1% of patients who smoke may see CEA levels >10.0 ng/ml. Performed at [facility], [address] [place], [place] [address]    AFP-Tumor Marker  Date Value Ref Range Status  08/31/2022 3.7 ng/mL Final    Comment:    Reference Range:   <6.1 The use of AFP as a tumor marker in pregnant females is not recommended. . . This test was performed using the Beckman Coulter chemiluminescent method. Values obtained from different assay methods cannot be used interchangeably. AFP levels, regardless of value, should not be interpreted as absolute evidence of the presence or absence of  disease. Marland Kitchen   12/24/2021 4.1 ng/mL Final    Comment:    Reference Range:   <6.1 The use of AFP as a tumor marker in pregnant females is not recommended. . . This test was performed using the Beckman Coulter chemiluminescent method. Values obtained from different assay methods cannot be used interchangeably. AFP levels, regardless of value, should not be interpreted as absolute evidence of the presence or absence of disease. .    Diagnosis  Date Value Ref Range Status  12/15/2020   Final   - Negative for intraepithelial lesion or malignancy (NILM)  05/24/2017   Final   NEGATIVE FOR INTRAEPITHELIAL LESIONS OR MALIGNANCY.   HPV  Date Value Ref Range Status  05/24/2017 NOT DETECTED  Final    Comment:    Normal Reference Range - NOT Detected    MR Pelvis W Wo Contrast 12/19/2022  Narrative CLINICAL DATA:  52 year old female with right adnexal indeterminate mass on recent CT.  EXAM: MRI PELVIS WITHOUT AND WITH CONTRAST  TECHNIQUE: Multiplanar multisequence MR imaging of the pelvis was performed both before and after administration of intravenous contrast.  CONTRAST:  7 cc Vueway IV  COMPARISON:  09/02/2022 CT abdomen/pelvis.  FINDINGS: Urinary Tract:  Normal bladder.  Normal urethra.  Bowel: Visualized small and large bowel are normal caliber  with no bowel wall thickening.  Vascular/Lymphatic: No pathologically enlarged lymph nodes in the pelvis. Top-normal caliber symmetric bilateral inguinal lymph nodes are stable from 09/02/2022 CT. No acute vascular abnormality in the pelvis.  Reproductive:  Uterus: The anteverted normal size uterus measures 5.9 x 2.4 x 4.0 cm. No uterine fibroids. Inner myometrium (junctional zone) measures 3 mm in thickness, which is within normal limits. Endometrium measures 2 mm in bilayer thickness, which is within normal limits. No endometrial cavity fluid or focal endometrial mass. Normal uterine cervix with intact cervical  fibrous stroma.  Ovaries and Adnexa: The right ovary measures 5.2 x 4.1 x 4.4 cm and is largely replaced by a 4.9 x 3.8 x 4.2 cm complex cystic mass with T2 hypointense faintly enhancing signal along the posterior margin measuring 1.5 cm thickness (series 16/image 40). The left ovary measures 2.4 x 1.9 x 1.5 cm and contains tiny simple cysts measuring up to the 1.1 cm. No overtly suspicious ovarian or adnexal masses.  Other: No abnormal free fluid in the pelvis. No focal pelvic fluid collection.  Musculoskeletal: No aggressive appearing focal osseous lesions.  IMPRESSION: 1. 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 This mass has grown slowly on several CT studies back to 09/03/2021. Low-grade cystic malignancy versus borderline neoplasm of the right ovary cannot be excluded. GYN ONC consultation suggested. 2. Normal uterus. 3. No pelvic adenopathy.  These results will be called to the ordering clinician or representative by the Radiologist Assistant, and communication documented in the PACS or [employer].   Electronically Signed By: Delbert Phenix M.D. On: 12/19/2022 15:47   CT ABDOMEN PELVIS W WO CONTRAST 09/02/2022  Narrative CLINICAL DATA:  Abdominal pain. Unintentional weight loss. Alcoholic cirrhosis.  EXAM: CT ABDOMEN AND PELVIS WITHOUT AND WITH CONTRAST  TECHNIQUE: Multidetector CT imaging of the abdomen and pelvis was performed following the standard protocol before and following the bolus administration of intravenous contrast.  RADIATION DOSE REDUCTION: This exam was performed according to the departmental dose-optimization program which includes automated exposure control, adjustment of the mA and/or kV according to patient size and/or use of iterative reconstruction technique.  CONTRAST:  OMNIPAQUE IOHEXOL 300 MG/ML   SOLN  COMPARISON:  12/08/2021  FINDINGS: Lower Chest: No acute findings.  Hepatobiliary: Cirrhosis is again demonstrated. No hepatic masses are identified. No evidence of ascites.  A tiny less than 5 mm calcified gallstone is noted, however there is no evidence of cholecystitis or biliary ductal dilatation.  Pancreas:  No mass or inflammatory changes.  Spleen: Within normal limits in size and appearance.  Adrenals/Urinary Tract: No suspicious masses identified. 2 mm nonobstructing right renal calculus noted. No evidence of ureteral calculi or hydronephrosis.  Stomach/Bowel: No evidence of obstruction, inflammatory process or abnormal fluid collections.  Vascular/Lymphatic: Mild lymphadenopathy in the gastrohepatic ligament and porta hepatis remains stable. Sub-cm retroperitoneal lymph nodes are also unchanged. These are likely reactive in etiology in setting of cirrhosis. No acute vascular findings. Retroaortic left renal vein and aortic atherosclerosis incidentally noted.  Reproductive: Normal uterus. A right adnexal mass which has higher than fluid attenuation measures 4.9 x 3.1 cm, compared to 3.8 x 2.9 cm on prior study. Clips are noted from previous bilateral tubal ligation. No other masses or free fluid.  Other:  None.  Musculoskeletal:  No suspicious bone lesions identified.  IMPRESSION: Increased size of 4.9 cm right  adnexal mass. Recommend pelvic MRI without and with contrast for further characterization.  Hepatic cirrhosis. No evidence of hepatic neoplasm.  Stable mild abdominal lymphadenopathy, likely reactive in setting of cirrhosis.  Cholelithiasis. No radiographic evidence of cholecystitis.  Aortic Atherosclerosis (ICD10-I70.0).   Electronically Signed By: Danae Orleans M.D. On: 09/07/2022 09:23

## 2023-01-10 LAB — CA 125: Cancer Antigen (CA) 125: 12.7 U/mL (ref 0.0–38.1)

## 2023-01-11 ENCOUNTER — Telehealth (HOSPITAL_COMMUNITY): Payer: Self-pay | Admitting: Licensed Clinical Social Worker

## 2023-01-11 ENCOUNTER — Ambulatory Visit (INDEPENDENT_AMBULATORY_CARE_PROVIDER_SITE_OTHER): Payer: Medicaid Other | Admitting: Gastroenterology

## 2023-01-11 ENCOUNTER — Encounter: Payer: Self-pay | Admitting: Gastroenterology

## 2023-01-11 ENCOUNTER — Encounter (HOSPITAL_COMMUNITY): Payer: Self-pay

## 2023-01-11 ENCOUNTER — Encounter: Payer: Self-pay | Admitting: Psychiatry

## 2023-01-11 ENCOUNTER — Telehealth: Payer: Self-pay | Admitting: *Deleted

## 2023-01-11 ENCOUNTER — Ambulatory Visit (HOSPITAL_COMMUNITY): Payer: Medicaid Other

## 2023-01-11 VITALS — BP 90/60 | HR 72 | Ht 64.0 in | Wt 135.2 lb

## 2023-01-11 DIAGNOSIS — Z23 Encounter for immunization: Secondary | ICD-10-CM | POA: Diagnosis not present

## 2023-01-11 DIAGNOSIS — F1010 Alcohol abuse, uncomplicated: Secondary | ICD-10-CM

## 2023-01-11 DIAGNOSIS — K7030 Alcoholic cirrhosis of liver without ascites: Secondary | ICD-10-CM | POA: Diagnosis not present

## 2023-01-11 DIAGNOSIS — R7989 Other specified abnormal findings of blood chemistry: Secondary | ICD-10-CM

## 2023-01-11 DIAGNOSIS — R1010 Upper abdominal pain, unspecified: Secondary | ICD-10-CM | POA: Diagnosis not present

## 2023-01-11 NOTE — Patient Instructions (Signed)
_______________________________________________________  If your blood pressure at your visit was 140/90 or greater, please contact your primary care physician to follow up on this.  _______________________________________________________  If you are age 52 or older, your body mass index should be between 23-30. Your Body mass index is 23.22 kg/m. If this is out of the aforementioned range listed, please consider follow up with your Primary Care Provider.  If you are age 74 or younger, your body mass index should be between 19-25. Your Body mass index is 23.22 kg/m. If this is out of the aformentioned range listed, please consider follow up with your Primary Care Provider.   ________________________________________________________  The [place] [facility] providers would like to encourage you to use [facility] to communicate with providers for non-urgent requests or questions.  Due to long hold times on the telephone, sending your provider a message by [facility] may be a faster and more efficient way to get a response.  Please allow 48 business hours for a response.  Please remember that this is for non-urgent requests.  _______________________________________________________  [name] will be due for a recall ultrasound in 2 months. We will send you a reminder in the mail when it gets closer to that time.  It was a pleasure to see you today!  Thank you for trusting me with your gastrointestinal care!

## 2023-01-11 NOTE — Telephone Encounter (Signed)
Per Dr Alvester Morin fax surgical optimization form to cardiology office

## 2023-01-11 NOTE — Progress Notes (Signed)
[facility] Progress Note  Chief Complaint: Cirrhosis  Subjective  History: [name] follows up with me today for her alcohol-related cirrhosis.  Last clinic visit with me on 08/31/2022. I received an office note from [facility] when she was seen there on 11/30/2022 for treatment of hypertension.  She had orthostatic symptoms, so her hydralazine was discontinued.   Recent is doing pretty well today, and is glad to report she is 90 days sober and still in an intensive outpatient alcohol rehab program.  Still smoking, but plans to work on that next.  Last CT scan 4 hepatoma screening incidentally discovered an adnexal lesion for which she will soon be having surgery.  (Recent gynecology note reviewed, surgeon requesting preoperative evaluation from me regarding this patient cirrhosis) Still has some right upper quadrant pain as before but it has decreased.  Appetite generally good and weight stable. ROS: Cardiovascular:  no chest pain Respiratory: no dyspnea  The patient's Past Medical, Family and Social History were reviewed and are on file in the EMR.  Objective:  Med list reviewed  Current Outpatient Medications:    amLODipine (NORVASC) 5 MG tablet, Take 1 tablet (5 mg total) by mouth daily., Disp: 90 tablet, Rfl: 1   atorvastatin (LIPITOR) 10 MG tablet, Take 1 tablet (10 mg total) by mouth daily., Disp: 90 tablet, Rfl: 3   baclofen (LIORESAL) 10 MG tablet, Take 1 tablet (10 mg total) by mouth 3 (three) times daily., Disp: 270 tablet, Rfl: 0   buPROPion (WELLBUTRIN SR) 200 MG 12 hr tablet, Take 1 tablet (200 mg total) by mouth 2 (two) times daily., Disp: 60 tablet, Rfl: 2   busPIRone (BUSPAR) 30 MG tablet, Take 1 tablet (30 mg total) by mouth 2 (two) times daily., Disp: 60 tablet, Rfl: 2   celecoxib (CELEBREX) 100 MG capsule, Take 1 capsule (100 mg total) by mouth 2 (two) times daily., Disp: 60 capsule, Rfl: 2   cloNIDine (CATAPRES) 0.1 MG tablet, Take 1 tablet (0.1 mg total)  by mouth at bedtime. For hot flashes (Patient not taking: Reported on 01/11/2023), Disp: 90 tablet, Rfl: 1   hydrALAZINE (APRESOLINE) 10 MG tablet, TAKE 1 TABLET BY MOUTH THREE TIMES A DAY (Patient not taking: Reported on 01/11/2023), Disp: 270 tablet, Rfl: 0   Multiple Vitamins-Minerals (MULTIVITAMIN WITH MINERALS) tablet, Take 1 tablet by mouth daily., Disp: , Rfl:    naltrexone (DEPADE) 50 MG tablet, Take 1 tablet (50 mg total) by mouth daily., Disp: 90 tablet, Rfl: 0   omeprazole (PRILOSEC) 20 MG capsule, TAKE 1 CAPSULE BY MOUTH EVERY DAY (Patient taking differently: Take 20 mg by mouth as needed.), Disp: 90 capsule, Rfl: 0   potassium chloride SA (KLOR-CON M) 20 MEQ tablet, Take 2 tablets (40 mEq total) by mouth daily., Disp: 30 tablet, Rfl: 0   pregabalin (LYRICA) 150 MG capsule, Take 1 capsule (150 mg total) by mouth in the morning, at noon, in the evening, and at bedtime., Disp: 120 capsule, Rfl: 1   traZODone (DESYREL) 100 MG tablet, Take 1 to 1.5 tablets by mouth daily at bedtime as needed for sleep., Disp: 45 tablet, Rfl: 0   Vital signs in last 24 hrs: Vitals:   01/11/23 1323  BP: 90/60  Pulse: 72   Wt Readings from Last 3 Encounters:  01/11/23 135 lb 4 oz (61.3 kg)  01/09/23 132 lb (59.9 kg)  11/17/22 129 lb 6.4 oz (58.7 kg)    Physical Exam  Well-appearing HEENT: sclera anicteric,  oral mucosa moist without lesions Neck: supple, no thyromegaly, JVD or lymphadenopathy Cardiac: Regular without appreciable murmur,  no peripheral edema Pulm: clear to auscultation bilaterally, normal RR and effort noted Abdomen: soft, no tenderness, with active bowel sounds.  Left liver lobe enlarged and palpable on inspiration as before Skin; warm and dry, no jaundice or rash  Labs:     Latest Ref Rng & Units 01/09/2023   12:42 PM 12/06/2022    2:41 PM 11/17/2022    3:03 PM  CMP  Glucose 70 - 99 mg/dL 161  86  83   BUN 6 - 20 mg/dL 9  8  6    Creatinine 0.44 - 1.00 mg/dL 0.96  0.45  4.09    Sodium 135 - 145 mmol/L 141  139  140   Potassium 3.5 - 5.1 mmol/L 4.3  3.9  4.5   Chloride 98 - 111 mmol/L 100  97  98   CO2 22 - 32 mmol/L 32  26  24   Calcium 8.9 - 10.3 mg/dL 81.1  9.5  9.8   Total Protein 6.5 - 8.1 g/dL 7.7  7.0  7.4   Total Bilirubin 0.3 - 1.2 mg/dL 0.5  0.3  0.2   Alkaline Phos 38 - 126 U/L 184  203  203   AST 15 - 41 U/L 20  25  31    ALT 0 - 44 U/L 16  23  23     CMP at the March nephrology office visit was normal except for an alkaline phosphatase of 208.     Latest Ref Rng & Units 01/09/2023   12:42 PM 08/31/2022   11:01 AM 12/08/2021    6:07 PM  CBC  WBC 4.0 - 10.5 K/uL 12.1  12.2  15.8   Hemoglobin 12.0 - 15.0 g/dL 91.4  78.2  95.6   Hematocrit 36.0 - 46.0 % 38.6  41.5  44.7   Platelets 150 - 400 K/uL 254  191.0  222     Magnesium low at 1.5  INR 1.0 on 01/09/23  CEA mildly elevated at 5.77 on 01/09/2023  MELD 3.0: 7 at 01/09/2023 12:42 PM MELD-Na: 6 at 01/09/2023 12:42 PM Calculated from: Serum Creatinine: 0.71 mg/dL (Using min of 1 mg/dL) at 10/26/863 78:46 PM Serum Sodium: 141 mmol/L (Using max of 137 mmol/L) at 01/09/2023 12:42 PM Total Bilirubin: 0.5 mg/dL (Using min of 1 mg/dL) at 9/62/9528 41:32 PM Serum Albumin: 4.3 g/dL (Using max of 3.5 g/dL) at 4/40/1027 25:36 PM INR(ratio): 1.0 at 01/09/2023 12:41 PM Age at listing (hypothetical): 51 years Sex: Female at 01/09/2023 12:42 PM   ___________________________________________ Radiologic studies:  CLINICAL DATA:  Abdominal pain. Unintentional weight loss. Alcoholic cirrhosis.   EXAM: CT ABDOMEN AND PELVIS WITHOUT AND WITH CONTRAST   TECHNIQUE: Multidetector CT imaging of the abdomen and pelvis was performed following the standard protocol before and following the bolus administration of intravenous contrast.   RADIATION DOSE REDUCTION: This exam was performed according to the departmental dose-optimization program which includes automated exposure control, adjustment of the mA and/or  kV according to patient size and/or use of iterative reconstruction technique.   CONTRAST:  OMNIPAQUE IOHEXOL 300 MG/ML  SOLN   COMPARISON:  12/08/2021   FINDINGS: Lower Chest: No acute findings.   Hepatobiliary: Cirrhosis is again demonstrated. No hepatic masses are identified. No evidence of ascites.   A tiny less than 5 mm calcified gallstone is noted, however there is no evidence of cholecystitis or biliary ductal dilatation.  Pancreas:  No mass or inflammatory changes.   Spleen: Within normal limits in size and appearance.   Adrenals/Urinary Tract: No suspicious masses identified. 2 mm nonobstructing right renal calculus noted. No evidence of ureteral calculi or hydronephrosis.   Stomach/Bowel: No evidence of obstruction, inflammatory process or abnormal fluid collections.   Vascular/Lymphatic: Mild lymphadenopathy in the gastrohepatic ligament and porta hepatis remains stable. Sub-cm retroperitoneal lymph nodes are also unchanged. These are likely reactive in etiology in setting of cirrhosis. No acute vascular findings. Retroaortic left renal vein and aortic atherosclerosis incidentally noted.   Reproductive: Normal uterus. A right adnexal mass which has higher than fluid attenuation measures 4.9 x 3.1 cm, compared to 3.8 x 2.9 cm on prior study. Clips are noted from previous bilateral tubal ligation. No other masses or free fluid.   Other:  None.   Musculoskeletal:  No suspicious bone lesions identified.   IMPRESSION: Increased size of 4.9 cm right adnexal mass. Recommend pelvic MRI without and with contrast for further characterization.   Hepatic cirrhosis. No evidence of hepatic neoplasm.   Stable mild abdominal lymphadenopathy, likely reactive in setting of cirrhosis.   Cholelithiasis. No radiographic evidence of cholecystitis.   Aortic Atherosclerosis (ICD10-I70.0).     Electronically Signed   By: Danae Orleans M.D.   On: 09/07/2022 09:23    ____________________________________________ Other:   _____________________________________________ Assessment & Plan  Assessment: Encounter Diagnoses  Name Primary?   Alcoholic cirrhosis of liver without ascites (HCC) Yes   Upper abdominal pain    Alcohol abuse    Need for prophylactic vaccination and inoculation against viral hepatitis    LFT elevation    Chronic stable compensated cirrhosis with low MEL D score.  Normal platelets and INR.  Therefore, her cirrhosis does not appear to significantly increase her surgical risks.  No known intra-abdominal varices on imaging. She is currently 90 days sober from alcohol, for which I applauded her.   Plan: Second hepatitis A vaccine today or soon (6 months after last dose for insurance reasons)  In about 2 months she will be due for ultrasound for [facility] screening, and we will plan a clinical reminder for that.  I also asked her to contact us if she has not heard from [place] by late June about that.  We will do an AFP at that time as well.  Otherwise see me in 6 months or sooner if needed.   Charlie Pitter III

## 2023-01-11 NOTE — Telephone Encounter (Signed)
Lota leaves a voicemail saying that she will not be in group today due to having to be at work at 9 a.m. but will see this therapist for her individual session on Thursday.  [address] MA, LCSW, [facility], LCAS 01/11/2023

## 2023-01-12 ENCOUNTER — Ambulatory Visit (INDEPENDENT_AMBULATORY_CARE_PROVIDER_SITE_OTHER): Payer: Medicaid Other | Admitting: Licensed Clinical Social Worker

## 2023-01-12 DIAGNOSIS — F1020 Alcohol dependence, uncomplicated: Secondary | ICD-10-CM

## 2023-01-12 DIAGNOSIS — F1220 Cannabis dependence, uncomplicated: Secondary | ICD-10-CM

## 2023-01-12 DIAGNOSIS — F17200 Nicotine dependence, unspecified, uncomplicated: Secondary | ICD-10-CM

## 2023-01-12 DIAGNOSIS — F4312 Post-traumatic stress disorder, chronic: Secondary | ICD-10-CM

## 2023-01-12 NOTE — Progress Notes (Signed)
THERAPIST PROGRESS NOTE  Session Time: 9:06 a.m. to 10:05 a.m.   Type of Therapy: Individual   Therapist Response/Interventions: Solution Focused/The therapist suggests that one of the main sticking points remains Margaree's lack of a Sponsor and Step work. He talks to her about looking into inexpensive transportation options such as an e-bike or a 50 cc scooter. He suggest that Taneeka likely need to look at making a change to a job not requiring her to be on her feet all day recommending that she get in touch with [facility]. Tannisha notes that another advantage to finding another job is that the one she has now is only seasonal. The therapist also suggests that at some point down-the-road that Erdine will have to address the issue concerning her living situation with her alcoholic boyfriend and move to a sober living situation. The therapist notes the risks associated with using opioid pain medication given her history of addiction.   Treatment Goals addressed: Leighana will abstain from drugs and alcohol 30/30 days per month as evidenced by negative UDS and will experience a decrease in her depression and anxiety as evidenced by having a PHQ-9 and GAD-7 score of 4 or less.   Summary: Sania presents with her PHQ-9 today being a 3 and her GAD-7 a 5 which have both improved since the 3rd of April. She found out that she has to have surgery to remove her ovaries due to the growth. She will find out immediately after the surgery if the growth is cancerous. She talked to her liver doctor who says that things have improved with her liver such that she can have the surgery. The therapist notes that if Troi were drinking that she likely would not be able to attend to this medical situation at all and her liver functions would not be good.   Kelisha says that she has not drunk but admits that after being on her feet for hours that, due to her neuropathy, she has to take an Oxycodone. Darika did not attend the different AA meetings  as she previously planned with part of the issue being a lack of transportation as she lost her license due to a DWI and would have to spend a lot of money to get it reinstated, get a car and have a blow box installed.   The therapist and Adalai talk about what needs to transpire for her to be ready to stepdown from [facility]. Alyvia asks to be able to stay in the program up until she has her surgery if possible.   Progress Towards Goals: Progressing  Suicidal/Homicidal: No SI or HI  Plan: Will return to [facility] tomorrow.   Diagnosis: Alcohol Use Disorder, Severe; Cannabis Use Disorder, Severe; PTSD; and Tobacco Use Disorder  Collaboration of Care: Other N/A  Patient/Guardian was advised Release of Information must be obtained prior to any record release in order to collaborate their care with an outside provider. Patient/Guardian was advised if they have not already done so to contact the registration department to sign all necessary forms in order for [place] to release information regarding their care.   Consent: Patient/Guardian gives verbal consent for treatment and assignment of benefits for services provided during this visit. Patient/Guardian expressed understanding and agreed to proceed.   Myrna Blazer, MA, LCSW, [facility], LCAS 01/12/2023

## 2023-01-13 ENCOUNTER — Ambulatory Visit (INDEPENDENT_AMBULATORY_CARE_PROVIDER_SITE_OTHER): Payer: Medicaid Other | Admitting: Licensed Clinical Social Worker

## 2023-01-13 DIAGNOSIS — F1220 Cannabis dependence, uncomplicated: Secondary | ICD-10-CM

## 2023-01-13 DIAGNOSIS — F4312 Post-traumatic stress disorder, chronic: Secondary | ICD-10-CM

## 2023-01-13 DIAGNOSIS — F1020 Alcohol dependence, uncomplicated: Secondary | ICD-10-CM | POA: Diagnosis not present

## 2023-01-13 DIAGNOSIS — F17200 Nicotine dependence, unspecified, uncomplicated: Secondary | ICD-10-CM

## 2023-01-13 NOTE — Progress Notes (Signed)
Daily Group Progress Note   Program: [facility]     Individual Time: 9 a.m. to 12 p.m.    Type of Therapy: Process and Psychoeducational    Topic: The therapist checks in with group members, assesses for SI/HI/psychosis and overall level of functioning. The therapist inquires about sobriety date and number of community support meetings attended since last session.    The therapist facilitates discussions on co-dependency and how this relates to addiction and the differences between helping and enabling. The therapist talks about the role of "tough love" in relation to dealing with people with addiction.   The therapist discusses addiction as an inheritable, brain-based disease noting that everyone present in group today has family members with the disease of addiction which is not surprising.    Summary: [name] presents rating her depression as a "3" and her anxiety as an "6."  She says that she feels "valued" and "energetic" saying that her reason for this is that she feels "valued at work." [name] does not have much to say during today's check-in. She still does not have a Marketing executive.   When another women in group mentions a meeting she attending in which she had several women approach her about sponsoring her, the therapist recommends that [name] possibly attend this meeting with [name] expressing a willingness to do so.   Progress Towards Goals: [name] reports no drug or alcohol use   UDS collected: No Results: No   AA/NA attended?: Yes   Sponsor?: No   Bethany Blazer, MA, LCSW, [facility], LCAS 01/13/2023

## 2023-01-16 ENCOUNTER — Encounter (HOSPITAL_COMMUNITY): Payer: Self-pay | Admitting: Licensed Clinical Social Worker

## 2023-01-16 ENCOUNTER — Encounter (HOSPITAL_COMMUNITY): Payer: Self-pay | Admitting: Medical

## 2023-01-16 ENCOUNTER — Encounter (HOSPITAL_COMMUNITY): Payer: Self-pay

## 2023-01-16 ENCOUNTER — Telehealth (HOSPITAL_COMMUNITY): Payer: Self-pay | Admitting: Licensed Clinical Social Worker

## 2023-01-16 ENCOUNTER — Ambulatory Visit (HOSPITAL_COMMUNITY): Payer: Medicaid Other | Admitting: Licensed Clinical Social Worker

## 2023-01-16 NOTE — Telephone Encounter (Signed)
The therapist calls [name] confirming her identity via two identifiers. She says that she was not in group as she did not wake up until around 11 a.m. but plans on being in group on Wednesday.  [address] MA, LCSW, [facility], LCAS 01/16/2023

## 2023-01-17 ENCOUNTER — Other Ambulatory Visit: Payer: Self-pay | Admitting: Family Medicine

## 2023-01-17 ENCOUNTER — Other Ambulatory Visit: Payer: Self-pay | Admitting: Gastroenterology

## 2023-01-17 DIAGNOSIS — I10 Essential (primary) hypertension: Secondary | ICD-10-CM

## 2023-01-17 DIAGNOSIS — K219 Gastro-esophageal reflux disease without esophagitis: Secondary | ICD-10-CM

## 2023-01-17 NOTE — Telephone Encounter (Signed)
Requested Prescriptions  Pending Prescriptions Disp Refills   amLODipine (NORVASC) 5 MG tablet [Pharmacy Med Name: AMLODIPINE BESYLATE 5 MG TAB] 90 tablet 1    Sig: TAKE 1 TABLET (5 MG TOTAL) BY MOUTH DAILY.     Cardiovascular: Calcium Channel Blockers 2 Passed - 01/17/2023  3:07 PM      Passed - Last BP in normal range    BP Readings from Last 1 Encounters:  01/11/23 90/60         Passed - Last Heart Rate in normal range    Pulse Readings from Last 1 Encounters:  01/11/23 72         Passed - Valid encounter within last 6 months    Recent Outpatient Visits           2 months ago Elevated LFTs   [facility] [place], [place], [place]   11 months ago Hypokalemia   [facility] H[facility], [place]   1 year ago Left upper quadrant abdominal mass   [facility] Hoy Register, [place]   2 years ago Annual physical exam   [facility] H[facility], [facility]   2 years ago Essential hypertension   [facility] Hoy [facility], [facility]       Future Appointments             In 2 months Hoy Register, MD [facility]             omeprazole (PRILOSEC) 20 MG capsule [Pharmacy Med Name: OMEPRAZOLE DR 20 MG CAPSULE] 90 capsule 3    Sig: TAKE 1 CAPSULE BY MOUTH EVERY DAY     Gastroenterology: Proton Pump Inhibitors Passed - 01/17/2023  3:07 PM      Passed - Valid encounter within last 12 months    Recent Outpatient Visits           2 months ago Elevated LFTs   [facility] [place], [place], [place]   11 months ago Hypokalemia   [facility] Hoy Register, [place]   1 year ago Left upper quadrant abdominal mass   [place] [facility] Hoy Register, MD   2 years ago Annual physical exam   [place]  [facility] Hoy Register, [facility]   2 years ago Essential hypertension   [place] [facility] Hoy Register, MD       Future Appointments             In 2 months Hoy Register, MD [facility]

## 2023-01-18 ENCOUNTER — Ambulatory Visit (INDEPENDENT_AMBULATORY_CARE_PROVIDER_SITE_OTHER): Payer: Medicaid Other | Admitting: Licensed Clinical Social Worker

## 2023-01-18 DIAGNOSIS — F4312 Post-traumatic stress disorder, chronic: Secondary | ICD-10-CM

## 2023-01-18 DIAGNOSIS — F1020 Alcohol dependence, uncomplicated: Secondary | ICD-10-CM

## 2023-01-18 DIAGNOSIS — F1220 Cannabis dependence, uncomplicated: Secondary | ICD-10-CM

## 2023-01-18 DIAGNOSIS — F17200 Nicotine dependence, unspecified, uncomplicated: Secondary | ICD-10-CM

## 2023-01-18 NOTE — Progress Notes (Signed)
Daily Group Progress Note   Program: [facility]     Individual Time: 9 a.m. to 12 p.m.    Type of Therapy: Process and Psychoeducational    Topic: The therapist checks in with group members, assesses for SI/HI/psychosis and overall level of functioning. The therapist inquires about sobriety date and number of community support meetings attended since last session.    The therapist reviews the content and exercises covered in the previous group from the video, "Breaking the Addiction Cycle," as two members in attendance today missed group on Monday. The therapist focuses primarily on the topic of denial and the process by which persons actively in addition will "rebalance" the equation such that the positives of using seemingly outweigh the negatives. The therapist facilitates a discussion concerning how working the First Step is not just a mental process but that in doing so, behavioral change is implied. For example, one cannot say that he or she has accept powerlessness over drugs and alcohol while at the same time refusing to give up a stash, continuing to hang out with using friends, etcetera. The therapist explains the reason that group members are encouraged to start attending Twelve Step meetings and to get Sponsors while in [facility] as opposed to waiting until after discharge.   Summary: [name] presents rating her depression as a "4" and her anxiety as a "7."  She describes her mood as "content" and "excited." She is active in today's discussion; however, she does not as yet have a Marketing executive.She says that she will be attending two on-line meetings so may possibly be able to get a Sponsor at one of these.   The therapist observes that her lack of Sponsor remains a sticking point.   Progress Towards Goals: [name] reports no drug or alcohol use   UDS collected: Yes Results: No   AA/NA attended?: Yes   Sponsor?: No   Bethany Blazer, MA, LCSW, [facility], LCAS 01/18/2023

## 2023-01-19 NOTE — Telephone Encounter (Signed)
I do not know why this came to me.  She no longer needs this medicine from our office.  - HD

## 2023-01-20 ENCOUNTER — Ambulatory Visit (INDEPENDENT_AMBULATORY_CARE_PROVIDER_SITE_OTHER): Payer: Medicaid Other | Admitting: Licensed Clinical Social Worker

## 2023-01-20 DIAGNOSIS — F1220 Cannabis dependence, uncomplicated: Secondary | ICD-10-CM

## 2023-01-20 DIAGNOSIS — F1020 Alcohol dependence, uncomplicated: Secondary | ICD-10-CM | POA: Diagnosis not present

## 2023-01-20 DIAGNOSIS — F4312 Post-traumatic stress disorder, chronic: Secondary | ICD-10-CM | POA: Diagnosis not present

## 2023-01-20 DIAGNOSIS — F17200 Nicotine dependence, unspecified, uncomplicated: Secondary | ICD-10-CM

## 2023-01-20 NOTE — Progress Notes (Signed)
Daily Group Progress Note   Program: [facility]     Individual Time: 9 a.m. to 11:45 a.m.     Type of Therapy: Process and Psychoeducational    Topic: The therapist checks in with group members, assesses for SI/HI/psychosis and overall level of functioning. The therapist inquires about sobriety date and number of community support meetings attended since last session.    The therapist continues to show the video, "Breaking the Addiction Cycle" having group members complete the exercise concerning recounting one particular thing that caused them to see that their life had become unmanageable. The therapist facilitates a discussion on the personal use cycle giving group members the homework of detailing their use rituals.    Summary: [name] presents rating her depression as a "4" and her anxiety as a "6."  She describes her mood as "thankful" and "content." She still as yet does not have a Sponsor but puts it on her daily checklist as a goal to complete. She is active in today's discussion noting that she can identify with all that is discussed in the video concerning one's individual using cycle.   [name] has to leave group 15 minutes early today to get to work for a day game. She is to discharge from [facility] next week.   Progress Towards Goals: [name] reports no drug or alcohol use   UDS collected: No Results: No   AA/NA attended?: Yes   Sponsor?: No   Myrna Blazer, MA, LCSW, [facility], LCAS 01/20/2023

## 2023-01-21 ENCOUNTER — Other Ambulatory Visit: Payer: Self-pay | Admitting: Gastroenterology

## 2023-01-23 ENCOUNTER — Ambulatory Visit (INDEPENDENT_AMBULATORY_CARE_PROVIDER_SITE_OTHER): Payer: Medicaid Other | Admitting: Licensed Clinical Social Worker

## 2023-01-23 DIAGNOSIS — F4312 Post-traumatic stress disorder, chronic: Secondary | ICD-10-CM

## 2023-01-23 DIAGNOSIS — F1020 Alcohol dependence, uncomplicated: Secondary | ICD-10-CM | POA: Diagnosis not present

## 2023-01-23 DIAGNOSIS — F1220 Cannabis dependence, uncomplicated: Secondary | ICD-10-CM

## 2023-01-23 DIAGNOSIS — F17200 Nicotine dependence, unspecified, uncomplicated: Secondary | ICD-10-CM

## 2023-01-23 NOTE — Progress Notes (Signed)
Daily Group Progress Note   Program: [facility]     Individual Time: 9 a.m. to 12 p.m.    Type of Therapy: Process and Psychoeducational    Topic: The therapist checks in with group members, assesses for SI/HI/psychosis and overall level of functioning. The therapist inquires about sobriety date and number of community support meetings attended since last session.    The therapist has group members review their homework from last group in which they were to describe their using rituals and he shows the last portion of the video, "Breaking the Addiction Cycle" focusing on things people can do to avoid and interrupt these rituals in a way that reminds them that they are in recovery and no longer in active addiction. The therapist observes that some members have delayed in getting a Sponsor and attending meetings though apparently having no explanation as to why. The therapist notes that there is always a reason that people do or do not do things per behavioral therapy which states, "all behavior is purposeful;" however, people are not always aware of or willing to admit these reasons to themselves and/or others.     Summary: Latavia presents rating her depression as a "3" and her anxiety as a "6."  The therapist informs Imanni that her discharge date is this Friday, 01/27/23. Cameran still has no Marketing executive. The therapist observes that Mahkayla has not gotten a Marketing executive and did not follow through on going to different AA meetings on her week off sticking to the same meeting she has attended since the beginning of group. The therapist suggests that there may be more or a reason Modest has never gotten a Marketing executive than no one raising their hand at the one meeting encouraging her to figure out what this reason is. Koreena says that she will have a Sponsor by this Friday.  She says that her mood is "thankful" and "stressed" noting that she is stressed about her surgery next week as she will have to have a breathing tube.   Breyon  shares her using ritual in which she would smoke a joint on her way home from work to unwind and buy alcohol noting that she told herself that she was not an alcoholic as she did not drink in the morning as others she knows did. She says that she used to take a walk to a store near her house to buy alcohol telling herself that she was getting her exercise; however, she now walks a different way.   Progress Towards Goals: Vanda reports no drug or alcohol use   UDS collected: Yes Results: No   AA/NA attended?: Yes   Sponsor?: No   Myrna Blazer, MA, LCSW, [facility], LCAS 01/23/2023

## 2023-01-25 ENCOUNTER — Ambulatory Visit (INDEPENDENT_AMBULATORY_CARE_PROVIDER_SITE_OTHER): Payer: Medicaid Other | Admitting: Licensed Clinical Social Worker

## 2023-01-25 DIAGNOSIS — F1220 Cannabis dependence, uncomplicated: Secondary | ICD-10-CM

## 2023-01-25 DIAGNOSIS — F17200 Nicotine dependence, unspecified, uncomplicated: Secondary | ICD-10-CM

## 2023-01-25 DIAGNOSIS — F1020 Alcohol dependence, uncomplicated: Secondary | ICD-10-CM

## 2023-01-25 DIAGNOSIS — F4312 Post-traumatic stress disorder, chronic: Secondary | ICD-10-CM | POA: Diagnosis not present

## 2023-01-25 NOTE — Progress Notes (Signed)
Daily Group Progress Note   Program: CD IOP     Individual Time: 9 a.m. to 12 p.m.    Type of Therapy: Process and Psychoeducational    Topic: The therapist checks in with group members, assesses for SI/HI/psychosis and overall level of functioning. The therapist inquires about sobriety date and number of community support meetings attended since last session.    The therapist presents information on and facilitates discussion about a number of topics today. He defines for group members what a standard drink is and what constitutes a binge episode for a female and for a female based on the number of standard drinks consumed. The therapist notes that it is not uncommon that people with addiction or alcoholism have friends and partners who are actively in addiction; however, while they may come to recognize it in themselves, they are often blind to it in those with whom they associate. The therapist shows a video on the three stages of relapse emphasizing that emotional relapse equates with poor self-care. The therapist explains what good self-care looks like both from a cognitive and a behavioral standpoint. He educates group members on caffeine intoxication and how keeping caffeine consumption in moderation and stopping after noon constitutes self-care. He educates group members on the old, AA suggestion about eating a piece of candy when one is trying to curb a craving to drink and how many people have severe sugar cravings early in their sobriety. The therapist notes that people in recovery can reach out to their non-addict friends and loved ones; however, the added benefit of reaching out to other addicts in recovery is that they are more able to understand what they are going through. He educates the group on the concept of relationship addiction. He also discusses the importance of paying attention to one's emotions or feelings in addition to one's thoughts. He states his opinion that people will share  their thoughts with acquaintances; however, will only share their feelings with people they trust as doing so involves a deeper level of intimacy. The therapist explains the reason that people in active addiction remain emotionally immature and how a person in a recovery quickly starts to outgrow this old relationships. Lastly, the therapist educates group members on the use of light therapist in relation to Seasonal Affective Disorder.    Summary: Marieann presents rating her depression as a "4" and her anxiety as a "6."  She says that she feels "content" and "inspired." When another group member talks about having a sister who does not validate her, Shawntai says that she can identify as she has a sister just like this.  Velina shares that she now has a Marketing executive. 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 She says that oddly enough this woman sat down next to [place] and started talking to her. When Romulus asked her to be her Sponsor vs just her temporary Sponsor, she had a very different experience in that the woman has been proactive in setting up a time to meet and has discussed her plans to have Paullina start working steps.   The therapist and others in group congratulate Devette for having accomplished the goal of getting a Marketing executive. Mickela is to graduate group at the next group 01/27/23.   Progress Towards Goals: Antinique reports no drug or alcohol use   UDS collected: No Results: No   AA/NA attended?: Yes   Sponsor?: Yes   Myrna Blazer, MA,  LCSW, [facility], LCAS 01/25/2023

## 2023-01-26 ENCOUNTER — Other Ambulatory Visit (HOSPITAL_COMMUNITY): Payer: Self-pay | Admitting: Medical

## 2023-01-26 NOTE — Telephone Encounter (Signed)
Pt has Cirrhosis Cymbalta contraindicated

## 2023-01-27 ENCOUNTER — Ambulatory Visit (HOSPITAL_COMMUNITY): Payer: Medicaid Other | Admitting: Licensed Clinical Social Worker

## 2023-01-27 DIAGNOSIS — F1110 Opioid abuse, uncomplicated: Secondary | ICD-10-CM

## 2023-01-27 DIAGNOSIS — F341 Dysthymic disorder: Secondary | ICD-10-CM

## 2023-01-27 DIAGNOSIS — T50905D Adverse effect of unspecified drugs, medicaments and biological substances, subsequent encounter: Secondary | ICD-10-CM

## 2023-01-27 DIAGNOSIS — F4321 Adjustment disorder with depressed mood: Secondary | ICD-10-CM

## 2023-01-27 DIAGNOSIS — K7030 Alcoholic cirrhosis of liver without ascites: Secondary | ICD-10-CM

## 2023-01-27 DIAGNOSIS — F17200 Nicotine dependence, unspecified, uncomplicated: Secondary | ICD-10-CM

## 2023-01-27 DIAGNOSIS — Z811 Family history of alcohol abuse and dependence: Secondary | ICD-10-CM

## 2023-01-27 DIAGNOSIS — F419 Anxiety disorder, unspecified: Secondary | ICD-10-CM

## 2023-01-27 DIAGNOSIS — N838 Other noninflammatory disorders of ovary, fallopian tube and broad ligament: Secondary | ICD-10-CM

## 2023-01-27 DIAGNOSIS — G621 Alcoholic polyneuropathy: Secondary | ICD-10-CM

## 2023-01-27 DIAGNOSIS — F1220 Cannabis dependence, uncomplicated: Secondary | ICD-10-CM

## 2023-01-27 DIAGNOSIS — F1020 Alcohol dependence, uncomplicated: Secondary | ICD-10-CM

## 2023-01-27 DIAGNOSIS — T7432XS Child psychological abuse, confirmed, sequela: Secondary | ICD-10-CM

## 2023-01-27 DIAGNOSIS — Z8679 Personal history of other diseases of the circulatory system: Secondary | ICD-10-CM

## 2023-01-27 DIAGNOSIS — G894 Chronic pain syndrome: Secondary | ICD-10-CM

## 2023-01-27 DIAGNOSIS — T7491XS Unspecified adult maltreatment, confirmed, sequela: Secondary | ICD-10-CM

## 2023-01-27 DIAGNOSIS — F4312 Post-traumatic stress disorder, chronic: Secondary | ICD-10-CM

## 2023-01-27 MED ORDER — NALTREXONE HCL 50 MG PO TABS: 50.0000 mg | ORAL_TABLET | Freq: Every day | ORAL | 0 refills | Status: DC

## 2023-01-27 NOTE — Progress Notes (Unsigned)
Daily Group Progress Note   Program: CD IOP     Individual Time: 9 a.m. to 12 p.m.    Type of Therapy: Process and Psychoeducational    Topic: The therapist checks in with group members, assesses for SI/HI/psychosis and overall level of functioning. The therapist inquires about sobriety date and number of community support meetings attended since last session.   The therapist facilitates discussions on the differences between helping and enabling, the reason that the Twelve Step program is one of attraction and not problem, why to avoid the "rescue fantasy," and the importance of being assertive and being able to ask for what one needs in relation to recovery. The therapist answers questions regarding the possible reasons a person could be involuntarily discharged from CD IOP.   The therapist says that the key takeaway for today's meeting should be that being sober is not enough. The therapist discusses how being counter-dependent is not less unhealthy than being dependent and that healthy is somewhere between the middle of the two extremes.    Summary: [name] presents rating her depression as a "3" and her anxiety as a "6."  As it is [name]'s last day in CD IOP, she says that she would like to check-in last; however, at the conclusion to group, there is little time left and [name] notes that she and another group member need to make a meeting.  [name] does share that she found the group beneficial and that the things that standout are that there are other people like her and that recovery is possible. She says that she tried quitting on her own but knows that she cannot do it by herself.  She stresses the importance of talking to other people with the disease of addiction saying that non-addicts will tell you that they understand but they do not.   [name] says that her plan is to continue meetings and to work with her Sponsor. She says that she will call to schedule with this therapist after she has  her surgery next week as she will not be able to go anywhere or do anything for a week or two after this surgery.   Progress Towards Goals: [name] reports no drug or alcohol use   UDS collected: No Results: Yes, negative for drugs and alcohol   AA/NA attended?: Yes   Sponsor?: Yes   Myrna Blazer, MA, LCSW, [facility], LCAS 01/27/2023
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
## 2023-01-30 ENCOUNTER — Encounter (HOSPITAL_COMMUNITY): Payer: Self-pay

## 2023-01-30 ENCOUNTER — Ambulatory Visit (HOSPITAL_COMMUNITY): Payer: Medicaid Other

## 2023-01-31 ENCOUNTER — Encounter: Payer: Self-pay | Admitting: Gastroenterology

## 2023-02-01 ENCOUNTER — Ambulatory Visit (HOSPITAL_COMMUNITY): Payer: Medicaid Other

## 2023-02-01 ENCOUNTER — Encounter (HOSPITAL_COMMUNITY): Payer: Self-pay | Admitting: Licensed Clinical Social Worker

## 2023-02-03 ENCOUNTER — Ambulatory Visit (HOSPITAL_COMMUNITY): Payer: Medicaid Other

## 2023-02-10 ENCOUNTER — Telehealth: Payer: Self-pay | Admitting: Surgery

## 2023-02-10 ENCOUNTER — Encounter: Payer: Self-pay | Admitting: Surgery

## 2023-02-10 ENCOUNTER — Encounter: Payer: Self-pay | Admitting: Gynecologic Oncology

## 2023-02-10 NOTE — Telephone Encounter (Signed)
Patient called requesting a return to work note for 6/4. 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 She states she is feeling good, denies any pain at this time. Advised patient that Dr Alvester Morin would be notified of her request. Patient requests return to work note be sent to her email. [email or web address] No other concerns at this time.

## 2023-02-10 NOTE — Telephone Encounter (Signed)
Return to work note sent to patient's email as requested.

## 2023-02-10 NOTE — Telephone Encounter (Signed)
Called to let pt know of post op scheduled for 6/17 at 3:45pm. VM box full, unable to leave message. Mychart message sent as well.

## 2023-02-27 ENCOUNTER — Inpatient Hospital Stay: Payer: Medicaid Other | Attending: Psychiatry | Admitting: Psychiatry

## 2023-02-27 ENCOUNTER — Other Ambulatory Visit: Payer: Self-pay

## 2023-02-27 ENCOUNTER — Other Ambulatory Visit (HOSPITAL_COMMUNITY): Payer: Self-pay | Admitting: Medical

## 2023-02-27 VITALS — BP 138/75 | HR 56 | Temp 98.4°F | Ht 64.96 in | Wt 134.6 lb

## 2023-02-27 DIAGNOSIS — D270 Benign neoplasm of right ovary: Secondary | ICD-10-CM

## 2023-02-27 DIAGNOSIS — Z90722 Acquired absence of ovaries, bilateral: Secondary | ICD-10-CM

## 2023-02-27 DIAGNOSIS — F4312 Post-traumatic stress disorder, chronic: Secondary | ICD-10-CM

## 2023-02-27 DIAGNOSIS — D271 Benign neoplasm of left ovary: Secondary | ICD-10-CM

## 2023-02-27 DIAGNOSIS — N9489 Other specified conditions associated with female genital organs and menstrual cycle: Secondary | ICD-10-CM

## 2023-02-27 NOTE — Patient Instructions (Signed)
It was a pleasure to see you in clinic today. - Restrictions lifted. Reintroduce normal activities gradual. - Okay to return to routine care.   Thank you very much for allowing me to provide care for you today.  I appreciate your confidence in choosing our Gynecologic Oncology team at [facility].  If you have any questions about your visit today please call our office or send [place] a MyChart message and we will get back to you as soon as possible.

## 2023-02-27 NOTE — Progress Notes (Signed)
Gynecologic Oncology Return Clinic Visit  Date of Service: 02/27/2023 Referring Provider: Hoy Register, MD [address] [place] [address] [place],  [place] [address]  Assessment & Plan: [name] is a 52 y.o. woman with bilateral adnexal masses who is s/p RA-BSO on 02/02/23, benign final pathology.  Postop: - Pt recovering well from surgery and healing appropriately postoperatively - Intraoperative findings and pathology results reviewed. - Ongoing postoperative expectations and precautions reviewed. Restrictions lifted. Okay to reintroduce normal activities gradually. - Okay to return to routine care.   RTC prn.  Clide Cliff, MD Gynecologic Oncology   ----------------------- Reason for Visit: Postop   Interval History: Pt reports that she is recovering well from surgery. She is not needing anythign for pain. She is eating and drinking well. She is voiding without issue and having regular bowel movements.     Past Medical/Surgical History: Past Medical History:  Diagnosis Date   Anxiety    Arthritis    right foot   Cirrhosis (HCC)    GERD (gastroesophageal reflux disease)    History of alcoholism (HCC)    History of cellulitis    2015  right side face   Hypertension    followed by pcp  (per pt never had stress test)   Malnutrition (HCC)    Metatarsal fracture    right 2nd   Ovarian cyst    Peripheral neuropathy    feet   Strachan's syndrome    painful neuropathy   Wears dentures    upper   Wears glasses     Past Surgical History:  Procedure Laterality Date   BREAST BIOPSY Right 2023   MULTIPLE EXTRACTIONS WITH ALVEOLOPLASTY N/A 12/25/2013   Procedure: Extraction of tooth #'s 2,3,4,5,6,8,9,10,11,12,13,14,15,20,21,22,23,24,25,26,27, 63,87,56,43 with alveoloplasty and bilateral mandibular tori reductions;  Surgeon: Charlynne Pander, DDS;  Location: [facility] OR;  Service: Oral Surgery;  Laterality: N/A;   ORIF TOE FRACTURE Right 07/03/2019   Procedure: OPEN  REDUCTION INTERNAL FIXATION (ORIF) METATARSAL (TOE) FRACTURE;  Surgeon: Vivi Barrack, [name];  Location: [facility] [place];  Service: Podiatry;  Laterality: Right;  LEG BLOCK   SURAL NERVE BX Right 12/24/2013   Procedure: SURAL NERVE BIOPSY;  Surgeon: Barnett Abu, MD;  Location: [facility];  Service: Neurosurgery;  Laterality: Right;   TUBAL LIGATION Bilateral 2002   PPTL    Family History  Adopted: Yes  Problem Relation Age of Onset   Hypertension Mother    Raynaud syndrome Mother    Diabetes Father    Drug abuse Maternal Grandmother    Cancer Maternal Grandmother        intestinal   Hypertension Maternal Grandmother    Lung cancer Maternal Grandfather    Hypertension Paternal Grandfather    Diabetes Maternal Aunt    Heart disease Maternal Aunt    Diabetes Maternal Uncle    Heart disease Maternal Uncle    Colon cancer Neg Hx    Colon polyps Neg Hx    Esophageal cancer Neg Hx    Rectal cancer Neg Hx    Stomach cancer Neg Hx    Breast cancer Neg Hx    Ovarian cancer Neg Hx    Endometrial cancer Neg Hx     Social History   Socioeconomic History   Marital status: Widowed    Spouse name: Not on file   Number of children: 1   Years of education: hs   Highest education level: Not on file  Occupational History   Occupation:  food service  Tobacco Use   Smoking status: Every Day    Packs/day: 0.50    Years: 20.00    Additional pack years: 0.00    Total pack years: 10.00    Types: Cigarettes   Smokeless tobacco: Never  Vaping Use   Vaping Use: Never used  Substance and Sexual Activity   Alcohol use: Not Currently    Comment: 07-02-2019-- per pt 4 drinks per day   Drug use: Not Currently    Comment: 07-02-2019  -- per pt last used in age 46s   Sexual activity: Yes    Partners: Male    Birth control/protection: Surgical  Other Topics Concern   Not on file  Social History Narrative   patient is widowed.  Patient lives with daughter in a 2 story home.     Patient works in Personnel officer.    Education high school.   Right handed.   Caffeine sometimes tea.   Lives in a one story home   Social Determinants of Health   Financial Resource Strain: Not on file  Food Insecurity: Not on file  Transportation Needs: Not on file  Physical Activity: Not on file  Stress: Not on file  Social Connections: Not on file    Current Medications:  Current Outpatient Medications:    amLODipine (NORVASC) 5 MG tablet, TAKE 1 TABLET (5 MG TOTAL) BY MOUTH DAILY., Disp: 90 tablet, Rfl: 1   atorvastatin (LIPITOR) 10 MG tablet, Take 1 tablet (10 mg total) by mouth daily., Disp: 90 tablet, Rfl: 3   baclofen (LIORESAL) 10 MG tablet, Take 1 tablet (10 mg total) by mouth 3 (three) times daily., Disp: 270 tablet, Rfl: 0   buPROPion (WELLBUTRIN SR) 200 MG 12 hr tablet, Take 1 tablet (200 mg total) by mouth 2 (two) times daily., Disp: 60 tablet, Rfl: 2   busPIRone (BUSPAR) 30 MG tablet, Take 1 tablet (30 mg total) by mouth 2 (two) times daily., Disp: 60 tablet, Rfl: 2   ibuprofen (ADVIL) 600 MG tablet, Take 600 mg by mouth every 8 (eight) hours as needed. Pt states only twice daily as needed, Disp: , Rfl:    Multiple Vitamins-Minerals (MULTIVITAMIN WITH MINERALS) tablet, Take 1 tablet by mouth daily., Disp: , Rfl:    omeprazole (PRILOSEC) 20 MG capsule, TAKE 1 CAPSULE BY MOUTH EVERY DAY, Disp: 90 capsule, Rfl: 3   potassium chloride SA (KLOR-CON M) 20 MEQ tablet, Take 2 tablets (40 mEq total) by mouth daily., Disp: 30 tablet, Rfl: 0   pregabalin (LYRICA) 150 MG capsule, Take 1 capsule (150 mg total) by mouth in the morning, at noon, in the evening, and at bedtime., Disp: 120 capsule, Rfl: 1   traZODone (DESYREL) 100 MG tablet, TAKE 1 TO 1.5 TABLETS BY MOUTH DAILY AT BEDTIME AS NEEDED FOR SLEEP., Disp: 45 tablet, Rfl: 0  Review of Symptoms: Complete 10-system review is positive for: baseline neuropathy, anxiety  Physical Exam: BP 138/75 (BP Location: Left Arm, Patient  Position: Sitting)   Pulse (!) 56   Temp 98.4 F (36.9 C) (Oral)   Ht 5' 4.96" (1.65 m)   Wt 134 lb 9.6 oz (61.1 kg)   LMP  (LMP Unknown)   SpO2 99%   BMI 22.43 kg/m  General: Alert, oriented, no acute distress. HEENT: Normocephalic, atraumatic. Neck symmetric without masses. Sclera anicteric.  Chest: Normal work of breathing. Clear to auscultation bilaterally.   Cardiovascular: Regular rate and rhythm, no murmurs. Abdomen: Soft, nontender.  Normoactive bowel  sounds.  No masses appreciated.  Well-healing incisions. Extremities: Grossly normal range of motion.  Warm, well perfused.  No edema bilaterally. Skin: No rashes or lesions noted.   Laboratory & Radiologic Studies: Diagnosis   A: Ovary and fallopian tube, right, salpingo-oophorectomy Ovary - Fragmented serous cystadenofibroma, 5.5 cm in greatest aggregate dimension - Background epithelial inclusion cysts and physiologic changes - Infarcted appendix epiploica with calcifications - No atypia or malignancy identified   Fallopian tube - Benign fallopian tube with paratubal cystic [place] nests   B: Ovary and fallopian tube, left, salpingo-oophorectomy Ovary - Serous cystadenofibroma, 0.8 cm in greatest dimension - Background epithelial inclusion cysts, corpus luteum, and physiologic changes - No atypia or malignancy identified   Fallopian tube - Benign fallopian tube with hydrosalpinx, likely due to Filshie clip - Paratubal serous cyst and cystic [place] nests

## 2023-03-02 NOTE — Telephone Encounter (Signed)
Ok for 1 refillS/P discharge from CD IOP

## 2023-03-05 ENCOUNTER — Encounter: Payer: Self-pay | Admitting: Psychiatry

## 2023-03-11 ENCOUNTER — Other Ambulatory Visit (HOSPITAL_COMMUNITY): Payer: Self-pay | Admitting: Medical

## 2023-03-13 ENCOUNTER — Telehealth: Payer: Self-pay | Admitting: Gastroenterology

## 2023-03-13 ENCOUNTER — Other Ambulatory Visit (HOSPITAL_COMMUNITY): Payer: Self-pay | Admitting: Medical

## 2023-03-13 ENCOUNTER — Telehealth (HOSPITAL_COMMUNITY): Payer: Self-pay

## 2023-03-13 MED ORDER — BUPROPION HCL ER (SR) 200 MG PO TB12: 200.0000 mg | ORAL_TABLET | Freq: Two times a day (BID) | ORAL | 2 refills | Status: DC

## 2023-03-13 NOTE — Telephone Encounter (Signed)
PT is returning call about appt.  Please advise.

## 2023-03-13 NOTE — Telephone Encounter (Signed)
Fax received from patients pharmacy requesting a refill on Buproprion - Please review and advise, thank you

## 2023-03-13 NOTE — Progress Notes (Signed)
Refill request

## 2023-03-15 ENCOUNTER — Ambulatory Visit: Payer: MEDICAID

## 2023-03-15 NOTE — Telephone Encounter (Signed)
Left a voicemail letting her know I see is scheduled for everything already and if she had any additional question to please reach out.   Communication from self for recall [place] and Hep A injection and labs.  Alexis Frock, CMA See office note 01-11-2023 Dr Myrtie Neither Needs hep A injection, lab and [place]

## 2023-03-20 ENCOUNTER — Encounter: Payer: Self-pay | Admitting: Family Medicine

## 2023-03-20 ENCOUNTER — Other Ambulatory Visit: Payer: Self-pay | Admitting: Family Medicine

## 2023-03-20 ENCOUNTER — Ambulatory Visit: Payer: MEDICAID | Attending: Family Medicine | Admitting: Family Medicine

## 2023-03-20 ENCOUNTER — Ambulatory Visit: Payer: Medicaid Other | Admitting: Family Medicine

## 2023-03-20 ENCOUNTER — Other Ambulatory Visit: Payer: Self-pay | Admitting: Neurology

## 2023-03-20 VITALS — BP 135/73 | HR 57 | Temp 98.1°F | Ht 64.0 in | Wt 134.0 lb

## 2023-03-20 DIAGNOSIS — Z23 Encounter for immunization: Secondary | ICD-10-CM

## 2023-03-20 DIAGNOSIS — F419 Anxiety disorder, unspecified: Secondary | ICD-10-CM | POA: Diagnosis not present

## 2023-03-20 DIAGNOSIS — F4312 Post-traumatic stress disorder, chronic: Secondary | ICD-10-CM

## 2023-03-20 DIAGNOSIS — K219 Gastro-esophageal reflux disease without esophagitis: Secondary | ICD-10-CM

## 2023-03-20 DIAGNOSIS — L308 Other specified dermatitis: Secondary | ICD-10-CM

## 2023-03-20 DIAGNOSIS — K7030 Alcoholic cirrhosis of liver without ascites: Secondary | ICD-10-CM

## 2023-03-20 DIAGNOSIS — H53009 Unspecified amblyopia, unspecified eye: Secondary | ICD-10-CM

## 2023-03-20 DIAGNOSIS — F32A Depression, unspecified: Secondary | ICD-10-CM

## 2023-03-20 DIAGNOSIS — I10 Essential (primary) hypertension: Secondary | ICD-10-CM | POA: Diagnosis not present

## 2023-03-20 DIAGNOSIS — M792 Neuralgia and neuritis, unspecified: Secondary | ICD-10-CM

## 2023-03-20 DIAGNOSIS — E782 Mixed hyperlipidemia: Secondary | ICD-10-CM

## 2023-03-20 DIAGNOSIS — G4709 Other insomnia: Secondary | ICD-10-CM

## 2023-03-20 MED ORDER — TRAZODONE HCL 100 MG PO TABS
100.0000 mg | ORAL_TABLET | Freq: Every day | ORAL | 1 refills | Status: DC
Start: 2023-03-20 — End: 2023-06-01

## 2023-03-20 MED ORDER — ATORVASTATIN CALCIUM 10 MG PO TABS: 10.0000 mg | ORAL_TABLET | Freq: Every day | ORAL | 1 refills | Status: DC

## 2023-03-20 MED ORDER — FLUOXETINE HCL 20 MG PO TABS
20.0000 mg | ORAL_TABLET | Freq: Every day | ORAL | 1 refills | Status: DC
Start: 2023-03-20 — End: 2023-05-25

## 2023-03-20 MED ORDER — BUPROPION HCL ER (SR) 200 MG PO TB12: 200.0000 mg | ORAL_TABLET | Freq: Two times a day (BID) | ORAL | 1 refills | Status: DC

## 2023-03-20 MED ORDER — BUSPIRONE HCL 30 MG PO TABS
30.0000 mg | ORAL_TABLET | Freq: Two times a day (BID) | ORAL | 1 refills | Status: DC
Start: 2023-03-20 — End: 2023-08-24

## 2023-03-20 MED ORDER — OMEPRAZOLE 20 MG PO CPDR: 20.0000 mg | DELAYED_RELEASE_CAPSULE | Freq: Every day | ORAL | 1 refills | Status: DC

## 2023-03-20 MED ORDER — AMLODIPINE BESYLATE 5 MG PO TABS: 5.0000 mg | ORAL_TABLET | Freq: Every day | ORAL | 1 refills | Status: DC

## 2023-03-20 NOTE — Patient Instructions (Signed)
Cirrhosis  Cirrhosis is long-term (chronic) liver injury. The liver is the body's largest internal organ, and it performs many functions. It converts food into energy, removes toxic material from the blood, makes important proteins, and absorbs necessary vitamins from food. In cirrhosis, healthy liver cells are replaced by scar tissue. This prevents blood from flowing through the liver and makes it difficult for the liver to complete its functions. What are the causes? Common causes of this condition are hepatitis C and long-term alcohol abuse. Other causes include: Nonalcoholic fatty liver disease (NAFLD). This happens when fat is deposited in the liver by causes other than alcohol. Hepatitis B infection. Autoimmune hepatitis. In this condition, the body's defense system (immune system) mistakenly attacks the liver cells, causing inflammation. Diseases that cause blockage of ducts inside the liver. Inherited liver diseases, such as hemochromatosis. This is one of the most common inherited liver diseases. In this disease, deposits of iron collect in the liver and other organs. Reactions to certain long-term medicines, such as amiodarone, a heart medicine. Parasitic infections. These include schistosomiasis, which is caused by a flatworm. Long-term contact to certain toxins. These toxins include certain organic solvents, such as toluene and chloroform. What increases the risk? You are more likely to develop this condition if: You have certain types of viral hepatitis. You abuse alcohol, especially if you are female. You are overweight. You use IV drugs and share needles. You have unprotected sex with someone who has viral hepatitis. What are the signs or symptoms? You may not have any signs and symptoms at first. Symptoms may not develop until the damage to your liver starts to get worse. Early symptoms may include: Weakness and tiredness (fatigue). Changes in sleep patterns or having trouble  sleeping. Itchiness. Tenderness in the right-upper part of your abdomen. Weight loss and muscle loss. Nausea. Loss of appetite. Later symptoms may include: Fatigue or weakness that is getting worse. Yellow skin and eyes (jaundice). Buildup of fluid in the abdomen (ascites). You may notice that your clothes are tight around your waist. Weight gain and swelling of the feet and ankles (edema). Trouble breathing. Easy bruising and bleeding. Vomiting blood, or black or bloody stool. Mental confusion. How is this diagnosed? Your health care provider may suspect cirrhosis based on your symptoms and medical history, especially if you have other medical conditions or a history of alcohol abuse. Your health care provider will do a physical exam to feel your liver and to check for signs of cirrhosis. Tests may include: Blood tests to check: For hepatitis B or C. Kidney function. Liver function. Imaging tests such as: MRI or CT scan to look for changes seen in advanced cirrhosis. Ultrasound to see if normal liver tissue is being replaced by scar tissue. A procedure in which a long needle is used to take a sample of liver tissue to be checked in a lab (biopsy). Liver biopsy can confirm the diagnosis of cirrhosis. How is this treated? Treatment for this condition depends on how damaged your liver is and what caused the damage. It may include treating the symptoms of cirrhosis, or treating the underlying causes to slow the damage. Treatment may include: Making lifestyle changes, such as: Eating a healthy diet. You may need to work with your health care provider or a dietitian to develop an eating plan. Restricting salt intake. Maintaining a healthy weight. Not abusing drugs or alcohol. Taking medicines to: Treat liver infections or other infections. Control itching. Reduce fluid buildup. Reduce certain blood   toxins. Reduce risk of bleeding from enlarged blood vessels in the stomach or esophagus  (varices). Liver transplant. In this procedure, a liver from a donor is used to replace your diseased liver. This is done if cirrhosis has caused liver failure. Other treatments and procedures may be done depending on the problems that you get from cirrhosis. Common problems include liver-related kidney failure (hepatorenal syndrome). Follow these instructions at home:  Take medicines only as told by your health care provider. Do not use medicines that are toxic to your liver. Ask your health care provider before taking any new medicines, including over-the-counter medicines such as NSAIDs. Rest as needed. Eat a well-balanced diet. Limit your salt or water intake, if your health care provider asks you to do this. Do not drink alcohol. This is especially important if you routinely take acetaminophen. Keep all follow-up visits. This is important. Contact a health care provider if you: Have fatigue or weakness that is getting worse. Develop swelling of the hands, feet, or legs, or a buildup of fluid in the abdomen (ascites). Have a fever or chills. Develop loss of appetite. Have nausea or vomiting. Develop jaundice. Develop easy bruising or bleeding. Get help right away if you: Vomit bright red blood or a material that looks like coffee grounds. Have blood in your stools. Notice that your stools appear black and tarry. Become confused. Have chest pain or trouble breathing. These symptoms may represent a serious problem that is an emergency. Do not wait to see if the symptoms will go away. Get medical help right away. Call your local emergency services (911 in the [place] Do not drive yourself to the hospital. Summary Cirrhosis is chronic liver injury. Common causes are hepatitis C and long-term alcohol abuse. Tests used to diagnose cirrhosis include blood tests, imaging tests, and liver biopsy. Treatment for this condition involves treating the underlying cause. Avoid alcohol, drugs, salt,  and medicines that may damage your liver. Get help right away if you vomit bright red blood or a material that looks like coffee grounds. This information is not intended to replace advice given to you by your health care provider. Make sure you discuss any questions you have with your health care provider. Document Revised: 06/11/2020 Document Reviewed: 06/11/2020 Elsevier Patient Education  2024 Elsevier Inc.  

## 2023-03-20 NOTE — Progress Notes (Signed)
Subjective:  Patient ID: [name], female    DOB: [birth year]  Age: 52 y.o. MRN: [id number]  CC: Hypertension   HPI [name] is a 52 y.o. 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  She underwent robotic assisted bilateral salpingo-oophorectomy in 01/2023 for adnexal mass with pathology benign.  She has followed up with GYN oncology postprocedure.  Interval History: Discussed the use of AI scribe software for clinical note transcription with the patient, who gave verbal consent to proceed.  She presents for a follow-up visit. She reports that she has been off Cymbalta due to concerns about liver health and has been managing her anxiety with buspirone, which she describes as "kinda" effective. She also takes Wellbutrin for depression and Lyrica for neuropathy. The patient has recently stopped drinking alcohol after discharge from alcohol rehab and is seeking advice on dietary changes to support liver health.  Her lipid panel was abnormal 3 months ago and at that time she had been off her statin due to her elevated liver enzymes but these normalized and she was subsequently restarted on her statin.  She expresses concerns about her potassium levels, noting that she has been off her potassium supplement for about a month. The patient also mentions that she has been experiencing some nerve pain, which she believes has worsened since stopping Cymbalta. She reports that her Lyrica dosage was increased to four times a day to manage this pain, but she has been taking it three times a day as per her pharmacy instructions.        Past Medical History:  Diagnosis Date   Anxiety    Arthritis    right foot   Cirrhosis (HCC)    GERD (gastroesophageal reflux disease)    History of alcoholism (HCC)    History of cellulitis    2015  right side  face   Hypertension    followed by pcp  (per pt never had stress test)   Malnutrition (HCC)    Metatarsal fracture    right 2nd   Ovarian cyst    Peripheral neuropathy    feet   Strachan's syndrome    painful neuropathy   Wears dentures    upper   Wears glasses     Past Surgical History:  Procedure Laterality Date   BREAST BIOPSY Right 2023   MULTIPLE EXTRACTIONS WITH ALVEOLOPLASTY N/A 12/25/2013   Procedure: Extraction of tooth #'s 2,3,4,5,6,8,9,10,11,12,13,14,15,20,21,22,23,24,25,26,27, 40,98,11,91 with alveoloplasty and bilateral mandibular tori reductions;  Surgeon: Charlynne Pander, DDS;  Location: [facility] OR;  Service: Oral Surgery;  Laterality: N/A;   ORIF TOE FRACTURE Right 07/03/2019   Procedure: OPEN REDUCTION INTERNAL FIXATION (ORIF) METATARSAL (TOE) FRACTURE;  Surgeon: Vivi Barrack, [name];  Location: [facility];  Service: Podiatry;  Laterality: Right;  LEG BLOCK   SURAL NERVE BX Right 12/24/2013   Procedure: SURAL NERVE BIOPSY;  Surgeon: Barnett Abu, MD;  Location: [facility];  Service: Neurosurgery;  Laterality: Right;   TUBAL LIGATION Bilateral 2002   PPTL    Family History  Adopted: Yes  Problem Relation Age of Onset   Hypertension Mother    Raynaud syndrome Mother    Diabetes Father    Drug abuse Maternal Grandmother    Cancer Maternal Grandmother        intestinal   Hypertension Maternal Grandmother    Lung cancer Maternal Grandfather  Hypertension Paternal Grandfather    Diabetes Maternal Aunt    Heart disease Maternal Aunt    Diabetes Maternal Uncle    Heart disease Maternal Uncle    Colon cancer Neg Hx    Colon polyps Neg Hx    Esophageal cancer Neg Hx    Rectal cancer Neg Hx    Stomach cancer Neg Hx    Breast cancer Neg Hx    Ovarian cancer Neg Hx    Endometrial cancer Neg Hx     Social History   Socioeconomic History   Marital status: Widowed    Spouse name: Not on file   Number of children: 1   Years of  education: hs   Highest education level: Not on file  Occupational History   Occupation: food service  Tobacco Use   Smoking status: Every Day    Packs/day: 0.50    Years: 20.00    Additional pack years: 0.00    Total pack years: 10.00    Types: Cigarettes   Smokeless tobacco: Never  Vaping Use   Vaping Use: Never used  Substance and Sexual Activity   Alcohol use: Not Currently    Comment: 07-02-2019-- per pt 4 drinks per day   Drug use: Not Currently    Comment: 07-02-2019  -- per pt last used in age 21s   Sexual activity: Yes    Partners: Male    Birth control/protection: Surgical  Other Topics Concern   Not on file  Social History Narrative   patient is widowed.  Patient lives with daughter in a 2 story home.    Patient works in Personnel officer.    Education high school.   Right handed.   Caffeine sometimes tea.   Lives in a one story home   Social Determinants of Health   Financial Resource Strain: Not on file  Food Insecurity: Not on file  Transportation Needs: Not on file  Physical Activity: Not on file  Stress: Not on file  Social Connections: Not on file    Allergies  Allergen Reactions   Cymbalta [Duloxetine Hcl] Other (See Comments)    Cirrhosis Pt on 6 week taper   Macrobid [Nitrofurantoin Macrocrystal] Anaphylaxis and Other (See Comments)    Patient reports 2 syncope episodes after her mouth became numb    Outpatient Medications Prior to Visit  Medication Sig Dispense Refill   ibuprofen (ADVIL) 600 MG tablet Take 600 mg by mouth every 8 (eight) hours as needed. Pt states only twice daily as needed     Multiple Vitamins-Minerals (MULTIVITAMIN WITH MINERALS) tablet Take 1 tablet by mouth daily.     potassium chloride SA (KLOR-CON M) 20 MEQ tablet Take 2 tablets (40 mEq total) by mouth daily. 30 tablet 0   amLODipine (NORVASC) 5 MG tablet TAKE 1 TABLET (5 MG TOTAL) BY MOUTH DAILY. 90 tablet 1   atorvastatin (LIPITOR) 10 MG tablet Take 1 tablet (10 mg  total) by mouth daily. 90 tablet 3   buPROPion (WELLBUTRIN SR) 200 MG 12 hr tablet Take 1 tablet (200 mg total) by mouth 2 (two) times daily. 60 tablet 2   omeprazole (PRILOSEC) 20 MG capsule TAKE 1 CAPSULE BY MOUTH EVERY DAY 90 capsule 3   traZODone (DESYREL) 100 MG tablet TAKE 1 TO 1.5 TABLETS BY MOUTH DAILY AT BEDTIME AS NEEDED FOR SLEEP. 45 tablet 0   pregabalin (LYRICA) 150 MG capsule Take 1 capsule (150 mg total) by mouth in the morning, at noon, in  the evening, and at bedtime. 120 capsule 1   No facility-administered medications prior to visit.     ROS Review of Systems  Constitutional:  Negative for activity change and appetite change.  HENT:  Negative for sinus pressure and sore throat.   Respiratory:  Negative for chest tightness, shortness of breath and wheezing.   Cardiovascular:  Negative for chest pain and palpitations.  Gastrointestinal:  Negative for abdominal distention, abdominal pain and constipation.  Genitourinary: Negative.   Musculoskeletal: Negative.   Neurological:  Positive for numbness.  Psychiatric/Behavioral:  Negative for behavioral problems and dysphoric mood.     Objective:  BP 135/73   Pulse (!) 57   Temp 98.1 F (36.7 C) (Oral)   Ht 5\' 4"  (1.626 m)   Wt 134 lb (60.8 kg)   LMP  (LMP Unknown)   SpO2 95%   BMI 23.00 kg/m      03/20/2023    2:39 PM 02/27/2023    4:10 PM 01/11/2023    1:23 PM  BP/Weight  Systolic BP 135 138 90  Diastolic BP 73 75 60  Wt. (Lbs) 134 134.6 135.25  BMI 23 kg/m2 22.43 kg/m2 23.22 kg/m2      Physical Exam Constitutional:      Appearance: She is well-developed.  Cardiovascular:     Rate and Rhythm: Bradycardia present.     Heart sounds: Normal heart sounds. No murmur heard. Pulmonary:     Effort: Pulmonary effort is normal.     Breath sounds: Normal breath sounds. No wheezing or rales.  Chest:     Chest wall: No tenderness.  Abdominal:     General: Bowel sounds are normal. There is no distension.      Palpations: Abdomen is soft. There is no mass.     Tenderness: There is no abdominal tenderness.  Musculoskeletal:        General: Normal range of motion.     Right lower leg: No edema.     Left lower leg: No edema.  Neurological:     Mental Status: She is alert and oriented to person, place, and time.  Psychiatric:        Mood and Affect: Mood normal.        Latest Ref Rng & Units 01/09/2023   12:42 PM 12/06/2022    2:41 PM 11/17/2022    3:03 PM  CMP  Glucose 70 - 99 mg/dL 161  86  83   BUN 6 - 20 mg/dL 9  8  6    Creatinine 0.44 - 1.00 mg/dL 0.96  0.45  4.09   Sodium 135 - 145 mmol/L 141  139  140   Potassium 3.5 - 5.1 mmol/L 4.3  3.9  4.5   Chloride 98 - 111 mmol/L 100  97  98   CO2 22 - 32 mmol/L 32  26  24   Calcium 8.9 - 10.3 mg/dL 81.1  9.5  9.8   Total Protein 6.5 - 8.1 g/dL 7.7  7.0  7.4   Total Bilirubin 0.3 - 1.2 mg/dL 0.5  0.3  0.2   Alkaline Phos 38 - 126 U/L 184  203  203   AST 15 - 41 U/L 20  25  31    ALT 0 - 44 U/L 16  23  23      Lipid Panel     Component Value Date/Time   CHOL 233 (H) 11/17/2022 1503   TRIG 169 (H) 11/17/2022 1503   HDL 40 11/17/2022 1503  CHOLHDL 5.8 (H) 11/17/2022 1503   CHOLHDL 4.0 07/19/2016 1238   VLDL 26 07/19/2016 1238   LDLCALC 162 (H) 11/17/2022 1503    CBC    Component Value Date/Time   WBC 12.1 (H) 01/09/2023 1242   WBC 12.2 (H) 08/31/2022 1101   RBC 3.90 01/09/2023 1242   HGB 12.9 01/09/2023 1242   HGB 14.1 08/26/2021 0921   HCT 38.6 01/09/2023 1242   HCT 40.7 08/26/2021 0921   PLT 254 01/09/2023 1242   PLT 199 08/26/2021 0921   MCV 99.0 01/09/2023 1242   MCV 102 (H) 08/26/2021 0921   MCH 33.1 01/09/2023 1242   MCHC 33.4 01/09/2023 1242   RDW 14.2 01/09/2023 1242   RDW 11.8 08/26/2021 0921   LYMPHSABS 3.0 01/09/2023 1242   LYMPHSABS 2.7 08/26/2021 0921   MONOABS 0.7 01/09/2023 1242   EOSABS 0.2 01/09/2023 1242   EOSABS 0.2 08/26/2021 0921   BASOSABS 0.1 01/09/2023 1242   BASOSABS 0.1 08/26/2021 3086     Lab Results  Component Value Date   HGBA1C 5.3 08/31/2021    Assessment & Plan:      Anxiety:  Partially controlled on Buspirone 30mg  twice daily. Discussed the addition of Prozac 20mg  daily to further manage symptoms. -Add Prozac 20mg  daily to current regimen.  Advised that due to chronic liver disease we will be unable to uptitrate dose of Prozac. -She remains off Cymbalta due to chronic liver disease -Continue Buspirone 30mg  twice daily.  Depression:  On Wellbutrin 200mg  twice daily. -Continue Wellbutrin 200mg  twice daily.  Insomnia: On Trazodone 100mg . -Continue Trazodone 100mg .  Neuropathy (Strachan Syndrome):  Experiencing increased nerve pain since discontinuation of Cymbalta. Currently on Lyrica 150mg  three times daily. Discussed the possibility of adding Nortriptyline, but patient declined due to previous ineffective use. -Continue Lyrica 150mg  three times daily.  Liver Disease/liver cirrhosis Patient recently stopped Cymbalta due to liver concerns. Currently taking Atorvastatin for elevated cholesterol. Discussed the need for regular liver enzyme monitoring. -Order liver enzyme tests today. -Continue Atorvastatin as prescribed.  Nutrition:  Patient expressed interest in dietary recommendations for liver health. -Refer to a dietician for personalized dietary advice.  Potassium Supplementation:  Patient has been off Klorcon (potassium) for about a month. Discussed the need for blood work to determine if supplementation is necessary. -Order blood work to check potassium levels.  General Health Maintenance: -Continue multivitamin and B vitamins. -Continue Hepatitis B vaccination series. -Order AFP tumor marker test.           Meds ordered this encounter  Medications   busPIRone (BUSPAR) 30 MG tablet    Sig: Take 1 tablet (30 mg total) by mouth in the morning and at bedtime.    Dispense:  180 tablet    Refill:  1   FLUoxetine (PROZAC) 20 MG tablet     Sig: Take 1 tablet (20 mg total) by mouth daily.    Dispense:  90 tablet    Refill:  1   amLODipine (NORVASC) 5 MG tablet    Sig: Take 1 tablet (5 mg total) by mouth daily.    Dispense:  90 tablet    Refill:  1   atorvastatin (LIPITOR) 10 MG tablet    Sig: Take 1 tablet (10 mg total) by mouth daily.    Dispense:  90 tablet    Refill:  1   buPROPion (WELLBUTRIN SR) 200 MG 12 hr tablet    Sig: Take 1 tablet (200 mg total) by mouth 2 (two) times  daily.    Dispense:  180 tablet    Refill:  1   omeprazole (PRILOSEC) 20 MG capsule    Sig: Take 1 capsule (20 mg total) by mouth daily.    Dispense:  90 capsule    Refill:  1   traZODone (DESYREL) 100 MG tablet    Sig: Take 1 tablet (100 mg total) by mouth at bedtime.    Dispense:  90 tablet    Refill:  1    Follow-up: Return in about 6 months (around 09/20/2023) for Chronic medical conditions.       Hoy Register, MD, FAAFP. [facility] [place], [place] [phone]   03/20/2023, 5:56 PM

## 2023-03-20 NOTE — Progress Notes (Signed)
Needs referral to a dietitian Medication refills.

## 2023-03-21 ENCOUNTER — Ambulatory Visit (HOSPITAL_COMMUNITY): Admission: RE | Admit: 2023-03-21 | Payer: MEDICAID | Source: Ambulatory Visit

## 2023-03-21 ENCOUNTER — Telehealth: Payer: Self-pay | Admitting: Neurology

## 2023-03-21 ENCOUNTER — Ambulatory Visit: Payer: MEDICAID

## 2023-03-21 LAB — CMP14+EGFR
ALT: 23 IU/L (ref 0–32)
AST: 24 IU/L (ref 0–40)
Albumin: 4.2 g/dL (ref 3.8–4.9)
Alkaline Phosphatase: 201 IU/L — ABNORMAL HIGH (ref 44–121)
BUN/Creatinine Ratio: 10 (ref 9–23)
BUN: 7 mg/dL (ref 6–24)
Bilirubin Total: 0.4 mg/dL (ref 0.0–1.2)
CO2: 27 mmol/L (ref 20–29)
Calcium: 9.7 mg/dL (ref 8.7–10.2)
Chloride: 99 mmol/L (ref 96–106)
Creatinine, Ser: 0.68 mg/dL (ref 0.57–1.00)
Globulin, Total: 3.3 g/dL (ref 1.5–4.5)
Glucose: 96 mg/dL (ref 70–99)
Potassium: 4.7 mmol/L (ref 3.5–5.2)
Sodium: 139 mmol/L (ref 134–144)
Total Protein: 7.5 g/dL (ref 6.0–8.5)
eGFR: 105 mL/min/{1.73_m2} (ref >59)

## 2023-03-21 LAB — AFP TUMOR MARKER: AFP, Serum, Tumor Marker: 3.3 ng/mL (ref 0.0–9.2)

## 2023-03-21 NOTE — Telephone Encounter (Signed)
Left message with the after hour service on 03-21-23 at 12:48 pm    Caller states that she needs a refill her rx  the bottle state that has refills but the pharmacy is saying she does not  please call

## 2023-03-22 NOTE — Telephone Encounter (Signed)
Refills were sent in yesterday (03/21/2023 12:41 PM EDT) to [facility] on [address].  Called patient and left a detailed message per DPR that her refill was sent in yesterday. Left contact information incase she needs to contact the office.

## 2023-03-29 ENCOUNTER — Ambulatory Visit (INDEPENDENT_AMBULATORY_CARE_PROVIDER_SITE_OTHER): Payer: MEDICAID | Admitting: Gastroenterology

## 2023-03-29 ENCOUNTER — Ambulatory Visit (HOSPITAL_COMMUNITY)
Admission: RE | Admit: 2023-03-29 | Discharge: 2023-03-29 | Disposition: A | Discharge status: Home / Self Care | Payer: MEDICAID | Source: Ambulatory Visit | Attending: Gastroenterology | Admitting: Gastroenterology

## 2023-03-29 DIAGNOSIS — K7030 Alcoholic cirrhosis of liver without ascites: Secondary | ICD-10-CM | POA: Insufficient documentation

## 2023-03-29 DIAGNOSIS — Z23 Encounter for immunization: Secondary | ICD-10-CM

## 2023-03-29 DIAGNOSIS — R7989 Other specified abnormal findings of blood chemistry: Secondary | ICD-10-CM | POA: Diagnosis present

## 2023-03-29 DIAGNOSIS — R1010 Upper abdominal pain, unspecified: Secondary | ICD-10-CM | POA: Insufficient documentation

## 2023-03-29 DIAGNOSIS — F1010 Alcohol abuse, uncomplicated: Secondary | ICD-10-CM | POA: Insufficient documentation

## 2023-03-29 NOTE — Progress Notes (Signed)
Patient arrived at the office today for 2nd Hep A vaccine. We reviewed possible side effects. Patient tolerated vaccine well, no complications noted.

## 2023-05-08 ENCOUNTER — Encounter: Payer: Self-pay | Admitting: Gastroenterology

## 2023-05-09 ENCOUNTER — Ambulatory Visit: Payer: Self-pay | Admitting: Surgery

## 2023-05-09 NOTE — H&P (Signed)
History of Present Illness: [name] is a 52 y.o. female who is seen today as an office consultation for evaluation of new patient (New pt chole )     For about 6 months now, she has been having intermittent RUQ abdominal pain. The pain gets worse after eating, specifically greasy and spicy foods. It now occurs very frequently. She has a history of EtOH cirrhosis. LFTs are normal and last INR in April was 1.0. She follows with Dr. Myrtie Neither and has regular HCC screenings. Her most recent US on 7/17 showed cholelithiasis without signs of acute cholecystitis. She was referred to discuss cholecystectomy.   MELD 7 Child-Pugh class A     Review of Systems: A complete review of systems was obtained from the patient.  I have reviewed this information and discussed as appropriate with the patient.  See HPI as well for other ROS.     Medical History: Past Medical History Past Medical History: Diagnosis Date  Anxiety    Arthritis    End-stage liver disease (CMS/HHS-HCC)    GERD (gastroesophageal reflux disease)    Hyperlipidemia    Hypertension    Liver disease    Substance abuse (CMS/HHS-HCC)         Problem List There is no problem list on file for this patient.     Past Surgical History Past Surgical History: Procedure Laterality Date  HYSTERECTOMY          Allergies Allergies Allergen Reactions  Macrobid [Nitrofurantoin Monohyd/M-Cryst] Other (See Comments)     Passing out - numbness in mouth       Medications Ordered Prior to Encounter Current Outpatient Medications on File Prior to Visit Medication Sig Dispense Refill  atorvastatin (LIPITOR) 10 MG tablet Take 1 tablet by mouth once daily      buPROPion (WELLBUTRIN SR) 200 MG SR tablet Take 200 mg by mouth 2 (two) times daily      omeprazole (PRILOSEC) 20 MG DR capsule Take 1 capsule by mouth once daily      amLODIPine (NORVASC) 5 MG tablet Take 5 mg by mouth once daily      busPIRone (BUSPAR) 30 MG tablet TAKE 1  TABLET (30 MG TOTAL) BY MOUTH IN THE MORNING AND AT BEDTIME.      pregabalin (LYRICA) 150 MG capsule Take 150 mg by mouth 3 (three) times daily        No current facility-administered medications on file prior to visit.      Family History Family History Problem Relation Age of Onset  Hyperlipidemia (Elevated cholesterol) Mother    High blood pressure (Hypertension) Mother    High blood pressure (Hypertension) Father    Stroke Father        Tobacco Use History Social History    Tobacco Use Smoking Status Every Day  Types: Cigarettes Smokeless Tobacco Current      Social History Social History    Socioeconomic History  Marital status: Widowed Tobacco Use  Smoking status: Every Day     Types: Cigarettes  Smokeless tobacco: Current Substance and Sexual Activity  Alcohol use: Yes  Drug use: Never    Social Determinants of Health    Food Insecurity: Low Risk  (10/10/2022)   Received from [facility] visits prior to 11/12/2022.   Food    Within the past 12 months, you worried that your food would run out before you got money to buy more food: Never true    Within the past  12 months, the food you bought just didn't last and you didn't have money to get more: Never true Transportation Needs: No Transportation Needs (10/10/2022)   Received from [facility] visits prior to 11/12/2022.   Transportation    In the past 12 months, has lack of reliable transportation kept you from medical appointments, meetings, work or from getting things needed for daily living?: No Housing Stability: Low Risk  (10/10/2022)   Received from [facility] visits prior to 11/12/2022.   Living Needs    What is your living situation today?: I have a steady place to live    Think about the place you live.  Do you have problems with any of the following? (Choose all that apply): None of the above      Objective:   Vitals:   05/09/23  0914 05/09/23 0919 BP: 130/76   Pulse: 66   Temp: 36.7 C (98.1 F)   SpO2: 97%   Weight: 59.6 kg (131 lb 6.4 oz)   Height: 163.8 cm (5' 4.5")   PainSc:     6 PainLoc:   Abdomen   Body mass index is 22.21 kg/m.   Physical Exam Vitals reviewed.  Constitutional:      General: She is not in acute distress.    Appearance: Normal appearance.  HENT:     Head: Normocephalic and atraumatic.  Eyes:     General: No scleral icterus.    Conjunctiva/sclera: Conjunctivae normal.  Cardiovascular:     Rate and Rhythm: Normal rate and regular rhythm.     Heart sounds: No murmur heard. Pulmonary:     Effort: Pulmonary effort is normal. No respiratory distress.     Breath sounds: Normal breath sounds. No wheezing.  Abdominal:     General: There is no distension.     Palpations: Abdomen is soft.     Comments: Mild RUQ tenderness to palpation. Well-healed laparoscopic surgical scars.  Musculoskeletal:        General: Normal range of motion.  Skin:    General: Skin is warm and dry.     Coloration: Skin is not jaundiced.  Neurological:     General: No focal deficit present.     Mental Status: She is alert and oriented to person, place, and time.  Psychiatric:        Mood and Affect: Mood normal.        Behavior: Behavior normal.            Labs, Imaging and Diagnostic Testing: US abdomen 03/29/23: IMPRESSION: 1. Cholelithiasis without evidence of acute cholecystitis. 2. Coarsened echotexture with nodular contour of the liver, consistent with cirrhosis.     Assessment and Plan:   Assessment Diagnoses and all orders for this visit:   Calculus of gallbladder without cholecystitis without obstruction -     CCS Case Posting Request; Future   Alcoholic cirrhosis of liver without ascites (CMS/HHS-HCC)     [name] is a 52 yo female with RUQ abdominal pain and cholelithiasis. Her symptoms are typical of biliary colic. She does have a history of cirrhosis, which is  well-compensated without signs of portal hypertension. I discussed she is at higher risk of surgical complications given her cirrhosis but is not at prohibitive risk. Laparoscopic cholecystectomy was recommended. The details of this procedure were discussed with the patient, including the risks of bleeding, infection, bile leak, and <0.5% risk of common bile duct injury. The patient expressed understanding and  agrees to proceed with surgery. She will be contacted to schedule an elective surgery date. All questions were answered.   Sophronia Simas, MD [facility] Surgery General, Hepatobiliary and Pancreatic Surgery 05/09/23 10:38 AM

## 2023-05-24 NOTE — Progress Notes (Signed)
Surgical Instructions    Your procedure is scheduled on Thursday June 15, 2023.  Report to [facility] Main Entrance "A" at 5:30 A.M., then check in with the Admitting office.  Call this number if you have problems the morning of surgery:  [phone]   If you have any questions prior to your surgery date call [phone]: Open Monday-Friday 8am-4pm If you experience any cold or flu symptoms such as cough, fever, chills, shortness of breath, etc. between now and your scheduled surgery, please notify us at the above number     Remember:  Do not eat after midnight the night before your surgery.  You may drink clear liquids until 4:30 the morning of your surgery.   Clear liquids allowed are: Water, Non-Citrus Juices (without pulp), Carbonated Beverages, Clear Tea, Black Coffee ONLY (NO MILK, CREAM OR POWDERED CREAMER of any kind), and Gatorade.    Take these medicines the morning of surgery with A SIP OF WATER:   amLODipine (NORVASC)  atorvastatin (LIPITOR)  baclofen (LIORESAL)  buPROPion (WELLBUTRIN SR)  busPIRone (BUSPAR)  FLUoxetine (PROZAC)  omeprazole (PRILOSEC)  pregabalin (LYRICA)   As of today, STOP taking any Aspirin (unless otherwise instructed by your surgeon) Aleve, Naproxen, Ibuprofen, Motrin, Advil, Goody's, BC's, all herbal medications, fish oil, and all vitamins.  Special instructions:    Oral Hygiene is also important to reduce your risk of infection.  Remember - BRUSH YOUR TEETH THE MORNING OF SURGERY WITH YOUR REGULAR TOOTHPASTE   [place]- Preparing For Surgery  Before surgery, you can play an important role. Because skin is not sterile, your skin needs to be as free of germs as possible. You can reduce the number of germs on your skin by washing with CHG (chlorahexidine gluconate) Soap before surgery.  CHG is an antiseptic cleaner which kills germs and bonds with the skin to continue killing germs even after washing.     Please do not use if you have  an allergy to CHG or antibacterial soaps. If your skin becomes reddened/irritated stop using the CHG.  Do not shave (including legs and underarms) for at least 48 hours prior to first CHG shower. It is OK to shave your face.  Please follow these instructions carefully.     Shower the NIGHT BEFORE SURGERY and the MORNING OF SURGERY with CHG Soap.   If you chose to wash your hair, wash your hair first as usual with your normal shampoo. After you shampoo, rinse your hair and body thoroughly to remove the shampoo.  Then [employer] and genitals (private parts) with your normal soap and rinse thoroughly to remove soap.  After that Use CHG Soap as you would any other liquid soap. You can apply CHG directly to the skin and wash gently with a scrungie or a clean washcloth.   Apply the CHG Soap to your body ONLY FROM THE NECK DOWN.  Do not use on open wounds or open sores. Avoid contact with your eyes, ears, mouth and genitals (private parts). Wash Face and genitals (private parts)  with your normal soap.   Wash thoroughly, paying special attention to the area where your surgery will be performed.  Thoroughly rinse your body with warm water from the neck down.  DO NOT shower/wash with your normal soap after using and rinsing off the CHG Soap.  Pat yourself dry with a CLEAN TOWEL.  Wear CLEAN PAJAMAS to bed the night before surgery  Place CLEAN SHEETS on your bed the night before  your surgery  DO NOT SLEEP WITH PETS.   Day of Surgery:  Take a shower with CHG soap. Wear Clean/Comfortable clothing the morning of surgery Do not apply any deodorants/lotions.   Remember to brush your teeth WITH YOUR REGULAR TOOTHPASTE.  Do not wear jewelry or makeup. Do not wear lotions, powders, perfumes/cologne or deodorant. Do not shave 48 hours prior to surgery.  Men may shave face and neck. Do not bring valuables to the hospital. Do not wear nail polish, gel polish, artificial nails, or any other type of  covering on natural nails (fingers and toes) If you have artificial nails or gel coating that need to be removed by a nail salon, please have this removed prior to surgery. Artificial nails or gel coating may interfere with anesthesia's ability to adequately monitor your vital signs.  [place] is not responsible for any belongings or valuables.    Do NOT Smoke (Tobacco/Vaping)  24 hours prior to your procedure  If you use a CPAP at night, you may bring your mask for your overnight stay.   Contacts, glasses, hearing aids, dentures or partials may not be worn into surgery, please bring cases for these belongings   For patients admitted to the hospital, discharge time will be determined by your treatment team.   Patients discharged the day of surgery will not be allowed to drive home, and someone needs to stay with them for 24 hours.   SURGICAL WAITING ROOM VISITATION Patients having surgery or a procedure may have no more than 2 support people in the waiting area - these visitors may rotate.   Children under the age of 67 must have an adult with them who is not the patient. If the patient needs to stay at the hospital during part of their recovery, the visitor guidelines for inpatient rooms apply. Pre-op nurse will coordinate an appropriate time for 1 support person to accompany patient in pre-op.  This support person may not rotate.   Please refer to [email or web address] for the visitor guidelines for Inpatients (after your surgery is over and you are in a regular room).   If you received a COVID test during your pre-op visit, it is requested that you wear a mask when out in public, stay away from anyone that may not be feeling well, and notify your surgeon if you develop symptoms. If you have been in contact with anyone that has tested positive in the last 10 days, please notify your surgeon.    Please read over the following fact sheets  that you were given.

## 2023-05-25 ENCOUNTER — Inpatient Hospital Stay (HOSPITAL_COMMUNITY)
Admission: RE | Admit: 2023-05-25 | Discharge: 2023-05-25 | Disposition: A | Discharge status: Home / Self Care | Payer: MEDICAID | Source: Ambulatory Visit

## 2023-05-30 NOTE — Progress Notes (Signed)
COVID Vaccine received:  [x]  No []  Yes Date of any COVID positive Test in last 90 days:  none  PCP - Hoy Register, MD  Cardiologist -  none  Chest x-ray - 12-08-2021  1v  Epic EKG - 12-09-2021  Epic    Will repeat at PST  Stress Test -  ECHO - 12-23-2013  Epic Cardiac Cath -   PCR screen: []  Ordered & Completed           []   No Order but Needs PROFEND           [x]   N/A for this surgery  Surgery Plan:  [x]  Ambulatory   []  Outpatient in bed  []  Admit  Anesthesia:    [x]  General  []  Spinal  []   Choice []   MAC  Bowel Prep - [x]  No  []   Yes ______  Pacemaker / ICD device [x]  No []  Yes   Spinal Cord Stimulator:[x]  No []  Yes       History of Sleep Apnea? [x]  No []  Yes   CPAP used?- [x]  No []  Yes    Does the patient monitor blood sugar?  []  No []  Yes  []  N/A Patient has: [x]  NO Hx DM   []  Pre-DM   []  DM1  []   DM2  Blood Thinner / Instructions:  none Aspirin Instructions:  none  ERAS Protocol Ordered: []  No  [x]  Yes PRE-SURGERY []  ENSURE  []  G2   [x]  No Drink Ordered Patient is to be NPO after: 04:30  Activity level: Patient is able to climb a flight of stairs without difficulty; [x]  No CP  [x]  No SOB.   Patient can perform ADLs without assistance.   Anesthesia review: HTN, anxiety/depression, ETOH abuse/cirrhosis, GERD, Peripheral neuropathy, Smoker   Patient denies shortness of breath, fever, cough and chest pain at PAT appointment.  Patient verbalized understanding and agreement to the Pre-Surgical Instructions that were given to them at this PAT appointment. Patient was also educated of the need to review these PAT instructions again prior to her surgery.I reviewed the appropriate phone numbers to call if they have any and questions or concerns.

## 2023-05-30 NOTE — Patient Instructions (Signed)
SURGICAL WAITING ROOM VISITATION Patients having surgery or a procedure may have no more than 2 support people in the waiting area - these visitors may rotate in the visitor waiting room.   Due to an increase in RSV and influenza rates and associated hospitalizations, children ages 26 and under may not visit patients in [facility] hospitals. If the patient needs to stay at the hospital during part of their recovery, the visitor guidelines for inpatient rooms apply.  PRE-OP VISITATION  Pre-op nurse will coordinate an appropriate time for 1 support person to accompany the patient in pre-op.  This support person may not rotate.  This visitor will be contacted when the time is appropriate for the visitor to come back in the pre-op area.  Please refer to the [facility] website for the visitor guidelines for Inpatients (after your surgery is over and you are in a regular room).  You are not required to quarantine at this time prior to your surgery. However, you must do this: Hand Hygiene often Do NOT share personal items Notify your provider if you are in close contact with someone who has COVID or you develop fever 100.4 or greater, new onset of sneezing, cough, sore throat, shortness of breath or body aches.  If you test positive for Covid or have been in contact with anyone that has tested positive in the last 10 days please notify you surgeon.    Your procedure is scheduled on:  Thursday  June 15, 2023  Report to [facility] Main Entrance: [address] [facility] entrance where the [employer] is available.   Report to admitting at:  05:15   AM  Call this number if you have any questions or problems the morning of surgery [phone]  Do not eat food after Midnight the night prior to your surgery/procedure.  After Midnight you may have the following liquids until  04:30 AM DAY OF SURGERY  Clear Liquid Diet Water Black Coffee (sugar ok, NO MILK/CREAM OR CREAMERS)  Tea (sugar ok, NO  MILK/CREAM OR CREAMERS) regular and decaf                             Plain Jell-O  with no fruit (NO RED)                                           Fruit ices (not with fruit pulp, NO RED)                                     Popsicles (NO RED)                                                                  Juice: NO CITRUS JUICES: only apple, WHITE grape, WHITE cranberry Sports drinks like Gatorade or Powerade (NO RED)             FOLLOW BOWEL PREP AND ANY ADDITIONAL PRE OP INSTRUCTIONS YOU RECEIVED FROM YOUR SURGEON'S OFFICE!!!   Oral Hygiene is also important to reduce your risk  of infection.        Remember - BRUSH YOUR TEETH THE MORNING OF SURGERY WITH YOUR REGULAR TOOTHPASTE  Do NOT smoke after Midnight the night before surgery.  STOP TAKING all Vitamins, Herbs and supplements 1 week before your surgery.   Take ONLY these medicines the morning of surgery with A SIP OF WATER: omeprazole (Prilosec), amlodipine, Bupropion (Wellbutrin), Pregabalin (Lyrica),  Buspirone (Buspar)                    You may not have any metal on your body including hair pins, jewelry, and body piercing  Do not wear make-up, lotions, powders, perfumes, or deodorant  Do not wear nail polish including gel and S&S, artificial / acrylic nails, or any other type of covering on natural nails including finger and toenails. If you have artificial nails, gel coating, etc., that needs to be removed by a nail salon, Please have this removed prior to surgery. Not doing so may mean that your surgery could be cancelled or delayed if the Surgeon or anesthesia staff feels like they are unable to monitor you safely.   Do not shave 48 hours prior to surgery to avoid nicks in your skin which may contribute to postoperative infections.   Contacts, Hearing Aids, dentures or bridgework may not be worn into surgery. DENTURES WILL BE REMOVED PRIOR TO SURGERY PLEASE DO NOT APPLY "Poly grip" OR ADHESIVES!!!  Patients discharged  on the day of surgery will not be allowed to drive home.  Someone NEEDS to stay with you for the first 24 hours after anesthesia.  Do not bring your home medications to the hospital. The Pharmacy will dispense medications listed on your medication list to you during your admission in the Hospital.  Please read over the following fact sheets you were given: IF YOU HAVE QUESTIONS ABOUT YOUR PRE-OP INSTRUCTIONS, PLEASE CALL [phone]   [facility] - Preparing for Surgery Before surgery, you can play an important role.  Because skin is not sterile, your skin needs to be as free of germs as possible.  You can reduce the number of germs on your skin by washing with CHG (chlorahexidine gluconate) soap before surgery.  CHG is an antiseptic cleaner which kills germs and bonds with the skin to continue killing germs even after washing. Please DO NOT use if you have an allergy to CHG or antibacterial soaps.  If your skin becomes reddened/irritated stop using the CHG and inform your nurse when you arrive at Short Stay. Do not shave (including legs and underarms) for at least 48 hours prior to the first CHG shower.  You may shave your face/neck.  Please follow these instructions carefully:  1.  Shower with CHG Soap the night before surgery and the  morning of surgery.  2.  If you choose to wash your hair, wash your hair first as usual with your normal  shampoo.  3.  After you shampoo, rinse your hair and body thoroughly to remove the shampoo.                             4.  Use CHG as you would any other liquid soap.  You can apply chg directly to the skin and wash.  Gently with a scrungie or clean washcloth.  5.  Apply the CHG Soap to your body ONLY FROM THE NECK DOWN.   Do not use on face/ open  Wound or open sores. Avoid contact with eyes, ears mouth and genitals (private parts).                       Wash face,  Genitals (private parts) with your normal soap.             6.   Wash thoroughly, paying special attention to the area where your  surgery  will be performed.  7.  Thoroughly rinse your body with warm water from the neck down.  8.  DO NOT shower/wash with your normal soap after using and rinsing off the CHG Soap.            9.  Pat yourself dry with a clean towel.            10.  Wear clean pajamas.            11.  Place clean sheets on your bed the night of your first shower and do not  sleep with pets.  ON THE DAY OF SURGERY : Do not apply any lotions/deodorants the morning of surgery.  Please wear clean clothes to the hospital/surgery center.     FAILURE TO FOLLOW THESE INSTRUCTIONS MAY RESULT IN THE CANCELLATION OF YOUR SURGERY  PATIENT SIGNATURE_________________________________  NURSE SIGNATURE__________________________________  ________________________________________________________________________

## 2023-05-31 ENCOUNTER — Encounter (HOSPITAL_COMMUNITY)
Admission: RE | Admit: 2023-05-31 | Discharge: 2023-05-31 | Disposition: A | Discharge status: Home / Self Care | Payer: MEDICAID | Source: Ambulatory Visit | Attending: Surgery | Admitting: Surgery

## 2023-05-31 ENCOUNTER — Encounter (HOSPITAL_COMMUNITY): Payer: Self-pay | Admitting: *Deleted

## 2023-05-31 ENCOUNTER — Other Ambulatory Visit: Payer: Self-pay

## 2023-05-31 VITALS — BP 114/71 | Temp 98.6°F | Resp 12 | Ht 64.0 in | Wt 128.0 lb

## 2023-05-31 DIAGNOSIS — Z01818 Encounter for other preprocedural examination: Secondary | ICD-10-CM | POA: Diagnosis present

## 2023-05-31 DIAGNOSIS — I10 Essential (primary) hypertension: Secondary | ICD-10-CM | POA: Diagnosis not present

## 2023-05-31 DIAGNOSIS — Z01810 Encounter for preprocedural cardiovascular examination: Secondary | ICD-10-CM | POA: Diagnosis not present

## 2023-05-31 DIAGNOSIS — K7030 Alcoholic cirrhosis of liver without ascites: Secondary | ICD-10-CM | POA: Diagnosis not present

## 2023-05-31 DIAGNOSIS — Z01812 Encounter for preprocedural laboratory examination: Secondary | ICD-10-CM | POA: Diagnosis not present

## 2023-05-31 HISTORY — DX: Depression, unspecified: F32.A

## 2023-05-31 LAB — CBC
HCT: 43.2 % (ref 36.0–46.0)
Hemoglobin: 14.6 g/dL (ref 12.0–15.0)
MCH: 34 pg (ref 26.0–34.0)
MCHC: 33.8 g/dL (ref 30.0–36.0)
MCV: 100.5 fL — ABNORMAL HIGH (ref 80.0–100.0)
Platelets: 154 10*3/uL (ref 150–400)
RBC: 4.3 MIL/uL (ref 3.87–5.11)
RDW: 13.4 % (ref 11.5–15.5)
WBC: 11 10*3/uL — ABNORMAL HIGH (ref 4.0–10.5)
nRBC: 0 % (ref 0.0–0.2)

## 2023-05-31 LAB — COMPREHENSIVE METABOLIC PANEL WITH GFR
ALT: 27 U/L (ref 0–44)
AST: 31 U/L (ref 15–41)
Albumin: 3.9 g/dL (ref 3.5–5.0)
Alkaline Phosphatase: 168 U/L — ABNORMAL HIGH (ref 38–126)
Anion gap: 12 (ref 5–15)
BUN: 12 mg/dL (ref 6–20)
CO2: 26 mmol/L (ref 22–32)
Calcium: 9.4 mg/dL (ref 8.9–10.3)
Chloride: 98 mmol/L (ref 98–111)
Creatinine, Ser: 0.6 mg/dL (ref 0.44–1.00)
GFR, Estimated: >60 mL/min (ref >60)
Glucose, Bld: 112 mg/dL — ABNORMAL HIGH (ref 70–99)
Potassium: 3.9 mmol/L (ref 3.5–5.1)
Sodium: 136 mmol/L (ref 135–145)
Total Bilirubin: 0.6 mg/dL (ref 0.3–1.2)
Total Protein: 7.8 g/dL (ref 6.5–8.1)

## 2023-06-01 ENCOUNTER — Other Ambulatory Visit: Payer: Self-pay | Admitting: Family Medicine

## 2023-06-01 ENCOUNTER — Encounter: Payer: Self-pay | Admitting: Family Medicine

## 2023-06-01 DIAGNOSIS — F4312 Post-traumatic stress disorder, chronic: Secondary | ICD-10-CM

## 2023-06-01 MED ORDER — TRAZODONE HCL 100 MG PO TABS: 50.0000 mg | ORAL_TABLET | Freq: Every day | ORAL | 1 refills | Status: DC

## 2023-06-07 ENCOUNTER — Ambulatory Visit: Payer: MEDICAID | Admitting: Dietician

## 2023-06-11 IMAGING — MG MM DIGITAL SCREENING BILAT W/ TOMO AND CAD
8 series · 8 of 24 positions shown · non-contrast
Comparison: Previous exam(s).

CLINICAL DATA: Screening.

EXAM:
DIGITAL SCREENING BILATERAL MAMMOGRAM WITH TOMOSYNTHESIS AND CAD
TECHNIQUE: Bilateral screening digital craniocaudal and mediolateral oblique
mammograms were obtained. Bilateral screening digital breast
tomosynthesis was performed. The images were evaluated with
computer-aided detection.

[R MLO synth-2D]
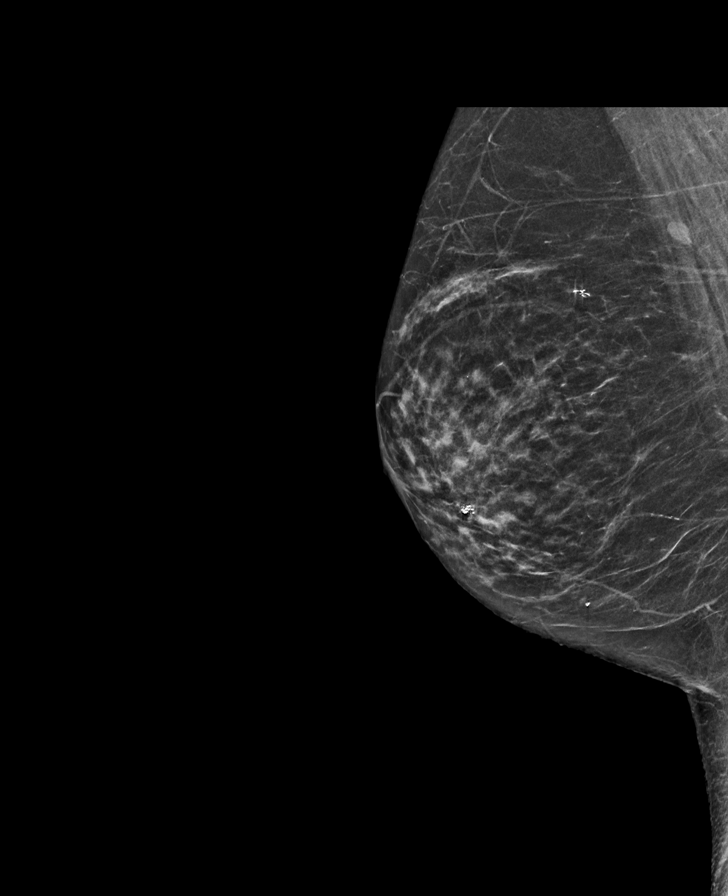

[L MLO synth-2D]
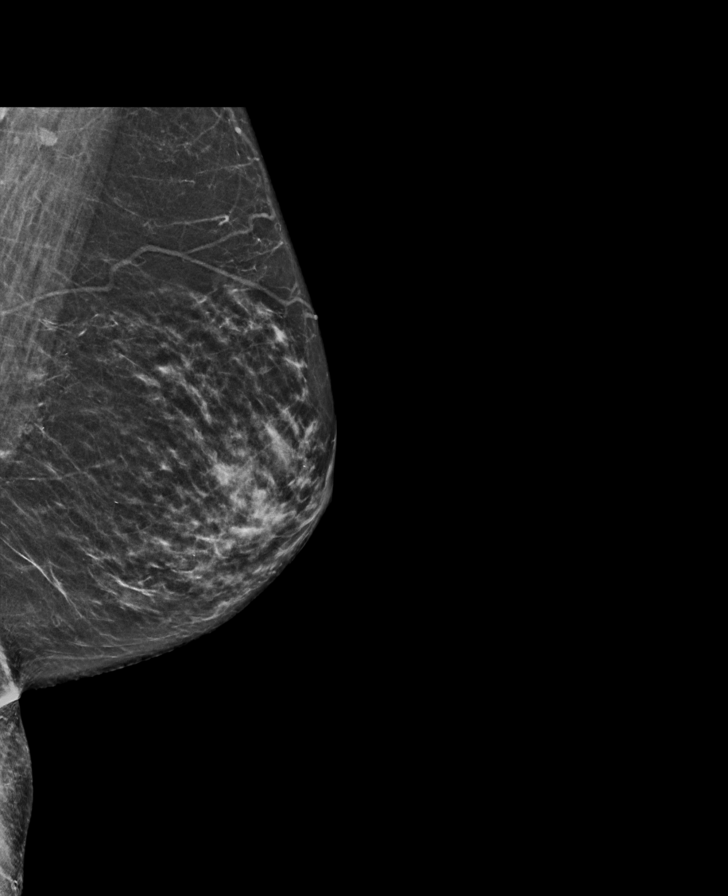

[R CC synth-2D]
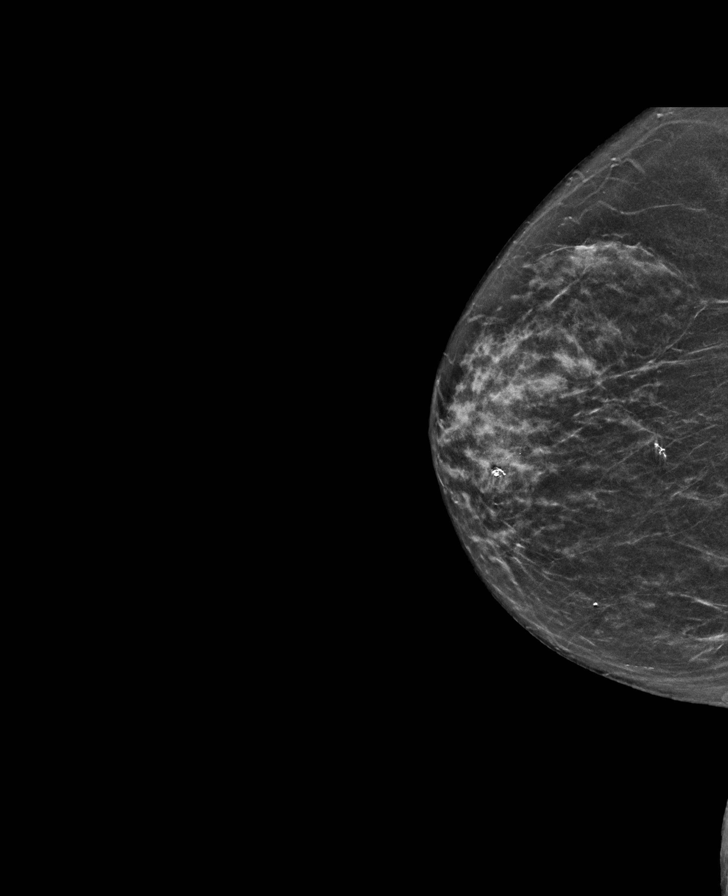

[L CC synth-2D]
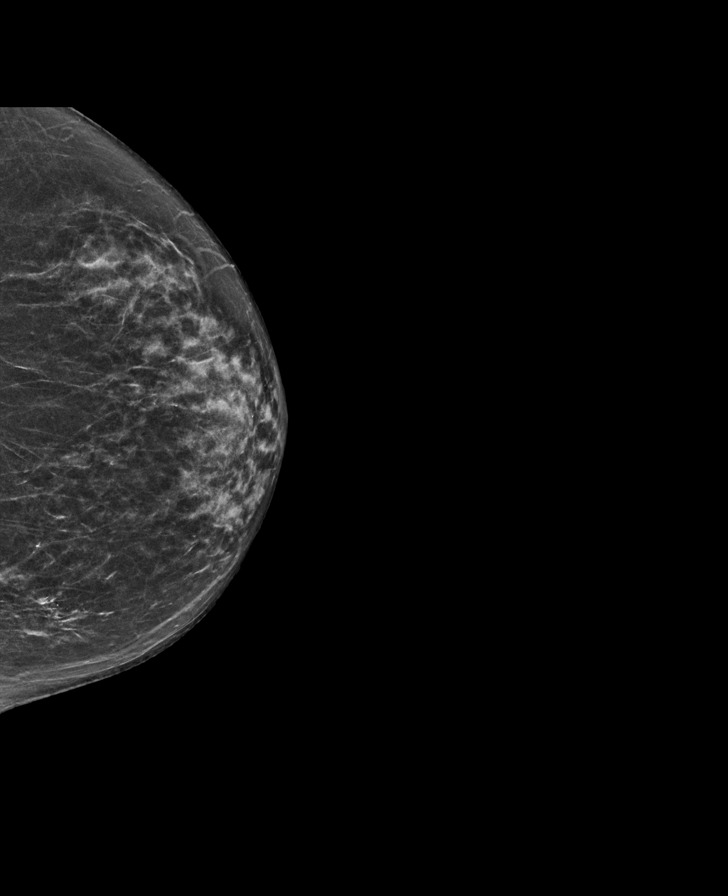

[R CC tomo · tomo slice 29/56.0]
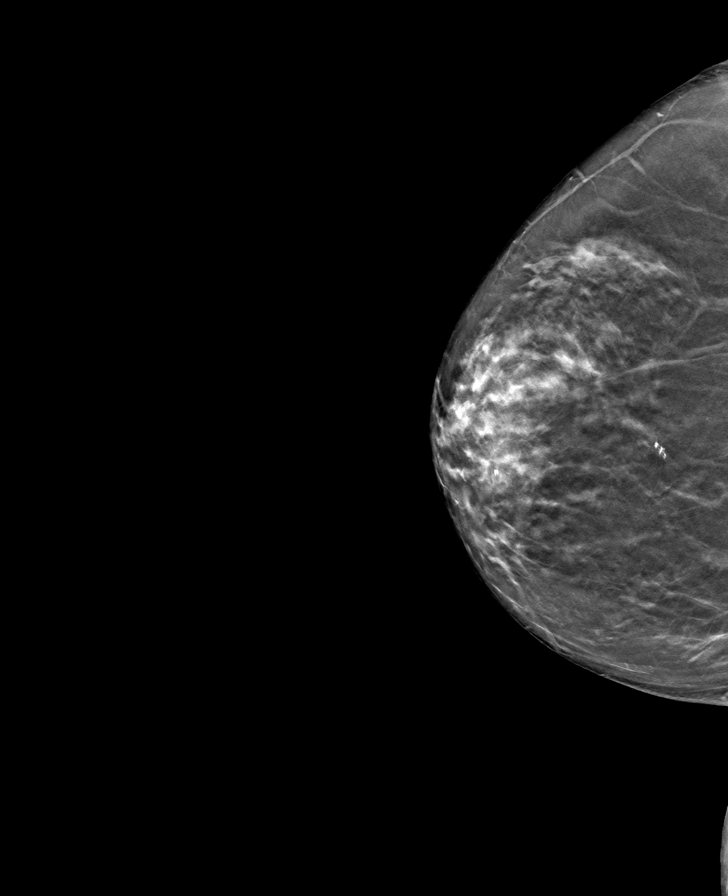

[L MLO tomo · tomo slice 28/55.0]
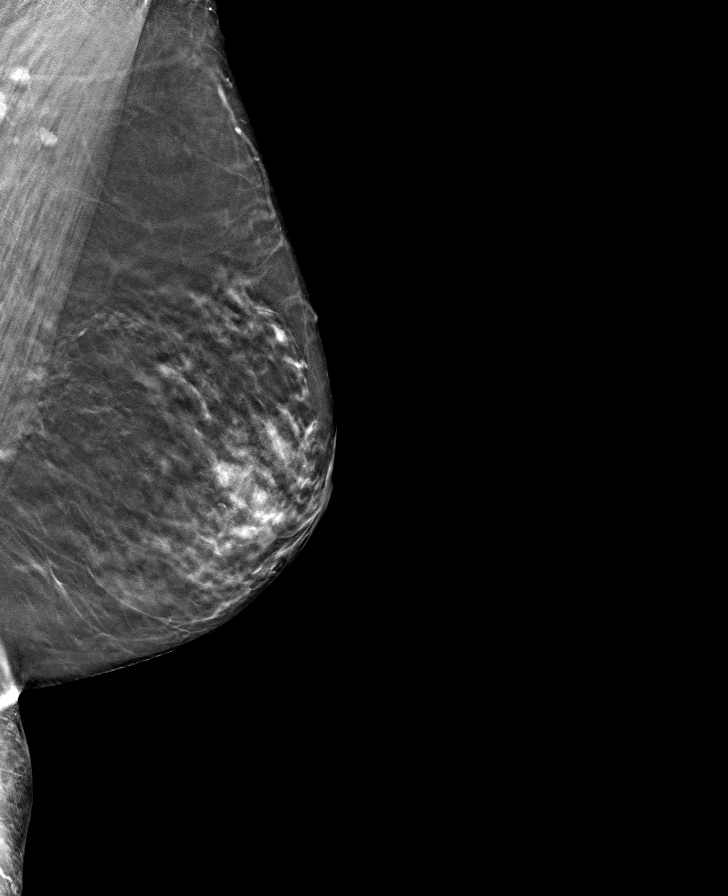

[R MLO tomo · tomo slice 28/55.0]
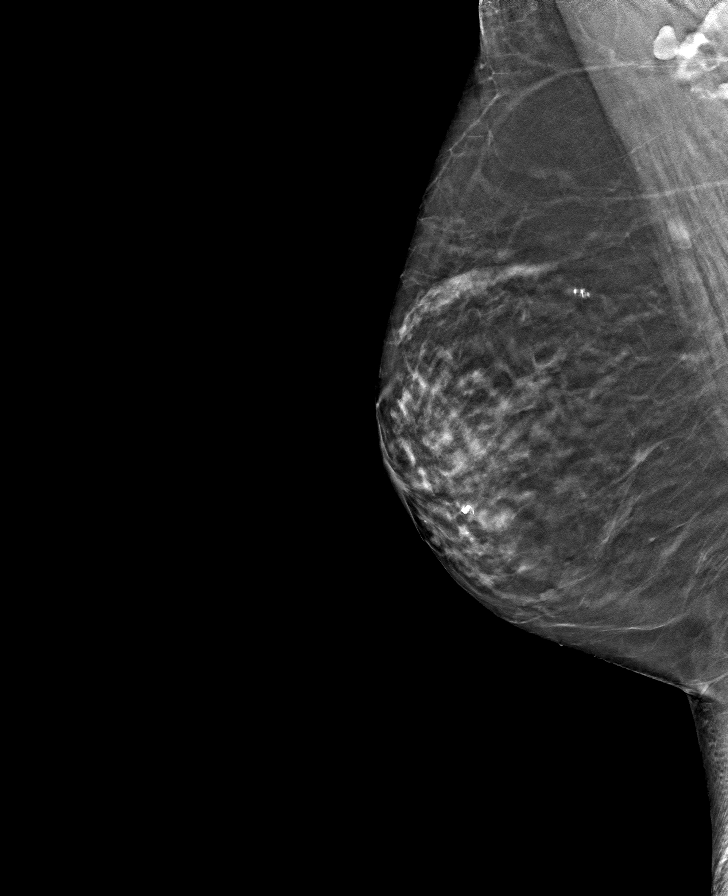

[L CC tomo · tomo slice 27/52.0]
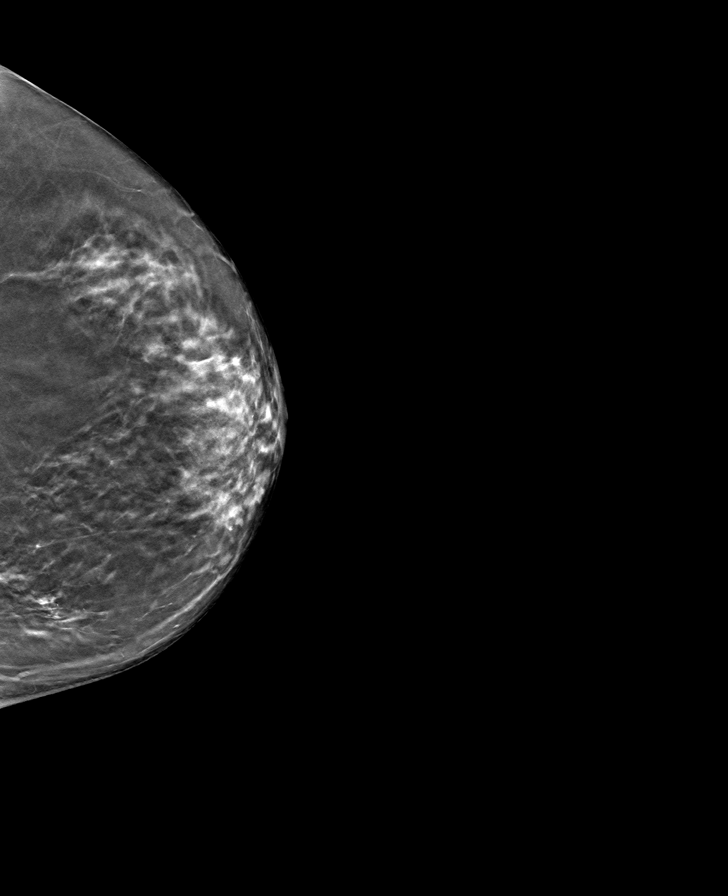

[8 of 24 positions shown; findings below may reference images not displayed]

ACR Breast Density Category c: The breast tissue is heterogeneously
dense, which may obscure small masses.
FINDINGS: In the right breast a group of calcifications requires further
evaluation.

In the left breast a possible group of calcifications requires
further evaluation.
IMPRESSION: Further evaluation is suggested for calcifications in the right
breast.

Further evaluation is suggested for possible calcifications in the
left breast.

RECOMMENDATION:
Diagnostic mammogram and possibly ultrasound of both breasts.
(Code:[id number])

The patient will be contacted regarding the findings, and additional
imaging will be scheduled.

BI-RADS CATEGORY  0: Incomplete. Need additional imaging evaluation
and/or prior mammograms for comparison.

## 2023-06-14 NOTE — Anesthesia Preprocedure Evaluation (Signed)
Anesthesia Evaluation  Patient identified by MRN, date of birth, ID band Patient awake    Reviewed: Allergy & Precautions, NPO status , Patient's Chart, lab work & pertinent test results  History of Anesthesia Complications Negative for: history of anesthetic complications  Airway Mallampati: II  TM Distance: >3 FB Neck ROM: Full    Dental  (+) Edentulous Lower, Edentulous Upper   Pulmonary Current Smoker and Patient abstained from smoking.   Pulmonary exam normal        Cardiovascular hypertension, Pt. on medications Normal cardiovascular exam     Neuro/Psych   Anxiety Depression       GI/Hepatic ,GERD  Medicated,,(+) Cirrhosis     substance abuse  alcohol useGallstones   Endo/Other  negative endocrine ROS    Renal/GU negative Renal ROS  negative genitourinary   Musculoskeletal  (+) Arthritis ,    Abdominal   Peds  Hematology negative hematology ROS (+)   Anesthesia Other Findings Day of surgery medications reviewed with patient.  Reproductive/Obstetrics                             Anesthesia Physical Anesthesia Plan  ASA: 3  Anesthesia Plan: General   Post-op Pain Management: Tylenol PO (pre-op)* and Toradol IV (intra-op)*   Induction: Intravenous  PONV Risk Score and Plan: 3 and Treatment may vary due to age or medical condition, Midazolam, Ondansetron, Dexamethasone and Scopolamine patch - Pre-op  Airway Management Planned: Oral ETT  Additional Equipment: None  Intra-op Plan:   Post-operative Plan: Extubation in OR  Informed Consent: I have reviewed the patients History and Physical, chart, labs and discussed the procedure including the risks, benefits and alternatives for the proposed anesthesia with the patient or authorized representative who has indicated his/her understanding and acceptance.     Dental advisory given  Plan Discussed with: CRNA  Anesthesia  Plan Comments:         Anesthesia Quick Evaluation

## 2023-06-15 ENCOUNTER — Ambulatory Visit (HOSPITAL_BASED_OUTPATIENT_CLINIC_OR_DEPARTMENT_OTHER): Payer: MEDICAID

## 2023-06-15 ENCOUNTER — Ambulatory Visit (HOSPITAL_COMMUNITY): Payer: MEDICAID

## 2023-06-15 ENCOUNTER — Other Ambulatory Visit: Payer: Self-pay

## 2023-06-15 ENCOUNTER — Encounter (HOSPITAL_COMMUNITY)
Admission: RE | Disposition: A | Discharge status: Home / Self Care | Payer: Self-pay | Source: Ambulatory Visit | Attending: Surgery

## 2023-06-15 ENCOUNTER — Ambulatory Visit (HOSPITAL_COMMUNITY)
Admission: RE | Admit: 2023-06-15 | Discharge: 2023-06-15 | Disposition: A | Discharge status: Home / Self Care | Payer: MEDICAID | Source: Ambulatory Visit | Attending: Surgery | Admitting: Surgery

## 2023-06-15 ENCOUNTER — Encounter (HOSPITAL_COMMUNITY): Payer: Self-pay | Admitting: Surgery

## 2023-06-15 DIAGNOSIS — K7030 Alcoholic cirrhosis of liver without ascites: Secondary | ICD-10-CM | POA: Insufficient documentation

## 2023-06-15 DIAGNOSIS — I10 Essential (primary) hypertension: Secondary | ICD-10-CM

## 2023-06-15 DIAGNOSIS — K219 Gastro-esophageal reflux disease without esophagitis: Secondary | ICD-10-CM | POA: Diagnosis not present

## 2023-06-15 DIAGNOSIS — Z8249 Family history of ischemic heart disease and other diseases of the circulatory system: Secondary | ICD-10-CM | POA: Diagnosis not present

## 2023-06-15 DIAGNOSIS — K8020 Calculus of gallbladder without cholecystitis without obstruction: Secondary | ICD-10-CM | POA: Diagnosis present

## 2023-06-15 DIAGNOSIS — F17200 Nicotine dependence, unspecified, uncomplicated: Secondary | ICD-10-CM | POA: Diagnosis not present

## 2023-06-15 DIAGNOSIS — K8010 Calculus of gallbladder with chronic cholecystitis without obstruction: Secondary | ICD-10-CM | POA: Diagnosis not present

## 2023-06-15 HISTORY — PX: CHOLECYSTECTOMY: SHX55

## 2023-06-15 SURGERY — LAPAROSCOPIC CHOLECYSTECTOMY
Anesthesia: General | Site: Abdomen

## 2023-06-15 MED ORDER — SCOPOLAMINE 1 MG/3DAYS TD PT72
1.0000 | MEDICATED_PATCH | Freq: Once | TRANSDERMAL | Status: DC
  Administered 2023-06-15: 1.5 mg via TRANSDERMAL
  Filled 2023-06-15: qty 1

## 2023-06-15 MED ORDER — ORAL CARE MOUTH RINSE: 15.0000 mL | Freq: Once | OROMUCOSAL | Status: AC

## 2023-06-15 MED ORDER — OXYCODONE HCL 5 MG/5ML PO SOLN: 5.0000 mg | Freq: Once | ORAL | Status: AC | PRN

## 2023-06-15 MED ORDER — ROCURONIUM BROMIDE 100 MG/10ML IV SOLN
INTRAVENOUS | Status: DC | PRN
  Administered 2023-06-15: 40 mg via INTRAVENOUS

## 2023-06-15 MED ORDER — FENTANYL CITRATE PF 50 MCG/ML IJ SOSY
25.0000 ug | PREFILLED_SYRINGE | INTRAMUSCULAR | Status: DC | PRN
  Administered 2023-06-15: 50 ug via INTRAVENOUS

## 2023-06-15 MED ORDER — MIDAZOLAM HCL 2 MG/2ML IJ SOLN
INTRAMUSCULAR | Status: AC
  Filled 2023-06-15: qty 2

## 2023-06-15 MED ORDER — FENTANYL CITRATE (PF) 100 MCG/2ML IJ SOLN
INTRAMUSCULAR | Status: DC | PRN
  Administered 2023-06-15: 100 ug via INTRAVENOUS

## 2023-06-15 MED ORDER — OXYCODONE HCL 5 MG PO TABS
ORAL_TABLET | ORAL | Status: AC
  Filled 2023-06-15: qty 1

## 2023-06-15 MED ORDER — LACTATED RINGERS IV SOLN: INTRAVENOUS | Status: DC

## 2023-06-15 MED ORDER — LABETALOL HCL 5 MG/ML IV SOLN
INTRAVENOUS | Status: DC | PRN
  Administered 2023-06-15: 5 mg via INTRAVENOUS

## 2023-06-15 MED ORDER — CEFAZOLIN SODIUM-DEXTROSE 2-4 GM/100ML-% IV SOLN
2.0000 g | INTRAVENOUS | Status: AC
  Administered 2023-06-15: 2 g via INTRAVENOUS
  Filled 2023-06-15: qty 100

## 2023-06-15 MED ORDER — PROPOFOL 10 MG/ML IV BOLUS
INTRAVENOUS | Status: DC | PRN
  Administered 2023-06-15: 140 mg via INTRAVENOUS
  Administered 2023-06-15: 40 mg via INTRAVENOUS

## 2023-06-15 MED ORDER — CHLORHEXIDINE GLUCONATE 0.12 % MT SOLN
15.0000 mL | Freq: Once | OROMUCOSAL | Status: AC
  Administered 2023-06-15: 15 mL via OROMUCOSAL

## 2023-06-15 MED ORDER — LACTATED RINGERS IR SOLN
Status: DC | PRN
  Administered 2023-06-15: 1000 mL

## 2023-06-15 MED ORDER — ROCURONIUM BROMIDE 10 MG/ML (PF) SYRINGE
PREFILLED_SYRINGE | INTRAVENOUS | Status: AC
  Filled 2023-06-15: qty 10

## 2023-06-15 MED ORDER — ONDANSETRON HCL 4 MG/2ML IJ SOLN
INTRAMUSCULAR | Status: DC | PRN
  Administered 2023-06-15: 4 mg via INTRAVENOUS

## 2023-06-15 MED ORDER — LABETALOL HCL 5 MG/ML IV SOLN
INTRAVENOUS | Status: AC
  Filled 2023-06-15: qty 4

## 2023-06-15 MED ORDER — DEXAMETHASONE SODIUM PHOSPHATE 10 MG/ML IJ SOLN
INTRAMUSCULAR | Status: AC
  Filled 2023-06-15: qty 1

## 2023-06-15 MED ORDER — SUGAMMADEX SODIUM 200 MG/2ML IV SOLN
INTRAVENOUS | Status: DC | PRN
  Administered 2023-06-15: 200 mg via INTRAVENOUS

## 2023-06-15 MED ORDER — FENTANYL CITRATE PF 50 MCG/ML IJ SOSY
PREFILLED_SYRINGE | INTRAMUSCULAR | Status: AC
  Filled 2023-06-15: qty 2

## 2023-06-15 MED ORDER — KETAMINE HCL 50 MG/5ML IJ SOSY
PREFILLED_SYRINGE | INTRAMUSCULAR | Status: AC
  Filled 2023-06-15: qty 5

## 2023-06-15 MED ORDER — BUPIVACAINE-EPINEPHRINE 0.25% -1:200000 IJ SOLN
INTRAMUSCULAR | Status: DC | PRN
  Administered 2023-06-15: 25 mL

## 2023-06-15 MED ORDER — OXYCODONE HCL 5 MG PO TABS
5.0000 mg | ORAL_TABLET | Freq: Once | ORAL | Status: AC | PRN
  Administered 2023-06-15: 5 mg via ORAL

## 2023-06-15 MED ORDER — FENTANYL CITRATE (PF) 100 MCG/2ML IJ SOLN
INTRAMUSCULAR | Status: AC
  Filled 2023-06-15: qty 2

## 2023-06-15 MED ORDER — ONDANSETRON HCL 4 MG/2ML IJ SOLN
INTRAMUSCULAR | Status: AC
  Filled 2023-06-15: qty 2

## 2023-06-15 MED ORDER — ACETAMINOPHEN 325 MG PO TABS
650.0000 mg | ORAL_TABLET | Freq: Once | ORAL | Status: AC
  Administered 2023-06-15: 650 mg via ORAL
  Filled 2023-06-15: qty 2

## 2023-06-15 MED ORDER — LIDOCAINE HCL (PF) 2 % IJ SOLN
INTRAMUSCULAR | Status: AC
  Filled 2023-06-15: qty 5

## 2023-06-15 MED ORDER — PROPOFOL 10 MG/ML IV BOLUS
INTRAVENOUS | Status: AC
  Filled 2023-06-15: qty 20

## 2023-06-15 MED ORDER — DEXAMETHASONE SODIUM PHOSPHATE 10 MG/ML IJ SOLN
INTRAMUSCULAR | Status: DC | PRN
  Administered 2023-06-15: 8 mg via INTRAVENOUS

## 2023-06-15 MED ORDER — BUPIVACAINE-EPINEPHRINE 0.25% -1:200000 IJ SOLN
INTRAMUSCULAR | Status: AC
  Filled 2023-06-15: qty 1

## 2023-06-15 MED ORDER — 0.9 % SODIUM CHLORIDE (POUR BTL) OPTIME
TOPICAL | Status: DC | PRN
  Administered 2023-06-15: 1000 mL

## 2023-06-15 MED ORDER — FENTANYL CITRATE PF 50 MCG/ML IJ SOSY
PREFILLED_SYRINGE | INTRAMUSCULAR | Status: AC
  Administered 2023-06-15: 50 ug via INTRAVENOUS
  Filled 2023-06-15: qty 1

## 2023-06-15 MED ORDER — DROPERIDOL 2.5 MG/ML IJ SOLN: 0.6250 mg | Freq: Once | INTRAMUSCULAR | Status: DC | PRN

## 2023-06-15 MED ORDER — OXYCODONE HCL 5 MG PO TABS: 5.0000 mg | ORAL_TABLET | Freq: Four times a day (QID) | ORAL | 0 refills | Status: DC | PRN

## 2023-06-15 MED ORDER — LIDOCAINE HCL (CARDIAC) PF 100 MG/5ML IV SOSY
PREFILLED_SYRINGE | INTRAVENOUS | Status: DC | PRN
  Administered 2023-06-15: 80 mg via INTRAVENOUS

## 2023-06-15 MED ORDER — MIDAZOLAM HCL 5 MG/5ML IJ SOLN
INTRAMUSCULAR | Status: DC | PRN
  Administered 2023-06-15: 2 mg via INTRAVENOUS

## 2023-06-15 MED ORDER — GLYCOPYRROLATE 0.2 MG/ML IJ SOLN
INTRAMUSCULAR | Status: AC
  Filled 2023-06-15: qty 1

## 2023-06-15 SURGICAL SUPPLY — 46 items
ADH SKN CLS APL DERMABOND .7 (GAUZE/BANDAGES/DRESSINGS) ×1
APL PRP STRL LF DISP 70% ISPRP (MISCELLANEOUS) ×1
APPLIER CLIP 5 13 M/L LIGAMAX5 (MISCELLANEOUS) ×1
APR CLP MED LRG 5 ANG JAW (MISCELLANEOUS) ×1
BAG COUNTER SPONGE SURGICOUNT (BAG) IMPLANT
BAG SPEC RTRVL 10 TROC 200 (ENDOMECHANICALS)
BAG SPNG CNTER NS LX DISP (BAG)
CHLORAPREP W/TINT 26 (MISCELLANEOUS) ×1 IMPLANT
CLIP APPLIE 5 13 M/L LIGAMAX5 (MISCELLANEOUS) ×1 IMPLANT
COVER MAYO STAND XLG (MISCELLANEOUS) ×1 IMPLANT
COVER SURGICAL LIGHT HANDLE (MISCELLANEOUS) ×1 IMPLANT
DERMABOND ADVANCED .7 DNX12 (GAUZE/BANDAGES/DRESSINGS) ×1 IMPLANT
DRAPE C-ARM 42X120 X-RAY (DRAPES) IMPLANT
ELECT L-HOOK LAP 45CM DISP (ELECTROSURGICAL)
ELECT PENCIL ROCKER SW 15FT (MISCELLANEOUS) ×1 IMPLANT
ELECT REM PT RETURN 15FT ADLT (MISCELLANEOUS) ×1 IMPLANT
ELECTRODE L-HOOK LAP 45CM DISP (ELECTROSURGICAL) IMPLANT
GLOVE BIOGEL PI IND STRL 6 (GLOVE) ×1 IMPLANT
GLOVE BIOGEL PI MICRO STRL 5.5 (GLOVE) ×1 IMPLANT
GOWN STRL REUS W/ TWL LRG LVL3 (GOWN DISPOSABLE) ×1 IMPLANT
GOWN STRL REUS W/TWL LRG LVL3 (GOWN DISPOSABLE) ×1
GRASPER SUT TROCAR 14GX15 (MISCELLANEOUS) IMPLANT
HEMOSTAT SNOW SURGICEL 2X4 (HEMOSTASIS) IMPLANT
IRRIG SUCT STRYKERFLOW 2 WTIP (MISCELLANEOUS) ×1
IRRIGATION SUCT STRKRFLW 2 WTP (MISCELLANEOUS) ×1 IMPLANT
KIT BASIN OR (CUSTOM PROCEDURE TRAY) ×1 IMPLANT
KIT TURNOVER KIT A (KITS) IMPLANT
L-HOOK LAP DISP 36CM (ELECTROSURGICAL) ×1
LHOOK LAP DISP 36CM (ELECTROSURGICAL) ×1 IMPLANT
NDL INSUFFLATION 14GA 120MM (NEEDLE) IMPLANT
NEEDLE INSUFFLATION 14GA 120MM (NEEDLE)
POUCH RETRIEVAL ECOSAC 10 (ENDOMECHANICALS) IMPLANT
SCISSORS LAP 5X35 DISP (ENDOMECHANICALS) ×1 IMPLANT
SET CHOLANGIOGRAPH MIX (MISCELLANEOUS) IMPLANT
SET TUBE SMOKE EVAC HIGH FLOW (TUBING) ×1 IMPLANT
SLEEVE Z-THREAD 5X100MM (TROCAR) ×2 IMPLANT
SPIKE FLUID TRANSFER (MISCELLANEOUS) ×1 IMPLANT
SUT MNCRL AB 4-0 PS2 18 (SUTURE) ×1 IMPLANT
SYS BAG RETRIEVAL 10MM (BASKET) ×1
SYSTEM BAG RETRIEVAL 10MM (BASKET) IMPLANT
TOWEL OR 17X26 10 PK STRL BLUE (TOWEL DISPOSABLE) ×1 IMPLANT
TOWEL OR NON WOVEN STRL DISP B (DISPOSABLE) IMPLANT
TRAY LAPAROSCOPIC (CUSTOM PROCEDURE TRAY) ×1 IMPLANT
TROCAR ADV FIXATION 12X100MM (TROCAR) IMPLANT
TROCAR BALLN 12MMX100 BLUNT (TROCAR) IMPLANT
TROCAR Z-THREAD OPTICAL 5X100M (TROCAR) ×1 IMPLANT

## 2023-06-15 NOTE — Discharge Instructions (Addendum)
   [facility] DISCHARGE INSTRUCTIONS  Activity No heavy lifting greater than 15 pounds for 4 weeks after surgery. Ok to shower in 24 hours, but do not bathe or submerge incisions underwater. Do not drive while taking narcotic pain medication. You may drive when you are no longer taking prescription pain medication, you can comfortably wear a seatbelt, and you can safely maneuver your car and apply brakes.  Wound Care Your incisions are covered with skin glue called Dermabond. This will peel off on its own over time. You may shower and allow warm soapy water to run over your incisions. Gently pat dry. Do not submerge your incision underwater until cleared by your surgeon. Monitor your incision for any new redness, tenderness, or drainage. Many patients will experience some swelling and bruising at the incisions.  Ice packs will help.  Swelling and bruising can take several days to resolve.   Medications A  prescription for pain medication may be given to you upon discharge.  Take your pain medication as prescribed, if needed.  If narcotic pain medicine is not needed, then you may take ibuprofen (Advil) as needed. It is common to experience some constipation if taking pain medication after surgery.  Increasing fluid intake and taking a stool softener (such as Colace) will usually help or prevent this problem from occurring.  A mild laxative (Milk of Magnesia or Miralax) should be taken according to package directions if there are no bowel movements after 48 hours. Take your usually prescribed medications unless otherwise directed. If you need a refill on your pain medication, please contact your pharmacy.  They will contact our office to request authorization. Prescriptions will not be filled after 5 pm or on weekends.  When to Call us: Fever greater than 100.5 New redness, drainage, or swelling at incision site Severe pain, nausea, or vomiting Persistent bleeding from  incisions Jaundice (yellowing of the whites of the eyes or skin)  Follow-up You have an appointment scheduled with Dr. Freida Busman on 07/13/23 at 3:00pm. This will be at the [facility] office at [address], [place], [place]. Please arrive at least 15 minutes prior to your scheduled appointment time.  IF YOU HAVE DISABILITY OR FAMILY LEAVE FORMS, YOU MUST BRING THEM TO THE OFFICE FOR PROCESSING.   DO NOT GIVE THEM TO YOUR DOCTOR.  The clinic staff is available to answer your questions during regular business hours.  Please don't hesitate to call and ask to speak to one of the nurses for clinical concerns.  If you have a medical emergency, go to the nearest emergency room or call 911.  A surgeon from [facility] is always on call at the hospital  [address], [address], [place], [place]  [address] ?  [address] [address], [place], [place]   [address] [phone] ? Toll Free: [phone] ? FAX [phone] Web site: [email or web address]

## 2023-06-15 NOTE — Anesthesia Postprocedure Evaluation (Signed)
Anesthesia Post Note  Patient: [name]  Procedure(s) Performed: LAPAROSCOPIC CHOLECYSTECTOMY (Abdomen)     Patient location during evaluation: PACU Anesthesia Type: General Level of consciousness: awake and alert Pain management: pain level controlled Vital Signs Assessment: post-procedure vital signs reviewed and stable Respiratory status: spontaneous breathing, nonlabored ventilation and respiratory function stable Cardiovascular status: blood pressure returned to baseline Postop Assessment: no apparent nausea or vomiting Anesthetic complications: no   No notable events documented.  Last Vitals:  Vitals:   06/15/23 0930 06/15/23 0950  BP: (!) 150/94 (!) 145/91  Pulse: 64 (!) 57  Resp: 15   Temp: 36.7 C 36.5 C  SpO2: 98% 95%    Last Pain:  Vitals:   06/15/23 0950  TempSrc:   PainSc: 5                  Shanda Howells

## 2023-06-15 NOTE — Transfer of Care (Signed)
Immediate Anesthesia Transfer of Care Note  Patient: [name]  Procedure(s) Performed: LAPAROSCOPIC CHOLECYSTECTOMY (Abdomen)  Patient Location: PACU  Anesthesia Type:General  Level of Consciousness: awake, alert , oriented, and patient cooperative  Airway & Oxygen Therapy: Patient Spontanous Breathing and Patient connected to face mask oxygen  Post-op Assessment: Report given to RN and Post -op Vital signs reviewed and stable  Post vital signs: Reviewed and stable  Last Vitals:  Vitals Value Taken Time  BP 164/104 06/15/23 0831  Temp    Pulse 58 06/15/23 0832  Resp 16 06/15/23 0832  SpO2 98 % 06/15/23 0832  Vitals shown include unfiled device data.  Last Pain:  Vitals:   06/15/23 0630  TempSrc:   PainSc: 0-No pain      Patients Stated Pain Goal: 6 (06/15/23 0630)  Complications: No notable events documented.

## 2023-06-15 NOTE — Op Note (Signed)
Date: 06/15/23  Patient: [name] MRN: [id number]  Preoperative Diagnosis: Symptomatic cholelithiasis Postoperative Diagnosis: Same  Procedure: Laparoscopic cholecystectomy  Surgeon: Sophronia Simas, MD Assistant: Carman Ching, MD  EBL: 20 mL  Anesthesia: General endotracheal  Specimens: Gallbladder  Indications: 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 An ultrasound confirmed cholelithiasis. After a discussion of the risks and benefits of surgery, she agreed to proceed with cholecystectomy.  Findings: Cholelithiasis without signs of acute cholecystitis. Cirrhotic liver.  Procedure details: Informed consent was obtained in the preoperative area prior to the procedure. The patient was brought to the operating room and placed on the table in the supine position. General anesthesia was induced and appropriate lines and drains were placed for intraoperative monitoring. Perioperative antibiotics were administered per SCIP guidelines. The abdomen was prepped and draped in the usual sterile fashion. A pre-procedure timeout was taken verifying patient identity, surgical site and procedure to be performed.  A small infraumbilical skin incision was made, the subcutaneous tissue was divided with cautery, and the umbilical stalk was grasped and elevated. The fascia was incised and the peritoneal cavity was directly visualized. A 12mm Hassan trocar was placed and the abdomen was insufflated. The peritoneal cavity was inspected with no evidence of visceral or vascular injury. Three 5mm ports were placed in the right subcostal margin, all under direct visualization. The fundus of the gallbladder was grasped and retracted cephalad. There were omental adhesions to the gallbladder, which were taken down with cautery. Medially, the duodenum was adherent to the gallbladder via thin filmy adhesions, which were carefully lysed sharply.  Further adhesions to the gallbladder were bluntly swept away. The infundibulum was then retracted laterally. The peritoneum overlying the gallbladder was incised and the cystic triangle was dissected out using cautery and blunt dissection. There was bleeding from a branch of the cystic artery on the gallbladder, which was controlled with placement of a clip. The cystic duct and cystic artery were further dissected out circumferentially and the critical view of safety was obtained. The cystic duct and cystic artery were clipped and ligated, leaving two clips behind on the cystic duct stump. The gallbladder was taken off the liver using cautery, and the specimen was placed in an endocatch bag. The surgical site was irrigated with saline until the effluent was clear. Hemostasis was achieved in the gallbladder fossa using cautery. There was no evidence of bleeding or bile leak from the cystic duct and artery stumps. The ports were removed under direct visualization and the abdomen was desufflated. The specimen was extracted via the umbilical port site and sent for routine pathology. The umbilical port site fascia was closed with a 0 vicryl pursestring suture. The skin at all port sites was closed with 4-0 monocryl subcuticular suture. Dermabond was applied.  The patient tolerated the procedure well with no apparent complications. All counts were correct x2 at the end of the procedure. The patient was extubated and taken to PACU in stable condition.  Sophronia Simas, MD 06/15/23 8:31 AM

## 2023-06-15 NOTE — Anesthesia Procedure Notes (Signed)
Procedure Name: Intubation Date/Time: 06/15/2023 7:28 AM  Performed by: Lezlie Lye, CRNAPre-anesthesia Checklist: Patient identified, Emergency Drugs available, Suction available and Patient being monitored Patient Re-evaluated:Patient Re-evaluated prior to induction Oxygen Delivery Method: Circle System Utilized Preoxygenation: Pre-oxygenation with 100% oxygen Induction Type: IV induction Ventilation: Mask ventilation without difficulty Laryngoscope Size: Miller and 3 Grade View: Grade I Tube type: Oral Tube size: 7.0 mm Number of attempts: 1 Airway Equipment and Method: Stylet Placement Confirmation: ETT inserted through vocal cords under direct vision, positive ETCO2 and breath sounds checked- equal and bilateral Secured at: 21 cm Tube secured with: Tape Dental Injury: Teeth and Oropharynx as per pre-operative assessment

## 2023-06-15 NOTE — H&P (Signed)
[name] is an 52 y.o. female.   Chief Complaint: abdominal pain HPI: [name] is a 52 yo female with a history of EtOH cirrhosis who was referred for gallstones. For about 6 months now, she has been having intermittent RUQ abdominal pain. The pain gets worse after eating, specifically greasy and spicy foods. It now occurs very frequently. She has a history of EtOH cirrhosis. LFTs are normal and last INR in April was 1.0. She follows with Dr. Myrtie Neither and has regular HCC screenings. Her most recent US on 7/17 showed cholelithiasis without signs of acute cholecystitis. She was referred to discuss cholecystectomy.  MELD 7 Child-Pugh class A    Past Medical History:  Diagnosis Date   Anxiety    Arthritis    right foot   Cirrhosis (HCC)    Depression    GERD (gastroesophageal reflux disease)    History of alcoholism (HCC)    History of cellulitis    2015  right side face   Hypertension    followed by pcp  (per pt never had stress test)   Malnutrition (HCC)    Metatarsal fracture    right 2nd   Ovarian cyst    Peripheral neuropathy    feet   Strachan's syndrome    painful neuropathy   Wears dentures    upper   Wears glasses     Past Surgical History:  Procedure Laterality Date   ABDOMINAL HYSTERECTOMY  2002   Partial hysterectomy,   BREAST BIOPSY Right 2023   MULTIPLE EXTRACTIONS WITH ALVEOLOPLASTY N/A 12/25/2013   Procedure: Extraction of tooth #'s 2,3,4,5,6,8,9,10,11,12,13,14,15,20,21,22,23,24,25,26,27, 84,16,60,63 with alveoloplasty and bilateral mandibular tori reductions;  Surgeon: Charlynne Pander, DDS;  Location: [facility];  Service: Oral Surgery;  Laterality: N/A;   ORIF FOOT FRACTURE Right    multiple  metatarsal fxs   ORIF TOE FRACTURE Right 07/03/2019   Procedure: OPEN REDUCTION INTERNAL FIXATION (ORIF) METATARSAL (TOE) FRACTURE;  Surgeon: Vivi Barrack, [name];  Location: [facility];  Service: Podiatry;  Laterality: Right;  LEG BLOCK   SURAL NERVE  BX Right 12/24/2013   Procedure: SURAL NERVE BIOPSY;  Surgeon: Barnett Abu, MD;  Location: [facility];  Service: Neurosurgery;  Laterality: Right;   TUBAL LIGATION Bilateral 2002   PPTL    Family History  Adopted: Yes  Problem Relation Age of Onset   Hypertension Mother    Raynaud syndrome Mother    Diabetes Father    Drug abuse Maternal Grandmother    Cancer Maternal Grandmother        intestinal   Hypertension Maternal Grandmother    Lung cancer Maternal Grandfather    Hypertension Paternal Grandfather    Diabetes Maternal Aunt    Heart disease Maternal Aunt    Diabetes Maternal Uncle    Heart disease Maternal Uncle    Colon cancer Neg Hx    Colon polyps Neg Hx    Esophageal cancer Neg Hx    Rectal cancer Neg Hx    Stomach cancer Neg Hx    Breast cancer Neg Hx    Ovarian cancer Neg Hx    Endometrial cancer Neg Hx    Social History:  reports that she has been smoking cigarettes. She has a 10 pack-year smoking history. She has been exposed to tobacco smoke. She has never used smokeless tobacco. She reports current alcohol use. She reports that she does not currently use drugs.  Allergies:  Allergies  Allergen Reactions  Cymbalta [Duloxetine Hcl] Other (See Comments)    Avoid due to Cirrhosis Pt on 6 week taper   Macrobid [Nitrofurantoin Macrocrystal] Anaphylaxis and Other (See Comments)    Patient reports 2 syncope episodes after her mouth became numb    Medications Prior to Admission  Medication Sig Dispense Refill   amLODipine (NORVASC) 5 MG tablet Take 1 tablet (5 mg total) by mouth daily. 90 tablet 1   atorvastatin (LIPITOR) 10 MG tablet Take 1 tablet (10 mg total) by mouth daily. 90 tablet 1   buPROPion (WELLBUTRIN SR) 200 MG 12 hr tablet Take 1 tablet (200 mg total) by mouth 2 (two) times daily. 180 tablet 1   busPIRone (BUSPAR) 30 MG tablet Take 1 tablet (30 mg total) by mouth in the morning and at bedtime. 180 tablet 1   Cyanocobalamin (B-12 PO) Take 1  tablet by mouth daily.     FOLIC ACID PO Take 1 capsule by mouth daily.     Multiple Vitamins-Minerals (MULTIVITAMIN WITH MINERALS) tablet Take 1 tablet by mouth daily.     omeprazole (PRILOSEC) 20 MG capsule Take 1 capsule (20 mg total) by mouth daily. 90 capsule 1   pregabalin (LYRICA) 150 MG capsule TAKE 1 CAPSULE BY MOUTH 3 TIMES DAILY. 90 capsule 2   Thiamine HCl (B-1 PO) Take 1 capsule by mouth daily.     traZODone (DESYREL) 100 MG tablet Take 0.5 tablets (50 mg total) by mouth at bedtime. 90 tablet 1    No results found for this or any previous visit (from the past 48 hour(s)). No results found.  Review of Systems  Blood pressure 130/83, pulse (!) 58, temperature 98.3 F (36.8 C), temperature source Oral, resp. rate 13, height 5\' 4"  (1.626 m), weight 58.1 kg, SpO2 96%. Physical Exam Vitals reviewed.  Constitutional:      General: She is not in acute distress.    Appearance: Normal appearance.  HENT:     Head: Normocephalic and atraumatic.  Eyes:     General: No scleral icterus.    Conjunctiva/sclera: Conjunctivae normal.  Cardiovascular:     Rate and Rhythm: Normal rate and regular rhythm.  Pulmonary:     Effort: Pulmonary effort is normal. No respiratory distress.  Abdominal:     General: There is no distension.     Palpations: Abdomen is soft.  Skin:    General: Skin is warm and dry.     Coloration: Skin is not jaundiced.  Neurological:     General: No focal deficit present.     Mental Status: She is alert and oriented to person, place, and time.      Assessment/Plan 52 yo female with symptomatic cholelithiasis and compensated cirrhosis. Proceed to the OR for laparoscopic cholecystectomy. Informed consent obtained, all questions answered. Plan for discharge home postoperatively.  Bethany Mandes, MD 06/15/2023, 7:15 AM

## 2023-06-16 ENCOUNTER — Encounter (HOSPITAL_COMMUNITY): Payer: Self-pay | Admitting: Surgery

## 2023-06-18 ENCOUNTER — Other Ambulatory Visit: Payer: Self-pay | Admitting: Family Medicine

## 2023-06-18 DIAGNOSIS — F419 Anxiety disorder, unspecified: Secondary | ICD-10-CM

## 2023-06-19 ENCOUNTER — Ambulatory Visit: Payer: Medicaid Other | Admitting: Neurology

## 2023-06-19 ENCOUNTER — Encounter: Payer: Self-pay | Admitting: Neurology

## 2023-06-19 LAB — SURGICAL PATHOLOGY

## 2023-06-19 NOTE — Telephone Encounter (Signed)
Requested Prescriptions  Pending Prescriptions Disp Refills   buPROPion (WELLBUTRIN SR) 200 MG 12 hr tablet [Pharmacy Med Name: BUPROPION HCL SR 200 MG TABLET] 180 tablet 1    Sig: TAKE 1 TABLET BY MOUTH TWICE A DAY     Psychiatry: Antidepressants - bupropion Failed - 06/18/2023  7:48 PM      Failed - Last BP in normal range    BP Readings from Last 1 Encounters:  06/15/23 (!) 145/91         Passed - Cr in normal range and within 360 days    Creatinine  Date Value Ref Range Status  01/09/2023 0.71 0.44 - 1.00 mg/dL Final   Creat  Date Value Ref Range Status  07/19/2016 0.70 0.50 - 1.10 mg/dL Final   Creatinine, Ser  Date Value Ref Range Status  05/31/2023 0.60 0.44 - 1.00 mg/dL Final   Creatinine, Urine  Date Value Ref Range Status  12/19/2013 69.70 mg/dL Final         Passed - AST in normal range and within 360 days    AST  Date Value Ref Range Status  05/31/2023 31 15 - 41 U/L Final  01/09/2023 20 15 - 41 U/L Final         Passed - ALT in normal range and within 360 days    ALT  Date Value Ref Range Status  05/31/2023 27 0 - 44 U/L Final  01/09/2023 16 0 - 44 U/L Final         Passed - Valid encounter within last 6 months    Recent Outpatient Visits           3 months ago Anxiety and depression   [facility] [place], Odette Horns, MD   7 months ago Elevated LFTs   [facility] [place], [place], [place]   1 year ago Hypokalemia   [facility] Hoy Register, MD   1 year ago Left upper quadrant abdominal mass   [facility] Hoy Register, MD   2 years ago Annual physical exam   [facility] Hoy Register, MD       Future Appointments             In 3 months Hoy Register, MD [facility]

## 2023-06-20 MED ORDER — PREGABALIN 150 MG PO CAPS: 150.0000 mg | ORAL_CAPSULE | Freq: Three times a day (TID) | ORAL | 0 refills | Status: DC

## 2023-06-27 NOTE — Progress Notes (Unsigned)
Follow-up Visit   Date: 06/28/23    SHALAWN WYNDER MRN: [id number] DOB: Apr 02, 1971   Interim History: [name] is a 52 y.o. right-handed Caucasian female with Darylene Price syndrome, alcoholic cirrhosis, tobacco use, hypertension, and GERD returning to the clinic for follow-up of nutritional ataxic neuropathy.  The patient was accompanied to the clinic by self.  She went to out-patient rehab for alcohol and has been sober since February 2024.  She is very pleased with her progress.  Neuropathy remains unchanged. She takes Lyrica 150mg  three times daily which help with painful paresthesias. No interval falls.  Her appetite is much better, also.   Medications:  Current Outpatient Medications on File Prior to Visit  Medication Sig Dispense Refill   amLODipine (NORVASC) 5 MG tablet Take 1 tablet (5 mg total) by mouth daily. 90 tablet 1   atorvastatin (LIPITOR) 10 MG tablet Take 1 tablet (10 mg total) by mouth daily. 90 tablet 1   buPROPion (WELLBUTRIN SR) 200 MG 12 hr tablet TAKE 1 TABLET BY MOUTH TWICE A DAY 180 tablet 1   busPIRone (BUSPAR) 30 MG tablet Take 1 tablet (30 mg total) by mouth in the morning and at bedtime. 180 tablet 1   Cyanocobalamin (B-12 PO) Take 1 tablet by mouth daily.     FOLIC ACID PO Take 1 capsule by mouth daily.     Multiple Vitamins-Minerals (MULTIVITAMIN WITH MINERALS) tablet Take 1 tablet by mouth daily.     omeprazole (PRILOSEC) 20 MG capsule Take 1 capsule (20 mg total) by mouth daily. 90 capsule 1   pregabalin (LYRICA) 150 MG capsule Take 1 capsule (150 mg total) by mouth 3 (three) times daily. 90 capsule 0   Thiamine HCl (B-1 PO) Take 1 capsule by mouth daily.     traZODone (DESYREL) 100 MG tablet Take 0.5 tablets (50 mg total) by mouth at bedtime. 90 tablet 1   No current facility-administered medications on file prior to visit.    Allergies:  Allergies  Allergen Reactions   Cymbalta [Duloxetine Hcl] Other (See Comments)    Avoid due to  Cirrhosis Pt on 6 week taper   Macrobid [Nitrofurantoin Macrocrystal] Anaphylaxis and Other (See Comments)    Patient reports 2 syncope episodes after her mouth became numb     Vital Signs:  BP 118/76   Pulse 65   Ht 5\' 4"  (1.626 m)   Wt 129 lb (58.5 kg)   LMP  (LMP Unknown)   SpO2 100%   BMI 22.14 kg/m    Neurological Exam: MENTAL STATUS including orientation to time, place, person, recent and remote memory, attention span and concentration, language, and fund of knowledge is normal.  Speech is not dysarthric.  MOTOR:  Motor strength is 5/5 in all extremities.  Generalized loss of muscle bulk throughout, fasciculations or abnormal movements.  No pronator drift.  Tone is normal.    MSRs:  Right                                                                 Left brachioradialis 2+  brachioradialis 2+  biceps 2+  biceps 2+  triceps 2+  triceps 2+  patellar 1+  Patellar 1+  ankle jerk 0  ankle jerk 0  SENSORY:  Intact to vibration is intact at the hands, absent below the knees.    COORDINATION/GAIT:   Gait appears mildly unsteady, unassisted.    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 EMG evaluation of the right lower extremity was relatively unremarkable. Labs 12/2013:  ANA negative. RPR nonreactive, HIV nonreactive, ESR only at 5, rheumatoid factor negative, copper 127, Vitamin B12 at 381. 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  Nutrient deficiency  - Continue  vitamin B12 , vitamin B1 100mg , and folic 1mg  daily  - Check levels at next visit  Return to clinic in 9 months    Thank you for allowing me to participate in patient's care.  If I can answer any additional questions, I would be pleased to do so.    Sincerely,    Talibah Colasurdo K. Allena Katz, DO

## 2023-06-28 ENCOUNTER — Encounter: Payer: Self-pay | Admitting: Neurology

## 2023-06-28 ENCOUNTER — Ambulatory Visit: Payer: MEDICAID | Admitting: Neurology

## 2023-06-28 VITALS — BP 118/76 | HR 65 | Ht 64.0 in | Wt 129.0 lb

## 2023-06-28 DIAGNOSIS — G6289 Other specified polyneuropathies: Secondary | ICD-10-CM | POA: Diagnosis not present

## 2023-06-28 DIAGNOSIS — G621 Alcoholic polyneuropathy: Secondary | ICD-10-CM | POA: Diagnosis not present

## 2023-06-28 MED ORDER — PREGABALIN 150 MG PO CAPS: 150.0000 mg | ORAL_CAPSULE | Freq: Three times a day (TID) | ORAL | 5 refills | Status: DC

## 2023-07-24 ENCOUNTER — Other Ambulatory Visit: Payer: Self-pay | Admitting: Neurology

## 2023-08-23 ENCOUNTER — Other Ambulatory Visit: Payer: Self-pay | Admitting: Family Medicine

## 2023-08-23 DIAGNOSIS — F419 Anxiety disorder, unspecified: Secondary | ICD-10-CM

## 2023-08-23 DIAGNOSIS — I10 Essential (primary) hypertension: Secondary | ICD-10-CM

## 2023-09-20 ENCOUNTER — Ambulatory Visit: Payer: MEDICAID | Attending: Family Medicine | Admitting: Family Medicine

## 2023-09-20 ENCOUNTER — Encounter: Payer: Self-pay | Admitting: Family Medicine

## 2023-09-20 VITALS — BP 134/76 | HR 73 | Ht 64.0 in | Wt 125.8 lb

## 2023-09-20 DIAGNOSIS — K7030 Alcoholic cirrhosis of liver without ascites: Secondary | ICD-10-CM

## 2023-09-20 DIAGNOSIS — F4312 Post-traumatic stress disorder, chronic: Secondary | ICD-10-CM

## 2023-09-20 DIAGNOSIS — Z23 Encounter for immunization: Secondary | ICD-10-CM | POA: Diagnosis not present

## 2023-09-20 DIAGNOSIS — I10 Essential (primary) hypertension: Secondary | ICD-10-CM | POA: Diagnosis not present

## 2023-09-20 DIAGNOSIS — E782 Mixed hyperlipidemia: Secondary | ICD-10-CM

## 2023-09-20 DIAGNOSIS — K219 Gastro-esophageal reflux disease without esophagitis: Secondary | ICD-10-CM

## 2023-09-20 DIAGNOSIS — N951 Menopausal and female climacteric states: Secondary | ICD-10-CM

## 2023-09-20 DIAGNOSIS — F419 Anxiety disorder, unspecified: Secondary | ICD-10-CM

## 2023-09-20 DIAGNOSIS — F32A Depression, unspecified: Secondary | ICD-10-CM

## 2023-09-20 DIAGNOSIS — F17210 Nicotine dependence, cigarettes, uncomplicated: Secondary | ICD-10-CM

## 2023-09-20 DIAGNOSIS — G4701 Insomnia due to medical condition: Secondary | ICD-10-CM

## 2023-09-20 MED ORDER — ATORVASTATIN CALCIUM 10 MG PO TABS
10.0000 mg | ORAL_TABLET | Freq: Every day | ORAL | 1 refills | Status: DC
Start: 2023-09-20 — End: 2023-10-05

## 2023-09-20 MED ORDER — BUPROPION HCL ER (SR) 200 MG PO TB12
200.0000 mg | ORAL_TABLET | Freq: Two times a day (BID) | ORAL | 1 refills | Status: DC
Start: 2023-09-20 — End: 2024-04-08

## 2023-09-20 MED ORDER — TRAZODONE HCL 150 MG PO TABS: 150.0000 mg | ORAL_TABLET | Freq: Every day | ORAL | 1 refills | Status: DC

## 2023-09-20 MED ORDER — AMLODIPINE BESYLATE 5 MG PO TABS
5.0000 mg | ORAL_TABLET | Freq: Every day | ORAL | 1 refills | Status: DC
Start: 2023-09-20 — End: 2024-02-06

## 2023-09-20 MED ORDER — OMEPRAZOLE 20 MG PO CPDR
20.0000 mg | DELAYED_RELEASE_CAPSULE | Freq: Every day | ORAL | 1 refills | Status: DC
Start: 2023-09-20 — End: 2024-04-08

## 2023-09-20 NOTE — Patient Instructions (Signed)
 VISIT SUMMARY:  During today's visit, we discussed your recent issues with insomnia and hot flashes, as well as your ongoing management of high cholesterol and high blood pressure. We also reviewed your general health maintenance, including necessary vaccinations and lab tests.  YOUR PLAN:  -INSOMNIA: Insomnia means having trouble sleeping. We will increase your Trazodone  dose to 150mg  at bedtime to help improve your sleep. If your hot flashes become bothersome, we may consider adding Paxil to your treatment plan.  -HYPERLIPIDEMIA: Hyperlipidemia means having high cholesterol levels. It is important to take your Atorvastatin  regularly to manage this condition. We will check your liver enzymes today and repeat these tests in one month to see how regular use of Atorvastatin  affects them.  -HYPERTENSION: Hypertension means high blood pressure. Your blood pressure is well controlled with Amlodipine , so you should continue taking this medication as prescribed.  -GENERAL HEALTH MAINTENANCE: We will administer your influenza vaccine today. We will also check your liver enzymes due to previous concerns about elevated levels, which may be related to your gallbladder issues. Please follow up in six months, and we will contact you with your lab results in the meantime.  INSTRUCTIONS:  Please follow up in six months for a routine check-up. We will contact you with your lab results once they are available. Continue taking your medications as prescribed and let us  know if you experience any new or worsening symptoms.

## 2023-09-20 NOTE — Progress Notes (Signed)
 Subjective:  Patient ID: [name], female    DOB: 06/29/1971  Age: 53 y.o. MRN: [id number]  CC: Medical Management of Chronic Issues   HPI NELDA LUCKEY is a 53 y.o. 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  Interval History: Discussed the use of AI scribe software for clinical note transcription with the patient, who gave verbal consent to proceed.  She presents with insomnia and hot flashes. The insomnia has been a recent issue, with the patient unable to sleep more than four hours at a time, even when taking Trazodone  The patient has been on trazodone  100mg  for insomnia, which was initially effective but has since stopped working. The patient also experiences hot flashes.  The patient has stopped taking fluoxetine  for anxiety remains on BuSpar . The patient also mentions pain on the right side when sleeping, which she attributes to the cirrhosis.  The patient has a history of high cholesterol and is on atorvastatin , but admits to not taking it regularly due to concerns about liver enzymes elevation in the setting of her cirrhosis.  Her last set of labs revealed normal transaminases but elevated alkaline phosphatase but at that time she also had cholelithiasis and has since had cholecystectomy.  The patient also has a history of high blood pressure, which is managed with amlodipine . The patient takes omeprazole  every other day for acid reflux.        Past Medical History:  Diagnosis Date   Anxiety    Arthritis    right foot   Cirrhosis (HCC)    Depression    GERD (gastroesophageal reflux disease)    History of alcoholism (HCC)    History of cellulitis    2015  right side  face   Hypertension    followed by pcp  (per pt never had stress test)   Malnutrition (HCC)    Metatarsal fracture    right 2nd   Ovarian cyst    Peripheral neuropathy    feet   Strachan's syndrome    painful neuropathy   Wears dentures    upper   Wears glasses     Past Surgical History:  Procedure Laterality Date   ABDOMINAL HYSTERECTOMY  2002   Partial hysterectomy,   BREAST BIOPSY Right 2023   CHOLECYSTECTOMY N/A 06/15/2023   Procedure: LAPAROSCOPIC CHOLECYSTECTOMY;  Surgeon: Dasie Leonor CROME, MD;  Location: [facility];  Service: General;  Laterality: N/A;   MULTIPLE EXTRACTIONS WITH ALVEOLOPLASTY N/A 12/25/2013   Procedure: Extraction of tooth #'s 2,3,4,5,6,8,9,10,11,12,13,14,15,20,21,22,23,24,25,26,27, 71,70,69,68 with alveoloplasty and bilateral mandibular tori reductions;  Surgeon: Tanda JULIANNA Fanny, DDS;  Location: [facility];  Service: Oral Surgery;  Laterality: N/A;   ORIF FOOT FRACTURE Right    multiple  metatarsal fxs   ORIF TOE FRACTURE Right 07/03/2019   Procedure: OPEN REDUCTION INTERNAL FIXATION (ORIF) METATARSAL (TOE) FRACTURE;  Surgeon: Gershon Donnice SAUNDERS, [name];  Location: [facility];  Service: Podiatry;  Laterality: Right;  LEG BLOCK   SURAL NERVE BX Right 12/24/2013   Procedure: SURAL NERVE BIOPSY;  Surgeon: Victory Gens, MD;  Location: [facility];  Service: Neurosurgery;  Laterality: Right;   TUBAL LIGATION Bilateral 2002   PPTL    Family History  Adopted: Yes  Problem Relation Age of Onset   Hypertension  Mother    Raynaud syndrome Mother    Diabetes Father    Drug abuse Maternal Grandmother    Cancer Maternal Grandmother        intestinal   Hypertension Maternal Grandmother    Lung cancer Maternal Grandfather    Hypertension Paternal Grandfather    Diabetes Maternal Aunt    Heart disease Maternal Aunt    Diabetes Maternal Uncle    Heart disease Maternal Uncle    Colon cancer Neg Hx    Colon polyps Neg Hx    Esophageal cancer Neg Hx     Rectal cancer Neg Hx    Stomach cancer Neg Hx    Breast cancer Neg Hx    Ovarian cancer Neg Hx    Endometrial cancer Neg Hx     Social History   Socioeconomic History   Marital status: Widowed    Spouse name: Not on file   Number of children: 1   Years of education: hs   Highest education level: Not on file  Occupational History   Occupation: food service  Tobacco Use   Smoking status: Every Day    Current packs/day: 0.50    Average packs/day: 0.5 packs/day for 20.0 years (10.0 ttl pk-yrs)    Types: Cigarettes    Passive exposure: Current   Smokeless tobacco: Never  Vaping Use   Vaping status: Never Used  Substance and Sexual Activity   Alcohol  use: Yes    Comment: occasional per patient on 05-31-2023   Drug use: Not Currently    Comment: 07-02-2019  -- per pt last used in age 11s   Sexual activity: Yes    Partners: Male    Birth control/protection: Surgical  Other Topics Concern   Not on file  Social History Narrative   patient is widowed.  Patient lives with daughter in a 2 story home.    Patient works in personnel officer.    Education high school.   Right handed.   Caffeine sometimes tea.   Lives in a one story home   Social Drivers of Health   Financial Resource Strain: Low Risk  (09/20/2023)   Overall Financial Resource Strain (CARDIA)    Difficulty of Paying Living Expenses: Not very hard  Food Insecurity: No Food Insecurity (09/20/2023)   Hunger Vital Sign    Worried About Running Out of Food in the Last Year: Never true    Ran Out of Food in the Last Year: Never true  Transportation Needs: No Transportation Needs (09/20/2023)   PRAPARE - Administrator, Civil Service (Medical): No    Lack of Transportation (Non-Medical): No  Physical Activity: Insufficiently Active (09/20/2023)   Exercise Vital Sign    Days of Exercise per Week: 1 day    Minutes of Exercise per Session: 10 min  Stress: No Stress Concern Present (09/20/2023)   [employer] of  Occupational Health - Occupational Stress Questionnaire    Feeling of Stress : Only a little  Social Connections: Socially Integrated (09/20/2023)   Social Connection and Isolation Panel [NHANES]    Frequency of Communication with Friends and Family: More than three times a week    Frequency of Social Gatherings with Friends and Family: Three times a week    Attends Religious Services: 1 to 4 times per year    Active Member of Clubs or Organizations: Yes    Attends Banker Meetings: 1 to 4 times per year    Marital Status: Living  with partner    Allergies  Allergen Reactions   Cymbalta  [Duloxetine  Hcl] Other (See Comments)    Avoid due to Cirrhosis Pt on 6 week taper   Macrobid  [Nitrofurantoin  Macrocrystal] Anaphylaxis and Other (See Comments)    Patient reports 2 syncope episodes after her mouth became numb    Outpatient Medications Prior to Visit  Medication Sig Dispense Refill   busPIRone  (BUSPAR ) 30 MG tablet TAKE 1 TABLET (30 MG TOTAL) BY MOUTH IN THE MORNING AND AT BEDTIME. 60 tablet 0   Cyanocobalamin  (B-12 PO) Take 1 tablet by mouth daily.     FOLIC ACID  PO Take 1 capsule by mouth daily.     Multiple Vitamins-Minerals (MULTIVITAMIN WITH MINERALS) tablet Take 1 tablet by mouth daily.     pregabalin  (LYRICA ) 150 MG capsule TAKE 1 CAPSULE BY MOUTH 3 TIMES DAILY. 90 capsule 5   Thiamine  HCl (B-1 PO) Take 1 capsule by mouth daily.     amLODipine  (NORVASC ) 5 MG tablet TAKE 1 TABLET (5 MG TOTAL) BY MOUTH DAILY. 30 tablet 0   atorvastatin  (LIPITOR) 10 MG tablet Take 1 tablet (10 mg total) by mouth daily. 90 tablet 1   buPROPion  (WELLBUTRIN  SR) 200 MG 12 hr tablet TAKE 1 TABLET BY MOUTH TWICE A DAY 180 tablet 1   omeprazole  (PRILOSEC) 20 MG capsule Take 1 capsule (20 mg total) by mouth daily. 90 capsule 1   traZODone  (DESYREL ) 100 MG tablet Take 0.5 tablets (50 mg total) by mouth at bedtime. 90 tablet 1   No facility-administered medications prior to visit.      ROS Review of Systems  Constitutional:  Negative for activity change and appetite change.  HENT:  Negative for sinus pressure and sore throat.   Respiratory:  Negative for chest tightness, shortness of breath and wheezing.   Cardiovascular:  Negative for chest pain and palpitations.  Gastrointestinal:  Negative for abdominal distention, abdominal pain and constipation.  Genitourinary: Negative.   Musculoskeletal: Negative.   Psychiatric/Behavioral:  Negative for behavioral problems and dysphoric mood.     Objective:  BP 134/76   Pulse 73   Ht 5' 4 (1.626 m)   Wt 125 lb 12.8 oz (57.1 kg)   LMP  (LMP Unknown)   SpO2 96%   BMI 21.59 kg/m      09/20/2023    2:49 PM 06/28/2023    7:53 AM 06/15/2023    9:50 AM  BP/Weight  Systolic BP 134 118 145  Diastolic BP 76 76 91  Wt. (Lbs) 125.8 129   BMI 21.59 kg/m2 22.14 kg/m2       Physical Exam Constitutional:      Appearance: She is well-developed.  Cardiovascular:     Rate and Rhythm: Normal rate.     Heart sounds: Normal heart sounds. No murmur heard. Pulmonary:     Effort: Pulmonary effort is normal.     Breath sounds: Normal breath sounds. No wheezing or rales.  Chest:     Chest wall: No tenderness.  Abdominal:     General: Bowel sounds are normal. There is no distension.     Palpations: Abdomen is soft. There is no mass.     Tenderness: There is no abdominal tenderness.  Musculoskeletal:        General: Normal range of motion.     Right lower leg: No edema.     Left lower leg: No edema.  Neurological:     Mental Status: She is alert and oriented to person,  place, and time.  Psychiatric:        Mood and Affect: Mood normal.        Latest Ref Rng & Units 05/31/2023    2:42 PM 03/20/2023    4:09 PM 01/09/2023   12:42 PM  CMP  Glucose 70 - 99 mg/dL 887  96  895   BUN 6 - 20 mg/dL 12  7  9    Creatinine 0.44 - 1.00 mg/dL 9.39  9.31  9.28   Sodium 135 - 145 mmol/L 136  139  141   Potassium 3.5 - 5.1 mmol/L  3.9  4.7  4.3   Chloride 98 - 111 mmol/L 98  99  100   CO2 22 - 32 mmol/L 26  27  32   Calcium  8.9 - 10.3 mg/dL 9.4  9.7  89.9   Total Protein 6.5 - 8.1 g/dL 7.8  7.5  7.7   Total Bilirubin 0.3 - 1.2 mg/dL 0.6  0.4  0.5   Alkaline Phos 38 - 126 U/L 168  201  184   AST 15 - 41 U/L 31  24  20    ALT 0 - 44 U/L 27  23  16      Lipid Panel     Component Value Date/Time   CHOL 233 (H) 11/17/2022 1503   TRIG 169 (H) 11/17/2022 1503   HDL 40 11/17/2022 1503   CHOLHDL 5.8 (H) 11/17/2022 1503   CHOLHDL 4.0 07/19/2016 1238   VLDL 26 07/19/2016 1238   LDLCALC 162 (H) 11/17/2022 1503    CBC    Component Value Date/Time   WBC 11.0 (H) 05/31/2023 1442   RBC 4.30 05/31/2023 1442   HGB 14.6 05/31/2023 1442   HGB 12.9 01/09/2023 1242   HGB 14.1 08/26/2021 0921   HCT 43.2 05/31/2023 1442   HCT 40.7 08/26/2021 0921   PLT 154 05/31/2023 1442   PLT 254 01/09/2023 1242   PLT 199 08/26/2021 0921   MCV 100.5 (H) 05/31/2023 1442   MCV 102 (H) 08/26/2021 0921   MCH 34.0 05/31/2023 1442   MCHC 33.8 05/31/2023 1442   RDW 13.4 05/31/2023 1442   RDW 11.8 08/26/2021 0921   LYMPHSABS 3.0 01/09/2023 1242   LYMPHSABS 2.7 08/26/2021 0921   MONOABS 0.7 01/09/2023 1242   EOSABS 0.2 01/09/2023 1242   EOSABS 0.2 08/26/2021 0921   BASOSABS 0.1 01/09/2023 1242   BASOSABS 0.1 08/26/2021 9078    Lab Results  Component Value Date   HGBA1C 5.3 08/31/2021    The 10-year ASCVD risk score (Arnett DK, et al., 2019) is: 9.9%   Values used to calculate the score:     Age: 89 years     Sex: Female     Is Non-Hispanic African American: No     Diabetic: No     Tobacco smoker: Yes     Systolic Blood Pressure: 134 mmHg     Is BP treated: Yes     HDL Cholesterol: 40 mg/dL     Total Cholesterol: 233 mg/dL  Assessment & Plan:      Insomnia   Recent onset, not improved with current Trazodone  100mg . Possible association with menopausal hot flashes.   -Increase Trazodone  to 150mg  at bedtime.   -Consider  Paxil for management of hot flashes if she becomes bothersome.    Hyperlipidemia   Patient intermittently taking Atorvastatin  due to concerns about liver enzymes. Last cholesterol check in March showed elevated levels.   -10-year ASCVD risk score  of 9.9% -Encourage regular Atorvastatin  use.   -Check liver enzymes today.   -Repeat liver enzymes and cholesterol in one month to assess impact of regular Atorvastatin  use.    Hypertension   Well controlled on Amlodipine .   -Continue Amlodipine .    Liver cirrhosis Stable I explained her I do not expect liver cirrhosis to cause right upper quadrant pain.  Pain might have been due to her cholelithiasis and cholecystectomy   Strachan's syndrome -Currently on Lyrica   General Health Maintenance   -Administer influenza vaccine today.   -Check liver enzymes today due to previous elevation in alkaline phosphatase, potentially related to gallbladder issues.   -Follow-up in six months, with interim contact regarding lab results.          Meds ordered this encounter  Medications   traZODone  (DESYREL ) 150 MG tablet    Sig: Take 1 tablet (150 mg total) by mouth at bedtime.    Dispense:  90 tablet    Refill:  1    Dose increase   amLODipine  (NORVASC ) 5 MG tablet    Sig: Take 1 tablet (5 mg total) by mouth daily.    Dispense:  90 tablet    Refill:  1   atorvastatin  (LIPITOR) 10 MG tablet    Sig: Take 1 tablet (10 mg total) by mouth daily.    Dispense:  90 tablet    Refill:  1   buPROPion  (WELLBUTRIN  SR) 200 MG 12 hr tablet    Sig: Take 1 tablet (200 mg total) by mouth 2 (two) times daily.    Dispense:  180 tablet    Refill:  1   omeprazole  (PRILOSEC) 20 MG capsule    Sig: Take 1 capsule (20 mg total) by mouth daily.    Dispense:  90 capsule    Refill:  1    Follow-up: Return in about 6 months (around 03/19/2024) for Chronic medical conditions.       Corrina Sabin, MD, FAAFP. [facility]  [place], [place] [phone]   09/20/2023, 3:13 PM

## 2023-09-21 ENCOUNTER — Other Ambulatory Visit: Payer: Self-pay | Admitting: Family Medicine

## 2023-09-21 LAB — CMP14+EGFR
ALT: 32 [IU]/L (ref 0–32)
AST: 37 [IU]/L (ref 0–40)
Albumin: 4.8 g/dL (ref 3.8–4.9)
Alkaline Phosphatase: 236 [IU]/L — ABNORMAL HIGH (ref 44–121)
BUN/Creatinine Ratio: 14 (ref 9–23)
BUN: 9 mg/dL (ref 6–24)
Bilirubin Total: 0.5 mg/dL (ref 0.0–1.2)
CO2: 23 mmol/L (ref 20–29)
Calcium: 9.6 mg/dL (ref 8.7–10.2)
Chloride: 96 mmol/L (ref 96–106)
Creatinine, Ser: 0.66 mg/dL (ref 0.57–1.00)
Globulin, Total: 3.2 g/dL (ref 1.5–4.5)
Glucose: 87 mg/dL (ref 70–99)
Potassium: 3.9 mmol/L (ref 3.5–5.2)
Sodium: 142 mmol/L (ref 134–144)
Total Protein: 8 g/dL (ref 6.0–8.5)
eGFR: 105 mL/min/{1.73_m2} (ref >59)

## 2023-09-21 MED ORDER — EZETIMIBE 10 MG PO TABS: 10.0000 mg | ORAL_TABLET | Freq: Every day | ORAL | 1 refills | Status: DC

## 2023-09-25 ENCOUNTER — Encounter: Payer: Self-pay | Admitting: Gastroenterology

## 2023-10-05 ENCOUNTER — Encounter: Payer: Self-pay | Admitting: Gastroenterology

## 2023-10-05 ENCOUNTER — Ambulatory Visit: Payer: MEDICAID | Admitting: Gastroenterology

## 2023-10-05 ENCOUNTER — Other Ambulatory Visit (INDEPENDENT_AMBULATORY_CARE_PROVIDER_SITE_OTHER): Payer: MEDICAID

## 2023-10-05 VITALS — BP 130/74 | HR 75 | Ht 64.0 in | Wt 128.6 lb

## 2023-10-05 DIAGNOSIS — K7030 Alcoholic cirrhosis of liver without ascites: Secondary | ICD-10-CM | POA: Diagnosis not present

## 2023-10-05 DIAGNOSIS — F1090 Alcohol use, unspecified, uncomplicated: Secondary | ICD-10-CM | POA: Diagnosis not present

## 2023-10-05 DIAGNOSIS — R7989 Other specified abnormal findings of blood chemistry: Secondary | ICD-10-CM

## 2023-10-05 DIAGNOSIS — R1010 Upper abdominal pain, unspecified: Secondary | ICD-10-CM

## 2023-10-05 DIAGNOSIS — Z23 Encounter for immunization: Secondary | ICD-10-CM

## 2023-10-05 DIAGNOSIS — E782 Mixed hyperlipidemia: Secondary | ICD-10-CM

## 2023-10-05 DIAGNOSIS — I10 Essential (primary) hypertension: Secondary | ICD-10-CM

## 2023-10-05 DIAGNOSIS — F1010 Alcohol abuse, uncomplicated: Secondary | ICD-10-CM

## 2023-10-05 LAB — PROTIME-INR
INR: 1.1 {ratio} — ABNORMAL HIGH (ref 0.8–1.0)
Prothrombin Time: 11.4 s (ref 9.6–13.1)

## 2023-10-05 LAB — HEPATIC FUNCTION PANEL
ALT: 37 U/L — ABNORMAL HIGH (ref 0–35)
AST: 68 U/L — ABNORMAL HIGH (ref 0–37)
Albumin: 4.5 g/dL (ref 3.5–5.2)
Alkaline Phosphatase: 190 U/L — ABNORMAL HIGH (ref 39–117)
Bilirubin, Direct: 0.1 mg/dL (ref 0.0–0.3)
Total Bilirubin: 0.4 mg/dL (ref 0.2–1.2)
Total Protein: 8.1 g/dL (ref 6.0–8.3)

## 2023-10-05 NOTE — Patient Instructions (Signed)
You have been scheduled for an abdominal ultrasound at [facility] (1st floor of hospital) on 10/11/2023 at 10:30am. Please arrive 30 minutes prior to your appointment for registration. Make certain not to have anything to eat or drink 6 hours prior to your appointment. Should you need to reschedule your appointment, please contact radiology at [phone]. This test typically takes about 30 minutes to perform.  Your provider has requested that you go to the basement level for lab work before leaving today. Press "B" on the elevator. The lab is located at the first door on the left as you exit the elevator.  _______________________________________________________  If your blood pressure at your visit was 140/90 or greater, please contact your primary care physician to follow up on this.  _______________________________________________________  If you are age 56 or older, your body mass index should be between 23-30. Your Body mass index is 22.07 kg/m. If this is out of the aforementioned range listed, please consider follow up with your Primary Care Provider.  If you are age 24 or younger, your body mass index should be between 19-25. Your Body mass index is 22.07 kg/m. If this is out of the aformentioned range listed, please consider follow up with your Primary Care Provider.   ________________________________________________________  The [facility] providers would like to encourage you to use [facility] to communicate with providers for non-urgent requests or questions.  Due to long hold times on the telephone, sending your provider a message by [facility] may be a faster and more efficient way to get a response.  Please allow 48 business hours for a response.  Please remember that this is for non-urgent requests.  _______________________________________________________ It was a pleasure to see you today!  Thank you for trusting me with your gastrointestinal care!

## 2023-10-05 NOTE — Progress Notes (Signed)
[facility] Progress Note  Chief Complaint: Cirrhosis, elevated LFTs (alkaline phosphatase)  Subjective  Prior history  Chronic stable compensated alcohol-related cirrhosis, last OV 01/11/2023 (at which time she was about 90 days sober) Was having intermittent right upper quadrant pain, imaging showed gallstones, had laparoscopic cholecystectomy (without IOC) with Dr. Sophronia Simas October 2024   Discussed the use of AI scribe software for clinical note transcription with the patient, who gave verbal consent to proceed.  History of Present Illness   The patient, with a history of cirrhosis and gallbladder stones, underwent a cholecystectomy in early October. Postoperatively, they experienced some pain, but it was not as severe as the preoperative pain. Recently, during a routine checkup, elevated liver enzymes were noted. The patient was aware of the potential for fluctuating liver enzymes due to their cirrhosis. No change in abdominal girth, no peripheral edema The patient has a history of alcohol consumption, which has been significantly reduced. They admitted to occasional alcohol intake, specifically on their birthday, Christmas, and New Year's, amounting to a couple of drinks on these occasions.    ROS: Cardiovascular:  no chest pain Respiratory: no dyspnea  The patient's Past Medical, Family and Social History were reviewed and are on file in the EMR. Past Medical History:  Diagnosis Date   Anxiety    Arthritis    right foot   Cirrhosis (HCC)    Depression    GERD (gastroesophageal reflux disease)    History of alcoholism (HCC)    History of cellulitis    2015  right side face   Hypertension    followed by pcp  (per pt never had stress test)   Malnutrition (HCC)    Metatarsal fracture    right 2nd   Ovarian cyst    Peripheral neuropathy    feet   Strachan's syndrome    painful neuropathy   Wears dentures    upper   Wears glasses     Past Surgical History:   Procedure Laterality Date   ABDOMINAL HYSTERECTOMY  2024   Partial hysterectomy,   BREAST BIOPSY Right 2023   CHOLECYSTECTOMY N/A 06/15/2023   Procedure: LAPAROSCOPIC CHOLECYSTECTOMY;  Surgeon: Fritzi Mandes, MD;  Location: [facility];  Service: General;  Laterality: N/A;   MULTIPLE EXTRACTIONS WITH ALVEOLOPLASTY N/A 12/25/2013   Procedure: Extraction of tooth #'s 2,3,4,5,6,8,9,10,11,12,13,14,15,20,21,22,23,24,25,26,27, 16,10,96,04 with alveoloplasty and bilateral mandibular tori reductions;  Surgeon: Charlynne Pander, DDS;  Location: [facility] OR;  Service: Oral Surgery;  Laterality: N/A;   ORIF FOOT FRACTURE Right    multiple  metatarsal fxs   ORIF TOE FRACTURE Right 07/03/2019   Procedure: OPEN REDUCTION INTERNAL FIXATION (ORIF) METATARSAL (TOE) FRACTURE;  Surgeon: Vivi Barrack, [name];  Location: [facility];  Service: Podiatry;  Laterality: Right;  LEG BLOCK   SURAL NERVE BX Right 12/24/2013   Procedure: SURAL NERVE BIOPSY;  Surgeon: Barnett Abu, MD;  Location: [facility];  Service: Neurosurgery;  Laterality: Right;   TUBAL LIGATION Bilateral 2002   PPTL     Objective:  Med list reviewed  Current Outpatient Medications:    amLODipine (NORVASC) 5 MG tablet, Take 1 tablet (5 mg total) by mouth daily., Disp: 90 tablet, Rfl: 1   buPROPion (WELLBUTRIN SR) 200 MG 12 hr tablet, Take 1 tablet (200 mg total) by mouth 2 (two) times daily., Disp: 180 tablet, Rfl: 1   busPIRone (BUSPAR) 30 MG tablet, TAKE 1 TABLET (30 MG TOTAL) BY MOUTH IN THE  MORNING AND AT BEDTIME., Disp: 60 tablet, Rfl: 0   Cyanocobalamin (B-12 PO), Take 1 tablet by mouth daily., Disp: , Rfl:    ezetimibe (ZETIA) 10 MG tablet, Take 1 tablet (10 mg total) by mouth daily., Disp: 90 tablet, Rfl: 1   FOLIC ACID PO, Take 1 capsule by mouth daily., Disp: , Rfl:    Multiple Vitamins-Minerals (MULTIVITAMIN WITH MINERALS) tablet, Take 1 tablet by mouth daily., Disp: , Rfl:    omeprazole (PRILOSEC) 20 MG capsule, Take  1 capsule (20 mg total) by mouth daily., Disp: 90 capsule, Rfl: 1   pregabalin (LYRICA) 150 MG capsule, TAKE 1 CAPSULE BY MOUTH 3 TIMES DAILY., Disp: 90 capsule, Rfl: 5   Thiamine HCl (B-1 PO), Take 1 capsule by mouth daily., Disp: , Rfl:    traZODone (DESYREL) 150 MG tablet, Take 1 tablet (150 mg total) by mouth at bedtime. (Patient taking differently: Take 150 mg by mouth at bedtime as needed.), Disp: 90 tablet, Rfl: 1   Vital signs in last 24 hrs: Vitals:   10/05/23 1357  BP: 130/74  Pulse: 75  SpO2: 98%   Wt Readings from Last 3 Encounters:  10/05/23 128 lb 9.6 oz (58.3 kg)  09/20/23 125 lb 12.8 oz (57.1 kg)  06/28/23 129 lb (58.5 kg)    Physical Exam  Well-appearing, thin as before HEENT: sclera anicteric, oral mucosa moist without lesions. Neck: supple, no thyromegaly, JVD or lymphadenopathy Cardiac: Regular without appreciable murmur,  no peripheral edema Pulm: clear to auscultation bilaterally, normal RR and effort noted Abdomen: soft, no tenderness, with active bowel sounds.  Left lobe liver palpable on inspiration.  No bulging flanks Skin; warm and dry, no jaundice or rash   Labs:     Latest Ref Rng & Units 05/31/2023    2:42 PM 01/09/2023   12:42 PM 08/31/2022   11:01 AM  CBC  WBC 4.0 - 10.5 K/uL 11.0  12.1  12.2   Hemoglobin 12.0 - 15.0 g/dL 95.6  21.3  08.6   Hematocrit 36.0 - 46.0 % 43.2  38.6  41.5   Platelets 150 - 400 K/uL 154  254  191.0       Latest Ref Rng & Units 09/20/2023    3:21 PM 05/31/2023    2:42 PM 03/20/2023    4:09 PM  CMP  Glucose 70 - 99 mg/dL 87  578  96   BUN 6 - 24 mg/dL 9  12  7    Creatinine 0.57 - 1.00 mg/dL 4.69  6.29  5.28   Sodium 134 - 144 mmol/L 142  136  139   Potassium 3.5 - 5.2 mmol/L 3.9  3.9  4.7   Chloride 96 - 106 mmol/L 96  98  99   CO2 20 - 29 mmol/L 23  26  27    Calcium 8.7 - 10.2 mg/dL 9.6  9.4  9.7   Total Protein 6.0 - 8.5 g/dL 8.0  7.8  7.5   Total Bilirubin 0.0 - 1.2 mg/dL 0.5  0.6  0.4   Alkaline Phos 44 -  121 IU/L 236  168  201   AST 0 - 40 IU/L 37  31  24   ALT 0 - 32 IU/L 32  27  23    Negative autoimmune liver disease labs April 2023  AFP normal at 3.3 in July 2024 Last INR 1.4 in April 2024 ___________________________________________ Radiologic studies: CLINICAL DATA:  Elevated liver function tests.   EXAM: ULTRASOUND ABDOMEN LIMITED  RIGHT UPPER QUADRANT   COMPARISON:  October 28, 2021   FINDINGS: Gallbladder:   Gallstones are noted, largest measures 1.4 cm. No wall thickening visualized. No sonographic Murphy sign noted by sonographer.   Common bile duct:   Diameter: 3.2 mm   Liver:   No focal lesion identified. Coarsened echotexture with nodular contour. Portal vein is patent on color Doppler imaging with normal direction of blood flow towards the liver.   Other: None.   IMPRESSION: 1. Cholelithiasis without evidence of acute cholecystitis. 2. Coarsened echotexture with nodular contour of the liver, consistent with cirrhosis.     Electronically Signed   By: Sherian Rein M.D.   On: 03/29/2023 09:38    ____________________________________________ Other:   _____________________________________________   Encounter Diagnoses  Name Primary?   Alcoholic cirrhosis of liver without ascites (HCC) Yes   LFT elevation     Assessment and Plan    Cirrhosis Elevated liver enzymes, possibly related to occasional alcohol consumption lately. Alkaline phosphatase has historically been higher during periods of heavier drinking.  Reassured her the LFTs will fluctuate some with cirrhosis. Doubt retained CBD stone.  (Small caliber CBD on preoperative imaging, pattern of LFTs, marked reduction in pain) -Continue monitoring liver function tests. -Advised patient to remain abstinent from alcohol.  Will recheck hepatic function panel today as well as PT/INR to calculate MELD score and AFP for hepatoma screening  Full abdominal ultrasound for hepatoma screening and to  assess for development of any ascites.   See me in 6 months or sooner if needed   Charlie Pitter III

## 2023-10-06 ENCOUNTER — Encounter: Payer: Self-pay | Admitting: Family Medicine

## 2023-10-06 LAB — CMP14+EGFR
ALT: 41 [IU]/L — ABNORMAL HIGH (ref 0–32)
AST: 76 [IU]/L — ABNORMAL HIGH (ref 0–40)
Albumin: 4.4 g/dL (ref 3.8–4.9)
Alkaline Phosphatase: 239 [IU]/L — ABNORMAL HIGH (ref 44–121)
BUN/Creatinine Ratio: 10 (ref 9–23)
BUN: 7 mg/dL (ref 6–24)
Bilirubin Total: 0.3 mg/dL (ref 0.0–1.2)
CO2: 22 mmol/L (ref 20–29)
Calcium: 9.3 mg/dL (ref 8.7–10.2)
Chloride: 97 mmol/L (ref 96–106)
Creatinine, Ser: 0.68 mg/dL (ref 0.57–1.00)
Globulin, Total: 3 g/dL (ref 1.5–4.5)
Glucose: 86 mg/dL (ref 70–99)
Potassium: 4.2 mmol/L (ref 3.5–5.2)
Sodium: 139 mmol/L (ref 134–144)
Total Protein: 7.4 g/dL (ref 6.0–8.5)
eGFR: 105 mL/min/{1.73_m2} (ref >59)

## 2023-10-06 LAB — LP+NON-HDL CHOLESTEROL
Cholesterol, Total: 228 mg/dL — ABNORMAL HIGH (ref 100–199)
HDL: 71 mg/dL (ref >39)
LDL Chol Calc (NIH): 120 mg/dL — ABNORMAL HIGH (ref 0–99)
Total Non-HDL-Chol (LDL+VLDL): 157 mg/dL — ABNORMAL HIGH (ref 0–129)
Triglycerides: 217 mg/dL — ABNORMAL HIGH (ref 0–149)
VLDL Cholesterol Cal: 37 mg/dL (ref 5–40)

## 2023-10-09 ENCOUNTER — Other Ambulatory Visit: Payer: Self-pay | Admitting: Family Medicine

## 2023-10-09 DIAGNOSIS — F32A Depression, unspecified: Secondary | ICD-10-CM

## 2023-10-09 LAB — AFP TUMOR MARKER: AFP-Tumor Marker: 4.1 ng/mL

## 2023-10-10 NOTE — Telephone Encounter (Signed)
Requested medication (s) are due for refill today: yes   Requested medication (s) are on the active medication list: yes   Last refill:  08/24/23 #60 0 refills  Future visit scheduled: yes in 5 months  Notes to clinic:  no refills remain. Do you want to refill for 5 months?     Requested Prescriptions  Pending Prescriptions Disp Refills   busPIRone (BUSPAR) 30 MG tablet [Pharmacy Med Name: BUSPIRONE HCL 30 MG TABLET] 60 tablet 0    Sig: TAKE 1 TABLET (30 MG TOTAL) BY MOUTH IN THE MORNING AND AT BEDTIME.     Psychiatry: Anxiolytics/Hypnotics - Non-controlled Passed - 10/10/2023  9:59 AM      Passed - Valid encounter within last 12 months    Recent Outpatient Visits           2 weeks ago Vasomotor symptoms due to menopause   [facility] Of [place]. [facility] Hoy Register, MD   6 months ago Anxiety and depression   [facility] Of [place]. [facility] Hoy Register, MD   10 months ago Elevated LFTs   [facility] [place] [facility] Of [place]. [facility] [place], Marzella Schlein, [place]   1 year ago Hypokalemia   [facility] Of [place]. [facility] Hoy Register, MD   2 years ago Left upper quadrant abdominal mass    [facility] [place]. [facility] Hoy Register, MD       Future Appointments             In 5 months Hoy Register, MD [facility] [place] - A Dept Of . [facility]

## 2023-10-11 ENCOUNTER — Ambulatory Visit (HOSPITAL_COMMUNITY): Admission: RE | Admit: 2023-10-11 | Payer: MEDICAID | Source: Ambulatory Visit

## 2023-10-19 ENCOUNTER — Ambulatory Visit (HOSPITAL_COMMUNITY)
Admission: RE | Admit: 2023-10-19 | Discharge: 2023-10-19 | Disposition: A | Discharge status: Home / Self Care | Payer: MEDICAID | Source: Ambulatory Visit | Attending: Gastroenterology | Admitting: Gastroenterology

## 2023-10-19 DIAGNOSIS — R7989 Other specified abnormal findings of blood chemistry: Secondary | ICD-10-CM | POA: Diagnosis present

## 2023-10-19 DIAGNOSIS — K7030 Alcoholic cirrhosis of liver without ascites: Secondary | ICD-10-CM | POA: Insufficient documentation

## 2023-10-23 ENCOUNTER — Encounter: Payer: Self-pay | Admitting: Family Medicine

## 2023-10-23 ENCOUNTER — Ambulatory Visit: Payer: MEDICAID

## 2023-10-24 NOTE — Telephone Encounter (Signed)
56-month office recall in epic.

## 2023-10-24 NOTE — Telephone Encounter (Signed)
6 month clinic visit with me for labs and RUQ U/S  - HD

## 2023-12-06 ENCOUNTER — Other Ambulatory Visit: Payer: Self-pay | Admitting: Family Medicine

## 2023-12-06 DIAGNOSIS — F419 Anxiety disorder, unspecified: Secondary | ICD-10-CM

## 2023-12-25 ENCOUNTER — Encounter: Payer: Self-pay | Admitting: Podiatry

## 2023-12-25 ENCOUNTER — Ambulatory Visit (INDEPENDENT_AMBULATORY_CARE_PROVIDER_SITE_OTHER): Payer: MEDICAID | Admitting: Podiatry

## 2023-12-25 DIAGNOSIS — L600 Ingrowing nail: Secondary | ICD-10-CM

## 2023-12-25 DIAGNOSIS — B351 Tinea unguium: Secondary | ICD-10-CM | POA: Diagnosis not present

## 2023-12-25 MED ORDER — CEPHALEXIN 500 MG PO CAPS: 500.0000 mg | ORAL_CAPSULE | Freq: Three times a day (TID) | ORAL | 0 refills | Status: AC

## 2023-12-25 NOTE — Progress Notes (Unsigned)
 Subjective: Chief Complaint  Patient presents with   Ingrown Toenail    RM#11 Ingrown toe nail right foot big toe causing discomfort.   53 year old female presents the office today with concerns of pain on the right big toe, medial aspect which is causing discomfort.  She has some swelling to the corner without any drainage or pus.    She is also noticed the nails been thickened discolored.  No recent treatment for either issue. Objective: AAO x3, NAD DP/PT pulses palpable bilaterally, CRT less than 3 seconds Hallux toenails that is hypertrophic,  dystrophic with yellow discoloration.  There is incurvation present on the medial nail border localized edema erythema more from inflammation.  There is no ascending cellulitis there is no drainage or pus. No pain with calf compression, swelling, warmth, erythema  Assessment: Right medial hallux ingrown toenail, onychomycosis  Plan: -All treatment options discussed with the patient including all alternatives, risks, complications.  -At this time, the patient is requesting partial nail removal with chemical matricectomy to the symptomatic portion of the nail. Risks and complications were discussed with the patient for which they understand and written consent was obtained. Under sterile conditions a total of 3 mL of a mixture of 2% lidocaine plain and 0.5% Marcaine plain was infiltrated in a hallux block fashion. Once anesthetized, the skin was prepped in sterile fashion. A tourniquet was then applied. Next the medial aspect of hallux nail border was then sharply excised making sure to remove the entire offending nail border. Once the nails were ensured to be removed area was debrided and the underlying skin was intact. There is no purulence identified in the procedure. Next phenol was then applied under standard conditions and copiously irrigated.  Silvadene was applied. A dry sterile dressing was applied. After application of the dressing the  tourniquet was removed and there is found to be an immediate capillary refill time to the digit. The patient tolerated the procedure well any complications. Post procedure instructions were discussed the patient for which he verbally understood. Discussed signs/symptoms of infection and directed to call the office immediately should any occur or go directly to the emergency room. In the meantime, encouraged to call the office with any questions, concerns, changes symptoms. -Keflex -Likely start antifungal medication once this is healed. -Patient encouraged to call the office with any questions, concerns, change in symptoms.

## 2023-12-25 NOTE — Patient Instructions (Signed)

## 2023-12-31 ENCOUNTER — Other Ambulatory Visit: Payer: Self-pay | Admitting: Neurology

## 2024-01-08 ENCOUNTER — Ambulatory Visit: Payer: MEDICAID | Admitting: Podiatry

## 2024-02-02 ENCOUNTER — Other Ambulatory Visit: Payer: Self-pay | Admitting: Family Medicine

## 2024-02-02 DIAGNOSIS — I10 Essential (primary) hypertension: Secondary | ICD-10-CM

## 2024-03-05 ENCOUNTER — Other Ambulatory Visit: Payer: Self-pay | Admitting: Neurology

## 2024-03-06 NOTE — Telephone Encounter (Signed)
 Pt is needing a refill on the lyrica  and she will be out of medication today  and she wants to see if we can get it called in tomorrow  morning

## 2024-03-06 NOTE — Telephone Encounter (Signed)
 Pt. LMOM Checking on refill that pharmacy contacted us  for, did not leave Rx name

## 2024-03-07 NOTE — Telephone Encounter (Signed)
 Called pt and let her know RX was sent.

## 2024-03-18 ENCOUNTER — Telehealth: Payer: Self-pay | Admitting: Family Medicine

## 2024-03-18 NOTE — Telephone Encounter (Signed)
 Contacted pt no vm to leave a message to confirmed appt called both numbers

## 2024-03-19 ENCOUNTER — Ambulatory Visit: Payer: MEDICAID | Admitting: Family Medicine

## 2024-04-01 ENCOUNTER — Other Ambulatory Visit: Payer: MEDICAID

## 2024-04-01 ENCOUNTER — Ambulatory Visit: Payer: MEDICAID | Admitting: Neurology

## 2024-04-01 ENCOUNTER — Encounter: Payer: Self-pay | Admitting: Neurology

## 2024-04-01 VITALS — BP 138/82 | HR 68 | Ht 64.0 in | Wt 128.0 lb

## 2024-04-01 DIAGNOSIS — G6289 Other specified polyneuropathies: Secondary | ICD-10-CM | POA: Diagnosis not present

## 2024-04-01 DIAGNOSIS — G621 Alcoholic polyneuropathy: Secondary | ICD-10-CM | POA: Diagnosis not present

## 2024-04-01 NOTE — Progress Notes (Signed)
 Follow-up Visit   Date: 04/01/24    ESME FREUND MRN: [id number] DOB: March 28, 1971   Interim History: [name] is a 53 y.o. right-handed Caucasian female with Derenda syndrome, alcoholic cirrhosis, tobacco use, hypertension, and GERD returning to the clinic for follow-up of nutritional ataxic neuropathy.  The patient was accompanied to the clinic by self.  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  Sober since 10/2022 and doing well.  - Continue Lyrica  150mg  TID  2.  Nutrient deficiency  - Continue vitamin B12 1000mcg, vitamin B1 100mg , and folic 1mg  daily  - Check vitamin B12, folate, and vitamin B1 level  Return to clinic in 1 year  --------------------------------------------- UPDATE 04/01/2024:  She is here for follow-up visit.  She has been doing well.  Pain is mostly controlled on Lyrica  150mg  TID.  She is working in the kitchen at the [employer] and reports that at the end of the day, her lower legs can burn, get red, or swollen.  Her balance continues to be impaired, fortunately no recent falls.     Medications:  Current Outpatient Medications on File Prior to Visit  Medication Sig Dispense Refill   amLODipine  (NORVASC ) 5 MG tablet TAKE 1 TABLET (5 MG TOTAL) BY MOUTH DAILY. 90 tablet 1   buPROPion  (WELLBUTRIN  SR) 200 MG 12 hr tablet Take 1 tablet (200 mg total) by mouth 2 (two) times daily. 180 tablet 1   busPIRone  (BUSPAR ) 30 MG tablet TAKE 1 TABLET (30 MG TOTAL) BY MOUTH IN THE MORNING AND AT BEDTIME. 180 tablet 1   Cyanocobalamin  (B-12 PO) Take 1 tablet by mouth daily.     ezetimibe  (ZETIA ) 10 MG tablet TAKE 1 TABLET BY MOUTH EVERY DAY 90 tablet 1   FOLIC ACID  PO Take 1 capsule by mouth daily.     Multiple Vitamins-Minerals (MULTIVITAMIN WITH MINERALS) tablet Take 1 tablet by mouth daily.     omeprazole  (PRILOSEC) 20 MG capsule Take 1 capsule (20 mg total) by mouth daily. 90 capsule 1    pregabalin  (LYRICA ) 150 MG capsule TAKE 1 CAPSULE BY MOUTH THREE TIMES DAILY 90 capsule 5   Thiamine  HCl (B-1 PO) Take 1 capsule by mouth daily.     traZODone  (DESYREL ) 150 MG tablet Take 1 tablet (150 mg total) by mouth at bedtime. 90 tablet 1   cephALEXin  (KEFLEX ) 500 MG capsule Take 1 capsule (500 mg total) by mouth 3 (three) times daily. (Patient not taking: Reported on 04/01/2024) 21 capsule 0   No current facility-administered medications on file prior to visit.    Allergies:  Allergies  Allergen Reactions   Cymbalta  [Duloxetine  Hcl] Other (See Comments)    Avoid due to Cirrhosis Pt on 6 week taper   Macrobid  [Nitrofurantoin  Macrocrystal] Anaphylaxis and Other (See Comments)    Patient reports 2 syncope episodes after her mouth became numb     Vital Signs:  BP 138/82   Pulse 68   Ht 5' 4 (1.626 m)   Wt 128 lb (58.1 kg)   LMP  (LMP Unknown)   SpO2 96%   BMI 21.97 kg/m    Neurological Exam: MENTAL STATUS including orientation to time, place, person, recent and remote memory, attention span and concentration, language, and fund of knowledge is normal.  Speech is not dysarthric.  MOTOR:  Motor strength is 5/5 in all extremities. No atrophy, fasciculations or abnormal movements.  No pronator drift.  Tone is normal.  MSRs:  Right                                                                 Left brachioradialis 2+  brachioradialis 2+  biceps 2+  biceps 2+  triceps 2+  triceps 2+  patellar 1+  Patellar 1+  ankle jerk 0  ankle jerk 0   SENSORY:  Intact to vibration is intact at the hands, absent below the knees.    COORDINATION/GAIT:   Gait appears mildly unsteady, unassisted.    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 EMG evaluation of the right lower extremity was relatively unremarkable. Labs 12/2013:  ANA negative. RPR nonreactive, HIV nonreactive, ESR  only at 5, rheumatoid factor negative, copper  127, Vitamin B12 at 381. Heavy metal screen is neg, SPEP no M protein CSF 12/24/2013:  CSF R1 W0 G60 P34  Labs 11/16/2016:  Vitamin B12 119*, vitamin B1 < 7, MMA 216, folate 2.2* Labs 09/11/2017:  Vitamin B12 1042, folate 23.6, vitamin B1 7*    Thank you for allowing me to participate in patient's care.  If I can answer any additional questions, I would be pleased to do so.    Sincerely,    Bethani Brugger K. Tobie, DO

## 2024-04-01 NOTE — Patient Instructions (Signed)
 It was great to see you today.  We will check labs and let you know if you need to stay on your supplements.

## 2024-04-04 ENCOUNTER — Telehealth: Payer: Self-pay | Admitting: Neurology

## 2024-04-04 NOTE — Telephone Encounter (Signed)
 Pt calling for results

## 2024-04-04 NOTE — Telephone Encounter (Signed)
 Please let her know that vitamin B12 and folate is normal.  Vitamin B1 still in process.  She can stop B12 and folate supplements and continue multivitamin.  We will let her know about vitamin B1 (thiamine ), when results are back.

## 2024-04-04 NOTE — Telephone Encounter (Signed)
 Called patient and unable to leave a message as mailbox not set up

## 2024-04-05 ENCOUNTER — Ambulatory Visit: Payer: Self-pay | Admitting: Neurology

## 2024-04-05 LAB — VITAMIN B1: Vitamin B1 (Thiamine): 17 nmol/L (ref 8–30)

## 2024-04-05 LAB — B12 AND FOLATE PANEL
Folate: >24 ng/mL
Vitamin B-12: >2000 pg/mL — ABNORMAL HIGH (ref 200–1100)

## 2024-04-05 NOTE — Telephone Encounter (Signed)
 Called patient and unable to leave a message as mailbox is not set up.

## 2024-04-05 NOTE — Telephone Encounter (Signed)
 Pt. Called bk

## 2024-04-05 NOTE — Telephone Encounter (Signed)
 See result  note  Called patient again and left a message for a call back.

## 2024-04-05 NOTE — Telephone Encounter (Signed)
 Called patient and left a message for a call back.

## 2024-04-06 ENCOUNTER — Other Ambulatory Visit: Payer: Self-pay | Admitting: Family Medicine

## 2024-04-06 DIAGNOSIS — K219 Gastro-esophageal reflux disease without esophagitis: Secondary | ICD-10-CM

## 2024-04-06 DIAGNOSIS — F32A Depression, unspecified: Secondary | ICD-10-CM

## 2024-04-23 ENCOUNTER — Encounter: Payer: Self-pay | Admitting: Family Medicine

## 2024-04-23 ENCOUNTER — Ambulatory Visit: Payer: MEDICAID | Attending: Family Medicine | Admitting: Family Medicine

## 2024-04-23 VITALS — BP 157/90 | HR 65 | Ht 64.0 in | Wt 127.0 lb

## 2024-04-23 DIAGNOSIS — K7030 Alcoholic cirrhosis of liver without ascites: Secondary | ICD-10-CM | POA: Diagnosis not present

## 2024-04-23 DIAGNOSIS — F32A Depression, unspecified: Secondary | ICD-10-CM

## 2024-04-23 DIAGNOSIS — E782 Mixed hyperlipidemia: Secondary | ICD-10-CM

## 2024-04-23 DIAGNOSIS — M792 Neuralgia and neuritis, unspecified: Secondary | ICD-10-CM

## 2024-04-23 DIAGNOSIS — L309 Dermatitis, unspecified: Secondary | ICD-10-CM

## 2024-04-23 DIAGNOSIS — F419 Anxiety disorder, unspecified: Secondary | ICD-10-CM | POA: Diagnosis not present

## 2024-04-23 DIAGNOSIS — I10 Essential (primary) hypertension: Secondary | ICD-10-CM

## 2024-04-23 DIAGNOSIS — L308 Other specified dermatitis: Secondary | ICD-10-CM

## 2024-04-23 DIAGNOSIS — H53009 Unspecified amblyopia, unspecified eye: Secondary | ICD-10-CM

## 2024-04-23 NOTE — Progress Notes (Signed)
 Subjective:  Patient ID: [name], female    DOB: [birth year]  Age: 53 y.o. MRN: [id number]  CC: Medical Management of Chronic Issues (Circulation in legs/Discuss referral to liver doctor)     Discussed the use of AI scribe software for clinical note transcription with the patient, who gave verbal consent to proceed.  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  She has cirrhosis and seeks a referral to a liver specialist for a second opinion due to dissatisfaction with her current gastroenterologist. She has stopped alcohol  consumption, and her B1 levels are normal.  She experiences redness and pale feet, and ankle swelling after prolonged standing wondering if she has circulation problems. There is no calf pain when walking. She has neuropathy and takes Lyrica  for her leg symptoms.  Her medical history includes elevated cholesterol, previously managed with atorvastatin , now on ezetimibe  due to liver enzyme concerns. Her cholesterol was slightly elevated in January. She takes a multivitamin and has stopped B12 supplements after high levels were noted.  She has elevated blood pressure, managed with 5 mg of amlodipine . Her blood pressure was slightly elevated during the visit, attributed to anxiety. 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    Past Medical History:  Diagnosis Date   Anxiety    Arthritis    right foot   Cirrhosis (HCC)    Depression    GERD (gastroesophageal reflux disease)    History of  alcoholism (HCC)    History of cellulitis    2015  right side face   Hypertension    followed by pcp  (per pt never had stress test)   Malnutrition (HCC)    Metatarsal fracture    right 2nd   Ovarian cyst    Peripheral neuropathy    feet   Strachan's syndrome    painful neuropathy   Wears dentures    upper   Wears glasses     Past Surgical History:  Procedure Laterality Date   ABDOMINAL HYSTERECTOMY  2024   Partial hysterectomy,   BREAST BIOPSY Right 2023   CHOLECYSTECTOMY N/A 06/15/2023   Procedure: LAPAROSCOPIC CHOLECYSTECTOMY;  Surgeon: Dasie Leonor CROME, MD;  Location: [facility];  Service: General;  Laterality: N/A;   MULTIPLE EXTRACTIONS WITH ALVEOLOPLASTY N/A 12/25/2013   Procedure: Extraction of tooth #'s 2,3,4,5,6,8,9,10,11,12,13,14,15,20,21,22,23,24,25,26,27, 71,70,69,68 with alveoloplasty and bilateral mandibular tori reductions;  Surgeon: Tanda JULIANNA Fanny, DDS;  Location: [facility];  Service: Oral Surgery;  Laterality: N/A;   ORIF FOOT FRACTURE Right    multiple  metatarsal fxs   ORIF TOE FRACTURE Right 07/03/2019   Procedure: OPEN REDUCTION INTERNAL FIXATION (ORIF) METATARSAL (TOE) FRACTURE;  Surgeon: Gershon Donnice SAUNDERS, [name];  Location: [facility];  Service: Podiatry;  Laterality: Right;  LEG BLOCK   SURAL NERVE BX Right 12/24/2013   Procedure: SURAL NERVE BIOPSY;  Surgeon: Victory Gens, MD;  Location: [facility];  Service: Neurosurgery;  Laterality: Right;   TUBAL LIGATION Bilateral 2002   PPTL    Family History  Adopted: Yes  Problem Relation Age of Onset   Hypertension Mother  Raynaud syndrome Mother    Diabetes Father    Drug abuse Maternal Grandmother    Cancer Maternal Grandmother        intestinal   Hypertension Maternal Grandmother    Lung cancer Maternal Grandfather    Hypertension Paternal Grandfather    Diabetes Maternal Aunt    Heart disease Maternal Aunt    Diabetes Maternal Uncle    Heart disease Maternal Uncle    Colon cancer  Neg Hx    Colon polyps Neg Hx    Esophageal cancer Neg Hx    Rectal cancer Neg Hx    Stomach cancer Neg Hx    Breast cancer Neg Hx    Ovarian cancer Neg Hx    Endometrial cancer Neg Hx     Social History   Socioeconomic History   Marital status: Widowed    Spouse name: Not on file   Number of children: 1   Years of education: hs   Highest education level: Not on file  Occupational History   Occupation: food service  Tobacco Use   Smoking status: Every Day    Current packs/day: 0.50    Average packs/day: 0.5 packs/day for 20.0 years (10.0 ttl pk-yrs)    Types: Cigarettes    Passive exposure: Current   Smokeless tobacco: Never  Vaping Use   Vaping status: Never Used  Substance and Sexual Activity   Alcohol  use: Yes    Comment: occasional per patient on 05-31-2023   Drug use: Not Currently    Comment: 07-02-2019  -- per pt last used in age 23s   Sexual activity: Yes    Partners: Male    Birth control/protection: Surgical  Other Topics Concern   Not on file  Social History Narrative   patient is widowed.  Patient lives with daughter in a 2 story home.    Patient works in Personnel officer.    Education high school.   Right handed.   Caffeine sometimes tea.   Lives in a one story home   Social Drivers of Health   Financial Resource Strain: Low Risk  (09/20/2023)   Overall Financial Resource Strain (CARDIA)    Difficulty of Paying Living Expenses: Not very hard  Food Insecurity: No Food Insecurity (09/20/2023)   Hunger Vital Sign    Worried About Running Out of Food in the Last Year: Never true    Ran Out of Food in the Last Year: Never true  Transportation Needs: No Transportation Needs (09/20/2023)   PRAPARE - Administrator, Civil Service (Medical): No    Lack of Transportation (Non-Medical): No  Physical Activity: Insufficiently Active (09/20/2023)   Exercise Vital Sign    Days of Exercise per Week: 1 day    Minutes of Exercise per Session: 10 min  Stress:  No Stress Concern Present (09/20/2023)   [employer] of Occupational Health - Occupational Stress Questionnaire    Feeling of Stress : Only a little  Social Connections: Socially Integrated (09/20/2023)   Social Connection and Isolation Panel    Frequency of Communication with Friends and Family: More than three times a week    Frequency of Social Gatherings with Friends and Family: Three times a week    Attends Religious Services: 1 to 4 times per year    Active Member of Clubs or Organizations: Yes    Attends Banker Meetings: 1 to 4 times per year    Marital Status: Living with partner  Allergies  Allergen Reactions   Cymbalta  [Duloxetine  Hcl] Other (See Comments)    Avoid due to Cirrhosis Pt on 6 week taper   Macrobid  [Nitrofurantoin  Macrocrystal] Anaphylaxis and Other (See Comments)    Patient reports 2 syncope episodes after her mouth became numb    Outpatient Medications Prior to Visit  Medication Sig Dispense Refill   amLODipine  (NORVASC ) 5 MG tablet TAKE 1 TABLET (5 MG TOTAL) BY MOUTH DAILY. 90 tablet 1   buPROPion  (WELLBUTRIN  SR) 200 MG 12 hr tablet TAKE 1 TABLET BY MOUTH TWICE A DAY 180 tablet 1   busPIRone  (BUSPAR ) 30 MG tablet TAKE 1 TABLET (30 MG TOTAL) BY MOUTH IN THE MORNING AND AT BEDTIME. 180 tablet 1   Cyanocobalamin  (B-12 PO) Take 1 tablet by mouth daily.     ezetimibe  (ZETIA ) 10 MG tablet TAKE 1 TABLET BY MOUTH EVERY DAY 90 tablet 1   FOLIC ACID  PO Take 1 capsule by mouth daily.     Multiple Vitamins-Minerals (MULTIVITAMIN WITH MINERALS) tablet Take 1 tablet by mouth daily.     omeprazole  (PRILOSEC) 20 MG capsule TAKE 1 CAPSULE BY MOUTH EVERY DAY 90 capsule 1   pregabalin  (LYRICA ) 150 MG capsule TAKE 1 CAPSULE BY MOUTH THREE TIMES DAILY 90 capsule 5   Thiamine  HCl (B-1 PO) Take 1 capsule by mouth daily.     traZODone  (DESYREL ) 150 MG tablet Take 1 tablet (150 mg total) by mouth at bedtime. 90 tablet 1   cephALEXin  (KEFLEX ) 500 MG capsule Take 1  capsule (500 mg total) by mouth 3 (three) times daily. (Patient not taking: Reported on 04/23/2024) 21 capsule 0   No facility-administered medications prior to visit.     ROS Review of Systems  Constitutional:  Negative for activity change and appetite change.  HENT:  Negative for sinus pressure and sore throat.   Respiratory:  Negative for chest tightness, shortness of breath and wheezing.   Cardiovascular:  Negative for chest pain and palpitations.  Gastrointestinal:  Negative for abdominal distention, abdominal pain and constipation.  Genitourinary: Negative.   Musculoskeletal: Negative.   Skin:  Positive for color change.  Neurological:  Positive for numbness.  Psychiatric/Behavioral:  Negative for behavioral problems and dysphoric mood.     Objective:  BP (!) 157/90   Pulse 65   Ht 5' 4 (1.626 m)   Wt 127 lb (57.6 kg)   LMP  (LMP Unknown)   SpO2 99%   BMI 21.80 kg/m      04/23/2024    3:10 PM 04/23/2024    2:30 PM 04/01/2024    3:37 PM  BP/Weight  Systolic BP 157 144 138  Diastolic BP 90 82 82  Wt. (Lbs)  127 128  BMI  21.8 kg/m2 21.97 kg/m2      Physical Exam Constitutional:      Appearance: She is well-developed.  Cardiovascular:     Rate and Rhythm: Normal rate.     Pulses: Normal pulses.     Heart sounds: Normal heart sounds. No murmur heard. Pulmonary:     Effort: Pulmonary effort is normal.     Breath sounds: Normal breath sounds. No wheezing or rales.  Chest:     Chest wall: No tenderness.  Abdominal:     General: Bowel sounds are normal. There is no distension.     Palpations: Abdomen is soft. There is no mass.     Tenderness: There is no abdominal tenderness.  Musculoskeletal:  General: Normal range of motion.     Right lower leg: No edema.     Left lower leg: No edema.  Skin:    Comments: Slight erythema of skin of leegs  Neurological:     Mental Status: She is alert and oriented to person, place, and time.  Psychiatric:         Mood and Affect: Mood normal.        Latest Ref Rng & Units 04/23/2024    3:24 PM 10/05/2023    2:43 PM 09/20/2023    3:21 PM  CMP  Glucose 70 - 99 mg/dL 74  86  87   BUN 6 - 24 mg/dL 8  7  9    Creatinine 0.57 - 1.00 mg/dL 9.32  9.31  9.33   Sodium 134 - 144 mmol/L 138  139  142   Potassium 3.5 - 5.2 mmol/L 4.3  4.2  3.9   Chloride 96 - 106 mmol/L 94  97  96   CO2 20 - 29 mmol/L 26  22  23    Calcium  8.7 - 10.2 mg/dL 9.9  9.3  9.6   Total Protein 6.0 - 8.5 g/dL 7.8  8.1    7.4  8.0   Total Bilirubin 0.0 - 1.2 mg/dL 0.5  0.4    0.3  0.5   Alkaline Phos 44 - 121 IU/L 173  190    239  236   AST 0 - 40 IU/L 83  68    76  37   ALT 0 - 32 IU/L 37  37    41  32     Lipid Panel     Component Value Date/Time   CHOL 228 (H) 04/23/2024 1524   TRIG 106 04/23/2024 1524   HDL 100 04/23/2024 1524   CHOLHDL 5.8 (H) 11/17/2022 1503   CHOLHDL 4.0 07/19/2016 1238   VLDL 26 07/19/2016 1238   LDLCALC 110 (H) 04/23/2024 1524    CBC    Component Value Date/Time   WBC 9.2 04/23/2024 1524   WBC 11.0 (H) 05/31/2023 1442   RBC 3.98 04/23/2024 1524   RBC 4.30 05/31/2023 1442   HGB 14.4 04/23/2024 1524   HCT 41.3 04/23/2024 1524   PLT 198 04/23/2024 1524   MCV 104 (H) 04/23/2024 1524   MCH 36.2 (H) 04/23/2024 1524   MCH 34.0 05/31/2023 1442   MCHC 34.9 04/23/2024 1524   MCHC 33.8 05/31/2023 1442   RDW 11.6 (L) 04/23/2024 1524   LYMPHSABS 3.0 04/23/2024 1524   MONOABS 0.7 01/09/2023 1242   EOSABS 0.1 04/23/2024 1524   BASOSABS 0.0 04/23/2024 1524    Lab Results  Component Value Date   HGBA1C 5.3 08/31/2021    The 10-year ASCVD risk score (Arnett DK, et al., 2019) is: 4.8%   Values used to calculate the score:     Age: 66 years     Clincally relevant sex: Female     Is Non-Hispanic African American: No     Diabetic: No     Tobacco smoker: Yes     Systolic Blood Pressure: 157 mmHg     Is BP treated: Yes     HDL Cholesterol: 100 mg/dL     Total Cholesterol: 228 mg/dL      Assessment & Plan Alcoholic cirrhosis of liver Cirrhosis with dissatisfaction towards current gastroenterologist. Abstinence from alcohol  is beneficial for liver health. - Refer to a hepatologist for specialized liver care. - Check liver enzymes as she  has not been assessed since January.  Peripheral neuropathy (Strachan's syndrome) Neuropathy symptoms include red legs and pale feet, attributed to neuropathy rather than circulation issues. Strong pulses rule out peripheral vascular disease. - Continue Lyrica  for neuropathy. -Follow up with Neurology - Consider compression stockings to manage swelling from standing.  Dermatitis Could explain pedal erythema as PVD ruled out with normal pulses  Essential hypertension Blood pressure slightly elevated, possibly due to anxiety. Current medication is amlodipine  5 mg daily. - Recheck blood pressure in one month to confirm if elevation is persistent. - Advise on a low sodium diet and avoiding coffee and cigarettes before appointments.  Mixed hyperlipidemia Cholesterol slightly elevated in January. Currently taking ezetimibe  due to concerns with liver enzymes and atorvastatin . - Check cholesterol levels with upcoming blood work.   Anxiety and Depression On Buspar  and Wellbutrin      No orders of the defined types were placed in this encounter.   Follow-up: Return in about 1 month (around 05/24/2024) for Blood Pressure follow-up with PCP.       Corrina Sabin, MD, FAAFP. [facility] [place], [place] [phone]   04/25/2024, 9:35 AM

## 2024-04-23 NOTE — Patient Instructions (Signed)
 VISIT SUMMARY:  Today, you were seen for a referral to a liver specialist and concerns about leg circulation. We discussed your history of cirrhosis, neuropathy, elevated cholesterol, and high blood pressure. You have stopped drinking alcohol , which is beneficial for your liver health. Your current medications and lifestyle habits were reviewed, and we made some adjustments to your care plan.  YOUR PLAN:  -ALCOHOLIC CIRRHOSIS OF LIVER: Cirrhosis is a condition where the liver is severely scarred, often due to long-term damage. You will be referred to a hepatologist for specialized liver care, and we will check your liver enzymes since they have not been assessed since January.  -PERIPHERAL NEUROPATHY (STRACHAN'S SYNDROME): Peripheral neuropathy is a condition that results in numbness, tingling, and pain in the extremities. You should continue taking Lyrica  for your symptoms and consider using compression stockings to manage the swelling in your legs from standing.  -ESSENTIAL HYPERTENSION: Hypertension is high blood pressure. Your blood pressure was slightly elevated today, possibly due to anxiety. We will recheck it in one month to see if it remains high. In the meantime, follow a low sodium diet and avoid coffee and cigarettes before appointments.  -MIXED HYPERLIPIDEMIA: Mixed hyperlipidemia is having high levels of different types of cholesterol in the blood. Your cholesterol was slightly elevated in January, and you are currently taking ezetimibe . We will check your cholesterol levels with your upcoming blood work.  INSTRUCTIONS:  You will be referred to a hepatologist for specialized liver care. Please have your liver enzymes checked as they have not been assessed since January. Recheck your blood pressure in one month to confirm if the elevation is persistent. Follow a low sodium diet and avoid coffee and cigarettes before appointments. We will also check your cholesterol levels with your  upcoming blood work.

## 2024-04-24 ENCOUNTER — Ambulatory Visit: Payer: Self-pay | Admitting: Family Medicine

## 2024-04-24 LAB — CMP14+EGFR
ALT: 37 IU/L — ABNORMAL HIGH (ref 0–32)
AST: 83 IU/L — ABNORMAL HIGH (ref 0–40)
Albumin: 4.6 g/dL (ref 3.8–4.9)
Alkaline Phosphatase: 173 IU/L — ABNORMAL HIGH (ref 44–121)
BUN/Creatinine Ratio: 12 (ref 9–23)
BUN: 8 mg/dL (ref 6–24)
Bilirubin Total: 0.5 mg/dL (ref 0.0–1.2)
CO2: 26 mmol/L (ref 20–29)
Calcium: 9.9 mg/dL (ref 8.7–10.2)
Chloride: 94 mmol/L — ABNORMAL LOW (ref 96–106)
Creatinine, Ser: 0.67 mg/dL (ref 0.57–1.00)
Globulin, Total: 3.2 g/dL (ref 1.5–4.5)
Glucose: 74 mg/dL (ref 70–99)
Potassium: 4.3 mmol/L (ref 3.5–5.2)
Sodium: 138 mmol/L (ref 134–144)
Total Protein: 7.8 g/dL (ref 6.0–8.5)
eGFR: 105 mL/min/1.73 (ref >59)

## 2024-04-24 LAB — CBC WITH DIFFERENTIAL/PLATELET
Basophils Absolute: 0 x10E3/uL (ref 0.0–0.2)
Basos: 0 %
EOS (ABSOLUTE): 0.1 x10E3/uL (ref 0.0–0.4)
Eos: 1 %
Hematocrit: 41.3 % (ref 34.0–46.6)
Hemoglobin: 14.4 g/dL (ref 11.1–15.9)
Immature Grans (Abs): 0 x10E3/uL (ref 0.0–0.1)
Immature Granulocytes: 0 %
Lymphocytes Absolute: 3 x10E3/uL (ref 0.7–3.1)
Lymphs: 32 %
MCH: 36.2 pg — ABNORMAL HIGH (ref 26.6–33.0)
MCHC: 34.9 g/dL (ref 31.5–35.7)
MCV: 104 fL — ABNORMAL HIGH (ref 79–97)
Monocytes Absolute: 0.8 x10E3/uL (ref 0.1–0.9)
Monocytes: 9 %
Neutrophils Absolute: 5.3 x10E3/uL (ref 1.4–7.0)
Neutrophils: 58 %
Platelets: 198 x10E3/uL (ref 150–450)
RBC: 3.98 x10E6/uL (ref 3.77–5.28)
RDW: 11.6 % — ABNORMAL LOW (ref 11.7–15.4)
WBC: 9.2 x10E3/uL (ref 3.4–10.8)

## 2024-04-24 LAB — LP+NON-HDL CHOLESTEROL
Cholesterol, Total: 228 mg/dL — ABNORMAL HIGH (ref 100–199)
HDL: 100 mg/dL (ref >39)
LDL Chol Calc (NIH): 110 mg/dL — ABNORMAL HIGH (ref 0–99)
Total Non-HDL-Chol (LDL+VLDL): 128 mg/dL (ref 0–129)
Triglycerides: 106 mg/dL (ref 0–149)
VLDL Cholesterol Cal: 18 mg/dL (ref 5–40)

## 2024-04-24 LAB — AFP TUMOR MARKER: AFP, Serum, Tumor Marker: 4.2 ng/mL (ref 0.0–9.2)

## 2024-04-24 LAB — PROTIME-INR
INR: 1 (ref 0.9–1.2)
Prothrombin Time: 10.5 s (ref 9.1–12.0)

## 2024-04-25 ENCOUNTER — Encounter: Payer: Self-pay | Admitting: Family Medicine

## 2024-05-27 ENCOUNTER — Ambulatory Visit: Payer: MEDICAID | Attending: Family Medicine | Admitting: Family Medicine

## 2024-05-27 ENCOUNTER — Encounter: Payer: Self-pay | Admitting: Family Medicine

## 2024-05-27 VITALS — BP 111/66 | HR 63 | Ht 64.0 in | Wt 124.0 lb

## 2024-05-27 DIAGNOSIS — I10 Essential (primary) hypertension: Secondary | ICD-10-CM

## 2024-05-27 DIAGNOSIS — F17210 Nicotine dependence, cigarettes, uncomplicated: Secondary | ICD-10-CM

## 2024-05-27 DIAGNOSIS — K7030 Alcoholic cirrhosis of liver without ascites: Secondary | ICD-10-CM | POA: Diagnosis not present

## 2024-05-27 DIAGNOSIS — Z79899 Other long term (current) drug therapy: Secondary | ICD-10-CM | POA: Diagnosis not present

## 2024-05-27 DIAGNOSIS — Z23 Encounter for immunization: Secondary | ICD-10-CM

## 2024-05-27 NOTE — Progress Notes (Signed)
 Subjective:  Patient ID: DEBBRA Fry, female    DOB: December 03, 1970  Age: 53 y.o. MRN: [id number]  CC: Hypertension     Discussed the use of AI scribe software for clinical note transcription with the patient, who gave verbal consent to proceed.  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  Her blood pressure was elevated at her last visit hence this visit was scheduled to follow-up on that. She is currently on Zetia  for cholesterol management. She experiences occasional sharp abdominal pain and is concerned about ascites, although a February ultrasound showed no evidence of it. There is no swelling of the ankles, abdominal swelling, or jaundice.  She has unintentional weight loss, attributed to her busy work schedule leading to skipped meals. Alcohol  intake is significantly reduced, with only occasional consumption.  She has an upcoming appointment with [facility] for management of her cirrhosis.  She has received two doses of the hepatitis B vaccine. No bleeding or easy bruising, but a recent cut bled for an extended period.    Past Medical History:  Diagnosis Date   Anxiety    Arthritis    right foot   Cirrhosis (HCC)    Depression    GERD (gastroesophageal reflux disease)    History of alcoholism (HCC)    History of cellulitis    2015  right side face   Hypertension    followed by pcp  (per pt never had stress test)   Malnutrition (HCC)    Metatarsal fracture    right 2nd   Ovarian cyst    Peripheral neuropathy    feet   Strachan's syndrome     painful neuropathy   Wears dentures    upper   Wears glasses     Past Surgical History:  Procedure Laterality Date   ABDOMINAL HYSTERECTOMY  2024   Partial hysterectomy,   BREAST BIOPSY Right 2023   CHOLECYSTECTOMY N/A 06/15/2023   Procedure: LAPAROSCOPIC CHOLECYSTECTOMY;  Surgeon: Dasie Leonor CROME, MD;  Location: [facility];  Service: General;  Laterality: N/A;   MULTIPLE EXTRACTIONS WITH ALVEOLOPLASTY N/A 12/25/2013   Procedure: Extraction of tooth #'s 2,3,4,5,6,8,9,10,11,12,13,14,15,20,21,22,23,24,25,26,27, 71,70,69,68 with alveoloplasty and bilateral mandibular tori reductions;  Surgeon: Tanda JULIANNA Fanny, DDS;  Location: [facility] OR;  Service: Oral Surgery;  Laterality: N/A;   ORIF FOOT FRACTURE Right    multiple  metatarsal fxs   ORIF TOE FRACTURE Right 07/03/2019   Procedure: OPEN REDUCTION INTERNAL FIXATION (ORIF) METATARSAL (TOE) FRACTURE;  Surgeon: Gershon Donnice SAUNDERS, [name];  Location: [facility];  Service: Podiatry;  Laterality: Right;  LEG BLOCK   SURAL NERVE BX Right 12/24/2013   Procedure: SURAL NERVE BIOPSY;  Surgeon: Victory Gens, MD;  Location: [facility];  Service: Neurosurgery;  Laterality: Right;   TUBAL LIGATION Bilateral 2002   PPTL    Family History  Adopted: Yes  Problem Relation Age of Onset   Hypertension Mother    Raynaud syndrome Mother    Diabetes Father    Drug abuse Maternal Grandmother    Cancer Maternal Grandmother        intestinal   Hypertension Maternal Grandmother  Lung cancer Maternal Grandfather    Hypertension Paternal Grandfather    Diabetes Maternal Aunt    Heart disease Maternal Aunt    Diabetes Maternal Uncle    Heart disease Maternal Uncle    Colon cancer Neg Hx    Colon polyps Neg Hx    Esophageal cancer Neg Hx    Rectal cancer Neg Hx    Stomach cancer Neg Hx    Breast cancer Neg Hx    Ovarian cancer Neg Hx    Endometrial cancer Neg Hx     Social History   Socioeconomic History   Marital status: Widowed     Spouse name: Not on file   Number of children: 1   Years of education: hs   Highest education level: Not on file  Occupational History   Occupation: food service  Tobacco Use   Smoking status: Every Day    Current packs/day: 0.50    Average packs/day: 0.5 packs/day for 20.0 years (10.0 ttl pk-yrs)    Types: Cigarettes    Passive exposure: Current   Smokeless tobacco: Never  Vaping Use   Vaping status: Never Used  Substance and Sexual Activity   Alcohol  use: Yes    Comment: occasional per patient on 05-31-2023   Drug use: Not Currently    Comment: 07-02-2019  -- per pt last used in age 35s   Sexual activity: Yes    Partners: Male    Birth control/protection: Surgical  Other Topics Concern   Not on file  Social History Narrative   patient is widowed.  Patient lives with daughter in a 2 story home.    Patient works in Personnel officer.    Education high school.   Right handed.   Caffeine sometimes tea.   Lives in a one story home   Social Drivers of Health   Financial Resource Strain: Low Risk  (09/20/2023)   Overall Financial Resource Strain (CARDIA)    Difficulty of Paying Living Expenses: Not very hard  Food Insecurity: No Food Insecurity (09/20/2023)   Hunger Vital Sign    Worried About Running Out of Food in the Last Year: Never true    Ran Out of Food in the Last Year: Never true  Transportation Needs: No Transportation Needs (09/20/2023)   PRAPARE - Administrator, Civil Service (Medical): No    Lack of Transportation (Non-Medical): No  Physical Activity: Insufficiently Active (09/20/2023)   Exercise Vital Sign    Days of Exercise per Week: 1 day    Minutes of Exercise per Session: 10 min  Stress: No Stress Concern Present (09/20/2023)   [employer] of Occupational Health - Occupational Stress Questionnaire    Feeling of Stress : Only a little  Social Connections: Socially Integrated (09/20/2023)   Social Connection and Isolation Panel    Frequency of  Communication with Friends and Family: More than three times a week    Frequency of Social Gatherings with Friends and Family: Three times a week    Attends Religious Services: 1 to 4 times per year    Active Member of Clubs or Organizations: Yes    Attends Banker Meetings: 1 to 4 times per year    Marital Status: Living with partner    Allergies  Allergen Reactions   Cymbalta  [Duloxetine  Hcl] Other (See Comments)    Avoid due to Cirrhosis Pt on 6 week taper   Macrobid  [Nitrofurantoin  Macrocrystal] Anaphylaxis and Other (See Comments)  Patient reports 2 syncope episodes after her mouth became numb    Outpatient Medications Prior to Visit  Medication Sig Dispense Refill   amLODipine  (NORVASC ) 5 MG tablet TAKE 1 TABLET (5 MG TOTAL) BY MOUTH DAILY. 90 tablet 1   buPROPion  (WELLBUTRIN  SR) 200 MG 12 hr tablet TAKE 1 TABLET BY MOUTH TWICE A DAY 180 tablet 1   busPIRone  (BUSPAR ) 30 MG tablet TAKE 1 TABLET (30 MG TOTAL) BY MOUTH IN THE MORNING AND AT BEDTIME. 180 tablet 1   cephALEXin  (KEFLEX ) 500 MG capsule Take 1 capsule (500 mg total) by mouth 3 (three) times daily. (Patient not taking: Reported on 04/23/2024) 21 capsule 0   Cyanocobalamin  (B-12 PO) Take 1 tablet by mouth daily.     ezetimibe  (ZETIA ) 10 MG tablet TAKE 1 TABLET BY MOUTH EVERY DAY 90 tablet 1   FOLIC ACID  PO Take 1 capsule by mouth daily.     Multiple Vitamins-Minerals (MULTIVITAMIN WITH MINERALS) tablet Take 1 tablet by mouth daily.     omeprazole  (PRILOSEC) 20 MG capsule TAKE 1 CAPSULE BY MOUTH EVERY DAY 90 capsule 1   pregabalin  (LYRICA ) 150 MG capsule TAKE 1 CAPSULE BY MOUTH THREE TIMES DAILY 90 capsule 5   Thiamine  HCl (B-1 PO) Take 1 capsule by mouth daily.     traZODone  (DESYREL ) 150 MG tablet Take 1 tablet (150 mg total) by mouth at bedtime. 90 tablet 1   No facility-administered medications prior to visit.     ROS Review of Systems  Constitutional:  Negative for activity change and appetite  change.  HENT:  Negative for sinus pressure and sore throat.   Respiratory:  Negative for chest tightness, shortness of breath and wheezing.   Cardiovascular:  Negative for chest pain and palpitations.  Gastrointestinal:  Negative for abdominal distention, abdominal pain and constipation.  Genitourinary: Negative.   Musculoskeletal: Negative.   Psychiatric/Behavioral:  Negative for behavioral problems and dysphoric mood.     Objective:  BP 111/66   Pulse 63   Ht 5' 4 (1.626 m)   Wt 124 lb (56.2 kg)   LMP  (LMP Unknown)   SpO2 99%   BMI 21.28 kg/m      05/27/2024    4:32 PM 04/23/2024    3:10 PM 04/23/2024    2:30 PM  BP/Weight  Systolic BP 111 157 144  Diastolic BP 66 90 82  Wt. (Lbs) 124  127  BMI 21.28 kg/m2  21.8 kg/m2      Physical Exam Constitutional:      Appearance: She is well-developed.  Cardiovascular:     Rate and Rhythm: Normal rate.     Heart sounds: Normal heart sounds. No murmur heard. Pulmonary:     Effort: Pulmonary effort is normal.     Breath sounds: Normal breath sounds. No wheezing or rales.  Chest:     Chest wall: No tenderness.  Abdominal:     General: Bowel sounds are normal. There is no distension.     Palpations: Abdomen is soft. There is no mass.     Tenderness: There is no abdominal tenderness.  Musculoskeletal:        General: Normal range of motion.     Right lower leg: No edema.     Left lower leg: No edema.  Neurological:     Mental Status: She is alert and oriented to person, place, and time.  Psychiatric:        Mood and Affect: Mood normal.  Latest Ref Rng & Units 04/23/2024    3:24 PM 10/05/2023    2:43 PM 09/20/2023    3:21 PM  CMP  Glucose 70 - 99 mg/dL 74  86  87   BUN 6 - 24 mg/dL 8  7  9    Creatinine 0.57 - 1.00 mg/dL 9.32  9.31  9.33   Sodium 134 - 144 mmol/L 138  139  142   Potassium 3.5 - 5.2 mmol/L 4.3  4.2  3.9   Chloride 96 - 106 mmol/L 94  97  96   CO2 20 - 29 mmol/L 26  22  23    Calcium  8.7 -  10.2 mg/dL 9.9  9.3  9.6   Total Protein 6.0 - 8.5 g/dL 7.8  8.1    7.4  8.0   Total Bilirubin 0.0 - 1.2 mg/dL 0.5  0.4    0.3  0.5   Alkaline Phos 44 - 121 IU/L 173  190    239  236   AST 0 - 40 IU/L 83  68    76  37   ALT 0 - 32 IU/L 37  37    41  32     Lipid Panel     Component Value Date/Time   CHOL 228 (H) 04/23/2024 1524   TRIG 106 04/23/2024 1524   HDL 100 04/23/2024 1524   CHOLHDL 5.8 (H) 11/17/2022 1503   CHOLHDL 4.0 07/19/2016 1238   VLDL 26 07/19/2016 1238   LDLCALC 110 (H) 04/23/2024 1524    CBC    Component Value Date/Time   WBC 9.2 04/23/2024 1524   WBC 11.0 (H) 05/31/2023 1442   RBC 3.98 04/23/2024 1524   RBC 4.30 05/31/2023 1442   HGB 14.4 04/23/2024 1524   HCT 41.3 04/23/2024 1524   PLT 198 04/23/2024 1524   MCV 104 (H) 04/23/2024 1524   MCH 36.2 (H) 04/23/2024 1524   MCH 34.0 05/31/2023 1442   MCHC 34.9 04/23/2024 1524   MCHC 33.8 05/31/2023 1442   RDW 11.6 (L) 04/23/2024 1524   LYMPHSABS 3.0 04/23/2024 1524   MONOABS 0.7 01/09/2023 1242   EOSABS 0.1 04/23/2024 1524   BASOSABS 0.0 04/23/2024 1524    Lab Results  Component Value Date   HGBA1C 5.3 08/31/2021       Assessment & Plan Alcoholic cirrhosis of liver Chronic alcoholic cirrhosis with elevated but improved liver enzymes. No ascites or jaundice. Occasional alcohol  consumption persists. - Follow-up with GI specialist in November for evaluation, including potential endoscopy for esophageal varices and portal hypertension. - Monitor liver enzymes regularly. - Encouraged complete abstinence from alcohol .  Essential hypertension Hypertension well-controlled with medication. Recent BP 111/66 mmHg. -She attributes elevation at last visit to anxiety and rushing to this appointment - Continue current antihypertensive regimen.       Healthcare maintenance Need for pneumonia vaccine-PCV 20 administered Need for influenza vaccine-flu shot administered I do not see evidence of  hepatitis B immunizations but I do see hepatitis A immunization.  Will check hepatitis B titer at her next visit to determine if she needs vaccine  No orders of the defined types were placed in this encounter.   Follow-up: Return in about 6 months (around 11/24/2024) for Chronic medical conditions.       Corrina Sabin, MD, FAAFP. [facility] [place], [place] [phone]   05/27/2024, 6:03 PM

## 2024-05-27 NOTE — Patient Instructions (Signed)
 VISIT SUMMARY:  Today, we reviewed your blood pressure and liver condition. Your blood pressure is well-controlled, and your liver enzymes have improved. We discussed your occasional abdominal pain and concerns about potential complications from cirrhosis.  YOUR PLAN:  -ALCOHOLIC CIRRHOSIS OF LIVER: Alcoholic cirrhosis is a condition where the liver is damaged due to long-term alcohol  use. Your liver enzymes have improved, and there is no evidence of ascites or jaundice. You should follow up with a GI specialist in November for further evaluation, including a possible endoscopy to check for esophageal varices and portal hypertension. It's important to monitor your liver enzymes regularly and to completely abstain from alcohol .  -ESSENTIAL HYPERTENSION: Hypertension, or high blood pressure, is well-controlled with your current medication. Your recent blood pressure reading was 111/66 mmHg. Continue taking your antihypertensive medication as prescribed.  -MIXED HYPERLIPIDEMIA: Mixed hyperlipidemia is a condition with high levels of cholesterol and other fats in the blood. You are managing this with dietary changes and ezetimibe . Continue taking ezetimibe  and follow dietary recommendations to help control your cholesterol levels.  INSTRUCTIONS:  Please follow up with your GI specialist in November for further evaluation of your liver condition, including a possible endoscopy. Continue monitoring your liver enzymes regularly. Maintain your current antihypertensive regimen and continue taking ezetimibe  for cholesterol management. Abstain from alcohol  completely.

## 2024-06-10 ENCOUNTER — Emergency Department (HOSPITAL_COMMUNITY)
Admission: EM | Admit: 2024-06-10 | Discharge: 2024-06-11 | Disposition: A | Discharge status: Home / Self Care | Payer: MEDICAID | Attending: Emergency Medicine | Admitting: Emergency Medicine

## 2024-06-10 ENCOUNTER — Emergency Department (HOSPITAL_COMMUNITY): Payer: MEDICAID

## 2024-06-10 DIAGNOSIS — R1110 Vomiting, unspecified: Secondary | ICD-10-CM | POA: Insufficient documentation

## 2024-06-10 DIAGNOSIS — R1011 Right upper quadrant pain: Secondary | ICD-10-CM | POA: Diagnosis present

## 2024-06-10 DIAGNOSIS — F17200 Nicotine dependence, unspecified, uncomplicated: Secondary | ICD-10-CM | POA: Insufficient documentation

## 2024-06-10 LAB — COMPREHENSIVE METABOLIC PANEL WITH GFR
ALT: 53 U/L — ABNORMAL HIGH (ref 0–44)
AST: 100 U/L — ABNORMAL HIGH (ref 15–41)
Albumin: 3.7 g/dL (ref 3.5–5.0)
Alkaline Phosphatase: 142 U/L — ABNORMAL HIGH (ref 38–126)
Anion gap: 12 (ref 5–15)
BUN: <5 mg/dL — ABNORMAL LOW (ref 6–20)
CO2: 31 mmol/L (ref 22–32)
Calcium: 9.1 mg/dL (ref 8.9–10.3)
Chloride: 93 mmol/L — ABNORMAL LOW (ref 98–111)
Creatinine, Ser: 0.61 mg/dL (ref 0.44–1.00)
GFR, Estimated: >60 mL/min (ref >60)
Glucose, Bld: 96 mg/dL (ref 70–99)
Potassium: 3.5 mmol/L (ref 3.5–5.1)
Sodium: 136 mmol/L (ref 135–145)
Total Bilirubin: 0.6 mg/dL (ref 0.0–1.2)
Total Protein: 7.6 g/dL (ref 6.5–8.1)

## 2024-06-10 LAB — URINALYSIS, ROUTINE W REFLEX MICROSCOPIC
Bilirubin Urine: NEGATIVE
Glucose, UA: NEGATIVE mg/dL
Hgb urine dipstick: NEGATIVE
Ketones, ur: 5 mg/dL — AB
Leukocytes,Ua: NEGATIVE
Nitrite: NEGATIVE
Protein, ur: NEGATIVE mg/dL
Specific Gravity, Urine: 1.004 — ABNORMAL LOW (ref 1.005–1.030)
pH: 7 (ref 5.0–8.0)

## 2024-06-10 LAB — CBC WITH DIFFERENTIAL/PLATELET
Abs Immature Granulocytes: 0.03 K/uL (ref 0.00–0.07)
Basophils Absolute: 0 K/uL (ref 0.0–0.1)
Basophils Relative: 0 %
Eosinophils Absolute: 0.1 K/uL (ref 0.0–0.5)
Eosinophils Relative: 1 %
HCT: 43.7 % (ref 36.0–46.0)
Hemoglobin: 15.3 g/dL — ABNORMAL HIGH (ref 12.0–15.0)
Immature Granulocytes: 0 %
Lymphocytes Relative: 24 %
Lymphs Abs: 2.3 K/uL (ref 0.7–4.0)
MCH: 36.3 pg — ABNORMAL HIGH (ref 26.0–34.0)
MCHC: 35 g/dL (ref 30.0–36.0)
MCV: 103.6 fL — ABNORMAL HIGH (ref 80.0–100.0)
Monocytes Absolute: 0.9 K/uL (ref 0.1–1.0)
Monocytes Relative: 9 %
Neutro Abs: 6.4 K/uL (ref 1.7–7.7)
Neutrophils Relative %: 66 %
Platelets: 178 K/uL (ref 150–400)
RBC: 4.22 MIL/uL (ref 3.87–5.11)
RDW: 12.2 % (ref 11.5–15.5)
WBC: 9.8 K/uL (ref 4.0–10.5)
nRBC: 0 % (ref 0.0–0.2)

## 2024-06-10 LAB — AMMONIA: Ammonia: <13 umol/L (ref 9–35)

## 2024-06-10 MED ORDER — MORPHINE SULFATE (PF) 4 MG/ML IV SOLN
4.0000 mg | Freq: Once | INTRAVENOUS | Status: AC
  Administered 2024-06-10: 4 mg via INTRAVENOUS
  Filled 2024-06-10: qty 1

## 2024-06-10 MED ORDER — IOHEXOL 350 MG/ML SOLN
60.0000 mL | Freq: Once | INTRAVENOUS | Status: AC | PRN
  Administered 2024-06-10: 60 mL via INTRAVENOUS

## 2024-06-10 NOTE — ED Notes (Signed)
 Pt returned from CT

## 2024-06-10 NOTE — ED Provider Triage Note (Signed)
 Emergency Medicine Provider Triage Evaluation Note  [name] , a 53 y.o. female  was evaluated in triage.  Pt complains of right upper quadrant abdominal pain, abdominal distention, shortness of breath.  Patient has a history of cirrhosis.  She has a history of cholecystectomy.  She reports several days of right upper quadrant abdominal pain, sharp and stabbing, intermittent, with associated occasional vomiting.  She feels short of breath.  She reports increasing abdominal swelling.  No history of paracentesis.  Review of Systems  Positive: Right upper quadrant abdominal pain, shortness of breath Negative: Fever  Physical Exam  BP (!) 172/95 (BP Location: Right Arm)   Pulse 70   Temp 98.2 F (36.8 C)   Resp 19   LMP  (LMP Unknown)   SpO2 100%  Gen:   Awake, no distress   Resp:  Normal effort  MSK:   Moves extremities without difficulty  Other:  Mild abdominal distention, slight fluid wave, right upper quadrant tenderness  Medical Decision Making  Medically screening exam initiated at 7:48 PM.  Appropriate orders placed.  [name] was informed that the remainder of the evaluation will be completed by another provider, this initial triage assessment does not replace that evaluation, and the importance of remaining in the ED until their evaluation is complete.     Desiderio Chew, PA-C 06/10/24 1950

## 2024-06-10 NOTE — ED Notes (Signed)
 Pt attempted to obtain urine was unable to at this time

## 2024-06-10 NOTE — ED Triage Notes (Signed)
 RUQ pain x 4 days. Hx of cirrhosis of liver. Vomiting past few days but not today.

## 2024-06-10 NOTE — ED Provider Notes (Signed)
 [facility] Provider Note   CSN: [id number] Arrival date & time: 06/10/24  [id number]     Patient presents with: Abdominal Pain   [name] is a 53 y.o. female.  {Add pertinent medical, surgical, social history, OB history to YEP:67052} The history is provided by the patient and medical records.  Abdominal Pain  53 year old female with history of alcohol  abuse, cirrhosis, hyperlipidemia, Strachan's syndrome, tobacco abuse, ovarian cyst, anxiety, depression, presenting to the ED with abdominal pain.  Patient reports this has been ongoing since Thursday, progressively worsening since onset.  Pain mostly localized to her right upper abdomen.  She is status postcholecystectomy last year.  She does report nausea and vomiting most mornings upon waking, has been a little worse here recently.  She has still been able to eat, but meals are small and she gets full very quickly.  She is tolerating fluids.  She has had bowel movements.  Denies any fever or chills.  Prior to Admission medications   Medication Sig Start Date End Date Taking? Authorizing Provider  amLODipine  (NORVASC ) 5 MG tablet TAKE 1 TABLET (5 MG TOTAL) BY MOUTH DAILY. 02/06/24   Newlin, Enobong, MD  buPROPion  (WELLBUTRIN  SR) 200 MG 12 hr tablet TAKE 1 TABLET BY MOUTH TWICE A DAY 04/08/24   Newlin, Enobong, MD  busPIRone  (BUSPAR ) 30 MG tablet TAKE 1 TABLET (30 MG TOTAL) BY MOUTH IN THE MORNING AND AT BEDTIME. 12/06/23   Newlin, Enobong, MD  cephALEXin  (KEFLEX ) 500 MG capsule Take 1 capsule (500 mg total) by mouth 3 (three) times daily. Patient not taking: Reported on 04/23/2024 12/25/23   Gershon Donnice SAUNDERS, [name]  Cyanocobalamin  (B-12 PO) Take 1 tablet by mouth daily.    [provider]  ezetimibe  (ZETIA ) 10 MG tablet TAKE 1 TABLET BY MOUTH EVERY DAY 02/06/24   Newlin, Enobong, MD  FOLIC ACID  PO Take 1 capsule by mouth daily.    [provider]  Multiple Vitamins-Minerals  (MULTIVITAMIN WITH MINERALS) tablet Take 1 tablet by mouth daily.    [provider]  omeprazole  (PRILOSEC) 20 MG capsule TAKE 1 CAPSULE BY MOUTH EVERY DAY 04/08/24   Newlin, Enobong, MD  pregabalin  (LYRICA ) 150 MG capsule TAKE 1 CAPSULE BY MOUTH THREE TIMES DAILY 03/07/24   Patel, Donika K, DO  Thiamine  HCl (B-1 PO) Take 1 capsule by mouth daily.    [provider]  traZODone  (DESYREL ) 150 MG tablet Take 1 tablet (150 mg total) by mouth at bedtime. 09/20/23   Newlin, Enobong, MD    Allergies: Cymbalta  [duloxetine  hcl] and Macrobid  [nitrofurantoin  macrocrystal]    Review of Systems  Gastrointestinal:  Positive for abdominal pain.  All other systems reviewed and are negative.   Updated Vital Signs BP (!) 158/99 (BP Location: Right Arm)   Pulse 75   Temp (!) 97.3 F (36.3 C) (Oral)   Resp 14   LMP  (LMP Unknown)   SpO2 100%   Physical Exam Vitals and nursing note reviewed.  Constitutional:      Appearance: She is well-developed.  HENT:     Head: Normocephalic and atraumatic.  Eyes:     Conjunctiva/sclera: Conjunctivae normal.     Pupils: Pupils are equal, round, and reactive to light.  Cardiovascular:     Rate and Rhythm: Normal rate and regular rhythm.     Heart sounds: Normal heart sounds.  Pulmonary:     Effort: Pulmonary effort is normal.     Breath  sounds: Normal breath sounds.  Abdominal:     General: Bowel sounds are normal.     Palpations: Abdomen is soft.     Tenderness: There is abdominal tenderness in the right upper quadrant.     Comments: Abdomen appears somewhat distended, appears to have some fluid buildup, tender RUQ without murphy's sign  Musculoskeletal:        General: Normal range of motion.     Cervical back: Normal range of motion.  Skin:    General: Skin is warm and dry.  Neurological:     Mental Status: She is alert and oriented to person, place, and time.     (all labs ordered are listed, but only abnormal results are  displayed) Labs Reviewed  CBC WITH DIFFERENTIAL/PLATELET - Abnormal; Notable for the following components:      Result Value   Hemoglobin 15.3 (*)    MCV 103.6 (*)    MCH 36.3 (*)    All other components within normal limits  COMPREHENSIVE METABOLIC PANEL WITH GFR - Abnormal; Notable for the following components:   Chloride 93 (*)    BUN <5 (*)    AST 100 (*)    ALT 53 (*)    Alkaline Phosphatase 142 (*)    All other components within normal limits  URINALYSIS, ROUTINE W REFLEX MICROSCOPIC - Abnormal; Notable for the following components:   Color, Urine STRAW (*)    Specific Gravity, Urine 1.004 (*)    Ketones, ur 5 (*)    All other components within normal limits  AMMONIA    EKG: None  Radiology: DG Chest 2 View Result Date: 06/10/2024 CLINICAL DATA:  Shortness of breath EXAM: CHEST - 2 VIEW COMPARISON:  12/08/2021 FINDINGS: The heart size and mediastinal contours are within normal limits. Both lungs are clear. The visualized skeletal structures are unremarkable. IMPRESSION: No active cardiopulmonary disease. Electronically Signed   By: Luke Bun M.D.   On: 06/10/2024 20:45    {Document cardiac monitor, telemetry assessment procedure when appropriate:32947} Procedures   Medications Ordered in the ED  morphine  (PF) 4 MG/ML injection 4 mg (has no administration in time range)      {Click here for ABCD2, HEART and other calculators REFRESH Note before signing:1}                              Medical Decision Making Amount and/or Complexity of Data Reviewed Radiology: ordered.  Risk Prescription drug management.   ***  {Document critical care time when appropriate  Document review of labs and clinical decision tools ie CHADS2VASC2, etc  Document your independent review of radiology images and any outside records  Document your discussion with family members, caretakers and with consultants  Document social determinants of health affecting pt's care  Document  your decision making why or why not admission, treatments were needed:32947:::1}   Final diagnoses:  None    ED Discharge Orders     None

## 2024-06-11 ENCOUNTER — Other Ambulatory Visit: Payer: Self-pay | Admitting: Family Medicine

## 2024-06-11 DIAGNOSIS — F419 Anxiety disorder, unspecified: Secondary | ICD-10-CM

## 2024-06-11 NOTE — ED Notes (Signed)
Pt ambulated to the restroom with a stable gait

## 2024-06-11 NOTE — Discharge Instructions (Signed)
 CT did not show any acute findings.  No significant fluid build up that would warrant drainage today. Follow-up with liver specialist as scheduled.  Can see your primary care doctor in the interim. Please return here for any new or acute changes--significantly worsening distention, shortness of breath, fever, etc.

## 2024-07-01 ENCOUNTER — Encounter: Payer: Self-pay | Admitting: Family Medicine

## 2024-07-01 ENCOUNTER — Other Ambulatory Visit: Payer: Self-pay | Admitting: Family Medicine

## 2024-07-01 MED ORDER — LEVOCETIRIZINE DIHYDROCHLORIDE 5 MG PO TABS: 5.0000 mg | ORAL_TABLET | Freq: Every evening | ORAL | 0 refills | Status: DC

## 2024-07-01 MED ORDER — FLUTICASONE PROPIONATE 50 MCG/ACT NA SUSP: 2.0000 | Freq: Every day | NASAL | 1 refills | Status: DC

## 2024-07-10 ENCOUNTER — Other Ambulatory Visit: Payer: Self-pay | Admitting: Family Medicine

## 2024-07-10 DIAGNOSIS — F4312 Post-traumatic stress disorder, chronic: Secondary | ICD-10-CM

## 2024-07-10 MED ORDER — TRAZODONE HCL 150 MG PO TABS
150.0000 mg | ORAL_TABLET | Freq: Every day | ORAL | 1 refills | Status: AC
Start: 2024-07-10 — End: ?

## 2024-07-23 ENCOUNTER — Other Ambulatory Visit: Payer: Self-pay | Admitting: Family Medicine

## 2024-07-31 NOTE — Progress Notes (Signed)
 Contacted patient at the request of Stefano Samples, MSW, and Dr. Zamor, discussed the need for resources patient states she prefers to have this information via the mail. An additional copy has been sent to the patients email that she provided. Pt voiced understanding and has no further questions at this time.  [name], RMA

## 2024-08-02 ENCOUNTER — Encounter: Payer: Self-pay | Admitting: Oncology

## 2024-08-03 ENCOUNTER — Other Ambulatory Visit: Payer: Self-pay | Admitting: Podiatry

## 2024-08-05 NOTE — Telephone Encounter (Signed)
 Can you call to see if she needs the antibiotic? She has not been seen since April. Thanks!

## 2024-08-12 ENCOUNTER — Other Ambulatory Visit: Payer: Self-pay | Admitting: Family Medicine

## 2024-08-12 DIAGNOSIS — K219 Gastro-esophageal reflux disease without esophagitis: Secondary | ICD-10-CM

## 2024-09-03 ENCOUNTER — Other Ambulatory Visit: Payer: Self-pay | Admitting: Family Medicine

## 2024-09-15 ENCOUNTER — Other Ambulatory Visit: Payer: Self-pay | Admitting: Neurology

## 2024-09-16 ENCOUNTER — Encounter: Payer: Self-pay | Admitting: Neurology

## 2024-10-06 ENCOUNTER — Other Ambulatory Visit: Payer: Self-pay | Admitting: Family Medicine

## 2024-10-10 ENCOUNTER — Other Ambulatory Visit: Payer: Self-pay | Admitting: Family Medicine

## 2024-10-10 ENCOUNTER — Encounter: Payer: Self-pay | Admitting: Family Medicine

## 2024-10-10 DIAGNOSIS — K219 Gastro-esophageal reflux disease without esophagitis: Secondary | ICD-10-CM

## 2024-10-10 MED ORDER — OMEPRAZOLE 20 MG PO CPDR: 20.0000 mg | DELAYED_RELEASE_CAPSULE | Freq: Two times a day (BID) | ORAL | 1 refills | Status: AC

## 2024-11-25 ENCOUNTER — Ambulatory Visit: Payer: MEDICAID | Admitting: Family Medicine

## 2025-03-31 ENCOUNTER — Ambulatory Visit: Payer: MEDICAID | Admitting: Neurology
